# Patient Record
Sex: Male | Born: 1940 | Race: White | Hispanic: No | Marital: Married | Smoking: Never smoker
Health system: Southern US, Community
[De-identification: ages and names within clinical notes are randomized; demographics above are authoritative.]

## PROBLEM LIST (undated history)

## (undated) DIAGNOSIS — Z87442 Personal history of urinary calculi: Secondary | ICD-10-CM

## (undated) DIAGNOSIS — I10 Essential (primary) hypertension: Secondary | ICD-10-CM

## (undated) DIAGNOSIS — M1990 Unspecified osteoarthritis, unspecified site: Secondary | ICD-10-CM

## (undated) DIAGNOSIS — E119 Type 2 diabetes mellitus without complications: Secondary | ICD-10-CM

## (undated) DIAGNOSIS — I499 Cardiac arrhythmia, unspecified: Secondary | ICD-10-CM

## (undated) HISTORY — PX: TONSILLECTOMY: SUR1361

## (undated) HISTORY — PX: EYE SURGERY: SHX253

---

## 2016-03-25 DIAGNOSIS — E1165 Type 2 diabetes mellitus with hyperglycemia: Secondary | ICD-10-CM | POA: Diagnosis not present

## 2016-03-25 DIAGNOSIS — E785 Hyperlipidemia, unspecified: Secondary | ICD-10-CM | POA: Diagnosis not present

## 2016-03-25 DIAGNOSIS — I10 Essential (primary) hypertension: Secondary | ICD-10-CM | POA: Diagnosis not present

## 2016-04-01 DIAGNOSIS — E785 Hyperlipidemia, unspecified: Secondary | ICD-10-CM | POA: Diagnosis not present

## 2016-04-01 DIAGNOSIS — I10 Essential (primary) hypertension: Secondary | ICD-10-CM | POA: Diagnosis not present

## 2016-04-01 DIAGNOSIS — E1165 Type 2 diabetes mellitus with hyperglycemia: Secondary | ICD-10-CM | POA: Diagnosis not present

## 2016-07-15 DIAGNOSIS — M2242 Chondromalacia patellae, left knee: Secondary | ICD-10-CM | POA: Diagnosis not present

## 2016-07-15 DIAGNOSIS — M170 Bilateral primary osteoarthritis of knee: Secondary | ICD-10-CM | POA: Diagnosis not present

## 2016-07-15 DIAGNOSIS — M2241 Chondromalacia patellae, right knee: Secondary | ICD-10-CM | POA: Diagnosis not present

## 2016-07-17 DIAGNOSIS — E1165 Type 2 diabetes mellitus with hyperglycemia: Secondary | ICD-10-CM | POA: Diagnosis not present

## 2016-07-17 DIAGNOSIS — R42 Dizziness and giddiness: Secondary | ICD-10-CM | POA: Diagnosis not present

## 2016-07-17 DIAGNOSIS — I10 Essential (primary) hypertension: Secondary | ICD-10-CM | POA: Diagnosis not present

## 2016-07-31 DIAGNOSIS — E785 Hyperlipidemia, unspecified: Secondary | ICD-10-CM | POA: Diagnosis not present

## 2016-07-31 DIAGNOSIS — E1165 Type 2 diabetes mellitus with hyperglycemia: Secondary | ICD-10-CM | POA: Diagnosis not present

## 2016-07-31 DIAGNOSIS — Z23 Encounter for immunization: Secondary | ICD-10-CM | POA: Diagnosis not present

## 2016-07-31 DIAGNOSIS — R8299 Other abnormal findings in urine: Secondary | ICD-10-CM | POA: Diagnosis not present

## 2016-07-31 DIAGNOSIS — R319 Hematuria, unspecified: Secondary | ICD-10-CM | POA: Diagnosis not present

## 2016-07-31 DIAGNOSIS — Z7189 Other specified counseling: Secondary | ICD-10-CM | POA: Diagnosis not present

## 2016-07-31 DIAGNOSIS — Z0000 Encounter for general adult medical examination without abnormal findings: Secondary | ICD-10-CM | POA: Diagnosis not present

## 2016-07-31 DIAGNOSIS — Z125 Encounter for screening for malignant neoplasm of prostate: Secondary | ICD-10-CM | POA: Diagnosis not present

## 2016-07-31 DIAGNOSIS — I10 Essential (primary) hypertension: Secondary | ICD-10-CM | POA: Diagnosis not present

## 2016-08-07 DIAGNOSIS — D72829 Elevated white blood cell count, unspecified: Secondary | ICD-10-CM | POA: Diagnosis not present

## 2016-08-12 DIAGNOSIS — M2241 Chondromalacia patellae, right knee: Secondary | ICD-10-CM | POA: Diagnosis not present

## 2016-08-12 DIAGNOSIS — M170 Bilateral primary osteoarthritis of knee: Secondary | ICD-10-CM | POA: Diagnosis not present

## 2016-08-12 DIAGNOSIS — M2242 Chondromalacia patellae, left knee: Secondary | ICD-10-CM | POA: Diagnosis not present

## 2016-08-19 DIAGNOSIS — M170 Bilateral primary osteoarthritis of knee: Secondary | ICD-10-CM | POA: Diagnosis not present

## 2016-08-19 DIAGNOSIS — M2242 Chondromalacia patellae, left knee: Secondary | ICD-10-CM | POA: Diagnosis not present

## 2016-08-19 DIAGNOSIS — M2241 Chondromalacia patellae, right knee: Secondary | ICD-10-CM | POA: Diagnosis not present

## 2016-08-26 DIAGNOSIS — M2241 Chondromalacia patellae, right knee: Secondary | ICD-10-CM | POA: Diagnosis not present

## 2016-08-26 DIAGNOSIS — M2242 Chondromalacia patellae, left knee: Secondary | ICD-10-CM | POA: Diagnosis not present

## 2016-08-26 DIAGNOSIS — M170 Bilateral primary osteoarthritis of knee: Secondary | ICD-10-CM | POA: Diagnosis not present

## 2016-10-07 DIAGNOSIS — M2242 Chondromalacia patellae, left knee: Secondary | ICD-10-CM | POA: Diagnosis not present

## 2016-10-07 DIAGNOSIS — M2241 Chondromalacia patellae, right knee: Secondary | ICD-10-CM | POA: Diagnosis not present

## 2016-10-07 DIAGNOSIS — M170 Bilateral primary osteoarthritis of knee: Secondary | ICD-10-CM | POA: Diagnosis not present

## 2016-10-30 DIAGNOSIS — E1165 Type 2 diabetes mellitus with hyperglycemia: Secondary | ICD-10-CM | POA: Diagnosis not present

## 2016-10-30 DIAGNOSIS — I10 Essential (primary) hypertension: Secondary | ICD-10-CM | POA: Diagnosis not present

## 2016-10-30 DIAGNOSIS — E785 Hyperlipidemia, unspecified: Secondary | ICD-10-CM | POA: Diagnosis not present

## 2016-11-11 DIAGNOSIS — E1165 Type 2 diabetes mellitus with hyperglycemia: Secondary | ICD-10-CM | POA: Diagnosis not present

## 2016-11-11 DIAGNOSIS — I10 Essential (primary) hypertension: Secondary | ICD-10-CM | POA: Diagnosis not present

## 2016-11-11 DIAGNOSIS — E785 Hyperlipidemia, unspecified: Secondary | ICD-10-CM | POA: Diagnosis not present

## 2017-03-05 DIAGNOSIS — E1165 Type 2 diabetes mellitus with hyperglycemia: Secondary | ICD-10-CM | POA: Diagnosis not present

## 2017-03-05 DIAGNOSIS — E785 Hyperlipidemia, unspecified: Secondary | ICD-10-CM | POA: Diagnosis not present

## 2017-03-05 DIAGNOSIS — I10 Essential (primary) hypertension: Secondary | ICD-10-CM | POA: Diagnosis not present

## 2017-04-30 DIAGNOSIS — G8929 Other chronic pain: Secondary | ICD-10-CM | POA: Diagnosis not present

## 2017-04-30 DIAGNOSIS — M2341 Loose body in knee, right knee: Secondary | ICD-10-CM | POA: Diagnosis not present

## 2017-04-30 DIAGNOSIS — M170 Bilateral primary osteoarthritis of knee: Secondary | ICD-10-CM | POA: Diagnosis not present

## 2017-04-30 DIAGNOSIS — M2342 Loose body in knee, left knee: Secondary | ICD-10-CM | POA: Diagnosis not present

## 2017-05-20 DIAGNOSIS — I10 Essential (primary) hypertension: Secondary | ICD-10-CM | POA: Diagnosis not present

## 2017-05-20 DIAGNOSIS — Z9181 History of falling: Secondary | ICD-10-CM | POA: Diagnosis not present

## 2017-05-20 DIAGNOSIS — M170 Bilateral primary osteoarthritis of knee: Secondary | ICD-10-CM | POA: Diagnosis not present

## 2017-05-20 DIAGNOSIS — Z1389 Encounter for screening for other disorder: Secondary | ICD-10-CM | POA: Diagnosis not present

## 2017-05-20 DIAGNOSIS — E119 Type 2 diabetes mellitus without complications: Secondary | ICD-10-CM | POA: Diagnosis not present

## 2017-05-20 DIAGNOSIS — Z800 Family history of malignant neoplasm of digestive organs: Secondary | ICD-10-CM | POA: Diagnosis not present

## 2017-05-20 DIAGNOSIS — E785 Hyperlipidemia, unspecified: Secondary | ICD-10-CM | POA: Diagnosis not present

## 2017-05-20 DIAGNOSIS — Z6831 Body mass index (BMI) 31.0-31.9, adult: Secondary | ICD-10-CM | POA: Diagnosis not present

## 2017-05-22 DIAGNOSIS — E119 Type 2 diabetes mellitus without complications: Secondary | ICD-10-CM | POA: Diagnosis not present

## 2017-05-22 DIAGNOSIS — Z125 Encounter for screening for malignant neoplasm of prostate: Secondary | ICD-10-CM | POA: Diagnosis not present

## 2017-05-22 DIAGNOSIS — E785 Hyperlipidemia, unspecified: Secondary | ICD-10-CM | POA: Diagnosis not present

## 2017-06-10 DIAGNOSIS — H18893 Other specified disorders of cornea, bilateral: Secondary | ICD-10-CM | POA: Diagnosis not present

## 2017-06-10 DIAGNOSIS — Z961 Presence of intraocular lens: Secondary | ICD-10-CM | POA: Diagnosis not present

## 2017-06-10 DIAGNOSIS — E113293 Type 2 diabetes mellitus with mild nonproliferative diabetic retinopathy without macular edema, bilateral: Secondary | ICD-10-CM | POA: Diagnosis not present

## 2017-06-23 DIAGNOSIS — E119 Type 2 diabetes mellitus without complications: Secondary | ICD-10-CM | POA: Diagnosis not present

## 2017-06-23 DIAGNOSIS — Z1211 Encounter for screening for malignant neoplasm of colon: Secondary | ICD-10-CM | POA: Diagnosis not present

## 2017-06-23 DIAGNOSIS — K529 Noninfective gastroenteritis and colitis, unspecified: Secondary | ICD-10-CM | POA: Diagnosis not present

## 2017-06-23 DIAGNOSIS — Z79899 Other long term (current) drug therapy: Secondary | ICD-10-CM | POA: Diagnosis not present

## 2017-06-23 DIAGNOSIS — D122 Benign neoplasm of ascending colon: Secondary | ICD-10-CM | POA: Diagnosis not present

## 2017-06-23 DIAGNOSIS — I10 Essential (primary) hypertension: Secondary | ICD-10-CM | POA: Diagnosis not present

## 2017-06-23 DIAGNOSIS — Z800 Family history of malignant neoplasm of digestive organs: Secondary | ICD-10-CM | POA: Diagnosis not present

## 2017-08-24 DIAGNOSIS — E1165 Type 2 diabetes mellitus with hyperglycemia: Secondary | ICD-10-CM | POA: Diagnosis not present

## 2017-08-24 DIAGNOSIS — I10 Essential (primary) hypertension: Secondary | ICD-10-CM | POA: Diagnosis not present

## 2017-08-24 DIAGNOSIS — Z6831 Body mass index (BMI) 31.0-31.9, adult: Secondary | ICD-10-CM | POA: Diagnosis not present

## 2017-08-24 DIAGNOSIS — M170 Bilateral primary osteoarthritis of knee: Secondary | ICD-10-CM | POA: Diagnosis not present

## 2017-08-24 DIAGNOSIS — E785 Hyperlipidemia, unspecified: Secondary | ICD-10-CM | POA: Diagnosis not present

## 2017-08-24 DIAGNOSIS — Z9181 History of falling: Secondary | ICD-10-CM | POA: Diagnosis not present

## 2017-09-04 DIAGNOSIS — H26493 Other secondary cataract, bilateral: Secondary | ICD-10-CM | POA: Diagnosis not present

## 2017-09-07 DIAGNOSIS — I10 Essential (primary) hypertension: Secondary | ICD-10-CM | POA: Diagnosis not present

## 2017-09-07 DIAGNOSIS — Z6831 Body mass index (BMI) 31.0-31.9, adult: Secondary | ICD-10-CM | POA: Diagnosis not present

## 2017-09-07 DIAGNOSIS — Z23 Encounter for immunization: Secondary | ICD-10-CM | POA: Diagnosis not present

## 2017-09-15 ENCOUNTER — Other Ambulatory Visit: Payer: Self-pay | Admitting: Orthopedic Surgery

## 2017-10-05 DIAGNOSIS — I10 Essential (primary) hypertension: Secondary | ICD-10-CM | POA: Diagnosis not present

## 2017-10-05 DIAGNOSIS — Z6831 Body mass index (BMI) 31.0-31.9, adult: Secondary | ICD-10-CM | POA: Diagnosis not present

## 2017-10-26 DIAGNOSIS — I10 Essential (primary) hypertension: Secondary | ICD-10-CM | POA: Diagnosis not present

## 2017-11-03 DIAGNOSIS — Z125 Encounter for screening for malignant neoplasm of prostate: Secondary | ICD-10-CM | POA: Diagnosis not present

## 2017-11-03 DIAGNOSIS — Z0000 Encounter for general adult medical examination without abnormal findings: Secondary | ICD-10-CM | POA: Diagnosis not present

## 2017-11-03 DIAGNOSIS — E785 Hyperlipidemia, unspecified: Secondary | ICD-10-CM | POA: Diagnosis not present

## 2017-11-03 DIAGNOSIS — Z6830 Body mass index (BMI) 30.0-30.9, adult: Secondary | ICD-10-CM | POA: Diagnosis not present

## 2017-11-03 DIAGNOSIS — Z136 Encounter for screening for cardiovascular disorders: Secondary | ICD-10-CM | POA: Diagnosis not present

## 2017-11-03 DIAGNOSIS — Z9181 History of falling: Secondary | ICD-10-CM | POA: Diagnosis not present

## 2017-11-03 DIAGNOSIS — Z23 Encounter for immunization: Secondary | ICD-10-CM | POA: Diagnosis not present

## 2017-11-03 DIAGNOSIS — Z1331 Encounter for screening for depression: Secondary | ICD-10-CM | POA: Diagnosis not present

## 2017-11-16 ENCOUNTER — Other Ambulatory Visit (HOSPITAL_COMMUNITY): Payer: Self-pay | Admitting: *Deleted

## 2017-11-16 NOTE — Pre-Procedure Instructions (Signed)
Johnathan Barker  11/16/2017    Your procedure is scheduled on Monday, November 30, 2017 at 8:15 AM.   Report to [facility] Entrance "A" Admitting Office at 6:15 AM.   Call this number if you have problems the morning of surgery: [phone]   Questions prior to day of surgery, please call [phone] between 8 & 4 PM.   Remember:  Do not eat food or drink liquids after midnight Sunday, 11/29/17.  Take these medicines the morning of surgery with A SIP OF WATER: Amlodipine (Norvasc), Metoprolol (Toprol XL)  Stop NSAIDS (Ibuprofen, Aleve, etc), Fish Oil and Multivitamins 7 days prior to surgery. Do not use Aspirin products 7 days prior to surgery.  Do not take your Metformin and Glimepiride the morning of surgery.   How to Manage Your Diabetes Before Surgery   Why is it important to control my blood sugar before and after surgery?   Improving blood sugar levels before and after surgery helps healing and can limit problems.  A way of improving blood sugar control is eating a healthy diet by:  - Eating less sugar and carbohydrates  - Increasing activity/exercise  - Talk with your doctor about reaching your blood sugar goals  High blood sugars (greater than 180 mg/dL) can raise your risk of infections and slow down your recovery so you will need to focus on controlling your diabetes during the weeks before surgery.  Make sure that the doctor who takes care of your diabetes knows about your planned surgery including the date and location.  How do I manage my blood sugars before surgery?   Check your blood sugar at least 4 times a day, 2 days before surgery to make sure that they are not too high or low.  Check your blood sugar the morning of your surgery when you wake up and every 2 hours until you get to the Short-Stay unit.  Treat a low blood sugar (less than 70 mg/dL) with 1/2 cup of clear juice (cranberry or apple), 4 glucose tablets, OR glucose gel.  Recheck blood  sugar in 15 minutes after treatment (to make sure it is greater than 70 mg/dL).  If blood sugar is not greater than 70 mg/dL on re-check, call [phone] for further instructions.   Report your blood sugar to the Short-Stay nurse when you get to Short-Stay.  References:  [facility], 2007 "How to Manage your Diabetes Before and After Surgery".   Do not wear jewelry.  Do not wear lotions, powders, cologne or deodorant.  Men may shave face and neck.  Do not bring valuables to the hospital.  [facility] is not responsible for any belongings or valuables.  Contacts, dentures or bridgework may not be worn into surgery.  Leave your suitcase in the car.  After surgery it may be brought to your room.  For patients admitted to the hospital, discharge time will be determined by your treatment team.  [facility] - Preparing for Surgery  Before surgery, you can play an important role.  Because skin is not sterile, your skin needs to be as free of germs as possible.  You can reduce the number of germs on you skin by washing with CHG (chlorahexidine gluconate) soap before surgery.  CHG is an antiseptic cleaner which kills germs and bonds with the skin to continue killing germs even after washing.  Please DO NOT use if you have an allergy to CHG or antibacterial soaps.  If your  skin becomes reddened/irritated stop using the CHG and inform your nurse when you arrive at Short Stay.  Do not shave (including legs and underarms) for at least 48 hours prior to the first CHG shower.  You may shave your face.  Please follow these instructions carefully:   1.  Shower with CHG Soap the night before surgery and the                    morning of Surgery.  2.  If you choose to wash your hair, wash your hair first as usual with your       normal shampoo.  3.  After you shampoo, rinse your hair and body thoroughly to remove the shampoo.  4.  Use CHG as you would any other liquid soap.   You can apply chg directly       to the skin and wash gently with scrungie or a clean washcloth.  5.  Apply the CHG Soap to your body ONLY FROM THE NECK DOWN.        Do not use on open wounds or open sores.  Avoid contact with your eyes, ears, mouth and genitals (private parts).  Wash genitals (private parts) with your normal soap.  6.  Wash thoroughly, paying special attention to the area where your surgery        will be performed.  7.  Thoroughly rinse your body with warm water from the neck down.  8.  DO NOT shower/wash with your normal soap after using and rinsing off       the CHG Soap.  9.  Pat yourself dry with a clean towel.            10.  Wear clean pajamas.            11.  Place clean sheets on your bed the night of your first shower and do not        sleep with pets.  Day of Surgery  Do not apply any lotions/deodorants the morning of surgery.  Please wear clean clothes to the hospital.   Please read over the fact sheets that you were given.

## 2017-11-18 ENCOUNTER — Encounter (HOSPITAL_COMMUNITY): Payer: Self-pay

## 2017-11-18 ENCOUNTER — Encounter (HOSPITAL_COMMUNITY)
Admission: RE | Admit: 2017-11-18 | Discharge: 2017-11-18 | Disposition: A | Discharge status: Home / Self Care | Payer: Medicare Other | Source: Ambulatory Visit | Attending: Orthopedic Surgery | Admitting: Orthopedic Surgery

## 2017-11-18 ENCOUNTER — Other Ambulatory Visit: Payer: Self-pay

## 2017-11-18 DIAGNOSIS — M1711 Unilateral primary osteoarthritis, right knee: Secondary | ICD-10-CM | POA: Diagnosis not present

## 2017-11-18 DIAGNOSIS — Z01812 Encounter for preprocedural laboratory examination: Secondary | ICD-10-CM | POA: Diagnosis not present

## 2017-11-18 HISTORY — DX: Unspecified osteoarthritis, unspecified site: M19.90

## 2017-11-18 HISTORY — DX: Personal history of urinary calculi: Z87.442

## 2017-11-18 HISTORY — DX: Cardiac arrhythmia, unspecified: I49.9

## 2017-11-18 HISTORY — DX: Type 2 diabetes mellitus without complications: E11.9

## 2017-11-18 HISTORY — DX: Essential (primary) hypertension: I10

## 2017-11-18 LAB — CBC WITH DIFFERENTIAL/PLATELET
Basophils Absolute: 0 10*3/uL (ref 0.0–0.1)
Basophils Relative: 0 %
EOS PCT: 6 %
Eosinophils Absolute: 0.6 10*3/uL (ref 0.0–0.7)
HCT: 38.7 % — ABNORMAL LOW (ref 39.0–52.0)
HEMOGLOBIN: 13.4 g/dL (ref 13.0–17.0)
LYMPHS ABS: 2.4 10*3/uL (ref 0.7–4.0)
LYMPHS PCT: 25 %
MCH: 30.7 pg (ref 26.0–34.0)
MCHC: 34.6 g/dL (ref 30.0–36.0)
MCV: 88.8 fL (ref 78.0–100.0)
Monocytes Absolute: 0.9 10*3/uL (ref 0.1–1.0)
Monocytes Relative: 9 %
Neutro Abs: 6 10*3/uL (ref 1.7–7.7)
Neutrophils Relative %: 60 %
PLATELETS: 334 10*3/uL (ref 150–400)
RBC: 4.36 MIL/uL (ref 4.22–5.81)
RDW: 12.9 % (ref 11.5–15.5)
WBC: 9.9 10*3/uL (ref 4.0–10.5)

## 2017-11-18 LAB — COMPREHENSIVE METABOLIC PANEL
ALK PHOS: 75 U/L (ref 38–126)
ALT: 24 U/L (ref 17–63)
AST: 25 U/L (ref 15–41)
Albumin: 3.7 g/dL (ref 3.5–5.0)
Anion gap: 10 (ref 5–15)
BUN: 17 mg/dL (ref 6–20)
CALCIUM: 9.1 mg/dL (ref 8.9–10.3)
CO2: 26 mmol/L (ref 22–32)
CREATININE: 0.96 mg/dL (ref 0.61–1.24)
Chloride: 102 mmol/L (ref 101–111)
Glucose, Bld: 245 mg/dL — ABNORMAL HIGH (ref 65–99)
Potassium: 4 mmol/L (ref 3.5–5.1)
Sodium: 138 mmol/L (ref 135–145)
TOTAL PROTEIN: 7.4 g/dL (ref 6.5–8.1)
Total Bilirubin: 1.4 mg/dL — ABNORMAL HIGH (ref 0.3–1.2)

## 2017-11-18 LAB — HEMOGLOBIN A1C
HEMOGLOBIN A1C: 7.6 % — AB (ref 4.8–5.6)
MEAN PLASMA GLUCOSE: 171.42 mg/dL

## 2017-11-18 LAB — SURGICAL PCR SCREEN
MRSA, PCR: NEGATIVE
Staphylococcus aureus: NEGATIVE

## 2017-11-18 LAB — GLUCOSE, CAPILLARY: Glucose-Capillary: 256 mg/dL — ABNORMAL HIGH (ref 65–99)

## 2017-11-18 NOTE — Progress Notes (Signed)
Requesting EKG pt. reports that he had in PCP office- in the past 4-6 months. Pt. Reports that he has not ever had any cardiac concerns, denies stress test etc. In the past. Pt.followed by Dr. Carolanne Grumbling in [facility] group.Chart will be referred to anesth. For his labs values to be reviewed.  Pt. Moved here from [place] in the past year.

## 2017-11-19 NOTE — Progress Notes (Signed)
Anesthesia Chart Review:  Pt is a 76 year old male scheduled for R total knee arthroplasty on 11/30/2016 with Vickey Huger, M.D.  - PCP is Nelda Bucks, MD. Last office visit 10/26/17  PMH includes: HTN, dysrhythmia (unspecified), DM. Never smoker. BMI 30.  Medications include: Amlodipine, Lipitor, glimepiride, losartan-HCTZ, metformin, metoprolol  BP (!) 161/71   Pulse 66   Temp 36.5 C (Oral)   Resp 18   Ht 6' (1.829 m)   Wt 221 lb 9 oz (100.5 kg)   SpO2 99%   BMI 30.05 kg/m   Preoperative labs reviewed.   - HbA1c 7.6, glucose 245  EKG 05/20/17 (Dr. Denton Lank office): Sinus rhythm. Intra-atrial conduction delay  If no changes, I anticipate pt can proceed with surgery as scheduled.    Willeen Cass, FNP-BC [facility]/Anesthesiology Phone: [phone] 11/19/2017 9:51 AM

## 2017-11-27 DIAGNOSIS — Z6830 Body mass index (BMI) 30.0-30.9, adult: Secondary | ICD-10-CM | POA: Diagnosis not present

## 2017-11-27 DIAGNOSIS — E1165 Type 2 diabetes mellitus with hyperglycemia: Secondary | ICD-10-CM | POA: Diagnosis not present

## 2017-11-27 DIAGNOSIS — E785 Hyperlipidemia, unspecified: Secondary | ICD-10-CM | POA: Diagnosis not present

## 2017-11-27 DIAGNOSIS — M170 Bilateral primary osteoarthritis of knee: Secondary | ICD-10-CM | POA: Diagnosis not present

## 2017-11-27 DIAGNOSIS — I10 Essential (primary) hypertension: Secondary | ICD-10-CM | POA: Diagnosis not present

## 2017-11-27 MED ORDER — TRANEXAMIC ACID 1000 MG/10ML IV SOLN
1000.0000 mg | INTRAVENOUS | Status: AC
  Administered 2017-11-30: 1000 mg via INTRAVENOUS
  Filled 2017-11-27: qty 10

## 2017-11-27 MED ORDER — ACETAMINOPHEN 500 MG PO TABS
1000.0000 mg | ORAL_TABLET | Freq: Once | ORAL | Status: AC
  Administered 2017-11-30: 1000 mg via ORAL
  Filled 2017-11-27: qty 2

## 2017-11-27 MED ORDER — DEXAMETHASONE SODIUM PHOSPHATE 10 MG/ML IJ SOLN
8.0000 mg | Freq: Once | INTRAMUSCULAR | Status: DC
  Filled 2017-11-27: qty 1

## 2017-11-27 MED ORDER — GABAPENTIN 300 MG PO CAPS
300.0000 mg | ORAL_CAPSULE | Freq: Once | ORAL | Status: AC
  Administered 2017-11-30: 300 mg via ORAL
  Filled 2017-11-27: qty 1

## 2017-11-29 ENCOUNTER — Encounter (HOSPITAL_COMMUNITY): Payer: Self-pay | Admitting: Anesthesiology

## 2017-11-29 MED ORDER — BUPIVACAINE LIPOSOME 1.3 % IJ SUSP
20.0000 mL | INTRAMUSCULAR | Status: AC
  Administered 2017-11-30: 20 mL
  Filled 2017-11-29 (×2): qty 20

## 2017-11-29 NOTE — Anesthesia Preprocedure Evaluation (Addendum)
Anesthesia Evaluation  Patient identified by MRN, date of birth, ID band Patient awake    Reviewed: Allergy & Precautions, NPO status , Patient's Chart, lab work & pertinent test results  Airway Mallampati: I  TM Distance: >3 FB Neck ROM: Full    Dental  (+) Chipped, Poor Dentition, Dental Advisory Given,    Pulmonary neg pulmonary ROS,    breath sounds clear to auscultation       Cardiovascular hypertension, Pt. on medications and Pt. on home beta blockers + dysrhythmias  Rhythm:Regular Rate:Normal     Neuro/Psych negative neurological ROS  negative psych ROS   GI/Hepatic negative GI ROS, Neg liver ROS,   Endo/Other  diabetes, Type 2, Oral Hypoglycemic Agents  Renal/GU negative Renal ROS  negative genitourinary   Musculoskeletal  (+) Arthritis , Osteoarthritis,    Abdominal Normal abdominal exam  (+)   Peds  Hematology   Anesthesia Other Findings   Reproductive/Obstetrics                          Anesthesia Physical Anesthesia Plan  ASA: II  Anesthesia Plan: Spinal   Post-op Pain Management:  Regional for Post-op pain   Induction: Intravenous  PONV Risk Score and Plan: Ondansetron, Dexamethasone and Propofol infusion  Airway Management Planned: Natural Airway and Nasal Cannula  Additional Equipment: None  Intra-op Plan:   Post-operative Plan:   Informed Consent: I have reviewed the patients History and Physical, chart, labs and discussed the procedure including the risks, benefits and alternatives for the proposed anesthesia with the patient or authorized representative who has indicated his/her understanding and acceptance.     Plan Discussed with: CRNA  Anesthesia Plan Comments:         Anesthesia Quick Evaluation

## 2017-11-30 ENCOUNTER — Observation Stay (HOSPITAL_COMMUNITY)
Admission: RE | Admit: 2017-11-30 | Discharge: 2017-12-01 | Disposition: A | Discharge status: Home / Self Care | Payer: Medicare Other | Source: Ambulatory Visit | Attending: Orthopedic Surgery | Admitting: Orthopedic Surgery

## 2017-11-30 ENCOUNTER — Encounter (HOSPITAL_COMMUNITY)
Admission: RE | Disposition: A | Discharge status: Home / Self Care | Payer: Self-pay | Source: Ambulatory Visit | Attending: Orthopedic Surgery

## 2017-11-30 ENCOUNTER — Encounter (HOSPITAL_COMMUNITY): Payer: Self-pay

## 2017-11-30 ENCOUNTER — Other Ambulatory Visit: Payer: Self-pay

## 2017-11-30 ENCOUNTER — Ambulatory Visit (HOSPITAL_COMMUNITY): Payer: Medicare Other | Admitting: Emergency Medicine

## 2017-11-30 ENCOUNTER — Ambulatory Visit (HOSPITAL_COMMUNITY): Payer: Medicare Other | Admitting: Anesthesiology

## 2017-11-30 DIAGNOSIS — Z87442 Personal history of urinary calculi: Secondary | ICD-10-CM | POA: Diagnosis not present

## 2017-11-30 DIAGNOSIS — Z7982 Long term (current) use of aspirin: Secondary | ICD-10-CM | POA: Diagnosis not present

## 2017-11-30 DIAGNOSIS — Z7984 Long term (current) use of oral hypoglycemic drugs: Secondary | ICD-10-CM | POA: Diagnosis not present

## 2017-11-30 DIAGNOSIS — E119 Type 2 diabetes mellitus without complications: Secondary | ICD-10-CM | POA: Diagnosis not present

## 2017-11-30 DIAGNOSIS — G8918 Other acute postprocedural pain: Secondary | ICD-10-CM | POA: Diagnosis not present

## 2017-11-30 DIAGNOSIS — M1711 Unilateral primary osteoarthritis, right knee: Principal | ICD-10-CM | POA: Insufficient documentation

## 2017-11-30 DIAGNOSIS — Z880 Allergy status to penicillin: Secondary | ICD-10-CM | POA: Diagnosis not present

## 2017-11-30 DIAGNOSIS — Z96659 Presence of unspecified artificial knee joint: Secondary | ICD-10-CM

## 2017-11-30 DIAGNOSIS — Z79899 Other long term (current) drug therapy: Secondary | ICD-10-CM | POA: Insufficient documentation

## 2017-11-30 DIAGNOSIS — I10 Essential (primary) hypertension: Secondary | ICD-10-CM | POA: Insufficient documentation

## 2017-11-30 DIAGNOSIS — I499 Cardiac arrhythmia, unspecified: Secondary | ICD-10-CM | POA: Diagnosis not present

## 2017-11-30 HISTORY — PX: TOTAL KNEE ARTHROPLASTY: SHX125

## 2017-11-30 LAB — GLUCOSE, CAPILLARY
Glucose-Capillary: 91 mg/dL (ref 65–99)
Glucose-Capillary: 100 mg/dL — ABNORMAL HIGH (ref 65–99)
Glucose-Capillary: 296 mg/dL — ABNORMAL HIGH (ref 65–99)

## 2017-11-30 SURGERY — ARTHROPLASTY, KNEE, TOTAL
Anesthesia: Spinal | Site: Knee | Laterality: Right

## 2017-11-30 MED ORDER — ACETAMINOPHEN 325 MG PO TABS: 650.0000 mg | ORAL_TABLET | ORAL | Status: DC | PRN

## 2017-11-30 MED ORDER — MEPERIDINE HCL 25 MG/ML IJ SOLN: 6.2500 mg | INTRAMUSCULAR | Status: DC | PRN

## 2017-11-30 MED ORDER — BUPIVACAINE-EPINEPHRINE (PF) 0.25% -1:200000 IJ SOLN
INTRAMUSCULAR | Status: DC | PRN
  Administered 2017-11-30: 30 mL

## 2017-11-30 MED ORDER — METOPROLOL SUCCINATE ER 50 MG PO TB24
50.0000 mg | ORAL_TABLET | Freq: Every day | ORAL | Status: DC
  Administered 2017-12-01: 50 mg via ORAL
  Filled 2017-11-30: qty 1

## 2017-11-30 MED ORDER — FLEET ENEMA 7-19 GM/118ML RE ENEM: 1.0000 | ENEMA | Freq: Once | RECTAL | Status: DC | PRN

## 2017-11-30 MED ORDER — HYDROCODONE-ACETAMINOPHEN 7.5-325 MG PO TABS
ORAL_TABLET | ORAL | Status: AC
  Filled 2017-11-30: qty 1

## 2017-11-30 MED ORDER — FENTANYL CITRATE (PF) 250 MCG/5ML IJ SOLN
INTRAMUSCULAR | Status: AC
  Filled 2017-11-30: qty 5

## 2017-11-30 MED ORDER — LACTATED RINGERS IV SOLN: INTRAVENOUS | Status: DC

## 2017-11-30 MED ORDER — PROMETHAZINE HCL 25 MG/ML IJ SOLN: 6.2500 mg | INTRAMUSCULAR | Status: DC | PRN

## 2017-11-30 MED ORDER — ATORVASTATIN CALCIUM 10 MG PO TABS
10.0000 mg | ORAL_TABLET | Freq: Every day | ORAL | Status: DC
  Administered 2017-11-30 – 2017-12-01 (×2): 10 mg via ORAL
  Filled 2017-11-30 (×2): qty 1

## 2017-11-30 MED ORDER — DEXAMETHASONE SODIUM PHOSPHATE 10 MG/ML IJ SOLN
10.0000 mg | Freq: Once | INTRAMUSCULAR | Status: AC
  Administered 2017-12-01: 10 mg via INTRAVENOUS
  Filled 2017-11-30: qty 1

## 2017-11-30 MED ORDER — DEXAMETHASONE SODIUM PHOSPHATE 10 MG/ML IJ SOLN
INTRAMUSCULAR | Status: DC | PRN
  Administered 2017-11-30: 10 mg via INTRAVENOUS

## 2017-11-30 MED ORDER — LOSARTAN POTASSIUM-HCTZ 100-25 MG PO TABS: 1.0000 | ORAL_TABLET | Freq: Every day | ORAL | Status: DC

## 2017-11-30 MED ORDER — METHOCARBAMOL 500 MG PO TABS
500.0000 mg | ORAL_TABLET | Freq: Four times a day (QID) | ORAL | Status: DC | PRN
Start: 2017-11-30 — End: 2017-12-01
  Administered 2017-11-30: 500 mg via ORAL

## 2017-11-30 MED ORDER — GABAPENTIN 300 MG PO CAPS
300.0000 mg | ORAL_CAPSULE | Freq: Three times a day (TID) | ORAL | Status: DC
  Administered 2017-11-30 – 2017-12-01 (×2): 300 mg via ORAL
  Filled 2017-11-30 (×2): qty 1

## 2017-11-30 MED ORDER — METOCLOPRAMIDE HCL 5 MG PO TABS: 5.0000 mg | ORAL_TABLET | Freq: Three times a day (TID) | ORAL | Status: DC | PRN

## 2017-11-30 MED ORDER — LOSARTAN POTASSIUM 50 MG PO TABS
100.0000 mg | ORAL_TABLET | Freq: Every day | ORAL | Status: DC
  Administered 2017-11-30 – 2017-12-01 (×2): 100 mg via ORAL
  Filled 2017-11-30 (×2): qty 2

## 2017-11-30 MED ORDER — PHENOL 1.4 % MT LIQD: 1.0000 | OROMUCOSAL | Status: DC | PRN

## 2017-11-30 MED ORDER — ONDANSETRON HCL 4 MG PO TABS
4.0000 mg | ORAL_TABLET | Freq: Four times a day (QID) | ORAL | Status: DC | PRN
Start: 2017-11-30 — End: 2017-12-01

## 2017-11-30 MED ORDER — ONDANSETRON HCL 4 MG/2ML IJ SOLN
INTRAMUSCULAR | Status: AC
  Filled 2017-11-30: qty 2

## 2017-11-30 MED ORDER — ASPIRIN EC 325 MG PO TBEC
325.0000 mg | DELAYED_RELEASE_TABLET | Freq: Two times a day (BID) | ORAL | Status: DC
  Administered 2017-11-30 – 2017-12-01 (×2): 325 mg via ORAL
  Filled 2017-11-30 (×2): qty 1

## 2017-11-30 MED ORDER — PROPOFOL 10 MG/ML IV BOLUS
INTRAVENOUS | Status: AC
Start: 2017-11-30 — End: ?
  Filled 2017-11-30: qty 20

## 2017-11-30 MED ORDER — LIDOCAINE 2% (20 MG/ML) 5 ML SYRINGE
INTRAMUSCULAR | Status: AC
  Filled 2017-11-30: qty 5

## 2017-11-30 MED ORDER — AMLODIPINE BESYLATE 5 MG PO TABS
5.0000 mg | ORAL_TABLET | Freq: Every day | ORAL | Status: DC
  Administered 2017-12-01: 5 mg via ORAL
  Filled 2017-11-30 (×2): qty 1

## 2017-11-30 MED ORDER — SODIUM CHLORIDE 0.9 % IR SOLN
Status: DC | PRN
  Administered 2017-11-30: 3000 mL

## 2017-11-30 MED ORDER — BISACODYL 5 MG PO TBEC: 5.0000 mg | DELAYED_RELEASE_TABLET | Freq: Every day | ORAL | Status: DC | PRN

## 2017-11-30 MED ORDER — ONDANSETRON HCL 4 MG/2ML IJ SOLN: 4.0000 mg | Freq: Four times a day (QID) | INTRAMUSCULAR | Status: DC | PRN

## 2017-11-30 MED ORDER — HYDROMORPHONE HCL 1 MG/ML IJ SOLN: 0.2500 mg | INTRAMUSCULAR | Status: DC | PRN

## 2017-11-30 MED ORDER — PROPOFOL 500 MG/50ML IV EMUL
INTRAVENOUS | Status: DC | PRN
  Administered 2017-11-30: 75 ug/kg/min via INTRAVENOUS

## 2017-11-30 MED ORDER — DOCUSATE SODIUM 100 MG PO CAPS
100.0000 mg | ORAL_CAPSULE | Freq: Two times a day (BID) | ORAL | Status: DC
  Administered 2017-11-30 – 2017-12-01 (×2): 100 mg via ORAL
  Filled 2017-11-30 (×2): qty 1

## 2017-11-30 MED ORDER — HYDROCODONE-ACETAMINOPHEN 7.5-325 MG PO TABS
1.0000 | ORAL_TABLET | Freq: Four times a day (QID) | ORAL | Status: DC
  Administered 2017-11-30 – 2017-12-01 (×5): 1 via ORAL
  Filled 2017-11-30 (×4): qty 1

## 2017-11-30 MED ORDER — CHLORHEXIDINE GLUCONATE 4 % EX LIQD: 60.0000 mL | Freq: Once | CUTANEOUS | Status: DC

## 2017-11-30 MED ORDER — METFORMIN HCL 500 MG PO TABS
1000.0000 mg | ORAL_TABLET | Freq: Two times a day (BID) | ORAL | Status: DC
  Administered 2017-11-30 – 2017-12-01 (×2): 1000 mg via ORAL
  Filled 2017-11-30 (×2): qty 2

## 2017-11-30 MED ORDER — CLINDAMYCIN PHOSPHATE 600 MG/50ML IV SOLN
600.0000 mg | Freq: Four times a day (QID) | INTRAVENOUS | Status: AC
  Administered 2017-11-30 – 2017-12-01 (×2): 600 mg via INTRAVENOUS
  Filled 2017-11-30 (×2): qty 50

## 2017-11-30 MED ORDER — SODIUM CHLORIDE 0.9 % IJ SOLN
INTRAMUSCULAR | Status: DC | PRN
  Administered 2017-11-30: 20 mL

## 2017-11-30 MED ORDER — HYDROCHLOROTHIAZIDE 25 MG PO TABS
25.0000 mg | ORAL_TABLET | Freq: Every day | ORAL | Status: DC
  Administered 2017-11-30 – 2017-12-01 (×2): 25 mg via ORAL
  Filled 2017-11-30 (×2): qty 1

## 2017-11-30 MED ORDER — OXYCODONE HCL 5 MG PO TABS
5.0000 mg | ORAL_TABLET | ORAL | Status: DC | PRN
  Administered 2017-11-30: 5 mg via ORAL
  Filled 2017-11-30: qty 1

## 2017-11-30 MED ORDER — FENTANYL CITRATE (PF) 100 MCG/2ML IJ SOLN
INTRAMUSCULAR | Status: DC | PRN
  Administered 2017-11-30: 50 ug via INTRAVENOUS

## 2017-11-30 MED ORDER — ZOLPIDEM TARTRATE 5 MG PO TABS: 5.0000 mg | ORAL_TABLET | Freq: Every evening | ORAL | Status: DC | PRN

## 2017-11-30 MED ORDER — CELECOXIB 200 MG PO CAPS
200.0000 mg | ORAL_CAPSULE | Freq: Two times a day (BID) | ORAL | Status: DC
  Administered 2017-11-30 – 2017-12-01 (×2): 200 mg via ORAL
  Filled 2017-11-30 (×2): qty 1

## 2017-11-30 MED ORDER — BUPIVACAINE IN DEXTROSE 0.75-8.25 % IT SOLN
INTRATHECAL | Status: DC | PRN
  Administered 2017-11-30: 2 mL via INTRATHECAL

## 2017-11-30 MED ORDER — FENTANYL CITRATE (PF) 100 MCG/2ML IJ SOLN
INTRAMUSCULAR | Status: AC
  Administered 2017-11-30: 50 ug via INTRAVENOUS
  Filled 2017-11-30: qty 2

## 2017-11-30 MED ORDER — ROPIVACAINE HCL 5 MG/ML IJ SOLN
INTRAMUSCULAR | Status: DC | PRN
  Administered 2017-11-30: 30 mL via PERINEURAL

## 2017-11-30 MED ORDER — METHOCARBAMOL 1000 MG/10ML IJ SOLN
500.0000 mg | Freq: Four times a day (QID) | INTRAVENOUS | Status: DC | PRN
  Filled 2017-11-30: qty 5

## 2017-11-30 MED ORDER — BUPIVACAINE-EPINEPHRINE (PF) 0.25% -1:200000 IJ SOLN
INTRAMUSCULAR | Status: AC
  Filled 2017-11-30: qty 30

## 2017-11-30 MED ORDER — METHOCARBAMOL 500 MG PO TABS
ORAL_TABLET | ORAL | Status: AC
  Filled 2017-11-30: qty 1

## 2017-11-30 MED ORDER — SENNOSIDES-DOCUSATE SODIUM 8.6-50 MG PO TABS: 1.0000 | ORAL_TABLET | Freq: Every evening | ORAL | Status: DC | PRN

## 2017-11-30 MED ORDER — GLIMEPIRIDE 4 MG PO TABS
4.0000 mg | ORAL_TABLET | Freq: Two times a day (BID) | ORAL | Status: DC
  Administered 2017-11-30 – 2017-12-01 (×2): 4 mg via ORAL
  Filled 2017-11-30 (×2): qty 1

## 2017-11-30 MED ORDER — MENTHOL 3 MG MT LOZG: 1.0000 | LOZENGE | OROMUCOSAL | Status: DC | PRN

## 2017-11-30 MED ORDER — MIDAZOLAM HCL 2 MG/2ML IJ SOLN
INTRAMUSCULAR | Status: AC
  Administered 2017-11-30: 2 mg via INTRAVENOUS
  Filled 2017-11-30: qty 2

## 2017-11-30 MED ORDER — DIPHENHYDRAMINE HCL 12.5 MG/5ML PO ELIX: 12.5000 mg | ORAL_SOLUTION | ORAL | Status: DC | PRN

## 2017-11-30 MED ORDER — FENTANYL CITRATE (PF) 100 MCG/2ML IJ SOLN
50.0000 ug | Freq: Once | INTRAMUSCULAR | Status: AC
  Administered 2017-11-30: 50 ug via INTRAVENOUS

## 2017-11-30 MED ORDER — ALUM & MAG HYDROXIDE-SIMETH 200-200-20 MG/5ML PO SUSP
30.0000 mL | ORAL | Status: DC | PRN
Start: 2017-11-30 — End: 2017-12-01

## 2017-11-30 MED ORDER — PROPOFOL 10 MG/ML IV BOLUS
INTRAVENOUS | Status: DC | PRN
  Administered 2017-11-30: 30 mg via INTRAVENOUS

## 2017-11-30 MED ORDER — MIDAZOLAM HCL 2 MG/2ML IJ SOLN
2.0000 mg | Freq: Once | INTRAMUSCULAR | Status: AC
  Administered 2017-11-30: 2 mg via INTRAVENOUS

## 2017-11-30 MED ORDER — CLINDAMYCIN PHOSPHATE 900 MG/50ML IV SOLN
INTRAVENOUS | Status: AC
  Filled 2017-11-30: qty 50

## 2017-11-30 MED ORDER — METOCLOPRAMIDE HCL 5 MG/ML IJ SOLN: 5.0000 mg | Freq: Three times a day (TID) | INTRAMUSCULAR | Status: DC | PRN

## 2017-11-30 MED ORDER — ONDANSETRON HCL 4 MG/2ML IJ SOLN
INTRAMUSCULAR | Status: DC | PRN
  Administered 2017-11-30: 4 mg via INTRAVENOUS

## 2017-11-30 MED ORDER — ACETAMINOPHEN 650 MG RE SUPP: 650.0000 mg | RECTAL | Status: DC | PRN

## 2017-11-30 MED ORDER — HYDROMORPHONE HCL 1 MG/ML IJ SOLN: 1.0000 mg | INTRAMUSCULAR | Status: DC | PRN

## 2017-11-30 MED ORDER — CLINDAMYCIN PHOSPHATE 900 MG/50ML IV SOLN
900.0000 mg | Freq: Once | INTRAVENOUS | Status: AC
  Administered 2017-11-30: 900 mg via INTRAVENOUS

## 2017-11-30 MED ORDER — TRANEXAMIC ACID 1000 MG/10ML IV SOLN
1000.0000 mg | Freq: Once | INTRAVENOUS | Status: AC
  Administered 2017-11-30: 1000 mg via INTRAVENOUS
  Filled 2017-11-30: qty 10

## 2017-11-30 MED ORDER — DEXAMETHASONE SODIUM PHOSPHATE 10 MG/ML IJ SOLN
INTRAMUSCULAR | Status: AC
  Filled 2017-11-30: qty 1

## 2017-11-30 MED ORDER — EPHEDRINE 5 MG/ML INJ
INTRAVENOUS | Status: AC
  Filled 2017-11-30: qty 10

## 2017-11-30 MED ORDER — MIDAZOLAM HCL 2 MG/2ML IJ SOLN
INTRAMUSCULAR | Status: AC
  Filled 2017-11-30: qty 2

## 2017-11-30 MED ORDER — LACTATED RINGERS IV SOLN
INTRAVENOUS | Status: DC
  Administered 2017-11-30 (×2): via INTRAVENOUS

## 2017-11-30 SURGICAL SUPPLY — 63 items
BANDAGE ACE 6X5 VEL STRL LF (GAUZE/BANDAGES/DRESSINGS) IMPLANT
BANDAGE ELASTIC 6 VELCRO ST LF (GAUZE/BANDAGES/DRESSINGS) ×3 IMPLANT
BANDAGE ESMARK 6X9 LF (GAUZE/BANDAGES/DRESSINGS) ×1 IMPLANT
BLADE SAGITTAL 13X1.27X60 (BLADE) ×2 IMPLANT
BLADE SAGITTAL 13X1.27X60MM (BLADE) ×1
BLADE SAW SGTL 83.5X18.5 (BLADE) ×3 IMPLANT
BLADE SURG 10 STRL SS (BLADE) ×3 IMPLANT
BNDG ESMARK 6X9 LF (GAUZE/BANDAGES/DRESSINGS) ×3
BOWL SMART MIX CTS (DISPOSABLE) ×3 IMPLANT
CAPT KNEE TOTAL 3 ×3 IMPLANT
CEMENT BONE SIMPLEX SPEEDSET (Cement) ×6 IMPLANT
CLOSURE WOUND 1/2 X4 (GAUZE/BANDAGES/DRESSINGS) ×1
COVER SURGICAL LIGHT HANDLE (MISCELLANEOUS) ×3 IMPLANT
CUFF TOURNIQUET SINGLE 34IN LL (TOURNIQUET CUFF) ×3 IMPLANT
DRAPE EXTREMITY T 121X128X90 (DRAPE) ×3 IMPLANT
DRAPE HALF SHEET 40X57 (DRAPES) ×3 IMPLANT
DRAPE INCISE IOBAN 66X45 STRL (DRAPES) ×6 IMPLANT
DRAPE U-SHAPE 47X51 STRL (DRAPES) ×3 IMPLANT
DRSG AQUACEL AG ADV 3.5X10 (GAUZE/BANDAGES/DRESSINGS) ×3 IMPLANT
DURAPREP 26ML APPLICATOR (WOUND CARE) ×6 IMPLANT
ELECT REM PT RETURN 9FT ADLT (ELECTROSURGICAL) ×3
ELECTRODE REM PT RTRN 9FT ADLT (ELECTROSURGICAL) ×1 IMPLANT
FILTER STRAW FLUID ASPIR (MISCELLANEOUS) IMPLANT
GLOVE BIOGEL M 7.0 STRL (GLOVE) IMPLANT
GLOVE BIOGEL PI IND STRL 7.5 (GLOVE) IMPLANT
GLOVE BIOGEL PI IND STRL 8.5 (GLOVE) ×1 IMPLANT
GLOVE BIOGEL PI INDICATOR 7.5 (GLOVE)
GLOVE BIOGEL PI INDICATOR 8.5 (GLOVE) ×2
GLOVE SURG ORTHO 8.0 STRL STRW (GLOVE) ×6 IMPLANT
GOWN STRL REUS W/ TWL LRG LVL3 (GOWN DISPOSABLE) ×1 IMPLANT
GOWN STRL REUS W/ TWL XL LVL3 (GOWN DISPOSABLE) ×2 IMPLANT
GOWN STRL REUS W/TWL 2XL LVL3 (GOWN DISPOSABLE) ×3 IMPLANT
GOWN STRL REUS W/TWL LRG LVL3 (GOWN DISPOSABLE) ×2
GOWN STRL REUS W/TWL XL LVL3 (GOWN DISPOSABLE) ×4
HANDPIECE INTERPULSE COAX TIP (DISPOSABLE) ×2
HOOD PEEL AWAY FACE SHEILD DIS (HOOD) ×9 IMPLANT
KIT BASIN OR (CUSTOM PROCEDURE TRAY) ×3 IMPLANT
KIT ROOM TURNOVER OR (KITS) ×3 IMPLANT
KNEE CAPITATED TOTAL 3 ×1 IMPLANT
MANIFOLD NEPTUNE II (INSTRUMENTS) ×3 IMPLANT
NEEDLE 18GX1X1/2 (RX/OR ONLY) (NEEDLE) IMPLANT
NEEDLE 22X1 1/2 (OR ONLY) (NEEDLE) ×6 IMPLANT
NS IRRIG 1000ML POUR BTL (IV SOLUTION) ×3 IMPLANT
PACK TOTAL JOINT (CUSTOM PROCEDURE TRAY) ×3 IMPLANT
PAD ARMBOARD 7.5X6 YLW CONV (MISCELLANEOUS) ×6 IMPLANT
SET HNDPC FAN SPRY TIP SCT (DISPOSABLE) ×1 IMPLANT
STRIP CLOSURE SKIN 1/2X4 (GAUZE/BANDAGES/DRESSINGS) ×2 IMPLANT
SUCTION FRAZIER HANDLE 10FR (MISCELLANEOUS)
SUCTION TUBE FRAZIER 10FR DISP (MISCELLANEOUS) IMPLANT
SUT BONE WAX W31G (SUTURE) ×3 IMPLANT
SUT MNCRL AB 3-0 PS2 18 (SUTURE) ×3 IMPLANT
SUT VIC AB 0 CTB1 27 (SUTURE) ×6 IMPLANT
SUT VIC AB 1 CT1 27 (SUTURE) ×4
SUT VIC AB 1 CT1 27XBRD ANBCTR (SUTURE) ×2 IMPLANT
SUT VIC AB 2-0 CT1 27 (SUTURE) ×4
SUT VIC AB 2-0 CT1 TAPERPNT 27 (SUTURE) ×2 IMPLANT
SYR 20CC LL (SYRINGE) ×6 IMPLANT
SYR TB 1ML LUER SLIP (SYRINGE) IMPLANT
TOWEL OR 17X24 6PK STRL BLUE (TOWEL DISPOSABLE) ×3 IMPLANT
TOWEL OR 17X26 10 PK STRL BLUE (TOWEL DISPOSABLE) ×3 IMPLANT
TRAY CATH 16FR W/PLASTIC CATH (SET/KITS/TRAYS/PACK) IMPLANT
TRAY FOLEY BAG SILVER LF 14FR (CATHETERS) ×3 IMPLANT
WRAP KNEE MAXI GEL POST OP (GAUZE/BANDAGES/DRESSINGS) ×3 IMPLANT

## 2017-11-30 NOTE — Anesthesia Procedure Notes (Signed)
Spinal  Patient location during procedure: OR Start time: 11/30/2017 8:32 AM End time: 11/30/2017 8:36 AM Staffing Anesthesiologist: Effie Berkshire, MD Performed: anesthesiologist  Preanesthetic Checklist Completed: patient identified, site marked, surgical consent, pre-op evaluation, timeout performed, IV checked, risks and benefits discussed and monitors and equipment checked Spinal Block Patient position: sitting Prep: Betadine Patient monitoring: heart rate, continuous pulse ox, blood pressure and cardiac monitor Approach: midline Location: L4-5 Injection technique: single-shot Needle Needle type: Introducer and Pencan  Needle gauge: 24 G Needle length: 9 cm Additional Notes Negative paresthesia. Negative blood return. Positive free-flowing CSF. Expiration date of kit checked and confirmed. Patient tolerated procedure well, without complications.

## 2017-11-30 NOTE — Progress Notes (Signed)
Orthopedic Tech Progress Note Patient Details:  Johnathan Barker [birth year] [id number]  CPM Right Knee CPM Right Knee: On Right Knee Flexion (Degrees): 90 Right Knee Extension (Degrees): 0 Additional Comments: Foot roll  Post Interventions Patient Tolerated: Well Instructions Provided: Care of device, Adjustment of device  [name] 11/30/2017, 11:20 AM

## 2017-11-30 NOTE — Transfer of Care (Signed)
Immediate Anesthesia Transfer of Care Note  Patient: Johnathan Barker  Procedure(s) Performed: RIGHT TOTAL KNEE ARTHROPLASTY (Right Knee)  Patient Location: PACU  Anesthesia Type:MAC and Spinal  Level of Consciousness: awake, oriented and patient cooperative  Airway & Oxygen Therapy: Patient Spontanous Breathing and Patient connected to face mask oxygen  Post-op Assessment: Report given to RN and Post -op Vital signs reviewed and stable  Post vital signs: Reviewed  Last Vitals:  Vitals:   11/30/17 0805 11/30/17 0810  BP: 136/62 (!) 141/69  Pulse: (!) 59 (!) 54  Resp: 17 12  Temp:    SpO2: 97% 99%    Last Pain:  Vitals:   11/30/17 0652  TempSrc:   PainSc: 3          Complications: No apparent anesthesia complications

## 2017-11-30 NOTE — Progress Notes (Signed)
Physical Therapy Treatment Patient Details Name: Johnathan Barker MRN: [id number] DOB: June 03, 1941 Today's Date: 11/30/2017    History of Present Illness pt is a 77 y/o male with pmh significant for knee arthritis , DM2, HTN, admitted with history of right knee pain, s/p R TKA.    PT Comments    Pt admitted with/for R TKA.  Pt is presently at min guard level.  Home exercise program started.  Pt currently limited functionally due to the problems listed below.  (see problems list.)  Pt will benefit from PT to maximize function and safety to be able to get home safely with available assist.    Follow Up Recommendations  DC plan and follow up therapy as arranged by surgeon     Equipment Recommendations  None recommended by PT    Recommendations for Other Services       Precautions / Restrictions Precautions Precautions: Knee Precaution Booklet Issued: Yes (comment) Restrictions Weight Bearing Restrictions: Yes RLE Weight Bearing: Weight bearing as tolerated    Mobility  Bed Mobility Overal bed mobility: Needs Assistance Bed Mobility: Supine to Sit     Supine to sit: Min guard     General bed mobility comments: pt able to bridge to edge and come up with little effort.  Transfers Overall transfer level: Needs assistance Equipment used: Rolling walker (2 wheeled) Transfers: Sit to/from Stand Sit to Stand: Min guard         General transfer comment: cues for hand placement  Ambulation/Gait Ambulation/Gait assistance: Min guard Ambulation Distance (Feet): 100 Feet Assistive device: Rolling walker (2 wheeled) Gait Pattern/deviations: Step-to pattern;Decreased stance time - right Gait velocity: slower Gait velocity interpretation: Below normal speed for age/gender General Gait Details: mildly antalgic in nature with R knee flexed minimally in stance.   Stairs            Wheelchair Mobility    Modified Rankin (Stroke Patients Only)       Balance Overall  balance assessment: No apparent balance deficits (not formally assessed)                                          Cognition Arousal/Alertness: Awake/alert Behavior During Therapy: WFL for tasks assessed/performed Overall Cognitive Status: Within Functional Limits for tasks assessed                                        Exercises Total Joint Exercises Ankle Circles/Pumps: AROM;20 reps;Supine Quad Sets: AROM;Strengthening;Both;10 reps;Supine Gluteal Sets: AROM;Strengthening;Both;10 reps;Supine Short Arc Quad: AROM;Right;5 reps;Supine Heel Slides: AROM;Strengthening;Right;10 reps;Supine Hip ABduction/ADduction: AROM;Strengthening;Right;5 reps;Supine Straight Leg Raises: AROM;Right;10 reps;Supine    General Comments        Pertinent Vitals/Pain Pain Assessment: Faces Faces Pain Scale: Hurts little more Pain Location: R knee Pain Descriptors / Indicators: Aching;Sore Pain Intervention(s): Monitored during session    Home Living Family/patient expects to be discharged to:: Private residence Living Arrangements: Spouse/significant other Available Help at Discharge: Family;Available 24 hours/day Type of Home: House Home Access: Stairs to enter Entrance Stairs-Rails: Left;Right Home Layout: One level Home Equipment: Environmental consultant - 2 wheels;Bedside commode;Shower seat      Prior Function Level of Independence: Independent          PT Goals (current goals can now be found in the care plan section) Acute  Rehab PT Goals Patient Stated Goal: home and back independent PT Goal Formulation: With patient Time For Goal Achievement: 12/07/17 Potential to Achieve Goals: Good    Frequency    7X/week      PT Plan      Co-evaluation              AM-PAC PT "6 Clicks" Daily Activity  Outcome Measure  Difficulty turning over in bed (including adjusting bedclothes, sheets and blankets)?: None Difficulty moving from lying on back to sitting  on the side of the bed? : None Difficulty sitting down on and standing up from a chair with arms (e.g., wheelchair, bedside commode, etc,.)?: A Little Help needed moving to and from a bed to chair (including a wheelchair)?: A Little Help needed walking in hospital room?: A Little Help needed climbing 3-5 steps with a railing? : A Little 6 Click Score: 20    End of Session   Activity Tolerance: Patient tolerated treatment well Patient left: in chair;with call bell/phone within reach;with family/visitor present Nurse Communication: Mobility status PT Visit Diagnosis: Difficulty in walking, not elsewhere classified (R26.2);Pain Pain - Right/Left: Right Pain - part of body: Knee     Time: 1701-1740 PT Time Calculation (min) (ACUTE ONLY): 39 min  Charges:  $Gait Training: 8-22 mins $Therapeutic Exercise: 8-22 mins                    G Codes:  Functional Assessment Tool Used: AM-PAC 6 Clicks Basic Mobility;Clinical judgement Functional Limitation: Mobility: Walking and moving around Mobility: Walking and Moving Around Current Status (Q2229): 0 percent impaired, limited or restricted Mobility: Walking and Moving Around Goal Status (N9892): 0 percent impaired, limited or restricted    11/30/2017  Donnella Sham, PT [phone]  (pager)   Tessie Fass Marquel Spoto 11/30/2017, 5:53 PM

## 2017-11-30 NOTE — H&P (Signed)
Johnathan Barker MRN:  [id number] DOB/SEX:  May 07, 1941/male  CHIEF COMPLAINT:  Painful right Knee  HISTORY: Patient is a 77 y.o. male presented with a history of pain in the right knee. Onset of symptoms was gradual starting a few years ago with gradually worsening course since that time. Patient has been treated conservatively with over-the-counter NSAIDs and activity modification. Patient currently rates pain in the knee at 10 out of 10 with activity. There is pain at night.  PAST MEDICAL HISTORY: There are no active problems to display for this patient.  Past Medical History:  Diagnosis Date  . Arthritis    knees  . Diabetes mellitus without complication ([place])    type II  . Dysrhythmia   . History of kidney stones    lithotripsy  . Hypertension    Past Surgical History:  Procedure Laterality Date  . EYE SURGERY Bilateral    cataracts removed= IOL  . TONSILLECTOMY       MEDICATIONS:   Medications Prior to Admission  Medication Sig Dispense Refill Last Dose  . amLODipine (NORVASC) 5 MG tablet Take 5 mg by mouth daily.     Marland Kitchen atorvastatin (LIPITOR) 10 MG tablet Take 10 mg by mouth daily.     Marland Kitchen glimepiride (AMARYL) 4 MG tablet Take 4 mg by mouth 2 (two) times daily.     . Ibuprofen-Diphenhydramine Cit (ADVIL PM PO) Take 1 tablet by mouth at bedtime.     Marland Kitchen losartan-hydrochlorothiazide (HYZAAR) 100-25 MG tablet Take 1 tablet by mouth daily.     . metFORMIN (GLUCOPHAGE) 1000 MG tablet Take 1,000 mg by mouth 2 (two) times daily with a meal.     . metoprolol succinate (TOPROL-XL) 50 MG 24 hr tablet Take 50 mg by mouth daily before breakfast. Take with or immediately following a meal.      . Multiple Vitamins-Minerals (ALIVE MENS ENERGY PO) Take 1 tablet by mouth daily.     . Omega-3 Fatty Acids (FISH OIL) 1000 MG CAPS Take 2,000 mg by mouth daily.     Marland Kitchen ibuprofen (ADVIL,MOTRIN) 800 MG tablet Take 800 mg by mouth every 8 (eight) hours as needed.       ALLERGIES:   Allergies   Allergen Reactions  . Penicillins Hives, Rash and Other (See Comments)    FEVER  PATIENT HAS HAD A PCN REACTION WITH IMMEDIATE RASH, FACIAL/TONGUE/THROAT SWELLING, SOB, OR LIGHTHEADEDNESS WITH HYPOTENSION:  #  #  #  YES  #  #  #   Has patient had a PCN reaction causing severe rash involving mucus membranes or skin necrosis: No Has patient had a PCN reaction that required hospitalization: No Has patient had a PCN reaction occurring within the last 10 years: No If all of the above answers are "NO", then may proceed with Cephalosporin use.     REVIEW OF SYSTEMS:  A comprehensive review of systems was negative except for: Musculoskeletal: positive for arthralgias and bone pain   FAMILY HISTORY:  History reviewed. No pertinent family history.  SOCIAL HISTORY:   Social History   Tobacco Use  . Smoking status: Never Smoker  . Smokeless tobacco: Never Used  Substance Use Topics  . Alcohol use: No    Frequency: Never     EXAMINATION:  Vital signs in last 24 hours:    There were no vitals taken for this visit.  General Appearance:    Alert, cooperative, no distress, appears stated age  Head:    Normocephalic, without obvious abnormality,  atraumatic  Eyes:    PERRL, conjunctiva/corneas clear, EOM's intact, fundi    benign, both eyes       Ears:    Normal TM's and external ear canals, both ears  Nose:   Nares normal, septum midline, mucosa normal, no drainage    or sinus tenderness  Throat:   Lips, mucosa, and tongue normal; teeth and gums normal  Neck:   Supple, symmetrical, trachea midline, no adenopathy;       thyroid:  No enlargement/tenderness/nodules; no carotid   bruit or JVD  Back:     Symmetric, no curvature, ROM normal, no CVA tenderness  Lungs:     Clear to auscultation bilaterally, respirations unlabored  Chest wall:    No tenderness or deformity  Heart:    Regular rate and rhythm, S1 and S2 normal, no murmur, rub   or gallop  Abdomen:     Soft, non-tender, bowel  sounds active all four quadrants,    no masses, no organomegaly  Genitalia:    Normal male without lesion, discharge or tenderness  Rectal:    Normal tone, normal prostate, no masses or tenderness;   guaiac negative stool  Extremities:   Extremities normal, atraumatic, no cyanosis or edema  Pulses:   2+ and symmetric all extremities  Skin:   Skin color, texture, turgor normal, no rashes or lesions  Lymph nodes:   Cervical, supraclavicular, and axillary nodes normal  Neurologic:   CNII-XII intact. Normal strength, sensation and reflexes      throughout    Musculoskeletal:  ROM 0-120, Ligaments intact,  Imaging Review Plain radiographs demonstrate severe degenerative joint disease of the right knee. The overall alignment is neutral. The bone quality appears to be good for age and reported activity level.  Assessment/Plan: Primary osteoarthritis, right knee   The patient history, physical examination and imaging studies are consistent with advanced degenerative joint disease of the right knee. The patient has failed conservative treatment.  The clearance notes were reviewed.  After discussion with the patient it was felt that Total Knee Replacement was indicated. The procedure,  risks, and benefits of total knee arthroplasty were presented and reviewed. The risks including but not limited to aseptic loosening, infection, blood clots, vascular injury, stiffness, patella tracking problems complications among others were discussed. The patient acknowledged the explanation, agreed to proceed with the plan.  Donia Ast 11/30/2017, 6:27 AM

## 2017-11-30 NOTE — Anesthesia Procedure Notes (Addendum)
Anesthesia Regional Block: Adductor canal block   Pre-Anesthetic Checklist: ,, timeout performed, Correct Patient, Correct Site, Correct Laterality, Correct Procedure, Correct Position, site marked, Risks and benefits discussed,  Surgical consent,  Pre-op evaluation,  At surgeon's request and post-op pain management  Laterality: Right  Prep: chloraprep       Needles:  Injection technique: Single-shot  Needle Type: Echogenic Needle     Needle Length: 9cm  Needle Gauge: 21     Additional Needles:   Procedures:,,,, ultrasound used (permanent image in chart),,,,  Narrative:  Start time: 11/30/2017 7:55 AM End time: 11/30/2017 8:00 AM Injection made incrementally with aspirations every 5 mL.  Performed by: Personally  Anesthesiologist: Effie Berkshire, MD  Additional Notes: Patient tolerated the procedure well. Local anesthetic introduced in an incremental fashion under minimal resistance after negative aspirations. No paresthesias were elicited. After completion of the procedure, no acute issues were identified and patient continued to be monitored by RN.

## 2017-11-30 NOTE — Anesthesia Procedure Notes (Signed)
Procedure Name: MAC Date/Time: 11/30/2017 8:40 AM Performed by: Jenne Campus, CRNA Pre-anesthesia Checklist: Patient identified, Emergency Drugs available, Suction available, Patient being monitored and Timeout performed Oxygen Delivery Method: Simple face mask

## 2017-11-30 NOTE — Anesthesia Postprocedure Evaluation (Signed)
Anesthesia Post Note  Patient: Building surveyor  Procedure(s) Performed: RIGHT TOTAL KNEE ARTHROPLASTY (Right Knee)     Patient location during evaluation: PACU Anesthesia Type: Spinal Level of consciousness: oriented and awake and alert Pain management: pain level controlled Vital Signs Assessment: post-procedure vital signs reviewed and stable Respiratory status: spontaneous breathing, respiratory function stable and patient connected to nasal cannula oxygen Cardiovascular status: blood pressure returned to baseline and stable Postop Assessment: no headache, no backache, no apparent nausea or vomiting, spinal receding and patient able to bend at knees Anesthetic complications: no    Last Vitals:  Vitals:   11/30/17 1412 11/30/17 1427  BP: (!) 165/73 (!) 161/68  Pulse: 69 70  Resp: 18 16  Temp:    SpO2: 99% 98%    Last Pain:  Vitals:   11/30/17 1350  TempSrc:   PainSc: [place]

## 2017-12-01 ENCOUNTER — Encounter (HOSPITAL_COMMUNITY): Payer: Self-pay | Admitting: Orthopedic Surgery

## 2017-12-01 DIAGNOSIS — Z87442 Personal history of urinary calculi: Secondary | ICD-10-CM | POA: Diagnosis not present

## 2017-12-01 DIAGNOSIS — E119 Type 2 diabetes mellitus without complications: Secondary | ICD-10-CM | POA: Diagnosis not present

## 2017-12-01 DIAGNOSIS — M1711 Unilateral primary osteoarthritis, right knee: Secondary | ICD-10-CM | POA: Diagnosis not present

## 2017-12-01 DIAGNOSIS — Z7982 Long term (current) use of aspirin: Secondary | ICD-10-CM | POA: Diagnosis not present

## 2017-12-01 DIAGNOSIS — I10 Essential (primary) hypertension: Secondary | ICD-10-CM | POA: Diagnosis not present

## 2017-12-01 DIAGNOSIS — Z79899 Other long term (current) drug therapy: Secondary | ICD-10-CM | POA: Diagnosis not present

## 2017-12-01 LAB — CBC
HCT: 35.1 % — ABNORMAL LOW (ref 39.0–52.0)
Hemoglobin: 11.6 g/dL — ABNORMAL LOW (ref 13.0–17.0)
MCH: 29.4 pg (ref 26.0–34.0)
MCHC: 33 g/dL (ref 30.0–36.0)
MCV: 89.1 fL (ref 78.0–100.0)
Platelets: 340 10*3/uL (ref 150–400)
RBC: 3.94 MIL/uL — AB (ref 4.22–5.81)
RDW: 13 % (ref 11.5–15.5)
WBC: 16.2 10*3/uL — ABNORMAL HIGH (ref 4.0–10.5)

## 2017-12-01 LAB — GLUCOSE, CAPILLARY
GLUCOSE-CAPILLARY: 173 mg/dL — AB (ref 65–99)
Glucose-Capillary: 187 mg/dL — ABNORMAL HIGH (ref 65–99)

## 2017-12-01 LAB — BASIC METABOLIC PANEL
Anion gap: 9 (ref 5–15)
BUN: 23 mg/dL — AB (ref 6–20)
CALCIUM: 8.8 mg/dL — AB (ref 8.9–10.3)
CO2: 26 mmol/L (ref 22–32)
Chloride: 103 mmol/L (ref 101–111)
Creatinine, Ser: 1.03 mg/dL (ref 0.61–1.24)
GFR calc Af Amer: >60 mL/min (ref >60)
GFR calc non Af Amer: >60 mL/min (ref >60)
Glucose, Bld: 201 mg/dL — ABNORMAL HIGH (ref 65–99)
Potassium: 3.8 mmol/L (ref 3.5–5.1)
SODIUM: 138 mmol/L (ref 135–145)

## 2017-12-01 MED ORDER — METHOCARBAMOL 500 MG PO TABS: 500.0000 mg | ORAL_TABLET | Freq: Four times a day (QID) | ORAL | 0 refills | Status: DC | PRN

## 2017-12-01 MED ORDER — OXYCODONE HCL 10 MG PO TABS: 10.0000 mg | ORAL_TABLET | ORAL | 0 refills | Status: DC | PRN

## 2017-12-01 MED ORDER — ASPIRIN 325 MG PO TBEC: 325.0000 mg | DELAYED_RELEASE_TABLET | Freq: Two times a day (BID) | ORAL | 0 refills | Status: DC

## 2017-12-01 NOTE — Care Management Obs Status (Signed)
[place] NOTIFICATION   Patient Details  Name: Johnathan Barker MRN: [id number] Date of Birth: 1941/01/01   Medicare Observation Status Notification Given:  Yes    Ninfa Meeker, RN 12/01/2017, 10:17 AM

## 2017-12-01 NOTE — Discharge Summary (Signed)
SPORTS MEDICINE & JOINT REPLACEMENT   Lara Mulch, MD   Carlyon Shadow, PA-C [address], [place], [place]  [address]                             [phone]  PATIENT ID: Johnathan Barker        MRN:  [id number]          DOB/AGE: 02/08/1941 / 77 y.o.    DISCHARGE SUMMARY  ADMISSION DATE:    11/30/2017 DISCHARGE DATE:   12/01/2017   ADMISSION DIAGNOSIS: primary osteoarthritis right knee    DISCHARGE DIAGNOSIS:  primary osteoarthritis right knee    ADDITIONAL DIAGNOSIS: Active Problems:   S/P total knee replacement  Past Medical History:  Diagnosis Date  . Arthritis    knees  . Diabetes mellitus without complication ([place])    type II  . Dysrhythmia   . History of kidney stones    lithotripsy  . Hypertension     PROCEDURE: Procedure(s): RIGHT TOTAL KNEE ARTHROPLASTY on 11/30/2017  CONSULTS:    HISTORY:  See H&P in chart  HOSPITAL COURSE:  Johnathan Barker is a 77 y.o. admitted on 11/30/2017 and found to have a diagnosis of primary osteoarthritis right knee.  After appropriate laboratory studies were obtained  they were taken to the operating room on 11/30/2017 and underwent Procedure(s): RIGHT TOTAL KNEE ARTHROPLASTY.   They were given perioperative antibiotics:  Anti-infectives (From admission, onward)   Start     Dose/Rate Route Frequency Ordered Stop   11/30/17 1730  clindamycin (CLEOCIN) IVPB 600 mg     600 mg 100 mL/hr over 30 Minutes Intravenous Every 6 hours 11/30/17 1056 12/01/17 0103   11/30/17 0715  clindamycin (CLEOCIN) IVPB 900 mg     900 mg 100 mL/hr over 30 Minutes Intravenous  Once 11/30/17 0712 11/30/17 0827   11/30/17 0712  clindamycin (CLEOCIN) 900 MG/50ML IVPB    Comments:  Tamsen Snider   : cabinet override      11/30/17 9563 11/30/17 0827    .  Patient given tranexamic acid IV or topical and exparel intra-operatively.  Tolerated the procedure well.    POD# 1: Vital signs were stable.  Patient denied Chest pain, shortness of breath, or  calf pain.  Patient was started on Lovenox 30 mg subcutaneously twice daily at 8am.  Consults to PT, OT, and care management were made.  The patient was weight bearing as tolerated.  CPM was placed on the operative leg 0-90 degrees for 6-8 hours a day. When out of the CPM, patient was placed in the foam block to achieve full extension. Incentive spirometry was taught.  Dressing was changed.       POD #2, Continued  PT for ambulation and exercise program.  IV saline locked.  O2 discontinued.    The remainder of the hospital course was dedicated to ambulation and strengthening.   The patient was discharged on 1 Day Post-Op in  Good condition.  Blood products given:none  DIAGNOSTIC STUDIES: Recent vital signs:  Patient Vitals for the past 24 hrs:  BP Temp Temp src Pulse Resp SpO2  12/01/17 1047 (!) 149/75 - - - - -  12/01/17 0631 139/65 98 F (36.7 C) Oral 65 18 99 %  12/01/17 0139 132/69 97.6 F (36.4 C) Oral 71 18 95 %  11/30/17 2039 (!) 181/86 98.2 F (36.8 C) Oral 83 20 99 %  11/30/17 1658 (!) 154/72  97.9 F (36.6 C) Oral 84 - 97 %  11/30/17 1541 (!) 154/74 - - 79 (!) 22 98 %  11/30/17 1500 - - - 74 (!) 21 98 %  11/30/17 1442 (!) 153/78 - - 68 11 98 %  11/30/17 1427 (!) 161/68 - - 70 16 98 %  11/30/17 1412 (!) 165/73 - - 69 18 99 %  11/30/17 1357 (!) 162/78 - - 70 16 98 %  11/30/17 1350 - - - 68 12 98 %  11/30/17 1342 (!) 165/79 - - 69 13 98 %  11/30/17 1340 - (!) 97 F (36.1 C) - - - -  11/30/17 1327 (!) 154/71 - - 67 12 98 %  11/30/17 1311 (!) 146/78 - - 65 16 99 %  11/30/17 1258 (!) 148/76 - - 65 19 99 %  11/30/17 1241 (!) 150/71 - - 64 16 98 %  11/30/17 1226 (!) 148/72 - - 65 13 97 %       Recent laboratory studies: Recent Labs    12/01/17 0610  WBC 16.2*  HGB 11.6*  HCT 35.1*  PLT 340   Recent Labs    12/01/17 0610  NA 138  K 3.8  CL 103  CO2 26  BUN 23*  CREATININE 1.03  GLUCOSE 201*  CALCIUM 8.8*   No results found for: INR, PROTIME   Recent  Radiographic Studies :  No results found.  DISCHARGE INSTRUCTIONS: Discharge Instructions    CPM   Complete by:  As directed    Continuous passive motion machine (CPM):      Use the CPM from 0 to 90 for 4-6 hours per day.      You may increase by 2 per day.  You may break it up into 2 or 3 sessions per day.      Use CPM for 10 weeks or until you are told to stop.   Call MD / Call 911   Complete by:  As directed    If you experience chest pain or shortness of breath, CALL 911 and be transported to the hospital emergency room.  If you develope a fever above 101 F, pus (white drainage) or increased drainage or redness at the wound, or calf pain, call your surgeon's office.   Constipation Prevention   Complete by:  As directed    Drink plenty of fluids.  Prune juice may be helpful.  You may use a stool softener, such as Colace (over the counter) 100 mg twice a day.  Use MiraLax (over the counter) for constipation as needed.   Diet - low sodium heart healthy   Complete by:  As directed    Discharge instructions   Complete by:  As directed    INSTRUCTIONS AFTER JOINT REPLACEMENT   Remove items at home which could result in a fall. This includes throw rugs or furniture in walking pathways ICE to the affected joint every three hours while awake for 30 minutes at a time, for at least the first 3-5 days, and then as needed for pain and swelling.  Continue to use ice for pain and swelling. You may notice swelling that will progress down to the foot and ankle.  This is normal after surgery.  Elevate your leg when you are not up walking on it.   Continue to use the breathing machine you got in the hospital (incentive spirometer) which will help keep your temperature down.  It is common for your temperature to cycle  up and down following surgery, especially at night when you are not up moving around and exerting yourself.  The breathing machine keeps your lungs expanded and your temperature  down.   DIET:  As you were doing prior to hospitalization, we recommend a well-balanced diet.  DRESSING / WOUND CARE / SHOWERING  Keep the surgical dressing until follow up.  The dressing is water proof, so you can shower without any extra covering.  IF THE DRESSING FALLS OFF or the wound gets wet inside, change the dressing with sterile gauze.  Please use good hand washing techniques before changing the dressing.  Do not use any lotions or creams on the incision until instructed by your surgeon.    ACTIVITY  Increase activity slowly as tolerated, but follow the weight bearing instructions below.   No driving for 6 weeks or until further direction given by your physician.  You cannot drive while taking narcotics.  No lifting or carrying greater than 10 lbs. until further directed by your surgeon. Avoid periods of inactivity such as sitting longer than an hour when not asleep. This helps prevent blood clots.  You may return to work once you are authorized by your doctor.     WEIGHT BEARING   Weight bearing as tolerated with assist device (walker, cane, etc) as directed, use it as long as suggested by your surgeon or therapist, typically at least 4-6 weeks.   EXERCISES  Results after joint replacement surgery are often greatly improved when you follow the exercise, range of motion and muscle strengthening exercises prescribed by your doctor. Safety measures are also important to protect the joint from further injury. Any time any of these exercises cause you to have increased pain or swelling, decrease what you are doing until you are comfortable again and then slowly increase them. If you have problems or questions, call your caregiver or physical therapist for advice.   Rehabilitation is important following a joint replacement. After just a few days of immobilization, the muscles of the leg can become weakened and shrink (atrophy).  These exercises are designed to build up the tone and  strength of the thigh and leg muscles and to improve motion. Often times heat used for twenty to thirty minutes before working out will loosen up your tissues and help with improving the range of motion but do not use heat for the first two weeks following surgery (sometimes heat can increase post-operative swelling).   These exercises can be done on a training (exercise) mat, on the floor, on a table or on a bed. Use whatever works the best and is most comfortable for you.    Use music or television while you are exercising so that the exercises are a pleasant break in your day. This will make your life better with the exercises acting as a break in your routine that you can look forward to.   Perform all exercises about fifteen times, three times per day or as directed.  You should exercise both the operative leg and the other leg as well.   Exercises include:   Quad Sets - Tighten up the muscle on the front of the thigh (Quad) and hold for 5-10 seconds.   Straight Leg Raises - With your knee straight (if you were given a brace, keep it on), lift the leg to 60 degrees, hold for 3 seconds, and slowly lower the leg.  Perform this exercise against resistance later as your leg gets stronger.  Leg Slides:  Lying on your back, slowly slide your foot toward your buttocks, bending your knee up off the floor (only go as far as is comfortable). Then slowly slide your foot back down until your leg is flat on the floor again.  Angel Wings: Lying on your back spread your legs to the side as far apart as you can without causing discomfort.  Hamstring Strength:  Lying on your back, push your heel against the floor with your leg straight by tightening up the muscles of your buttocks.  Repeat, but this time bend your knee to a comfortable angle, and push your heel against the floor.  You may put a pillow under the heel to make it more comfortable if necessary.   A rehabilitation program following joint replacement  surgery can speed recovery and prevent re-injury in the future due to weakened muscles. Contact your doctor or a physical therapist for more information on knee rehabilitation.    CONSTIPATION  Constipation is defined medically as fewer than three stools per week and severe constipation as less than one stool per week.  Even if you have a regular bowel pattern at home, your normal regimen is likely to be disrupted due to multiple reasons following surgery.  Combination of anesthesia, postoperative narcotics, change in appetite and fluid intake all can affect your bowels.   YOU MUST use at least one of the following options; they are listed in order of increasing strength to get the job done.  They are all available over the counter, and you may need to use some, POSSIBLY even all of these options:    Drink plenty of fluids (prune juice may be helpful) and high fiber foods Colace 100 mg by mouth twice a day  Senokot for constipation as directed and as needed Dulcolax (bisacodyl), take with full glass of water  Miralax (polyethylene glycol) once or twice a day as needed.  If you have tried all these things and are unable to have a bowel movement in the first 3-4 days after surgery call either your surgeon or your primary doctor.    If you experience loose stools or diarrhea, hold the medications until you stool forms back up.  If your symptoms do not get better within 1 week or if they get worse, check with your doctor.  If you experience "the worst abdominal pain ever" or develop nausea or vomiting, please contact the office immediately for further recommendations for treatment.   ITCHING:  If you experience itching with your medications, try taking only a single pain pill, or even half a pain pill at a time.  You can also use Benadryl over the counter for itching or also to help with sleep.   TED HOSE STOCKINGS:  Use stockings on both legs until for at least 2 weeks or as directed by physician  office. They may be removed at night for sleeping.  MEDICATIONS:  See your medication summary on the "After Visit Summary" that nursing will review with you.  You may have some home medications which will be placed on hold until you complete the course of blood thinner medication.  It is important for you to complete the blood thinner medication as prescribed.  PRECAUTIONS:  If you experience chest pain or shortness of breath - call 911 immediately for transfer to the hospital emergency department.   If you develop a fever greater that 101 F, purulent drainage from wound, increased redness or drainage from wound, foul odor from the wound/dressing, or  calf pain - CONTACT YOUR SURGEON.                                                   FOLLOW-UP APPOINTMENTS:  If you do not already have a post-op appointment, please call the office for an appointment to be seen by your surgeon.  Guidelines for how soon to be seen are listed in your "After Visit Summary", but are typically between 1-4 weeks after surgery.  OTHER INSTRUCTIONS:   Knee Replacement:  Do not place pillow under knee, focus on keeping the knee straight while resting. CPM instructions: 0-90 degrees, 2 hours in the morning, 2 hours in the afternoon, and 2 hours in the evening. Place foam block, curve side up under heel at all times except when in CPM or when walking.  DO NOT modify, tear, cut, or change the foam block in any way.  MAKE SURE YOU:  Understand these instructions.  Get help right away if you are not doing well or get worse.    Thank you for letting us be a part of your medical care team.  It is a privilege we respect greatly.  We hope these instructions will help you stay on track for a fast and full recovery!   Increase activity slowly as tolerated   Complete by:  As directed       DISCHARGE MEDICATIONS:   Allergies as of 12/01/2017      Reactions   Penicillins Hives, Rash, Other (See Comments)   FEVER PATIENT HAS HAD A  PCN REACTION WITH IMMEDIATE RASH, FACIAL/TONGUE/THROAT SWELLING, SOB, OR LIGHTHEADEDNESS WITH HYPOTENSION:  #  #  #  YES  #  #  #   Has patient had a PCN reaction causing severe rash involving mucus membranes or skin necrosis: No Has patient had a PCN reaction that required hospitalization: No Has patient had a PCN reaction occurring within the last 10 years: No If all of the above answers are "NO", then may proceed with Cephalosporin use.      Medication List    STOP taking these medications   ADVIL PM PO   ibuprofen 800 MG tablet Commonly known as:  ADVIL,MOTRIN     TAKE these medications   ALIVE MENS ENERGY PO Take 1 tablet by mouth daily.   amLODipine 5 MG tablet Commonly known as:  NORVASC Take 5 mg by mouth daily.   aspirin 325 MG EC tablet Take 1 tablet (325 mg total) by mouth 2 (two) times daily.   atorvastatin 10 MG tablet Commonly known as:  LIPITOR Take 10 mg by mouth daily.   Fish Oil 1000 MG Caps Take 2,000 mg by mouth daily.   glimepiride 4 MG tablet Commonly known as:  AMARYL Take 4 mg by mouth 2 (two) times daily.   losartan-hydrochlorothiazide 100-25 MG tablet Commonly known as:  HYZAAR Take 1 tablet by mouth daily.   metFORMIN 1000 MG tablet Commonly known as:  GLUCOPHAGE Take 1,000 mg by mouth 2 (two) times daily with a meal.   methocarbamol 500 MG tablet Commonly known as:  ROBAXIN Take 1-2 tablets (500-1,000 mg total) by mouth every 6 (six) hours as needed for muscle spasms.   metoprolol succinate 50 MG 24 hr tablet Commonly known as:  TOPROL-XL Take 50 mg by mouth daily before breakfast. Take with or  immediately following a meal.   Oxycodone HCl 10 MG Tabs Take 1 tablet (10 mg total) by mouth every 4 (four) hours as needed for severe pain.            Durable Medical Equipment  (From admission, onward)        Start     Ordered   11/30/17 1708  DME Walker rolling  Once    Question:  Patient needs a walker to treat with the  following condition  Answer:  S/P total knee replacement   11/30/17 1707   11/30/17 1708  DME 3 n 1  Once     11/30/17 1707   11/30/17 1708  DME Bedside commode  Once    Question:  Patient needs a bedside commode to treat with the following condition  Answer:  S/P total knee replacement   11/30/17 1707      FOLLOW UP VISIT:   Follow-up Information    [facility] Follow up.   Specialty:  [place] Why:  A representative from [facility] will contact you to arrange start date and time for your therapy. Contact information: [address] [place] [address] [phone]           DISPOSITION: HOME VS. SNF  CONDITION:  Good   Donia Ast 12/01/2017, 12:16 PM

## 2017-12-01 NOTE — Progress Notes (Signed)
Physical Therapy Treatment Patient Details Name: Johnathan Barker MRN: [id number] DOB: Mar 25, 1941 Today's Date: 12/01/2017    History of Present Illness pt is a 77 y/o male with pmh significant for knee arthritis , DM2, HTN, admitted with history of right knee pain, s/p R TKA.    PT Comments    Patient is progressing well toward mobility goals. Pt tolerated increased gait distance with supervision for safety and able to safely ascend/descend steps simulating home entrance with min guard. Review HEP next session.    Follow Up Recommendations  DC plan and follow up therapy as arranged by surgeon     Equipment Recommendations  None recommended by PT    Recommendations for Other Services       Precautions / Restrictions Precautions Precautions: Knee Precaution Booklet Issued: Yes (comment) Restrictions Weight Bearing Restrictions: Yes RLE Weight Bearing: Weight bearing as tolerated    Mobility  Bed Mobility Overal bed mobility: Modified Independent Bed Mobility: Supine to Sit           General bed mobility comments: increased time and effort  Transfers Overall transfer level: Needs assistance Equipment used: Rolling walker (2 wheeled) Transfers: Sit to/from Stand Sit to Stand: Min guard         General transfer comment: cues for safe hand placement  Ambulation/Gait Ambulation/Gait assistance: Supervision Ambulation Distance (Feet): 200 Feet Assistive device: Rolling walker (2 wheeled) Gait Pattern/deviations: Decreased stance time - right;Step-through pattern;Decreased step length - left;Decreased weight shift to right     General Gait Details: cues for posture, sequencing, and step length symmetry   Stairs Stairs: Yes   Stair Management: One rail Left;Step to pattern;Forwards Number of Stairs: 2 General stair comments: cues for sequencing and technique; min guard for safety; no physical assist needed  Wheelchair Mobility    Modified Rankin (Stroke  Patients Only)       Balance Overall balance assessment: No apparent balance deficits (not formally assessed)                                          Cognition Arousal/Alertness: Awake/alert Behavior During Therapy: WFL for tasks assessed/performed Overall Cognitive Status: Within Functional Limits for tasks assessed                                        Exercises      General Comments        Pertinent Vitals/Pain Pain Assessment: Faces Faces Pain Scale: Hurts a little bit Pain Location: R knee Pain Descriptors / Indicators: Sore Pain Intervention(s): Monitored during session;Repositioned    Home Living                      Prior Function            PT Goals (current goals can now be found in the care plan section) Acute Rehab PT Goals Patient Stated Goal: home and back independent PT Goal Formulation: With patient Time For Goal Achievement: 12/07/17 Potential to Achieve Goals: Good Progress towards PT goals: Progressing toward goals    Frequency    7X/week      PT Plan Current plan remains appropriate    Co-evaluation              AM-PAC PT "6 Clicks" Daily  Activity  Outcome Measure  Difficulty turning over in bed (including adjusting bedclothes, sheets and blankets)?: None Difficulty moving from lying on back to sitting on the side of the bed? : None Difficulty sitting down on and standing up from a chair with arms (e.g., wheelchair, bedside commode, etc,.)?: A Lot Help needed moving to and from a bed to chair (including a wheelchair)?: None Help needed walking in hospital room?: A Little Help needed climbing 3-5 steps with a railing? : A Little 6 Click Score: 20    End of Session Equipment Utilized During Treatment: Gait belt Activity Tolerance: Patient tolerated treatment well Patient left: in chair;with call bell/phone within reach Nurse Communication: Mobility status PT Visit Diagnosis:  Difficulty in walking, not elsewhere classified (R26.2);Pain Pain - Right/Left: Right Pain - part of body: Knee     Time: 4081-4481 PT Time Calculation (min) (ACUTE ONLY): 20 min  Charges:  $Gait Training: 8-22 mins                    G Codes:       Earney Navy, PTA Pager: [phone]     Darliss Cheney 12/01/2017, 8:54 AM

## 2017-12-01 NOTE — Progress Notes (Signed)
Physical Therapy Treatment Patient Details Name: Johnathan Barker MRN: [id number] DOB: 1941/09/17 Today's Date: 12/01/2017    History of Present Illness pt is a 77 y/o male with pmh significant for knee arthritis , DM2, HTN, admitted with history of right knee pain, s/p R TKA.    PT Comments    Patient is making good progress with PT.  From a mobility standpoint anticipate patient will be ready for DC home when medically ready.    Follow Up Recommendations  DC plan and follow up therapy as arranged by surgeon     Equipment Recommendations  None recommended by PT    Recommendations for Other Services       Precautions / Restrictions Precautions Precautions: Knee Precaution Booklet Issued: Yes (comment) Restrictions Weight Bearing Restrictions: Yes RLE Weight Bearing: Weight bearing as tolerated    Mobility  Bed Mobility Overal bed mobility: Modified Independent                Transfers Overall transfer level: Needs assistance Equipment used: Rolling walker (2 wheeled) Transfers: Sit to/from Stand Sit to Stand: Min guard         General transfer comment: min guard for safety  Ambulation/Gait Ambulation/Gait assistance: Supervision Ambulation Distance (Feet): 120 Feet Assistive device: Rolling walker (2 wheeled) Gait Pattern/deviations: Step-through pattern;Decreased stride length;Trunk flexed     General Gait Details: cues for posture    Stairs            Wheelchair Mobility    Modified Rankin (Stroke Patients Only)       Balance Overall balance assessment: No apparent balance deficits (not formally assessed)                                          Cognition Arousal/Alertness: Awake/alert Behavior During Therapy: WFL for tasks assessed/performed Overall Cognitive Status: Within Functional Limits for tasks assessed                                        Exercises Total Joint Exercises Quad Sets:  AROM;Strengthening;10 reps;Supine;Right Short Arc Quad: AROM;Supine;Right;10 reps Heel Slides: AROM;Right;10 reps;Supine Hip ABduction/ADduction: AROM;Right;Supine;10 reps Straight Leg Raises: AROM;Right;10 reps;Supine Long Arc Quad: AROM;Right;10 reps;Seated Knee Flexion: AROM;Right;10 reps;Seated Goniometric ROM: approx 10-90    General Comments General comments (skin integrity, edema, etc.): wife present during session      Pertinent Vitals/Pain Pain Assessment: Faces Faces Pain Scale: Hurts a little bit Pain Location: R knee Pain Descriptors / Indicators: Sore Pain Intervention(s): Monitored during session;Repositioned    Home Living                      Prior Function            PT Goals (current goals can now be found in the care plan section) Acute Rehab PT Goals Patient Stated Goal: home and back independent PT Goal Formulation: With patient Time For Goal Achievement: 12/07/17 Potential to Achieve Goals: Good Progress towards PT goals: Progressing toward goals    Frequency    7X/week      PT Plan Current plan remains appropriate    Co-evaluation              AM-PAC PT "6 Clicks" Daily Activity  Outcome Measure  Difficulty turning over in  bed (including adjusting bedclothes, sheets and blankets)?: None Difficulty moving from lying on back to sitting on the side of the bed? : None Difficulty sitting down on and standing up from a chair with arms (e.g., wheelchair, bedside commode, etc,.)?: A Lot Help needed moving to and from a bed to chair (including a wheelchair)?: None Help needed walking in hospital room?: A Little Help needed climbing 3-5 steps with a railing? : A Little 6 Click Score: 20    End of Session Equipment Utilized During Treatment: Gait belt Activity Tolerance: Patient tolerated treatment well Patient left: with call bell/phone within reach;in bed;with family/visitor present Nurse Communication: Mobility status PT  Visit Diagnosis: Difficulty in walking, not elsewhere classified (R26.2);Pain Pain - Right/Left: Right Pain - part of body: Knee     Time: 5300-5110 PT Time Calculation (min) (ACUTE ONLY): 17 min  Charges:  $Therapeutic Exercise: 8-22 mins                    G Codes:       Earney Navy, PTA Pager: [phone]     Darliss Cheney 12/01/2017, 2:10 PM

## 2017-12-01 NOTE — Care Management Note (Signed)
Case Management Note  Patient Details  Name: Johnathan Barker MRN: [id number] Date of Birth: February 26, 1941  Subjective/Objective:  77 yr old gentleman s/p right total knee arthroplasty.                  Action/Plan: Case manager discussed discharge plan and DME needs with patient and his family. Patient was preoperatively setup with [facility], no changes. He has RW, 3in1 and CPM have been delivered to patient's home. He has good family support at discharge.   Expected Discharge Date:   12/01/17               Expected Discharge Plan:  [place]  In-House Referral:  NA  Discharge planning Services  CM Consult  Post Acute Care Choice:  Durable Medical Equipment, Home Health Choice offered to:  Patient, Spouse  DME Arranged:  3-N-1, CPM(has Rolling walker) DME Agency:  [employer]  HH Arranged:  PT HH Agency:  [facility] (formerly [employer])  Status of Service:  Completed, signed off  If discussed at [employer], dates discussed:    Additional Comments:  Ninfa Meeker, RN 12/01/2017, 10:36 AM

## 2017-12-01 NOTE — Op Note (Signed)
TOTAL KNEE REPLACEMENT OPERATIVE NOTE:  11/30/2017  3:22 PM  PATIENT:  Johnathan Barker  77 y.o. male  PRE-OPERATIVE DIAGNOSIS:  primary osteoarthritis right knee  POST-OPERATIVE DIAGNOSIS:  primary osteoarthritis right knee  PROCEDURE:  Procedure(s): RIGHT TOTAL KNEE ARTHROPLASTY  SURGEON:  Surgeon(s): Vickey Huger, MD  PHYSICIAN ASSISTANT:  Nehemiah Massed, [facility]  ANESTHESIA:   spinal  DRAINS: Hemovac  SPECIMEN: None  COUNTS:  Correct  TOURNIQUET:   Total Tourniquet Time Documented: Thigh (Right) - 50 minutes Total: Thigh (Right) - 50 minutes   DICTATION:  Indication for procedure:    The patient is a 77 y.o. male who has failed conservative treatment for primary osteoarthritis right knee.  Informed consent was obtained prior to anesthesia. The risks versus benefits of the operation were explain and in a way the patient can, and did, understand.   On the implant demand matching protocol, this patient scored 9.  Therefore, this patient was not receive a polyethylene insert with vitamin E which is a high demand implant.  Description of procedure:     The patient was taken to the operating room and placed under anesthesia.  The patient was positioned in the usual fashion taking care that all body parts were adequately padded and/or protected.  I foley catheter was not placed.  A tourniquet was applied and the leg prepped and draped in the usual sterile fashion.  The extremity was exsanguinated with the esmarch and tourniquet inflated to 350 mmHg.  Pre-operative range of motion was normal.  The knee was in 5 degree of mild varus.  A midline incision approximately 6-7 inches long was made with a #10 blade.  A new blade was used to make a parapatellar arthrotomy going 2-3 cm into the quadriceps tendon, over the patella, and alongside the medial aspect of the patellar tendon.  A synovectomy was then performed with the #10 blade and forceps. I then elevated the deep MCL off the medial  tibial metaphysis subperiosteally around to the semimembranosus attachment.    I everted the patella and used calipers to measure patellar thickness.  I used the reamer to ream down to appropriate thickness to recreate the native thickness.  I then removed excess bone with the rongeur and sagittal saw.  I used the appropriately sized template and drilled the three lug holes.  I then put the trial in place and measured the thickness with the calipers to ensure recreation of the native thickness.  The trial was then removed and the patella subluxed and the knee brought into flexion.  A homan retractor was place to retract and protect the patella and lateral structures.  A Z-retractor was place medially to protect the medial structures.  The extra-medullary alignment system was used to make cut the tibial articular surface perpendicular to the anamotic axis of the tibia and in 3 degrees of posterior slope.  The cut surface and alignment jig was removed.  I then used the intramedullary alignment guide to make a 6 valgus cut on the distal femur.  I then marked out the epicondylar axis on the distal femur.  The posterior condylar axis measured 3 degrees.  I then used the anterior referencing sizer and measured the femur to be a size 11.  The 4-In-1 cutting block was screwed into place in external rotation matching the posterior condylar angle, making our cuts perpendicular to the epicondylar axis.  Anterior, posterior and chamfer cuts were made with the sagittal saw.  The cutting block and cut pieces  were removed.  A lamina spreader was placed in 90 degrees of flexion.  The ACL, PCL, menisci, and posterior condylar osteophytes were removed.  A 11 mm spacer blocked was found to offer good flexion and extension gap balance after mild in degree releasing.   The scoop retractor was then placed and the femoral finishing block was pinned in place.  The small sagittal saw was used as well as the lug drill to finish the  femur.  The block and cut surfaces were removed and the medullary canal hole filled with autograft bone from the cut pieces.  The tibia was delivered forward in deep flexion and external rotation.  A size G tray was selected and pinned into place centered on the medial 1/3 of the tibial tubercle.  The reamer and keel was used to prepare the tibia through the tray.    I then trialed with the size 11 femur, size G tibia, a 11 mm insert and the 38 patella.  I had excellent flexion/extension gap balance, excellent patella tracking.  Flexion was full and beyond 120 degrees; extension was zero.  These components were chosen and the staff opened them to me on the back table while the knee was lavaged copiously and the cement mixed.  The soft tissue was infiltrated with 60cc of exparel 1.3% through a 21 gauge needle.  I cemented in the components and removed all excess cement.  The polyethylene tibial component was snapped into place and the knee placed in extension while cement was hardening.  The capsule was infilltrated with 30cc of .25% Marcaine with epinephrine.  A hemovac was place in the joint exiting superolaterally.  A pain pump was place superomedially superficial to the arthrotomy.  Once the cement was hard, the tourniquet was let down.  Hemostasis was obtained.  The arthrotomy was closed with figure-8 #1 vicryl sutures.  The deep soft tissues were closed with #0 vicryls and the subcuticular layer closed with a running #2-0 vicryl.  The skin was reapproximated and closed with skin staples.  The wound was dressed with xeroform, 4 x4's, 2 ABD sponges, a single layer of webril and a TED stocking.   The patient was then awakened, extubated, and taken to the recovery room in stable condition.  BLOOD LOSS:  300cc DRAINS: 1 hemovac, 1 pain catheter COMPLICATIONS:  None.  PLAN OF CARE: Admit to inpatient   PATIENT DISPOSITION:  PACU - hemodynamically stable.   Delay start of Pharmacological VTE agent  (>24hrs) due to surgical blood loss or risk of bleeding:  not applicable  Please fax a copy of this op note to my office at [phone] (please only include page 1 and 2 of the Case Information op note)

## 2017-12-01 NOTE — Progress Notes (Signed)
SPORTS MEDICINE AND JOINT REPLACEMENT  Lara Mulch, MD    Carlyon Shadow, PA-C [address], [place], [place]  [address]                             [phone]   PROGRESS NOTE  Subjective:  negative for Chest Pain  negative for Shortness of Breath  negative for Nausea/Vomiting   negative for Calf Pain  negative for Bowel Movement   Tolerating Diet: yes         Patient reports pain as 3 on 0-10 scale.    Objective: Vital signs in last 24 hours:    Patient Vitals for the past 24 hrs:  BP Temp Temp src Pulse Resp SpO2  12/01/17 0631 139/65 98 F (36.7 C) Oral 65 18 99 %  12/01/17 0139 132/69 97.6 F (36.4 C) Oral 71 18 95 %  11/30/17 2039 (!) 181/86 98.2 F (36.8 C) Oral 83 20 99 %  11/30/17 1658 (!) 154/72 97.9 F (36.6 C) Oral 84 - 97 %  11/30/17 1541 (!) 154/74 - - 79 (!) 22 98 %  11/30/17 1500 - - - 74 (!) 21 98 %  11/30/17 1442 (!) 153/78 - - 68 11 98 %  11/30/17 1427 (!) 161/68 - - 70 16 98 %  11/30/17 1412 (!) 165/73 - - 69 18 99 %  11/30/17 1357 (!) 162/78 - - 70 16 98 %  11/30/17 1350 - - - 68 12 98 %  11/30/17 1342 (!) 165/79 - - 69 13 98 %  11/30/17 1340 - (!) 97 F (36.1 C) - - - -  11/30/17 1327 (!) 154/71 - - 67 12 98 %  11/30/17 1311 (!) 146/78 - - 65 16 99 %  11/30/17 1258 (!) 148/76 - - 65 19 99 %  11/30/17 1241 (!) 150/71 - - 64 16 98 %  11/30/17 1226 (!) 148/72 - - 65 13 97 %  11/30/17 1211 137/69 - - 60 16 96 %  11/30/17 1200 137/69 - - 61 16 97 %  11/30/17 1156 (!) 143/70 - - 62 17 97 %  11/30/17 1145 138/70 - - 61 14 99 %  11/30/17 1141 138/70 - - (!) 59 20 99 %  11/30/17 1126 139/68 - - (!) 58 14 98 %  11/30/17 1111 135/72 - - (!) 58 10 100 %  11/30/17 1056 127/65 - - (!) 58 15 98 %  11/30/17 1042 120/68 (!) 97 F (36.1 C) - 61 (!) 22 100 %  11/30/17 0810 (!) 141/69 - - (!) 54 12 99 %  11/30/17 0805 136/62 - - (!) 59 17 97 %  11/30/17 0800 132/64 - - (!) 57 14 99 %  11/30/17 0755 (!) 168/92 - - 65 11 99 %     @flow {1959:LAST@   Intake/Output from previous day:   01/07 0701 - 01/08 0700 In: 1290 [P.O.:240; I.V.:1000] Out: 5325 [Urine:5275]   Intake/Output this shift:   No intake/output data recorded.   Intake/Output      01/07 0701 - 01/08 0700 01/08 0701 - 01/09 0700   P.O. 240    I.V. (mL/kg) 1000 (10)    IV Piggyback 50    Total Intake(mL/kg) 1290 (12.9)    Urine (mL/kg/hr) 5275 (2.2)    Blood 50    Total Output 5325    Net -9381  LABORATORY DATA: No results for input(s): WBC, HGB, HCT, PLT in the last 168 hours. No results for input(s): NA, K, CL, CO2, BUN, CREATININE, GLUCOSE, CALCIUM in the last 168 hours. No results found for: INR, PROTIME  Examination:  General appearance: alert, cooperative and no distress Extremities: extremities normal, atraumatic, no cyanosis or edema  Wound Exam: clean, dry, intact   Drainage:  None: wound tissue dry  Motor Exam: Quadriceps and Hamstrings Intact  Sensory Exam: Superficial Peroneal, Deep Peroneal and Tibial normal   Assessment:    1 Day Post-Op  Procedure(s) (LRB): RIGHT TOTAL KNEE ARTHROPLASTY (Right)  ADDITIONAL DIAGNOSIS:  Active Problems:   S/P total knee replacement    Plan: Physical Therapy as ordered Weight Bearing as Tolerated (WBAT)  DVT Prophylaxis:  Aspirin  DISCHARGE PLAN: Home  DISCHARGE NEEDS: HHPT   Patient doing well, expected D/C home today         Donia Ast 12/01/2017, 7:01 AM

## 2017-12-01 NOTE — Evaluation (Signed)
Occupational Therapy Evaluation Patient Details Name: Johnathan Barker MRN: [id number] DOB: [birth year] Today's Date: 12/01/2017    History of Present Illness pt is a 77 y/o male with pmh significant for knee arthritis , DM2, HTN, admitted with history of right knee pain, s/p R TKA.   Clinical Impression   Patient evaluated by Occupational Therapy with no further acute OT needs identified. All education has been completed and the patient has no further questions. See below for any follow-up Occupational Therapy or equipment needs. OT to sign off. Thank you for referral.      Follow Up Recommendations  No OT follow up    Equipment Recommendations  None recommended by OT    Recommendations for Other Services       Precautions / Restrictions Precautions Precautions: Knee Precaution Booklet Issued: Yes (comment) Precaution Comments: reviewed knee extension at all times/ use of blue block Restrictions Weight Bearing Restrictions: Yes RLE Weight Bearing: Weight bearing as tolerated      Mobility Bed Mobility Overal bed mobility: Modified Independent             General bed mobility comments: in chair on arrival  Transfers Overall transfer level: Needs assistance Equipment used: Rolling walker (2 wheeled) Transfers: Sit to/from Stand Sit to Stand: Min guard         General transfer comment: min guard for safety    Balance Overall balance assessment: No apparent balance deficits (not formally assessed)                                         ADL either performed or assessed with clinical judgement   ADL Overall ADL's : Modified independent                                       General ADL Comments: educated with demo of shower transfer, son/ wife present for all education. educated on showering and wrapping dressing to prevent water from wetting bandage. pt dressed on arrival and correct reports dressing R LE first. Son  correcting patient several times during sessionl. wife with recent knee partial replacement. pt educated on car transfer  Educated patient on knee full extension with return demonstration, educated tub/shower transfer,never to wash directly on incision site, avoid water under bandage and benefits of wrapping dressing, sleeping positioning, avoid putting pillow under knee and  Car transfer.     Vision Patient Visual Report: No change from baseline       Perception     Praxis      Pertinent Vitals/Pain Pain Assessment: Faces Faces Pain Scale: Hurts a little bit Pain Location: R knee Pain Descriptors / Indicators: Sore Pain Intervention(s): Monitored during session;Premedicated before session;Repositioned     Hand Dominance Right   Extremity/Trunk Assessment Upper Extremity Assessment Upper Extremity Assessment: Overall WFL for tasks assessed   Lower Extremity Assessment Lower Extremity Assessment: Defer to PT evaluation   Cervical / Trunk Assessment Cervical / Trunk Assessment: Normal   Communication Communication Communication: No difficulties   Cognition Arousal/Alertness: Awake/alert Behavior During Therapy: WFL for tasks assessed/performed Overall Cognitive Status: Within Functional Limits for tasks assessed  General Comments  wife present during session    Exercises Exercises: Total Joint Total Joint Exercises Ankle Circles/Pumps: AROM;Right;10 reps;Seated Quad Sets: AROM;Strengthening;10 reps;Supine;Right Short Arc Quad: AROM;Supine;Right;10 reps Heel Slides: AROM;Right;10 reps;Supine Hip ABduction/ADduction: AROM;Right;Supine;10 reps Straight Leg Raises: AROM;Right;10 reps;Supine Long Arc Quad: AROM;Right;10 reps;Seated Knee Flexion: AROM;Right;10 reps;Seated Goniometric ROM: approx 10-90   Shoulder Instructions      Home Living Family/patient expects to be discharged to:: Private residence Living  Arrangements: Spouse/significant other Available Help at Discharge: Family;Available 24 hours/day Type of Home: House Home Access: Stairs to enter [employer] of Steps: 2 Entrance Stairs-Rails: Left;Right Home Layout: One level     Bathroom Shower/Tub: Tub/shower unit;Walk-in shower(uses walk in)   Bathroom Toilet: Handicapped height Bathroom Accessibility: Yes   Home Equipment: Environmental consultant - 2 wheels;Bedside commode;Shower seat - built in          Prior Functioning/Environment Level of Independence: Independent                 OT Problem List:        OT Treatment/Interventions:      OT Goals(Current goals can be found in the care plan section) Acute Rehab OT Goals Patient Stated Goal: home and back independent  OT Frequency:     Barriers to D/C:            Co-evaluation              AM-PAC PT "6 Clicks" Daily Activity     Outcome Measure Help from another person eating meals?: None Help from another person taking care of personal grooming?: A Little Help from another person toileting, which includes using toliet, bedpan, or urinal?: A Little Help from another person bathing (including washing, rinsing, drying)?: A Little Help from another person to put on and taking off regular upper body clothing?: None Help from another person to put on and taking off regular lower body clothing?: A Little 6 Click Score: 20   End of Session CPM Right Knee CPM Right Knee: Off Nurse Communication: Mobility status;Precautions  Activity Tolerance: Patient tolerated treatment well Patient left: in chair;with call bell/phone within reach                   Time: 1040-1105 OT Time Calculation (min): 25 min Charges:  OT General Charges $OT Visit: 1 Visit OT Evaluation $OT Eval Moderate Complexity: 1 Mod G-Codes:      Jeri Modena   OTR/L Pager: [phone] Office: [phone] .   Parke Poisson B 12/01/2017, 3:09 PM

## 2017-12-02 DIAGNOSIS — E119 Type 2 diabetes mellitus without complications: Secondary | ICD-10-CM | POA: Diagnosis not present

## 2017-12-02 DIAGNOSIS — Z471 Aftercare following joint replacement surgery: Secondary | ICD-10-CM | POA: Diagnosis not present

## 2017-12-02 DIAGNOSIS — I10 Essential (primary) hypertension: Secondary | ICD-10-CM | POA: Diagnosis not present

## 2017-12-02 DIAGNOSIS — Z96651 Presence of right artificial knee joint: Secondary | ICD-10-CM | POA: Diagnosis not present

## 2017-12-02 DIAGNOSIS — Z7984 Long term (current) use of oral hypoglycemic drugs: Secondary | ICD-10-CM | POA: Diagnosis not present

## 2017-12-02 DIAGNOSIS — M1712 Unilateral primary osteoarthritis, left knee: Secondary | ICD-10-CM | POA: Diagnosis not present

## 2017-12-03 DIAGNOSIS — I10 Essential (primary) hypertension: Secondary | ICD-10-CM | POA: Diagnosis not present

## 2017-12-03 DIAGNOSIS — E119 Type 2 diabetes mellitus without complications: Secondary | ICD-10-CM | POA: Diagnosis not present

## 2017-12-03 DIAGNOSIS — Z96651 Presence of right artificial knee joint: Secondary | ICD-10-CM | POA: Diagnosis not present

## 2017-12-03 DIAGNOSIS — Z7984 Long term (current) use of oral hypoglycemic drugs: Secondary | ICD-10-CM | POA: Diagnosis not present

## 2017-12-03 DIAGNOSIS — M1712 Unilateral primary osteoarthritis, left knee: Secondary | ICD-10-CM | POA: Diagnosis not present

## 2017-12-03 DIAGNOSIS — Z471 Aftercare following joint replacement surgery: Secondary | ICD-10-CM | POA: Diagnosis not present

## 2017-12-04 DIAGNOSIS — Z471 Aftercare following joint replacement surgery: Secondary | ICD-10-CM | POA: Diagnosis not present

## 2017-12-04 DIAGNOSIS — I10 Essential (primary) hypertension: Secondary | ICD-10-CM | POA: Diagnosis not present

## 2017-12-04 DIAGNOSIS — M1712 Unilateral primary osteoarthritis, left knee: Secondary | ICD-10-CM | POA: Diagnosis not present

## 2017-12-04 DIAGNOSIS — E119 Type 2 diabetes mellitus without complications: Secondary | ICD-10-CM | POA: Diagnosis not present

## 2017-12-04 DIAGNOSIS — Z7984 Long term (current) use of oral hypoglycemic drugs: Secondary | ICD-10-CM | POA: Diagnosis not present

## 2017-12-04 DIAGNOSIS — Z96651 Presence of right artificial knee joint: Secondary | ICD-10-CM | POA: Diagnosis not present

## 2017-12-07 DIAGNOSIS — Z471 Aftercare following joint replacement surgery: Secondary | ICD-10-CM | POA: Diagnosis not present

## 2017-12-07 DIAGNOSIS — M1712 Unilateral primary osteoarthritis, left knee: Secondary | ICD-10-CM | POA: Diagnosis not present

## 2017-12-07 DIAGNOSIS — E119 Type 2 diabetes mellitus without complications: Secondary | ICD-10-CM | POA: Diagnosis not present

## 2017-12-07 DIAGNOSIS — Z7984 Long term (current) use of oral hypoglycemic drugs: Secondary | ICD-10-CM | POA: Diagnosis not present

## 2017-12-07 DIAGNOSIS — I10 Essential (primary) hypertension: Secondary | ICD-10-CM | POA: Diagnosis not present

## 2017-12-07 DIAGNOSIS — Z96651 Presence of right artificial knee joint: Secondary | ICD-10-CM | POA: Diagnosis not present

## 2017-12-08 DIAGNOSIS — Z96651 Presence of right artificial knee joint: Secondary | ICD-10-CM | POA: Diagnosis not present

## 2017-12-09 DIAGNOSIS — E119 Type 2 diabetes mellitus without complications: Secondary | ICD-10-CM | POA: Diagnosis not present

## 2017-12-09 DIAGNOSIS — Z471 Aftercare following joint replacement surgery: Secondary | ICD-10-CM | POA: Diagnosis not present

## 2017-12-09 DIAGNOSIS — Z7984 Long term (current) use of oral hypoglycemic drugs: Secondary | ICD-10-CM | POA: Diagnosis not present

## 2017-12-09 DIAGNOSIS — M1712 Unilateral primary osteoarthritis, left knee: Secondary | ICD-10-CM | POA: Diagnosis not present

## 2017-12-09 DIAGNOSIS — Z96651 Presence of right artificial knee joint: Secondary | ICD-10-CM | POA: Diagnosis not present

## 2017-12-09 DIAGNOSIS — I10 Essential (primary) hypertension: Secondary | ICD-10-CM | POA: Diagnosis not present

## 2017-12-11 DIAGNOSIS — M25561 Pain in right knee: Secondary | ICD-10-CM | POA: Diagnosis not present

## 2017-12-14 DIAGNOSIS — M25561 Pain in right knee: Secondary | ICD-10-CM | POA: Diagnosis not present

## 2017-12-16 DIAGNOSIS — M25561 Pain in right knee: Secondary | ICD-10-CM | POA: Diagnosis not present

## 2017-12-18 DIAGNOSIS — M25561 Pain in right knee: Secondary | ICD-10-CM | POA: Diagnosis not present

## 2017-12-21 DIAGNOSIS — M25561 Pain in right knee: Secondary | ICD-10-CM | POA: Diagnosis not present

## 2017-12-23 DIAGNOSIS — M25561 Pain in right knee: Secondary | ICD-10-CM | POA: Diagnosis not present

## 2017-12-25 DIAGNOSIS — M25561 Pain in right knee: Secondary | ICD-10-CM | POA: Diagnosis not present

## 2017-12-28 DIAGNOSIS — M25561 Pain in right knee: Secondary | ICD-10-CM | POA: Diagnosis not present

## 2017-12-30 DIAGNOSIS — M25561 Pain in right knee: Secondary | ICD-10-CM | POA: Diagnosis not present

## 2018-01-01 DIAGNOSIS — M25561 Pain in right knee: Secondary | ICD-10-CM | POA: Diagnosis not present

## 2018-01-06 DIAGNOSIS — M25561 Pain in right knee: Secondary | ICD-10-CM | POA: Diagnosis not present

## 2018-01-08 DIAGNOSIS — M25561 Pain in right knee: Secondary | ICD-10-CM | POA: Diagnosis not present

## 2018-01-11 DIAGNOSIS — M25561 Pain in right knee: Secondary | ICD-10-CM | POA: Diagnosis not present

## 2018-01-13 DIAGNOSIS — M25561 Pain in right knee: Secondary | ICD-10-CM | POA: Diagnosis not present

## 2018-01-15 DIAGNOSIS — M25561 Pain in right knee: Secondary | ICD-10-CM | POA: Diagnosis not present

## 2018-01-18 DIAGNOSIS — M25561 Pain in right knee: Secondary | ICD-10-CM | POA: Diagnosis not present

## 2018-01-20 DIAGNOSIS — M25561 Pain in right knee: Secondary | ICD-10-CM | POA: Diagnosis not present

## 2018-01-22 DIAGNOSIS — M25561 Pain in right knee: Secondary | ICD-10-CM | POA: Diagnosis not present

## 2018-01-25 DIAGNOSIS — M25561 Pain in right knee: Secondary | ICD-10-CM | POA: Diagnosis not present

## 2018-01-27 DIAGNOSIS — M25561 Pain in right knee: Secondary | ICD-10-CM | POA: Diagnosis not present

## 2018-01-29 DIAGNOSIS — M25561 Pain in right knee: Secondary | ICD-10-CM | POA: Diagnosis not present

## 2018-02-03 DIAGNOSIS — M25561 Pain in right knee: Secondary | ICD-10-CM | POA: Diagnosis not present

## 2018-02-05 DIAGNOSIS — M25561 Pain in right knee: Secondary | ICD-10-CM | POA: Diagnosis not present

## 2018-02-08 DIAGNOSIS — M25561 Pain in right knee: Secondary | ICD-10-CM | POA: Diagnosis not present

## 2018-02-10 DIAGNOSIS — M25561 Pain in right knee: Secondary | ICD-10-CM | POA: Diagnosis not present

## 2018-02-12 DIAGNOSIS — M25561 Pain in right knee: Secondary | ICD-10-CM | POA: Diagnosis not present

## 2018-02-15 DIAGNOSIS — M25561 Pain in right knee: Secondary | ICD-10-CM | POA: Diagnosis not present

## 2018-02-17 DIAGNOSIS — M25561 Pain in right knee: Secondary | ICD-10-CM | POA: Diagnosis not present

## 2018-02-19 DIAGNOSIS — M25561 Pain in right knee: Secondary | ICD-10-CM | POA: Diagnosis not present

## 2018-02-22 DIAGNOSIS — M25561 Pain in right knee: Secondary | ICD-10-CM | POA: Diagnosis not present

## 2018-02-25 DIAGNOSIS — I10 Essential (primary) hypertension: Secondary | ICD-10-CM | POA: Diagnosis not present

## 2018-02-25 DIAGNOSIS — Z6829 Body mass index (BMI) 29.0-29.9, adult: Secondary | ICD-10-CM | POA: Diagnosis not present

## 2018-02-25 DIAGNOSIS — E785 Hyperlipidemia, unspecified: Secondary | ICD-10-CM | POA: Diagnosis not present

## 2018-02-25 DIAGNOSIS — E1165 Type 2 diabetes mellitus with hyperglycemia: Secondary | ICD-10-CM | POA: Diagnosis not present

## 2018-06-01 DIAGNOSIS — Z96651 Presence of right artificial knee joint: Secondary | ICD-10-CM | POA: Diagnosis not present

## 2018-06-01 DIAGNOSIS — Z471 Aftercare following joint replacement surgery: Secondary | ICD-10-CM | POA: Diagnosis not present

## 2018-06-04 DIAGNOSIS — E785 Hyperlipidemia, unspecified: Secondary | ICD-10-CM | POA: Diagnosis not present

## 2018-06-04 DIAGNOSIS — I10 Essential (primary) hypertension: Secondary | ICD-10-CM | POA: Diagnosis not present

## 2018-06-04 DIAGNOSIS — E1165 Type 2 diabetes mellitus with hyperglycemia: Secondary | ICD-10-CM | POA: Diagnosis not present

## 2018-06-04 DIAGNOSIS — Z1331 Encounter for screening for depression: Secondary | ICD-10-CM | POA: Diagnosis not present

## 2018-06-04 DIAGNOSIS — Z125 Encounter for screening for malignant neoplasm of prostate: Secondary | ICD-10-CM | POA: Diagnosis not present

## 2018-06-21 ENCOUNTER — Other Ambulatory Visit: Payer: Self-pay | Admitting: Orthopedic Surgery

## 2018-08-20 NOTE — Patient Instructions (Addendum)
Johnathan Barker  08/20/2018   Your procedure is scheduled on: 08-30-18     Report to [facility] Main  Entrance    Report to Admitting at 5:30 AM    Call this number if you have problems the morning of surgery [phone]     Remember: Do not eat food or drink liquids :After Midnight.    BRUSH YOUR TEETH MORNING OF SURGERY AND RINSE YOUR MOUTH OUT, NO CHEWING GUM CANDY OR MINTS.     Take these medicines the morning of surgery with A SIP OF WATER: Metoprolol Succinate (Toprol-XL), Atorvastatin (Lipitor)   DO NOT TAKE ANY DIABETIC MEDICATIONS DAY OF YOUR SURGERY                               You may not have any metal on your body including hair pins and              piercings  Do not wear jewelry, make-up, lotions, powders or perfumes, deodorant             Men may shave face and neck.   Do not bring valuables to the hospital. [place].  Contacts, dentures or bridgework may not be worn into surgery.  Leave suitcase in the car. After surgery it may be brought to your room.      Special Instructions: N/A              Please read over the following fact sheets you were given: _____________________________________________________________________             How to Manage Your Diabetes Before and After Surgery  Why is it important to control my blood sugar before and after surgery? . Improving blood sugar levels before and after surgery helps healing and can limit problems. . A way of improving blood sugar control is eating a healthy diet by: o  Eating less sugar and carbohydrates o  Increasing activity/exercise o  Talking with your doctor about reaching your blood sugar goals . High blood sugars (greater than 180 mg/dL) can raise your risk of infections and slow your recovery, so you will need to focus on controlling your diabetes during the weeks before surgery. . Make sure that the doctor who takes  care of your diabetes knows about your planned surgery including the date and location.  How do I manage my blood sugar before surgery? . Check your blood sugar at least 4 times a day, starting 2 days before surgery, to make sure that the level is not too high or low. o Check your blood sugar the morning of your surgery when you wake up and every 2 hours until you get to the Short Stay unit. . If your blood sugar is less than 70 mg/dL, you will need to treat for low blood sugar: o Do not take insulin. o Treat a low blood sugar (less than 70 mg/dL) with  cup of clear juice (cranberry or apple), 4 glucose tablets, OR glucose gel. o Recheck blood sugar in 15 minutes after treatment (to make sure it is greater than 70 mg/dL). If your blood sugar is not greater than 70 mg/dL on recheck, call [phone] for further instructions. . Report your blood sugar to  the short stay nurse when you get to Short Stay.  . If you are admitted to the hospital after surgery: o Your blood sugar will be checked by the staff and you will probably be given insulin after surgery (instead of oral diabetes medicines) to make sure you have good blood sugar levels. o The goal for blood sugar control after surgery is 80-180 mg/dL.   WHAT DO I DO ABOUT MY DIABETES MEDICATION?  Marland Kitchen Do not take oral diabetes medicines (pills) the morning of surgery.  . THE DAY BEFORE SURGERY, take only your morning dose of Glimepiride (Amaryl), and [place]. You may take your usual dose of Metformin              Reviewed and Endorsed by [facility] Patient Education Committee, August 2015    [facility] - Preparing for Surgery Before surgery, you can play an important role.  Because skin is not sterile, your skin needs to be as free of germs as possible.  You can reduce the number of germs on your skin by washing with CHG (chlorahexidine gluconate) soap before surgery.  CHG is an antiseptic cleaner which kills germs and bonds with the  skin to continue killing germs even after washing. Please DO NOT use if you have an allergy to CHG or antibacterial soaps.  If your skin becomes reddened/irritated stop using the CHG and inform your nurse when you arrive at Short Stay. Do not shave (including legs and underarms) for at least 48 hours prior to the first CHG shower.  You may shave your face/neck. Please follow these instructions carefully:  1.  Shower with CHG Soap the night before surgery and the  morning of Surgery.  2.  If you choose to wash your hair, wash your hair first as usual with your  normal  shampoo.  3.  After you shampoo, rinse your hair and body thoroughly to remove the  shampoo.                           4.  Use CHG as you would any other liquid soap.  You can apply chg directly  to the skin and wash                       Gently with a scrungie or clean washcloth.  5.  Apply the CHG Soap to your body ONLY FROM THE NECK DOWN.   Do not use on face/ open                           Wound or open sores. Avoid contact with eyes, ears mouth and genitals (private parts).                       Wash face,  Genitals (private parts) with your normal soap.             6.  Wash thoroughly, paying special attention to the area where your surgery  will be performed.  7.  Thoroughly rinse your body with warm water from the neck down.  8.  DO NOT shower/wash with your normal soap after using and rinsing off  the CHG Soap.                9.  Pat yourself dry with a clean towel.  10.  Wear clean pajamas.            11.  Place clean sheets on your bed the night of your first shower and do not  sleep with pets. Day of Surgery : Do not apply any lotions/deodorants the morning of surgery.  Please wear clean clothes to the hospital/surgery center.  FAILURE TO FOLLOW THESE INSTRUCTIONS MAY RESULT IN THE CANCELLATION OF YOUR SURGERY PATIENT SIGNATURE_________________________________  NURSE  SIGNATURE__________________________________  ________________________________________________________________________

## 2018-08-23 ENCOUNTER — Other Ambulatory Visit: Payer: Self-pay

## 2018-08-23 ENCOUNTER — Encounter (HOSPITAL_COMMUNITY): Payer: Self-pay

## 2018-08-23 ENCOUNTER — Encounter (HOSPITAL_COMMUNITY)
Admission: RE | Admit: 2018-08-23 | Discharge: 2018-08-23 | Disposition: A | Discharge status: Home / Self Care | Payer: Medicare Other | Source: Ambulatory Visit | Attending: Orthopedic Surgery | Admitting: Orthopedic Surgery

## 2018-08-23 DIAGNOSIS — R001 Bradycardia, unspecified: Secondary | ICD-10-CM | POA: Insufficient documentation

## 2018-08-23 DIAGNOSIS — Z01818 Encounter for other preprocedural examination: Secondary | ICD-10-CM | POA: Diagnosis not present

## 2018-08-23 DIAGNOSIS — E119 Type 2 diabetes mellitus without complications: Secondary | ICD-10-CM | POA: Diagnosis not present

## 2018-08-23 DIAGNOSIS — I10 Essential (primary) hypertension: Secondary | ICD-10-CM | POA: Insufficient documentation

## 2018-08-23 LAB — COMPREHENSIVE METABOLIC PANEL
ALT: 20 U/L (ref 0–44)
ANION GAP: 10 (ref 5–15)
AST: 23 U/L (ref 15–41)
Albumin: 4 g/dL (ref 3.5–5.0)
Alkaline Phosphatase: 53 U/L (ref 38–126)
BILIRUBIN TOTAL: 1.9 mg/dL — AB (ref 0.3–1.2)
BUN: 20 mg/dL (ref 8–23)
CALCIUM: 9.7 mg/dL (ref 8.9–10.3)
CO2: 29 mmol/L (ref 22–32)
Chloride: 105 mmol/L (ref 98–111)
Creatinine, Ser: 0.93 mg/dL (ref 0.61–1.24)
Glucose, Bld: 76 mg/dL (ref 70–99)
Potassium: 4.2 mmol/L (ref 3.5–5.1)
SODIUM: 144 mmol/L (ref 135–145)
TOTAL PROTEIN: 7.5 g/dL (ref 6.5–8.1)

## 2018-08-23 LAB — CBC WITH DIFFERENTIAL/PLATELET
BASOS PCT: 0 %
Basophils Absolute: 0 10*3/uL (ref 0.0–0.1)
EOS ABS: 0.4 10*3/uL (ref 0.0–0.7)
EOS PCT: 4 %
HCT: 41.4 % (ref 39.0–52.0)
Hemoglobin: 13.9 g/dL (ref 13.0–17.0)
LYMPHS ABS: 2.3 10*3/uL (ref 0.7–4.0)
Lymphocytes Relative: 23 %
MCH: 30.5 pg (ref 26.0–34.0)
MCHC: 33.6 g/dL (ref 30.0–36.0)
MCV: 90.8 fL (ref 78.0–100.0)
Monocytes Absolute: 0.9 10*3/uL (ref 0.1–1.0)
Monocytes Relative: 9 %
Neutro Abs: 6.4 10*3/uL (ref 1.7–7.7)
Neutrophils Relative %: 64 %
Platelets: 345 10*3/uL (ref 150–400)
RBC: 4.56 MIL/uL (ref 4.22–5.81)
RDW: 13.2 % (ref 11.5–15.5)
WBC: 10.1 10*3/uL (ref 4.0–10.5)

## 2018-08-23 LAB — GLUCOSE, CAPILLARY: GLUCOSE-CAPILLARY: 58 mg/dL — AB (ref 70–99)

## 2018-08-23 LAB — SURGICAL PCR SCREEN
MRSA, PCR: NEGATIVE
STAPHYLOCOCCUS AUREUS: NEGATIVE

## 2018-08-24 LAB — HEMOGLOBIN A1C
Hgb A1c MFr Bld: 6.6 % — ABNORMAL HIGH (ref 4.8–5.6)
Mean Plasma Glucose: 143 mg/dL

## 2018-08-29 MED ORDER — BUPIVACAINE LIPOSOME 1.3 % IJ SUSP
20.0000 mL | Freq: Once | INTRAMUSCULAR | Status: DC
  Filled 2018-08-29: qty 20

## 2018-08-30 ENCOUNTER — Ambulatory Visit (HOSPITAL_COMMUNITY)
Admission: RE | Admit: 2018-08-30 | Discharge: 2018-08-30 | Disposition: A | Discharge status: Home / Self Care | Payer: Medicare Other | Source: Other Acute Inpatient Hospital | Attending: Orthopedic Surgery | Admitting: Orthopedic Surgery

## 2018-08-30 ENCOUNTER — Encounter (HOSPITAL_COMMUNITY): Payer: Self-pay

## 2018-08-30 ENCOUNTER — Encounter (HOSPITAL_COMMUNITY)
Admission: RE | Disposition: A | Discharge status: Home / Self Care | Payer: Self-pay | Source: Other Acute Inpatient Hospital | Attending: Orthopedic Surgery

## 2018-08-30 ENCOUNTER — Ambulatory Visit (HOSPITAL_COMMUNITY): Payer: Medicare Other | Admitting: Anesthesiology

## 2018-08-30 ENCOUNTER — Other Ambulatory Visit: Payer: Self-pay

## 2018-08-30 ENCOUNTER — Ambulatory Visit (HOSPITAL_COMMUNITY): Payer: Medicare Other

## 2018-08-30 DIAGNOSIS — I10 Essential (primary) hypertension: Secondary | ICD-10-CM | POA: Diagnosis not present

## 2018-08-30 DIAGNOSIS — I499 Cardiac arrhythmia, unspecified: Secondary | ICD-10-CM | POA: Insufficient documentation

## 2018-08-30 DIAGNOSIS — Z880 Allergy status to penicillin: Secondary | ICD-10-CM | POA: Diagnosis not present

## 2018-08-30 DIAGNOSIS — Z79899 Other long term (current) drug therapy: Secondary | ICD-10-CM | POA: Insufficient documentation

## 2018-08-30 DIAGNOSIS — M1712 Unilateral primary osteoarthritis, left knee: Secondary | ICD-10-CM | POA: Insufficient documentation

## 2018-08-30 DIAGNOSIS — Z7982 Long term (current) use of aspirin: Secondary | ICD-10-CM | POA: Insufficient documentation

## 2018-08-30 DIAGNOSIS — Z96651 Presence of right artificial knee joint: Secondary | ICD-10-CM | POA: Insufficient documentation

## 2018-08-30 DIAGNOSIS — E119 Type 2 diabetes mellitus without complications: Secondary | ICD-10-CM | POA: Insufficient documentation

## 2018-08-30 DIAGNOSIS — Z87442 Personal history of urinary calculi: Secondary | ICD-10-CM | POA: Diagnosis not present

## 2018-08-30 DIAGNOSIS — Z539 Procedure and treatment not carried out, unspecified reason: Secondary | ICD-10-CM | POA: Insufficient documentation

## 2018-08-30 DIAGNOSIS — R569 Unspecified convulsions: Secondary | ICD-10-CM | POA: Insufficient documentation

## 2018-08-30 DIAGNOSIS — Z7984 Long term (current) use of oral hypoglycemic drugs: Secondary | ICD-10-CM | POA: Insufficient documentation

## 2018-08-30 LAB — GLUCOSE, CAPILLARY
Glucose-Capillary: 71 mg/dL (ref 70–99)
Glucose-Capillary: 78 mg/dL (ref 70–99)

## 2018-08-30 LAB — COMPREHENSIVE METABOLIC PANEL
ALBUMIN: 3.7 g/dL (ref 3.5–5.0)
ALK PHOS: 51 U/L (ref 38–126)
ALT: 16 U/L (ref 0–44)
ANION GAP: 10 (ref 5–15)
AST: 21 U/L (ref 15–41)
BILIRUBIN TOTAL: 2 mg/dL — AB (ref 0.3–1.2)
BUN: 20 mg/dL (ref 8–23)
CO2: 24 mmol/L (ref 22–32)
Calcium: 9.3 mg/dL (ref 8.9–10.3)
Chloride: 106 mmol/L (ref 98–111)
Creatinine, Ser: 1.05 mg/dL (ref 0.61–1.24)
GFR calc Af Amer: >60 mL/min (ref >60)
GFR calc non Af Amer: >60 mL/min (ref >60)
GLUCOSE: 171 mg/dL — AB (ref 70–99)
POTASSIUM: 3.9 mmol/L (ref 3.5–5.1)
SODIUM: 140 mmol/L (ref 135–145)
TOTAL PROTEIN: 6.7 g/dL (ref 6.5–8.1)

## 2018-08-30 LAB — CBC
HEMATOCRIT: 36.9 % — AB (ref 39.0–52.0)
HEMOGLOBIN: 12.6 g/dL — AB (ref 13.0–17.0)
MCH: 30.7 pg (ref 26.0–34.0)
MCHC: 34.1 g/dL (ref 30.0–36.0)
MCV: 90 fL (ref 78.0–100.0)
Platelets: 303 10*3/uL (ref 150–400)
RBC: 4.1 MIL/uL — ABNORMAL LOW (ref 4.22–5.81)
RDW: 13.1 % (ref 11.5–15.5)
WBC: 10.1 10*3/uL (ref 4.0–10.5)

## 2018-08-30 IMAGING — CT CT HEAD W/O CM
3 series · 16 of 47 positions shown, 19 images · non-contrast
Comparison: None.

CLINICAL DATA: New onset seizure.

EXAM:
CT HEAD WITHOUT CONTRAST
TECHNIQUE: Contiguous axial images were obtained from the base of the skull
through the vertex without intravenous contrast.

[Series 2: head wo · axial · 0.47mm/px · z∈[+1523,+1648]mm · 10 of 31 slices shown, 13 images]
[im 3/31  brain]
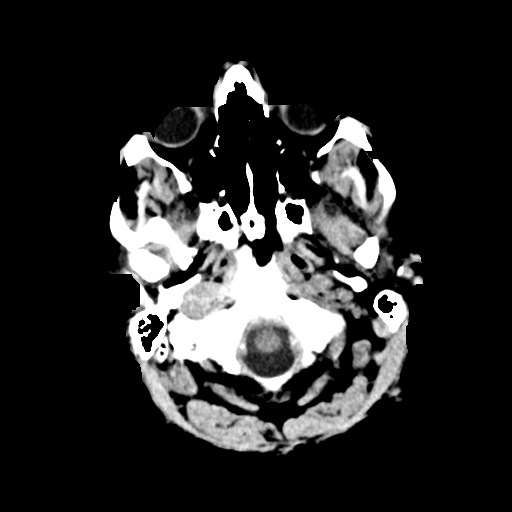
[im 3/31  bone]
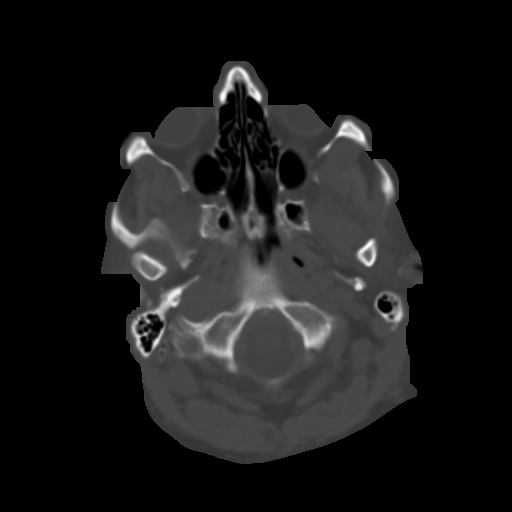
[im 6/31  brain]
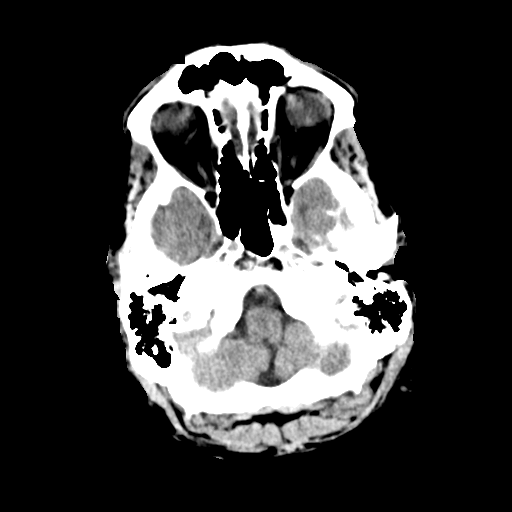
[im 9/31  brain]
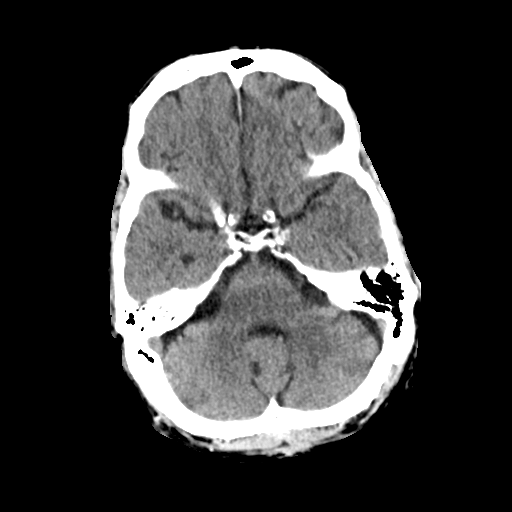
[im 11/31  brain]
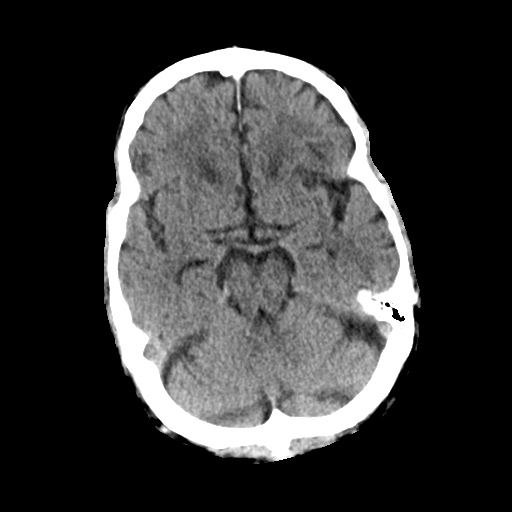
[im 14/31  brain]
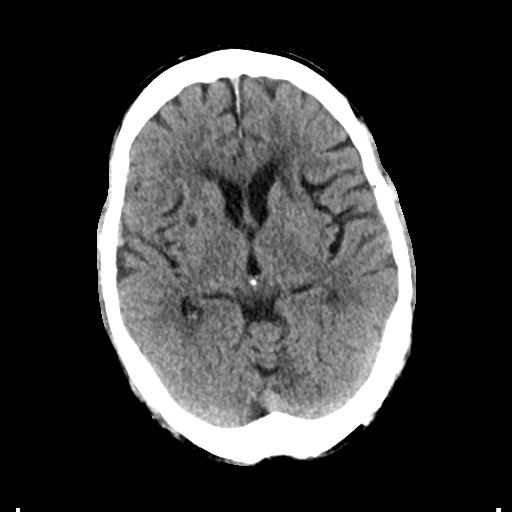
[im 14/31  bone]
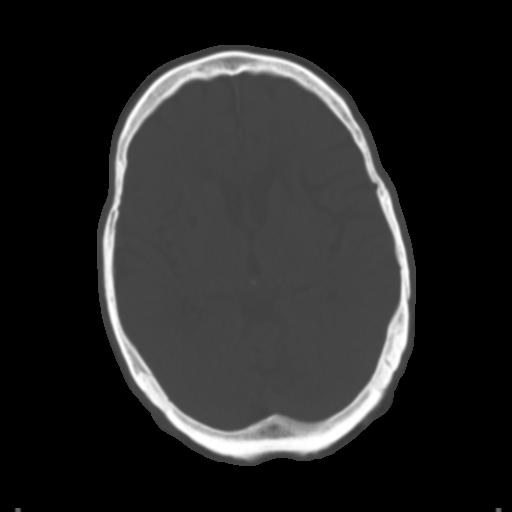
[im 17/31  brain]
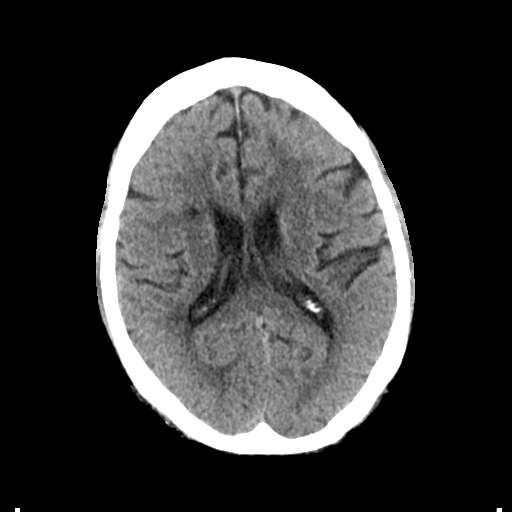
[im 20/31  brain]
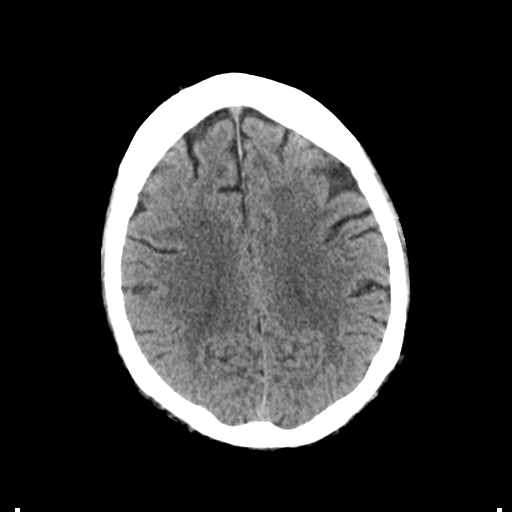
[im 23/31  brain]
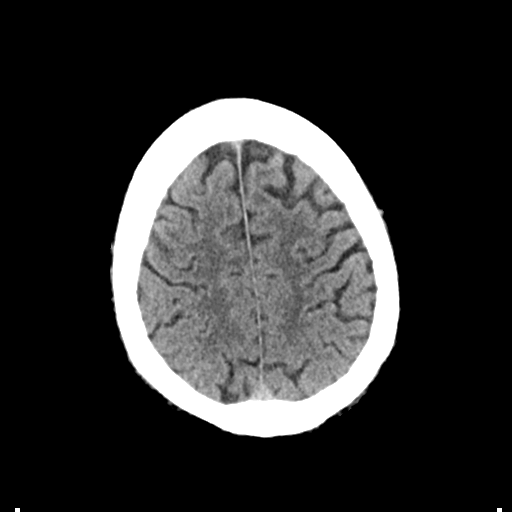
[im 25/31  brain]
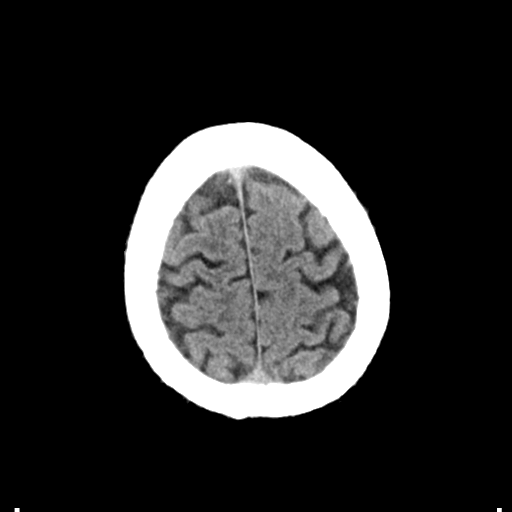
[im 25/31  bone]
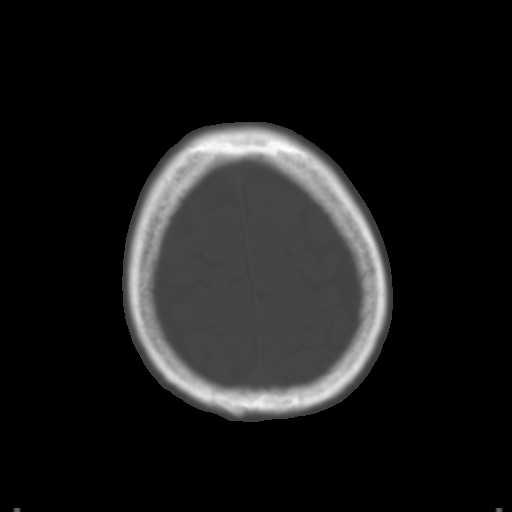
[im 28/31  brain]
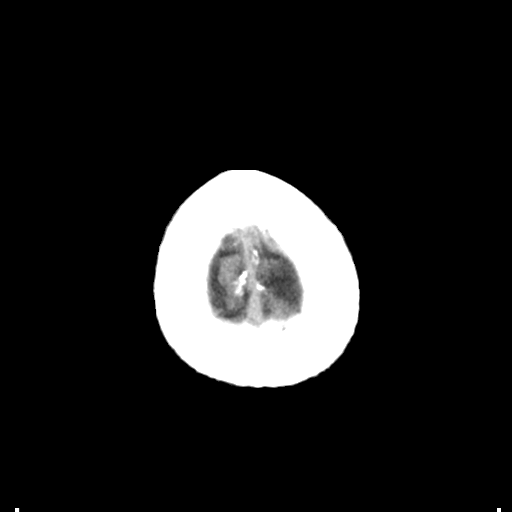

[Series 4: coronal soft tissue · coronal · 0.37mm/px · 3 of 70 slices shown]
[im 24/70  brain]
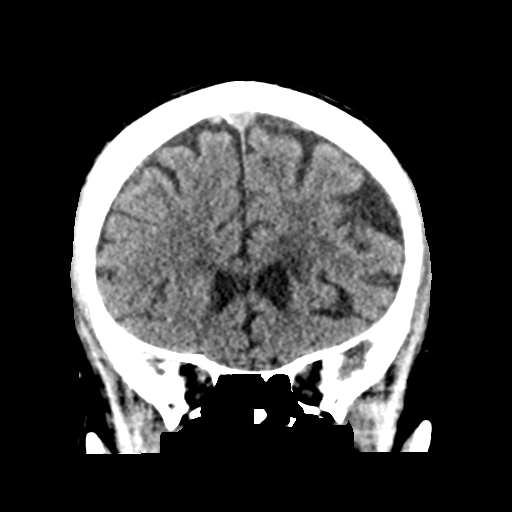
[im 31/70  brain]
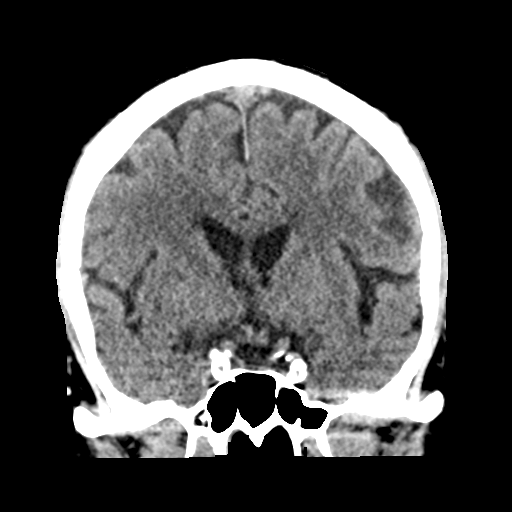
[im 39/70  brain]
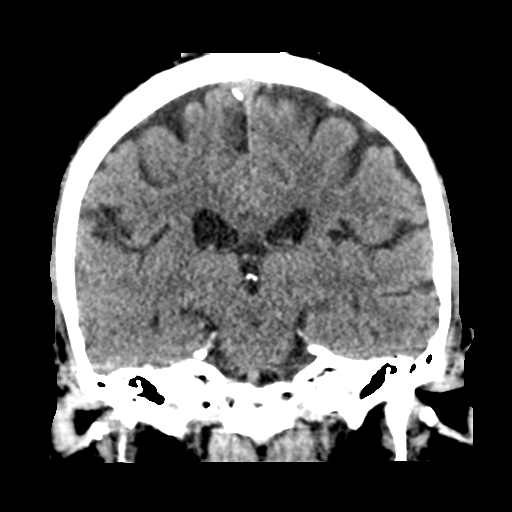

[Series 5: sagittal soft tissue · sagittal · 0.34mm/px · 3 of 57 slices shown]
[im 19/57  brain]
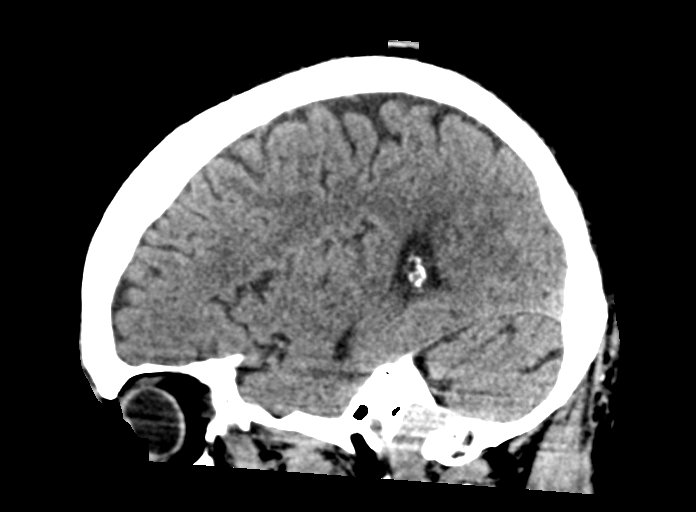
[im 29/57  brain]
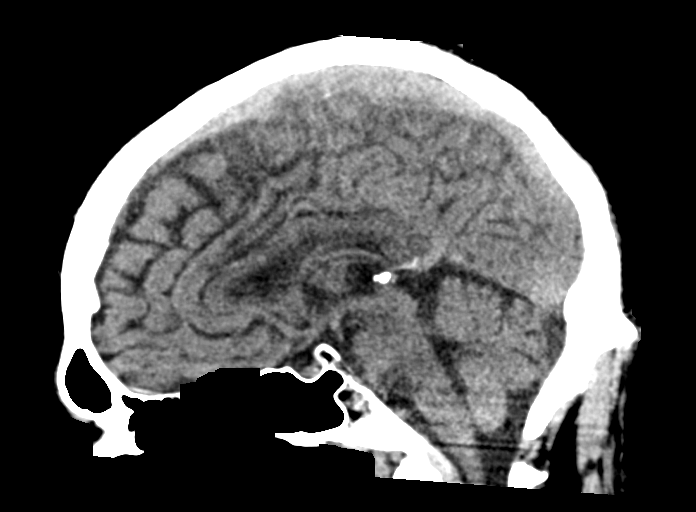
[im 38/57  brain]
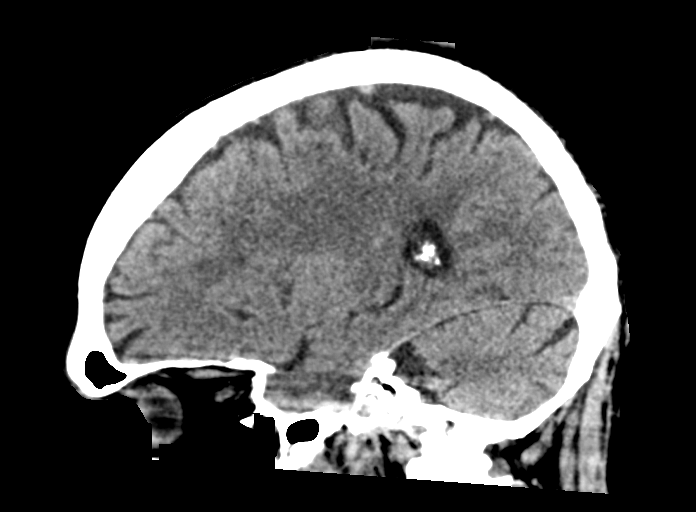

[16 of 47 positions shown; findings below may reference images not displayed]

FINDINGS: Brain: No evidence of acute infarction, hemorrhage, hydrocephalus,
extra-axial collection or mass lesion/mass effect. Small old lacunar
infarcts in the bilateral basal ganglia. Left greater than right
frontal horn periventricular white matter hypodensities are
nonspecific, but favored to reflect chronic microvascular ischemic
changes.

Vascular: Atherosclerotic vascular calcification of the carotid
siphons. No hyperdense vessel.

Skull: Normal. Negative for fracture or focal lesion.

Sinuses/Orbits: No acute finding.

Other: None.
IMPRESSION: 1.  No acute intracranial abnormality.
2. Old basal ganglia lacunar infarcts and mild chronic microvascular
ischemic changes.

## 2018-08-30 SURGERY — ARTHROPLASTY, KNEE, TOTAL
Anesthesia: Spinal | Site: Knee | Laterality: Left

## 2018-08-30 MED ORDER — CHLORHEXIDINE GLUCONATE 4 % EX LIQD: 60.0000 mL | Freq: Once | CUTANEOUS | Status: DC

## 2018-08-30 MED ORDER — LACTATED RINGERS IV SOLN
INTRAVENOUS | Status: DC
  Administered 2018-08-30: 06:00:00 via INTRAVENOUS

## 2018-08-30 MED ORDER — ACETAMINOPHEN 500 MG PO TABS
1000.0000 mg | ORAL_TABLET | Freq: Once | ORAL | Status: AC
  Administered 2018-08-30: 1000 mg via ORAL
  Filled 2018-08-30: qty 2

## 2018-08-30 MED ORDER — GABAPENTIN 300 MG PO CAPS
300.0000 mg | ORAL_CAPSULE | Freq: Once | ORAL | Status: AC
  Administered 2018-08-30: 300 mg via ORAL
  Filled 2018-08-30: qty 1

## 2018-08-30 MED ORDER — DEXAMETHASONE SODIUM PHOSPHATE 10 MG/ML IJ SOLN: 8.0000 mg | Freq: Once | INTRAMUSCULAR | Status: DC

## 2018-08-30 MED ORDER — CEFAZOLIN SODIUM-DEXTROSE 2-4 GM/100ML-% IV SOLN: 2.0000 g | INTRAVENOUS | Status: DC

## 2018-08-30 MED ORDER — BUPIVACAINE HCL (PF) 0.25 % IJ SOLN
INTRAMUSCULAR | Status: AC
  Filled 2018-08-30: qty 30

## 2018-08-30 MED ORDER — SODIUM CHLORIDE 0.9 % IJ SOLN
INTRAMUSCULAR | Status: AC
  Filled 2018-08-30: qty 20

## 2018-08-30 MED ORDER — PROPOFOL 10 MG/ML IV BOLUS
INTRAVENOUS | Status: AC
  Filled 2018-08-30: qty 60

## 2018-08-30 MED ORDER — BUPIVACAINE-EPINEPHRINE (PF) 0.5% -1:200000 IJ SOLN
INTRAMUSCULAR | Status: AC
  Filled 2018-08-30: qty 30

## 2018-08-30 MED ORDER — TRANEXAMIC ACID 1000 MG/10ML IV SOLN
1000.0000 mg | INTRAVENOUS | Status: DC
  Filled 2018-08-30: qty 10

## 2018-08-30 MED ORDER — CLINDAMYCIN PHOSPHATE 900 MG/50ML IV SOLN
INTRAVENOUS | Status: AC
  Filled 2018-08-30: qty 50

## 2018-08-30 MED ORDER — FENTANYL CITRATE (PF) 100 MCG/2ML IJ SOLN
INTRAMUSCULAR | Status: AC | PRN
  Administered 2018-08-30: 50 ug via INTRAVENOUS

## 2018-08-30 MED ORDER — FENTANYL CITRATE (PF) 100 MCG/2ML IJ SOLN
INTRAMUSCULAR | Status: AC
  Filled 2018-08-30: qty 2

## 2018-08-30 NOTE — H&P (Signed)
Johnathan Barker MRN:  [id number] DOB/SEX:  July 12, 1941/male  CHIEF COMPLAINT:  Painful left Knee  HISTORY: Patient is a 77 y.o. male presented with a history of pain in the left knee. Onset of symptoms was gradual starting a few years ago with gradually worsening course since that time. Patient has been treated conservatively with over-the-counter NSAIDs and activity modification. Patient currently rates pain in the knee at 10 out of 10 with activity. There is pain at night.  PAST MEDICAL HISTORY: Patient Active Problem List   Diagnosis Date Noted  . 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 Arthritis    knees  . Diabetes mellitus without complication ([place])    type II  . Dysrhythmia   . History of kidney stones    lithotripsy  . Hypertension    Past Surgical History:  Procedure Laterality Date  . EYE SURGERY Bilateral    cataracts removed= IOL  . TONSILLECTOMY    . TOTAL KNEE ARTHROPLASTY Right 11/30/2017   Procedure: RIGHT TOTAL KNEE ARTHROPLASTY;  Surgeon: Vickey Huger, MD;  Location: [place];  Service: Orthopedics;  Laterality: Right;     MEDICATIONS:   Medications Prior to Admission  Medication Sig Dispense Refill Last Dose  . acetaminophen (TYLENOL) 500 MG tablet Take 500 mg by mouth as needed.   08/29/2018 at Unknown time  . amLODipine (NORVASC) 5 MG tablet Take 5 mg by mouth at bedtime.    08/29/2018 at Unknown time  . aspirin EC 325 MG tablet Take 325-650 mg by mouth every 8 (eight) hours as needed (for pain.).   08/25/2018  . atorvastatin (LIPITOR) 10 MG tablet Take 10 mg by mouth daily.   08/30/2018 at 0430  . dapagliflozin propanediol (FARXIGA) 10 MG TABS tablet Take 10 mg by mouth daily.   08/29/2018 at Unknown time  . glimepiride (AMARYL) 4 MG tablet Take 4 mg by mouth 2 (two) times daily. Morning & evening   08/29/2018 at Unknown time  . hydrochlorothiazide (HYDRODIURIL) 25 MG tablet Take 25 mg by mouth daily.  2 08/29/2018 at Unknown  time  . ibuprofen (ADVIL,MOTRIN) 800 MG tablet Take 800 mg by mouth 3 (three) times daily as needed for pain.  2 Past Month at Unknown time  . metFORMIN (GLUCOPHAGE) 1000 MG tablet Take 1,000 mg by mouth 2 (two) times daily with a meal. Morning & afternoon   08/29/2018  . metoprolol succinate (TOPROL-XL) 50 MG 24 hr tablet Take 50 mg by mouth daily before breakfast. Take with or immediately following a meal.    08/30/2018 at 0430  . Omega-3 Fatty Acids (FISH OIL PO) Take by mouth.   08/29/2018 at Unknown time  . valsartan (DIOVAN) 160 MG tablet Take 320 mg by mouth daily.   99 08/29/2018 at Unknown time    ALLERGIES:   Allergies  Allergen Reactions  . Penicillins Hives, Rash and Other (See Comments)    FEVER  PATIENT HAS HAD A PCN REACTION WITH IMMEDIATE RASH, FACIAL/TONGUE/THROAT SWELLING, SOB, OR LIGHTHEADEDNESS WITH HYPOTENSION:  #  #  #  YES  #  #  #   Has patient had a PCN reaction causing severe rash involving mucus membranes or skin necrosis: No Has patient had a PCN reaction that required hospitalization: No Has patient had a PCN reaction occurring within the last 10 years: No If all of the above answers are "NO", then may proceed with Cephalosporin use.     REVIEW OF  SYSTEMS:  A comprehensive review of systems was negative except for: Musculoskeletal: positive for arthralgias and bone pain   FAMILY HISTORY:  History reviewed. No pertinent family history.  SOCIAL HISTORY:   Social History   Tobacco Use  . Smoking status: Never Smoker  . Smokeless tobacco: Never Used  Substance Use Topics  . Alcohol use: No    Frequency: Never     EXAMINATION:  Vital signs in last 24 hours: Temp:  [98.4 F (36.9 C)] 98.4 F (36.9 C) (10/07 0543) Pulse Rate:  [59] 59 (10/07 0543) Resp:  [18] 18 (10/07 0543) BP: (153)/(73) 153/73 (10/07 0543) SpO2:  [100 %] 100 % (10/07 0543) Weight:  [93.4 kg] 93.4 kg (10/07 0601)  BP (!) 153/73   Pulse (!) 59   Temp 98.4 F (36.9 C) (Oral)    Resp 18   Ht 6' (1.829 m)   Wt 93.4 kg   SpO2 100%   BMI 27.94 kg/m   General Appearance:    Alert, cooperative, no distress, appears stated age  Head:    Normocephalic, without obvious abnormality, atraumatic  Eyes:    PERRL, conjunctiva/corneas clear, EOM's intact, fundi    benign, both eyes       Ears:    Normal TM's and external ear canals, both ears  Nose:   Nares normal, septum midline, mucosa normal, no drainage    or sinus tenderness  Throat:   Lips, mucosa, and tongue normal; teeth and gums normal  Neck:   Supple, symmetrical, trachea midline, no adenopathy;       thyroid:  No enlargement/tenderness/nodules; no carotid   bruit or JVD  Back:     Symmetric, no curvature, ROM normal, no CVA tenderness  Lungs:     Clear to auscultation bilaterally, respirations unlabored  Chest wall:    No tenderness or deformity  Heart:    Regular rate and rhythm, S1 and S2 normal, no murmur, rub   or gallop  Abdomen:     Soft, non-tender, bowel sounds active all four quadrants,    no masses, no organomegaly  Genitalia:    Normal male without lesion, discharge or tenderness  Rectal:    Normal tone, normal prostate, no masses or tenderness;   guaiac negative stool  Extremities:   Extremities normal, atraumatic, no cyanosis or edema  Pulses:   2+ and symmetric all extremities  Skin:   Skin color, texture, turgor normal, no rashes or lesions  Lymph nodes:   Cervical, supraclavicular, and axillary nodes normal  Neurologic:   CNII-XII intact. Normal strength, sensation and reflexes      throughout    Musculoskeletal:  ROM 0-120, Ligaments intact,  Imaging Review Plain radiographs demonstrate severe degenerative joint disease of the left knee. The overall alignment is neutral. The bone quality appears to be good for age and reported activity level.  Assessment/Plan: Primary osteoarthritis, left knee   The patient history, physical examination and imaging studies are consistent with advanced  degenerative joint disease of the left knee. The patient has failed conservative treatment.  The clearance notes were reviewed.  After discussion with the patient it was felt that Total Knee Replacement was indicated. The procedure,  risks, and benefits of total knee arthroplasty were presented and reviewed. The risks including but not limited to aseptic loosening, infection, blood clots, vascular injury, stiffness, patella tracking problems complications among others were discussed. The patient acknowledged the explanation, agreed to proceed with the plan.  Preoperative templating of the  joint replacement has been completed, documented, and submitted to the Operating Room personnel in order to optimize intra-operative equipment management.    Patient's anticipated LOS is less than 2 midnights, meeting these requirements: - Lives within 1 hour of care - Has a competent adult at home to recover with post-op recover - NO history of  - Chronic pain requiring opiods  - Diabetes  - Coronary Artery Disease  - Heart failure  - Heart attack  - Stroke  - DVT/VTE  - Cardiac arrhythmia  - Respiratory Failure/COPD  - Renal failure  - Anemia  - Advanced Liver disease       Donia Ast 08/30/2018, 6:02 AM

## 2018-08-30 NOTE — Progress Notes (Signed)
Procedure cancelled today. Pt discharged in stable condition accompanied by family.  Surgery to be rescheduled by Dr. Ruel Favors office.  Coolidge Breeze, RN 08/30/2018

## 2018-08-30 NOTE — Progress Notes (Signed)
Anesthesiology Note:  Johnathan Barker is a 77 year old male scheduled to undergo left total knee replacement by Dr. Lorre Nick.  He has a history of type 2 diabetes and hypertension.  The anesthetic plan was for spinal anesthesia with preoperative adductor canal block.  The patient underwent an adductor canal block in the pre-operative holding area and received 50 mcg of fentanyl for sedation.  The block was then performed under ultrasound guidance and 20  cc of 0.75% ropivacaine with 0.2 mg of clonidine was injected into the region of the adductor canal.  The femoral artery and needle were well-visualized and anesthetic spread was observed.  Each injection was done in 3 to 5 cc increments using aspiration prior to injection.  All aspirations were negative for blood return.  Approximately 3 minutes after the block was performed the patient developed a grand mal seizure which lasted approximately 30 seconds.  He subsequently was unarousable for approximately 5-10 minutes and then regained consciousness.  After 15 minutes he was fully neurologically intact and alert and oriented.  The seizure was felt to most likely represent intravascular injection of local anesthetic.  However the decision was made to do a noncontrast head CT.  This showed mild chronic vascular microvascular ischemic changes and possible old basilar lacunar infarcts but no acute abnormalities.  No evidence of mass or hemorrhage.was seen. Laboratory studies showed a glucose of 0 78 mg/dL immediately following the seizure.  A c-Met showed a sodium of 140 glucose 143 BUN 20 creatinine 1.05 normal liver function test. The CBC was unremarkable.  The decision was made to postpone the surgery and have him rescheduled in 2 weeks.  I explained what happened to the patient, his wife, and son and told them the seizure was most likely due to the local anesthetic intravascular injection.  I also informed them of the results of the CT scan and laboratory  studies.  They understood and all questions were answered.  [name], MD

## 2018-09-08 ENCOUNTER — Other Ambulatory Visit: Payer: Self-pay | Admitting: Orthopedic Surgery

## 2018-09-09 ENCOUNTER — Encounter (HOSPITAL_COMMUNITY): Payer: Self-pay | Admitting: *Deleted

## 2018-09-10 DIAGNOSIS — I10 Essential (primary) hypertension: Secondary | ICD-10-CM | POA: Diagnosis not present

## 2018-09-10 DIAGNOSIS — Z1339 Encounter for screening examination for other mental health and behavioral disorders: Secondary | ICD-10-CM | POA: Diagnosis not present

## 2018-09-10 DIAGNOSIS — E785 Hyperlipidemia, unspecified: Secondary | ICD-10-CM | POA: Diagnosis not present

## 2018-09-10 DIAGNOSIS — E1165 Type 2 diabetes mellitus with hyperglycemia: Secondary | ICD-10-CM | POA: Diagnosis not present

## 2018-09-12 MED ORDER — BUPIVACAINE LIPOSOME 1.3 % IJ SUSP
20.0000 mL | INTRAMUSCULAR | Status: DC
  Filled 2018-09-12: qty 20

## 2018-09-13 ENCOUNTER — Encounter (HOSPITAL_COMMUNITY): Payer: Self-pay | Admitting: *Deleted

## 2018-09-13 ENCOUNTER — Observation Stay (HOSPITAL_COMMUNITY)
Admission: RE | Admit: 2018-09-13 | Discharge: 2018-09-14 | Disposition: A | Discharge status: Home / Self Care | Payer: Medicare Other | Source: Ambulatory Visit | Attending: Orthopedic Surgery | Admitting: Orthopedic Surgery

## 2018-09-13 ENCOUNTER — Ambulatory Visit (HOSPITAL_COMMUNITY): Payer: Medicare Other | Admitting: Anesthesiology

## 2018-09-13 ENCOUNTER — Other Ambulatory Visit: Payer: Self-pay

## 2018-09-13 ENCOUNTER — Encounter (HOSPITAL_COMMUNITY)
Admission: RE | Disposition: A | Discharge status: Home / Self Care | Payer: Self-pay | Source: Ambulatory Visit | Attending: Orthopedic Surgery

## 2018-09-13 DIAGNOSIS — Z79899 Other long term (current) drug therapy: Secondary | ICD-10-CM | POA: Insufficient documentation

## 2018-09-13 DIAGNOSIS — Z96651 Presence of right artificial knee joint: Secondary | ICD-10-CM | POA: Insufficient documentation

## 2018-09-13 DIAGNOSIS — M1712 Unilateral primary osteoarthritis, left knee: Secondary | ICD-10-CM | POA: Diagnosis not present

## 2018-09-13 DIAGNOSIS — E119 Type 2 diabetes mellitus without complications: Secondary | ICD-10-CM | POA: Insufficient documentation

## 2018-09-13 DIAGNOSIS — I499 Cardiac arrhythmia, unspecified: Secondary | ICD-10-CM | POA: Insufficient documentation

## 2018-09-13 DIAGNOSIS — Z96659 Presence of unspecified artificial knee joint: Secondary | ICD-10-CM

## 2018-09-13 DIAGNOSIS — Z87442 Personal history of urinary calculi: Secondary | ICD-10-CM | POA: Diagnosis not present

## 2018-09-13 DIAGNOSIS — Z7984 Long term (current) use of oral hypoglycemic drugs: Secondary | ICD-10-CM | POA: Insufficient documentation

## 2018-09-13 DIAGNOSIS — I10 Essential (primary) hypertension: Secondary | ICD-10-CM | POA: Diagnosis not present

## 2018-09-13 DIAGNOSIS — Z7982 Long term (current) use of aspirin: Secondary | ICD-10-CM | POA: Diagnosis not present

## 2018-09-13 DIAGNOSIS — G8918 Other acute postprocedural pain: Secondary | ICD-10-CM | POA: Diagnosis not present

## 2018-09-13 DIAGNOSIS — Z880 Allergy status to penicillin: Secondary | ICD-10-CM | POA: Insufficient documentation

## 2018-09-13 HISTORY — PX: TOTAL KNEE ARTHROPLASTY: SHX125

## 2018-09-13 LAB — GLUCOSE, CAPILLARY
GLUCOSE-CAPILLARY: 80 mg/dL (ref 70–99)
GLUCOSE-CAPILLARY: 102 mg/dL — AB (ref 70–99)

## 2018-09-13 SURGERY — ARTHROPLASTY, KNEE, TOTAL
Anesthesia: Spinal | Site: Knee | Laterality: Left

## 2018-09-13 MED ORDER — LACTATED RINGERS IV SOLN
INTRAVENOUS | Status: DC
  Administered 2018-09-13 (×3): via INTRAVENOUS

## 2018-09-13 MED ORDER — PHENYLEPHRINE HCL-NACL 10-0.9 MG/250ML-% IV SOLN
INTRAVENOUS | Status: AC
  Filled 2018-09-13: qty 250

## 2018-09-13 MED ORDER — TRANEXAMIC ACID-NACL 1000-0.7 MG/100ML-% IV SOLN
1000.0000 mg | INTRAVENOUS | Status: DC
  Filled 2018-09-13: qty 100

## 2018-09-13 MED ORDER — DEXAMETHASONE SODIUM PHOSPHATE 10 MG/ML IJ SOLN
10.0000 mg | Freq: Once | INTRAMUSCULAR | Status: AC
  Administered 2018-09-14: 10 mg via INTRAVENOUS

## 2018-09-13 MED ORDER — HYDROMORPHONE HCL 1 MG/ML IJ SOLN: 0.5000 mg | INTRAMUSCULAR | Status: DC | PRN

## 2018-09-13 MED ORDER — BISACODYL 5 MG PO TBEC: 5.0000 mg | DELAYED_RELEASE_TABLET | Freq: Every day | ORAL | Status: DC | PRN

## 2018-09-13 MED ORDER — EPHEDRINE 5 MG/ML INJ
INTRAVENOUS | Status: AC
  Filled 2018-09-13: qty 10

## 2018-09-13 MED ORDER — BUPIVACAINE-EPINEPHRINE 0.5% -1:200000 IJ SOLN
INTRAMUSCULAR | Status: AC
  Filled 2018-09-13: qty 1

## 2018-09-13 MED ORDER — PROPOFOL 10 MG/ML IV BOLUS
INTRAVENOUS | Status: DC | PRN
  Administered 2018-09-13: 20 mg via INTRAVENOUS

## 2018-09-13 MED ORDER — ACETAMINOPHEN 500 MG PO TABS
1000.0000 mg | ORAL_TABLET | Freq: Once | ORAL | Status: AC
  Administered 2018-09-13: 1000 mg via ORAL
  Filled 2018-09-13: qty 2

## 2018-09-13 MED ORDER — STERILE WATER FOR IRRIGATION IR SOLN
Status: DC | PRN
  Administered 2018-09-13: 2000 mL

## 2018-09-13 MED ORDER — PROPOFOL 500 MG/50ML IV EMUL
INTRAVENOUS | Status: DC | PRN
  Administered 2018-09-13: 75 ug/kg/min via INTRAVENOUS

## 2018-09-13 MED ORDER — DEXAMETHASONE SODIUM PHOSPHATE 10 MG/ML IJ SOLN
INTRAMUSCULAR | Status: AC
  Filled 2018-09-13: qty 3

## 2018-09-13 MED ORDER — TRAMADOL HCL 50 MG PO TABS
50.0000 mg | ORAL_TABLET | Freq: Four times a day (QID) | ORAL | Status: DC
  Administered 2018-09-13 – 2018-09-14 (×3): 50 mg via ORAL
  Filled 2018-09-13 (×4): qty 1

## 2018-09-13 MED ORDER — METFORMIN HCL 500 MG PO TABS
1000.0000 mg | ORAL_TABLET | Freq: Two times a day (BID) | ORAL | Status: DC
  Administered 2018-09-13 – 2018-09-14 (×2): 1000 mg via ORAL
  Filled 2018-09-13 (×2): qty 2

## 2018-09-13 MED ORDER — MIDAZOLAM HCL 2 MG/2ML IJ SOLN
INTRAMUSCULAR | Status: AC
  Administered 2018-09-13: 2 mg
  Filled 2018-09-13: qty 2

## 2018-09-13 MED ORDER — METOPROLOL SUCCINATE ER 50 MG PO TB24
50.0000 mg | ORAL_TABLET | Freq: Every day | ORAL | Status: DC
  Administered 2018-09-14: 50 mg via ORAL
  Filled 2018-09-13: qty 1

## 2018-09-13 MED ORDER — IRBESARTAN 150 MG PO TABS
150.0000 mg | ORAL_TABLET | Freq: Every day | ORAL | Status: DC
  Administered 2018-09-13 – 2018-09-14 (×2): 150 mg via ORAL
  Filled 2018-09-13 (×2): qty 1

## 2018-09-13 MED ORDER — SENNOSIDES-DOCUSATE SODIUM 8.6-50 MG PO TABS: 1.0000 | ORAL_TABLET | Freq: Every evening | ORAL | Status: DC | PRN

## 2018-09-13 MED ORDER — ONDANSETRON HCL 4 MG/2ML IJ SOLN
INTRAMUSCULAR | Status: AC
  Filled 2018-09-13: qty 6

## 2018-09-13 MED ORDER — CHLORHEXIDINE GLUCONATE 4 % EX LIQD: 60.0000 mL | Freq: Once | CUTANEOUS | Status: DC

## 2018-09-13 MED ORDER — ROCURONIUM BROMIDE 10 MG/ML (PF) SYRINGE
PREFILLED_SYRINGE | INTRAVENOUS | Status: AC
  Filled 2018-09-13: qty 10

## 2018-09-13 MED ORDER — DOCUSATE SODIUM 100 MG PO CAPS
100.0000 mg | ORAL_CAPSULE | Freq: Two times a day (BID) | ORAL | Status: DC
  Administered 2018-09-13 – 2018-09-14 (×2): 100 mg via ORAL
  Filled 2018-09-13: qty 1

## 2018-09-13 MED ORDER — CLINDAMYCIN PHOSPHATE 600 MG/50ML IV SOLN
600.0000 mg | Freq: Four times a day (QID) | INTRAVENOUS | Status: AC
  Administered 2018-09-13 (×2): 600 mg via INTRAVENOUS
  Filled 2018-09-13 (×2): qty 50

## 2018-09-13 MED ORDER — ONDANSETRON HCL 4 MG/2ML IJ SOLN
INTRAMUSCULAR | Status: DC | PRN
  Administered 2018-09-13: 4 mg via INTRAVENOUS

## 2018-09-13 MED ORDER — AMLODIPINE BESYLATE 5 MG PO TABS
5.0000 mg | ORAL_TABLET | Freq: Every day | ORAL | Status: DC
  Administered 2018-09-13: 5 mg via ORAL
  Filled 2018-09-13: qty 1

## 2018-09-13 MED ORDER — ONDANSETRON HCL 4 MG PO TABS: 4.0000 mg | ORAL_TABLET | Freq: Four times a day (QID) | ORAL | Status: DC | PRN

## 2018-09-13 MED ORDER — ATORVASTATIN CALCIUM 10 MG PO TABS
10.0000 mg | ORAL_TABLET | Freq: Every day | ORAL | Status: DC
  Administered 2018-09-14: 10 mg via ORAL
  Filled 2018-09-13: qty 1

## 2018-09-13 MED ORDER — BUPIVACAINE IN DEXTROSE 0.75-8.25 % IT SOLN
INTRATHECAL | Status: DC | PRN
  Administered 2018-09-13: 1.6 mL via INTRATHECAL

## 2018-09-13 MED ORDER — FENTANYL CITRATE (PF) 100 MCG/2ML IJ SOLN
INTRAMUSCULAR | Status: AC
  Administered 2018-09-13: 100 ug
  Filled 2018-09-13: qty 2

## 2018-09-13 MED ORDER — GABAPENTIN 300 MG PO CAPS
300.0000 mg | ORAL_CAPSULE | Freq: Once | ORAL | Status: AC
  Administered 2018-09-13: 300 mg via ORAL
  Filled 2018-09-13: qty 1

## 2018-09-13 MED ORDER — FLEET ENEMA 7-19 GM/118ML RE ENEM: 1.0000 | ENEMA | Freq: Once | RECTAL | Status: DC | PRN

## 2018-09-13 MED ORDER — PHENOL 1.4 % MT LIQD: 1.0000 | OROMUCOSAL | Status: DC | PRN

## 2018-09-13 MED ORDER — BUPIVACAINE LIPOSOME 1.3 % IJ SUSP
INTRAMUSCULAR | Status: DC | PRN
  Administered 2018-09-13: 20 mL

## 2018-09-13 MED ORDER — DIPHENHYDRAMINE HCL 12.5 MG/5ML PO ELIX: 12.5000 mg | ORAL_SOLUTION | ORAL | Status: DC | PRN

## 2018-09-13 MED ORDER — ACETAMINOPHEN 500 MG PO TABS
1000.0000 mg | ORAL_TABLET | Freq: Four times a day (QID) | ORAL | Status: AC
  Administered 2018-09-13 – 2018-09-14 (×4): 1000 mg via ORAL
  Filled 2018-09-13 (×3): qty 2

## 2018-09-13 MED ORDER — FERROUS SULFATE 325 (65 FE) MG PO TABS
325.0000 mg | ORAL_TABLET | Freq: Three times a day (TID) | ORAL | Status: DC
  Administered 2018-09-14 (×2): 325 mg via ORAL
  Filled 2018-09-13: qty 1

## 2018-09-13 MED ORDER — GABAPENTIN 300 MG PO CAPS
300.0000 mg | ORAL_CAPSULE | Freq: Three times a day (TID) | ORAL | Status: DC
  Administered 2018-09-13 – 2018-09-14 (×3): 300 mg via ORAL
  Filled 2018-09-13 (×2): qty 1

## 2018-09-13 MED ORDER — METHOCARBAMOL 500 MG PO TABS: 500.0000 mg | ORAL_TABLET | Freq: Four times a day (QID) | ORAL | Status: DC | PRN

## 2018-09-13 MED ORDER — 0.9 % SODIUM CHLORIDE (POUR BTL) OPTIME
TOPICAL | Status: DC | PRN
  Administered 2018-09-13: 1000 mL

## 2018-09-13 MED ORDER — GLIMEPIRIDE 4 MG PO TABS
4.0000 mg | ORAL_TABLET | Freq: Two times a day (BID) | ORAL | Status: DC
  Administered 2018-09-13 – 2018-09-14 (×2): 4 mg via ORAL
  Filled 2018-09-13 (×2): qty 1

## 2018-09-13 MED ORDER — DEXAMETHASONE SODIUM PHOSPHATE 10 MG/ML IJ SOLN
8.0000 mg | Freq: Once | INTRAMUSCULAR | Status: AC
  Administered 2018-09-13: 10 mg via INTRAVENOUS

## 2018-09-13 MED ORDER — ONDANSETRON HCL 4 MG/2ML IJ SOLN: 4.0000 mg | Freq: Four times a day (QID) | INTRAMUSCULAR | Status: DC | PRN

## 2018-09-13 MED ORDER — MENTHOL 3 MG MT LOZG: 1.0000 | LOZENGE | OROMUCOSAL | Status: DC | PRN

## 2018-09-13 MED ORDER — FENTANYL CITRATE (PF) 100 MCG/2ML IJ SOLN: 25.0000 ug | INTRAMUSCULAR | Status: DC | PRN

## 2018-09-13 MED ORDER — ONDANSETRON HCL 4 MG/2ML IJ SOLN: 4.0000 mg | Freq: Once | INTRAMUSCULAR | Status: DC | PRN

## 2018-09-13 MED ORDER — TRANEXAMIC ACID-NACL 1000-0.7 MG/100ML-% IV SOLN
1000.0000 mg | Freq: Once | INTRAVENOUS | Status: AC
  Administered 2018-09-13: 1000 mg via INTRAVENOUS
  Filled 2018-09-13: qty 100

## 2018-09-13 MED ORDER — METOCLOPRAMIDE HCL 5 MG PO TABS: 5.0000 mg | ORAL_TABLET | Freq: Three times a day (TID) | ORAL | Status: DC | PRN

## 2018-09-13 MED ORDER — HYDROCHLOROTHIAZIDE 25 MG PO TABS
25.0000 mg | ORAL_TABLET | Freq: Every day | ORAL | Status: DC
  Administered 2018-09-13 – 2018-09-14 (×2): 25 mg via ORAL
  Filled 2018-09-13 (×3): qty 1

## 2018-09-13 MED ORDER — CANAGLIFLOZIN 100 MG PO TABS
100.0000 mg | ORAL_TABLET | Freq: Every day | ORAL | Status: DC
  Administered 2018-09-14: 100 mg via ORAL
  Filled 2018-09-13: qty 1

## 2018-09-13 MED ORDER — METOCLOPRAMIDE HCL 5 MG/ML IJ SOLN: 5.0000 mg | Freq: Three times a day (TID) | INTRAMUSCULAR | Status: DC | PRN

## 2018-09-13 MED ORDER — SODIUM CHLORIDE 0.9% FLUSH
INTRAVENOUS | Status: DC | PRN
  Administered 2018-09-13: 30 mL

## 2018-09-13 MED ORDER — CLINDAMYCIN PHOSPHATE 900 MG/50ML IV SOLN
900.0000 mg | INTRAVENOUS | Status: AC
  Administered 2018-09-13: 900 mg via INTRAVENOUS
  Filled 2018-09-13: qty 50

## 2018-09-13 MED ORDER — LIDOCAINE 2% (20 MG/ML) 5 ML SYRINGE
INTRAMUSCULAR | Status: AC
  Filled 2018-09-13: qty 5

## 2018-09-13 MED ORDER — PROPOFOL 10 MG/ML IV BOLUS
INTRAVENOUS | Status: AC
  Filled 2018-09-13: qty 20

## 2018-09-13 MED ORDER — PANTOPRAZOLE SODIUM 40 MG PO TBEC
40.0000 mg | DELAYED_RELEASE_TABLET | Freq: Every day | ORAL | Status: DC
  Administered 2018-09-14: 40 mg via ORAL

## 2018-09-13 MED ORDER — ROPIVACAINE HCL 7.5 MG/ML IJ SOLN
INTRAMUSCULAR | Status: DC | PRN
  Administered 2018-09-13: 20 mL via PERINEURAL

## 2018-09-13 MED ORDER — PROPOFOL 10 MG/ML IV BOLUS
INTRAVENOUS | Status: AC
  Filled 2018-09-13: qty 80

## 2018-09-13 MED ORDER — ALUM & MAG HYDROXIDE-SIMETH 200-200-20 MG/5ML PO SUSP: 30.0000 mL | ORAL | Status: DC | PRN

## 2018-09-13 MED ORDER — ASPIRIN EC 325 MG PO TBEC
325.0000 mg | DELAYED_RELEASE_TABLET | Freq: Two times a day (BID) | ORAL | Status: DC
  Administered 2018-09-14: 325 mg via ORAL

## 2018-09-13 MED ORDER — LIP MEDEX EX OINT
TOPICAL_OINTMENT | CUTANEOUS | Status: AC
  Filled 2018-09-13: qty 7

## 2018-09-13 MED ORDER — SODIUM CHLORIDE 0.9 % IV SOLN
INTRAVENOUS | Status: DC
  Administered 2018-09-13 – 2018-09-14 (×2): via INTRAVENOUS

## 2018-09-13 MED ORDER — SODIUM CHLORIDE 0.9 % IJ SOLN
INTRAMUSCULAR | Status: AC
  Filled 2018-09-13: qty 50

## 2018-09-13 MED ORDER — ZOLPIDEM TARTRATE 5 MG PO TABS
5.0000 mg | ORAL_TABLET | Freq: Every evening | ORAL | Status: DC | PRN
  Administered 2018-09-13: 5 mg via ORAL
  Filled 2018-09-13: qty 1

## 2018-09-13 MED ORDER — BUPIVACAINE-EPINEPHRINE 0.5% -1:200000 IJ SOLN
INTRAMUSCULAR | Status: DC | PRN
  Administered 2018-09-13: 20 mL

## 2018-09-13 MED ORDER — EPHEDRINE SULFATE-NACL 50-0.9 MG/10ML-% IV SOSY
PREFILLED_SYRINGE | INTRAVENOUS | Status: DC | PRN
  Administered 2018-09-13 (×4): 5 mg via INTRAVENOUS

## 2018-09-13 MED ORDER — METHOCARBAMOL 500 MG IVPB - SIMPLE MED
500.0000 mg | Freq: Four times a day (QID) | INTRAVENOUS | Status: DC | PRN
  Filled 2018-09-13: qty 50

## 2018-09-13 MED ORDER — SODIUM CHLORIDE 0.9 % IR SOLN
Status: DC | PRN
  Administered 2018-09-13: 2000 mL

## 2018-09-13 MED ORDER — OXYCODONE HCL 5 MG PO TABS: 5.0000 mg | ORAL_TABLET | ORAL | Status: DC | PRN

## 2018-09-13 SURGICAL SUPPLY — 58 items
ARTISURF 10M PLYL 10-12GH KNEE (Knees) ×3 IMPLANT
BAG ZIPLOCK 12X15 (MISCELLANEOUS) ×3 IMPLANT
BANDAGE ACE 6X5 VEL STRL LF (GAUZE/BANDAGES/DRESSINGS) ×3 IMPLANT
BANDAGE ELASTIC 4 VELCRO ST LF (GAUZE/BANDAGES/DRESSINGS) ×3 IMPLANT
BANDAGE ELASTIC 6 VELCRO ST LF (GAUZE/BANDAGES/DRESSINGS) ×3 IMPLANT
BLADE SAGITTAL 13X1.27X60 (BLADE) ×2 IMPLANT
BLADE SAGITTAL 13X1.27X60MM (BLADE) ×1
BLADE SAW SGTL 83.5X18.5 (BLADE) ×3 IMPLANT
BLADE SURG 15 STRL LF DISP TIS (BLADE) ×1 IMPLANT
BLADE SURG 15 STRL SS (BLADE) ×2
BOWL SMART MIX CTS (DISPOSABLE) ×3 IMPLANT
CEMENT BONE SIMPLEX SPEEDSET (Cement) ×3 IMPLANT
CLOSURE WOUND 1/2 X4 (GAUZE/BANDAGES/DRESSINGS) ×2
COVER SURGICAL LIGHT HANDLE (MISCELLANEOUS) ×3 IMPLANT
COVER WAND RF STERILE (DRAPES) IMPLANT
CUFF TOURN SGL QUICK 34 (TOURNIQUET CUFF) ×2
CUFF TRNQT CYL 34X4X40X1 (TOURNIQUET CUFF) ×1 IMPLANT
DECANTER SPIKE VIAL GLASS SM (MISCELLANEOUS) ×6 IMPLANT
DRAPE INCISE IOBAN 66X45 STRL (DRAPES) ×6 IMPLANT
DRAPE U-SHAPE 47X51 STRL (DRAPES) ×3 IMPLANT
DRSG AQUACEL AG ADV 3.5X10 (GAUZE/BANDAGES/DRESSINGS) ×3 IMPLANT
DURAPREP 26ML APPLICATOR (WOUND CARE) ×6 IMPLANT
ELECT REM PT RETURN 15FT ADLT (MISCELLANEOUS) ×3 IMPLANT
FEMUR  CMT CCR STD SZ11 L KNEE (Knees) ×2 IMPLANT
FEMUR CMT CCR STD SZ11 L KNEE (Knees) ×1 IMPLANT
FEMUR CMTD CCR STD SZ11 L KNEE (Knees) ×1 IMPLANT
GLOVE BIOGEL M 7.0 STRL (GLOVE) ×3 IMPLANT
GLOVE BIOGEL M STRL SZ7.5 (GLOVE) ×3 IMPLANT
GLOVE BIOGEL PI IND STRL 7.5 (GLOVE) ×1 IMPLANT
GLOVE BIOGEL PI IND STRL 8.5 (GLOVE) ×1 IMPLANT
GLOVE BIOGEL PI INDICATOR 7.5 (GLOVE) ×2
GLOVE BIOGEL PI INDICATOR 8.5 (GLOVE) ×2
GLOVE SURG ORTHO 8.0 STRL STRW (GLOVE) ×6 IMPLANT
GOWN STRL REUS W/ TWL XL LVL3 (GOWN DISPOSABLE) ×2 IMPLANT
GOWN STRL REUS W/TWL 2XL LVL3 (GOWN DISPOSABLE) IMPLANT
GOWN STRL REUS W/TWL XL LVL3 (GOWN DISPOSABLE) ×4
HANDPIECE INTERPULSE COAX TIP (DISPOSABLE) ×2
HOOD PEEL AWAY FLYTE STAYCOOL (MISCELLANEOUS) ×9 IMPLANT
MANIFOLD NEPTUNE II (INSTRUMENTS) ×3 IMPLANT
NS IRRIG 1000ML POUR BTL (IV SOLUTION) ×3 IMPLANT
PACK TOTAL KNEE CUSTOM (KITS) ×3 IMPLANT
POSITIONER SURGICAL ARM (MISCELLANEOUS) ×3 IMPLANT
SET HNDPC FAN SPRY TIP SCT (DISPOSABLE) ×1 IMPLANT
STEM POLY PAT PLY 38M KNEE (Knees) ×3 IMPLANT
STEM TIBIA 5 DEG SZ G L KNEE (Knees) ×1 IMPLANT
STRIP CLOSURE SKIN 1/2X4 (GAUZE/BANDAGES/DRESSINGS) ×4 IMPLANT
SUT BONE WAX W31G (SUTURE) ×3 IMPLANT
SUT MNCRL AB 3-0 PS2 18 (SUTURE) ×3 IMPLANT
SUT STRATAFIX 0 PDS 27 VIOLET (SUTURE) ×3
SUT STRATAFIX PDS+ 0 24IN (SUTURE) ×3 IMPLANT
SUT VIC AB 1 CT1 36 (SUTURE) ×3 IMPLANT
SUTURE STRATFX 0 PDS 27 VIOLET (SUTURE) ×1 IMPLANT
SYR CONTROL 10ML LL (SYRINGE) ×6 IMPLANT
TIBIA STEM 5 DEG SZ G L KNEE (Knees) ×3 IMPLANT
TRAY FOLEY MTR SLVR 16FR STAT (SET/KITS/TRAYS/PACK) ×3 IMPLANT
WATER STERILE IRR 1000ML POUR (IV SOLUTION) ×6 IMPLANT
WRAP KNEE MAXI GEL POST OP (GAUZE/BANDAGES/DRESSINGS) ×3 IMPLANT
YANKAUER SUCT BULB TIP 10FT TU (MISCELLANEOUS) ×3 IMPLANT

## 2018-09-13 NOTE — Transfer of Care (Signed)
Immediate Anesthesia Transfer of Care Note  Patient: Johnathan Barker  Procedure(s) Performed: Procedure(s) with comments: LEFT TOTAL KNEE ARTHROPLASTY (Left) - with block  Patient Location: PACU  Anesthesia Type:Spinal  Level of Consciousness:  sedated, patient cooperative and responds to stimulation  Airway & Oxygen Therapy:Patient Spontanous Breathing and Patient connected to face mask oxgen  Post-op Assessment:  Report given to PACU RN and Post -op Vital signs reviewed and stable  Post vital signs:  Reviewed and stable  Last Vitals:  Vitals:   09/13/18 0742 09/13/18 0744  BP:    Pulse: 60 (!) 59  Resp: (!) 8 (!) 9  Temp:    SpO2: 606% 301%    Complications: No apparent anesthesia complications

## 2018-09-13 NOTE — Progress Notes (Signed)
AssistedDr. Turk with left, ultrasound guided, adductor canal block. Side rails up, monitors on throughout procedure. See vital signs in flow sheet. Tolerated Procedure well.  

## 2018-09-13 NOTE — H&P (Signed)
Johnathan Barker MRN:  [id number] DOB/SEX:  September 08, 1941/male  CHIEF COMPLAINT:  Painful left Knee  HISTORY: Patient is a 77 y.o. male presented with a history of pain in the left knee. Onset of symptoms was gradual starting a few years ago with gradually worsening course since that time. Patient has been treated conservatively with over-the-counter NSAIDs and activity modification. Patient currently rates pain in the knee at 10 out of 10 with activity. There is pain at night.  PAST MEDICAL HISTORY: Patient Active Problem List   Diagnosis Date Noted  . 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 Arthritis    knees  . Diabetes mellitus without complication ([place])    type II  . Dysrhythmia   . History of kidney stones    lithotripsy  . Hypertension    Past Surgical History:  Procedure Laterality Date  . EYE SURGERY Bilateral    cataracts removed= IOL  . TONSILLECTOMY    . TOTAL KNEE ARTHROPLASTY Right 11/30/2017   Procedure: RIGHT TOTAL KNEE ARTHROPLASTY;  Surgeon: Vickey Huger, MD;  Location: [place];  Service: Orthopedics;  Laterality: Right;     MEDICATIONS:   Medications Prior to Admission  Medication Sig Dispense Refill Last Dose  . acetaminophen (TYLENOL) 500 MG tablet Take 500 mg by mouth as needed.   09/12/2018 at 2200  . amLODipine (NORVASC) 5 MG tablet Take 5 mg by mouth at bedtime.    09/12/2018 at Unknown time  . atorvastatin (LIPITOR) 10 MG tablet Take 10 mg by mouth daily.   09/13/2018 at Unknown time  . glimepiride (AMARYL) 4 MG tablet Take 4 mg by mouth 2 (two) times daily. Morning & evening   09/12/2018 at Unknown time  . hydrochlorothiazide (HYDRODIURIL) 25 MG tablet Take 25 mg by mouth daily.  2 09/12/2018 at Unknown time  . ibuprofen (ADVIL,MOTRIN) 800 MG tablet Take 800 mg by mouth 3 (three) times daily as needed for pain.  2 Past Week at Unknown time  . metFORMIN (GLUCOPHAGE) 1000 MG tablet Take 1,000 mg by mouth 2 (two)  times daily with a meal. Morning & afternoon   09/12/2018 at Unknown time  . metoprolol succinate (TOPROL-XL) 50 MG 24 hr tablet Take 50 mg by mouth daily before breakfast. Take with or immediately following a meal.    09/13/2018 at Unknown time  . valsartan (DIOVAN) 160 MG tablet Take 320 mg by mouth daily.   99 09/12/2018 at Unknown time  . aspirin EC 325 MG tablet Take 325-650 mg by mouth every 8 (eight) hours as needed (for pain.).   08/25/2018  . dapagliflozin propanediol (FARXIGA) 10 MG TABS tablet Take 10 mg by mouth daily.   08/29/2018 at Unknown time  . Omega-3 Fatty Acids (FISH OIL PO) Take by mouth.   08/29/2018 at Unknown time    ALLERGIES:   Allergies  Allergen Reactions  . Penicillins Hives, Rash and Other (See Comments)    FEVER  PATIENT HAS HAD A PCN REACTION WITH IMMEDIATE RASH, FACIAL/TONGUE/THROAT SWELLING, SOB, OR LIGHTHEADEDNESS WITH HYPOTENSION:  #  #  #  YES  #  #  #   Has patient had a PCN reaction causing severe rash involving mucus membranes or skin necrosis: No Has patient had a PCN reaction that required hospitalization: No Has patient had a PCN reaction occurring within the last 10 years: No If all of the above answers are "NO", then may proceed with Cephalosporin use.  REVIEW OF SYSTEMS:  A comprehensive review of systems was negative except for: Musculoskeletal: positive for arthralgias and bone pain   FAMILY HISTORY:  History reviewed. No pertinent family history.  SOCIAL HISTORY:   Social History   Tobacco Use  . Smoking status: Never Smoker  . Smokeless tobacco: Never Used  Substance Use Topics  . Alcohol use: No    Frequency: Never     EXAMINATION:  Vital signs in last 24 hours: Temp:  [98.3 F (36.8 C)] 98.3 F (36.8 C) (10/21 0620) Pulse Rate:  [63] 63 (10/21 0620) Resp:  [18] 18 (10/21 0620) BP: (135)/(72) 135/72 (10/21 0620) SpO2:  [100 %] 100 % (10/21 0620) Weight:  [93.4 kg] 93.4 kg (10/21 0620)  BP 135/72   Pulse 63   Temp  98.3 F (36.8 C) (Oral)   Resp 18   Ht 6' (1.829 m)   Wt 93.4 kg   SpO2 100%   BMI 27.94 kg/m   General Appearance:    Alert, cooperative, no distress, appears stated age  Head:    Normocephalic, without obvious abnormality, atraumatic  Eyes:    PERRL, conjunctiva/corneas clear, EOM's intact, fundi    benign, both eyes       Ears:    Normal TM's and external ear canals, both ears  Nose:   Nares normal, septum midline, mucosa normal, no drainage    or sinus tenderness  Throat:   Lips, mucosa, and tongue normal; teeth and gums normal  Neck:   Supple, symmetrical, trachea midline, no adenopathy;       thyroid:  No enlargement/tenderness/nodules; no carotid   bruit or JVD  Back:     Symmetric, no curvature, ROM normal, no CVA tenderness  Lungs:     Clear to auscultation bilaterally, respirations unlabored  Chest wall:    No tenderness or deformity  Heart:    Regular rate and rhythm, S1 and S2 normal, no murmur, rub   or gallop  Abdomen:     Soft, non-tender, bowel sounds active all four quadrants,    no masses, no organomegaly  Genitalia:    Normal male without lesion, discharge or tenderness  Rectal:    Normal tone, normal prostate, no masses or tenderness;   guaiac negative stool  Extremities:   Extremities normal, atraumatic, no cyanosis or edema  Pulses:   2+ and symmetric all extremities  Skin:   Skin color, texture, turgor normal, no rashes or lesions  Lymph nodes:   Cervical, supraclavicular, and axillary nodes normal  Neurologic:   CNII-XII intact. Normal strength, sensation and reflexes      throughout    Musculoskeletal:  ROM 0-120, Ligaments intact,  Imaging Review Plain radiographs demonstrate severe degenerative joint disease of the left knee. The overall alignment is neutral. The bone quality appears to be good for age and reported activity level.  Assessment/Plan: Primary osteoarthritis, left knee   The patient history, physical examination and imaging studies  are consistent with advanced degenerative joint disease of the left knee. The patient has failed conservative treatment.  The clearance notes were reviewed.  After discussion with the patient it was felt that Total Knee Replacement was indicated. The procedure,  risks, and benefits of total knee arthroplasty were presented and reviewed. The risks including but not limited to aseptic loosening, infection, blood clots, vascular injury, stiffness, patella tracking problems complications among others were discussed. The patient acknowledged the explanation, agreed to proceed with the plan.  Preoperative templating of the  joint replacement has been completed, documented, and submitted to the Operating Room personnel in order to optimize intra-operative equipment management.    Patient's anticipated LOS is less than 2 midnights, meeting these requirements: - Lives within 1 hour of care - Has a competent adult at home to recover with post-op recover - NO history of  - Chronic pain requiring opiods  - Diabetes  - Coronary Artery Disease  - Heart failure  - Heart attack  - Stroke  - DVT/VTE  - Cardiac arrhythmia  - Respiratory Failure/COPD  - Renal failure  - Anemia  - Advanced Liver disease       Donia Ast 09/13/2018, 6:23 AM

## 2018-09-13 NOTE — Evaluation (Signed)
Physical Therapy Evaluation Patient Details Name: Johnathan Barker MRN: [id number] DOB: [birth year] Today's Date: 09/13/2018   History of Present Illness  77 YO male s/p L TKR on 09/13/18. PMH includes arthritis, DMII, dysrhythmia, HTN, cataracts, R TKR 11/2017.   Clinical Impression  Pt presents with L knee pain, increased time and effort to perform bed mobility and gait, difficulty transferring from sit to stand, and decreased tolerance for ambulation. Pt to benefit from acute PT to address deficits. Pt ambulated 60 ft with RW with min guard assist. Pt able to demonstrate and agreed to perform the following: ankle pumps (20x/hour), heel slides (10x today), and quad sets (10x today).  PT to progress mobility as tolerated, and will continue to follow acutely.      Follow Up Recommendations Follow surgeon's recommendation for DC plan and follow-up therapies(HHPT )    Equipment Recommendations  None recommended by PT    Recommendations for Other Services       Precautions / Restrictions Precautions Precautions: Fall Restrictions Weight Bearing Restrictions: No Other Position/Activity Restrictions: WBAT       Mobility  Bed Mobility Overal bed mobility: Needs Assistance Bed Mobility: Supine to Sit     Supine to sit: Min guard;HOB elevated     General bed mobility comments: min guard for safety. Increased time/effort   Transfers Overall transfer level: Needs assistance Equipment used: Rolling walker (2 wheeled) Transfers: Sit to/from Stand Sit to Stand: Min assist;From elevated surface         General transfer comment: Assist to rise and steady, pt encouraged to slow down because pt trying to take steps immediately after standing. Verbal cuing for hand placement.   Ambulation/Gait Ambulation/Gait assistance: Min guard;+2 safety/equipment(chair follow ) Gait Distance (Feet): 60 Feet Assistive device: Rolling walker (2 wheeled) Gait Pattern/deviations: Step-to  pattern;Step-through pattern;Decreased stride length;Trunk flexed;Antalgic;Decreased weight shift to left;Decreased stance time - left Gait velocity: slightly decr    General Gait Details: Min guard for safety. Verbal cuing on sequencing, placement in RW, turning.   Stairs            Wheelchair Mobility    Modified Rankin (Stroke Patients Only)       Balance Overall balance assessment: Mild deficits observed, not formally tested                                           Pertinent Vitals/Pain Pain Assessment: 0-10 Pain Score: 5  Pain Location: L knee  Pain Descriptors / Indicators: Sore Pain Intervention(s): Limited activity within patient's tolerance;Monitored during session;Ice applied    Home Living Family/patient expects to be discharged to:: Private residence Living Arrangements: Spouse/significant other Available Help at Discharge: Family;Available 24 hours/day Type of Home: House Home Access: Stairs to enter Entrance Stairs-Rails: Left Entrance Stairs-Number of Steps: 3  Home Layout: One level Home Equipment: Walker - 2 wheels;Bedside commode;Shower seat - built in;Cane - single point      Prior Function Level of Independence: Independent               Hand Dominance   Dominant Hand: Right    Extremity/Trunk Assessment   Upper Extremity Assessment Upper Extremity Assessment: Overall WFL for tasks assessed    Lower Extremity Assessment Lower Extremity Assessment: Overall WFL for tasks assessed;LLE deficits/detail LLE Deficits / Details: suspected post-surgical weakness; able to perform SLR with assist and quad lag, quad  set, ankle pumps  LLE Sensation: WNL    Cervical / Trunk Assessment Cervical / Trunk Assessment: Normal  Communication   Communication: No difficulties  Cognition Arousal/Alertness: Awake/alert Behavior During Therapy: WFL for tasks assessed/performed Overall Cognitive Status: Within Functional Limits for  tasks assessed                                        General Comments      Exercises Total Joint Exercises Ankle Circles/Pumps: AROM;Both;5 reps;Supine Goniometric ROM: knee AAROM approximately 10-60*, limited by stiffness/pain    Assessment/Plan    PT Assessment Patient needs continued PT services  PT Problem List Decreased strength;Pain;Decreased range of motion;Decreased activity tolerance;Decreased knowledge of use of DME;Decreased balance;Decreased mobility;Decreased safety awareness       PT Treatment Interventions DME instruction;Therapeutic activities;Gait training;Therapeutic exercise;Patient/family education;Stair training;Balance training;Functional mobility training    PT Goals (Current goals can be found in the Care Plan section)  Acute Rehab PT Goals Patient Stated Goal: none stated  PT Goal Formulation: With patient Time For Goal Achievement: 09/27/18 Potential to Achieve Goals: Good    Frequency 7X/week   Barriers to discharge        Co-evaluation               AM-PAC PT "6 Clicks" Daily Activity  Outcome Measure Difficulty turning over in bed (including adjusting bedclothes, sheets and blankets)?: Unable Difficulty moving from lying on back to sitting on the side of the bed? : Unable Difficulty sitting down on and standing up from a chair with arms (e.g., wheelchair, bedside commode, etc,.)?: Unable Help needed moving to and from a bed to chair (including a wheelchair)?: A Little Help needed walking in hospital room?: A Little Help needed climbing 3-5 steps with a railing? : A Little 6 Click Score: 12    End of Session Equipment Utilized During Treatment: Gait belt Activity Tolerance: Patient tolerated treatment well Patient left: in chair;with chair alarm set;with call bell/phone within reach;with SCD's reapplied Nurse Communication: Mobility status PT Visit Diagnosis: Other abnormalities of gait and mobility  (R26.89);Difficulty in walking, not elsewhere classified (R26.2)    Time: 3532-9924 PT Time Calculation (min) (ACUTE ONLY): 24 min   Charges:   PT Evaluation $PT Eval Low Complexity: 1 Low PT Treatments $Gait Training: 8-22 mins       Julien Girt, PT Acute Rehabilitation Services Pager [phone]  Office [phone]   [place]arl[place] Maya D Elonda Husky 09/13/2018, 4:15 PM

## 2018-09-13 NOTE — Anesthesia Procedure Notes (Signed)
Anesthesia Regional Block: Adductor canal block   Pre-Anesthetic Checklist: ,, timeout performed, Correct Patient, Correct Site, Correct Laterality, Correct Procedure, Correct Position, site marked, Risks and benefits discussed,  Surgical consent,  Pre-op evaluation,  At surgeon's request and post-op pain management  Laterality: Left  Prep: chloraprep       Needles:  Injection technique: Single-shot  Needle Type: Echogenic Needle     Needle Length: 9cm  Needle Gauge: 21     Additional Needles:   Procedures:,,,, ultrasound used (permanent image in chart),,,,  Narrative:  Start time: 09/13/2018 7:22 AM End time: 09/13/2018 7:27 AM Injection made incrementally with aspirations every 5 mL.  Performed by: Personally  Anesthesiologist: Catalina Gravel, MD  Additional Notes: No pain on injection. No increased resistance to injection. Injection made in 5cc increments.  Good needle visualization.  Patient tolerated procedure well.

## 2018-09-13 NOTE — Anesthesia Procedure Notes (Signed)
Spinal  Patient location during procedure: OR Start time: 09/13/2018 8:36 AM End time: 09/13/2018 8:39 AM Staffing Anesthesiologist: Catalina Gravel, MD Performed: anesthesiologist  Preanesthetic Checklist Completed: patient identified, surgical consent, pre-op evaluation, timeout performed, IV checked, risks and benefits discussed and monitors and equipment checked Spinal Block Patient position: sitting Prep: site prepped and draped and DuraPrep Patient monitoring: continuous pulse ox and blood pressure Approach: midline Location: L3-4 Injection technique: single-shot Needle Needle type: Pencan  Needle gauge: 24 G Additional Notes Functioning IV was confirmed and monitors were applied. Sterile prep and drape, including hand hygiene, mask and sterile gloves were used. The patient was positioned and the spine was prepped. The skin was anesthetized with lidocaine.  Free flow of clear CSF was obtained prior to injecting local anesthetic into the CSF.  The spinal needle aspirated freely following injection.  The needle was carefully withdrawn.  The patient tolerated the procedure well. Consent was obtained prior to procedure with all questions answered and concerns addressed. Risks including but not limited to bleeding, infection, nerve damage, paralysis, failed block, inadequate analgesia, allergic reaction, high spinal, itching and headache were discussed and the patient wished to proceed.   Attempt x1 by CRNA, attempt x1 by MDA.  Hoy Morn, MD

## 2018-09-13 NOTE — Anesthesia Preprocedure Evaluation (Addendum)
Anesthesia Evaluation  Patient identified by MRN, date of birth, ID band Patient awake    Reviewed: Allergy & Precautions, NPO status , Patient's Chart, lab work & pertinent test results, reviewed documented beta blocker date and time   Airway Mallampati: II  TM Distance: >3 FB Neck ROM: Full    Dental  (+) Teeth Intact, Dental Advisory Given, Chipped,    Pulmonary neg pulmonary ROS,    Pulmonary exam normal breath sounds clear to auscultation       Cardiovascular hypertension, Pt. on medications and Pt. on home beta blockers (-) angina(-) Past MI Normal cardiovascular exam Rhythm:Regular Rate:Normal     Neuro/Psych Seizures -,     GI/Hepatic negative GI ROS, Neg liver ROS,   Endo/Other  diabetes, Type 2, Oral Hypoglycemic Agents  Renal/GU negative Renal ROS     Musculoskeletal  (+) Arthritis , Osteoarthritis,    Abdominal   Peds  Hematology  (+) Blood dyscrasia, anemia , Plt 303k   Anesthesia Other Findings Day of surgery medications reviewed with the patient.  Reproductive/Obstetrics                           Anesthesia Physical Anesthesia Plan  ASA: II  Anesthesia Plan: Spinal   Post-op Pain Management:  Regional for Post-op pain   Induction:   PONV Risk Score and Plan: 1 and Propofol infusion and Treatment may vary due to age or medical condition  Airway Management Planned: Natural Airway and Simple Face Mask  Additional Equipment:   Intra-op Plan:   Post-operative Plan:   Informed Consent: I have reviewed the patients History and Physical, chart, labs and discussed the procedure including the risks, benefits and alternatives for the proposed anesthesia with the patient or authorized representative who has indicated his/her understanding and acceptance.   Dental advisory given  Plan Discussed with: CRNA, Anesthesiologist and Surgeon  Anesthesia Plan Comments:          Anesthesia Quick Evaluation

## 2018-09-14 ENCOUNTER — Encounter (HOSPITAL_COMMUNITY): Payer: Self-pay | Admitting: Orthopedic Surgery

## 2018-09-14 DIAGNOSIS — E119 Type 2 diabetes mellitus without complications: Secondary | ICD-10-CM | POA: Diagnosis not present

## 2018-09-14 DIAGNOSIS — M1712 Unilateral primary osteoarthritis, left knee: Secondary | ICD-10-CM | POA: Diagnosis not present

## 2018-09-14 DIAGNOSIS — Z87442 Personal history of urinary calculi: Secondary | ICD-10-CM | POA: Diagnosis not present

## 2018-09-14 DIAGNOSIS — Z7984 Long term (current) use of oral hypoglycemic drugs: Secondary | ICD-10-CM | POA: Diagnosis not present

## 2018-09-14 DIAGNOSIS — Z96651 Presence of right artificial knee joint: Secondary | ICD-10-CM | POA: Diagnosis not present

## 2018-09-14 DIAGNOSIS — I10 Essential (primary) hypertension: Secondary | ICD-10-CM | POA: Diagnosis not present

## 2018-09-14 LAB — CBC
HEMATOCRIT: 36 % — AB (ref 39.0–52.0)
HEMOGLOBIN: 12 g/dL — AB (ref 13.0–17.0)
MCH: 30.2 pg (ref 26.0–34.0)
MCHC: 33.3 g/dL (ref 30.0–36.0)
MCV: 90.7 fL (ref 80.0–100.0)
NRBC: 0 % (ref 0.0–0.2)
Platelets: 356 10*3/uL (ref 150–400)
RBC: 3.97 MIL/uL — ABNORMAL LOW (ref 4.22–5.81)
RDW: 13.1 % (ref 11.5–15.5)
WBC: 16.9 10*3/uL — AB (ref 4.0–10.5)

## 2018-09-14 LAB — BASIC METABOLIC PANEL
Anion gap: 11 (ref 5–15)
BUN: 32 mg/dL — ABNORMAL HIGH (ref 8–23)
CALCIUM: 8.7 mg/dL — AB (ref 8.9–10.3)
CO2: 24 mmol/L (ref 22–32)
CREATININE: 1.09 mg/dL (ref 0.61–1.24)
Chloride: 104 mmol/L (ref 98–111)
GFR calc non Af Amer: >60 mL/min (ref >60)
Glucose, Bld: 154 mg/dL — ABNORMAL HIGH (ref 70–99)
Potassium: 4.1 mmol/L (ref 3.5–5.1)
SODIUM: 139 mmol/L (ref 135–145)

## 2018-09-14 MED ORDER — METHOCARBAMOL 500 MG PO TABS: 500.0000 mg | ORAL_TABLET | Freq: Four times a day (QID) | ORAL | 0 refills | Status: DC | PRN

## 2018-09-14 MED ORDER — OXYCODONE HCL 5 MG PO TABS: 5.0000 mg | ORAL_TABLET | Freq: Four times a day (QID) | ORAL | 0 refills | Status: DC | PRN

## 2018-09-14 MED ORDER — ASPIRIN 325 MG PO TBEC: 325.0000 mg | DELAYED_RELEASE_TABLET | Freq: Two times a day (BID) | ORAL | 0 refills | Status: DC

## 2018-09-14 NOTE — Progress Notes (Signed)
Physical Therapy Treatment Patient Details Name: Johnathan Barker MRN: [id number] DOB: 1941-07-29 Today's Date: 09/14/2018    History of Present Illness 77 YO male s/p L TKR on 09/13/18. PMH includes arthritis, DMII, dysrhythmia, HTN, cataracts, R TKR 11/2017.     PT Comments    Pt is progressing quite well with mobility and is ready to DC home from PT standpoint. He ambulated 200' with RW, no loss of balance, stair training completed, reviewed HEP.   Follow Up Recommendations  Follow surgeon's recommendation for DC plan and follow-up therapies(HHPT )     Equipment Recommendations  None recommended by PT    Recommendations for Other Services       Precautions / Restrictions Precautions Precautions: Fall Restrictions Weight Bearing Restrictions: No Other Position/Activity Restrictions: WBAT     Mobility  Bed Mobility               General bed mobility comments: up in recliner  Transfers Overall transfer level: Needs assistance Equipment used: Rolling walker (2 wheeled) Transfers: Sit to/from Stand Sit to Stand: Supervision         General transfer comment: VCs for hand placement  Ambulation/Gait Ambulation/Gait assistance: Supervision(chair follow ) Gait Distance (Feet): 200 Feet Assistive device: Rolling walker (2 wheeled) Gait Pattern/deviations: Step-through pattern;Decreased stride length;Decreased weight shift to left Gait velocity: slightly decr    General Gait Details: good sequencing, no loss of balance   Stairs Stairs: Yes Stairs assistance: Min guard Stair Management: One rail Left;Step to pattern;Forwards;With cane Number of Stairs: 5 General stair comments: VCs for sequencing with cane, pt's wife and 2 sons present   Wheelchair Mobility    Modified Rankin (Stroke Patients Only)       Balance Overall balance assessment: Modified Independent                                          Cognition  Arousal/Alertness: Awake/alert Behavior During Therapy: WFL for tasks assessed/performed Overall Cognitive Status: Within Functional Limits for tasks assessed                                        Exercises Total Joint Exercises Ankle Circles/Pumps: AROM;Both;Supine;10 reps Quad Sets: AROM;Both;10 reps;Supine Short Arc Quad: AROM;Left;15 reps;Supine Heel Slides: AAROM;Left;10 reps;Supine Straight Leg Raises: AROM;Left;10 reps;Supine Long Arc Quad: AROM;Left;10 reps;Seated Knee Flexion: AAROM;AROM;10 reps;Left;Seated Goniometric ROM: 10-65* AAROM L knee    General Comments        Pertinent Vitals/Pain Pain Score: 3  Pain Location: L knee  Pain Descriptors / Indicators: Sore Pain Intervention(s): Limited activity within patient's tolerance;Monitored during session;Premedicated before session;Ice applied    Home Living                      Prior Function            PT Goals (current goals can now be found in the care plan section) Acute Rehab PT Goals Patient Stated Goal: go to grandson's baseball games PT Goal Formulation: With patient Time For Goal Achievement: 09/27/18 Potential to Achieve Goals: Good Progress towards PT goals: Progressing toward goals    Frequency    7X/week      PT Plan Current plan remains appropriate    Co-evaluation  AM-PAC PT "6 Clicks" Daily Activity  Outcome Measure  Difficulty turning over in bed (including adjusting bedclothes, sheets and blankets)?: A Little Difficulty moving from lying on back to sitting on the side of the bed? : A Little Difficulty sitting down on and standing up from a chair with arms (e.g., wheelchair, bedside commode, etc,.)?: A Little Help needed moving to and from a bed to chair (including a wheelchair)?: A Little Help needed walking in hospital room?: A Little Help needed climbing 3-5 steps with a railing? : A Little 6 Click Score: 18    End of Session  Equipment Utilized During Treatment: Gait belt Activity Tolerance: Patient tolerated treatment well Patient left: in chair;with call bell/phone within reach;with family/visitor present Nurse Communication: Mobility status PT Visit Diagnosis: Other abnormalities of gait and mobility (R26.89);Difficulty in walking, not elsewhere classified (R26.2)     Time: 0349-1791 PT Time Calculation (min) (ACUTE ONLY): 31 min  Charges:  $Gait Training: 8-22 mins $Therapeutic Exercise: 8-22 mins                     Blondell Reveal Kistler PT 09/14/2018  Acute Rehabilitation Services Pager [phone] Office [phone]

## 2018-09-14 NOTE — Op Note (Signed)
TOTAL KNEE REPLACEMENT OPERATIVE NOTE:  09/13/2018  2:46 PM  PATIENT:  Johnathan Barker  77 y.o. male  PRE-OPERATIVE DIAGNOSIS:  LT. Knee Osteoarthritis  POST-OPERATIVE DIAGNOSIS:  LT. Knee Osteoarthritis  PROCEDURE:  Procedure(s): LEFT TOTAL KNEE ARTHROPLASTY  SURGEON:  Surgeon(s): Vickey Huger, MD  PHYSICIAN ASSISTANT:Blair Mancel Bale, PA-C  ANESTHESIA:   spinal  SPECIMEN: None  COUNTS:  Correct  TOURNIQUET:   Total Tourniquet Time Documented: Thigh (Left) - 47 minutes Total: Thigh (Left) - 47 minutes   DICTATION:  Indication for procedure:    The patient is a 77 y.o. male who has failed conservative treatment for LT. Knee Osteoarthritis.  Informed consent was obtained prior to anesthesia. The risks versus benefits of the operation were explain and in a way the patient can, and did, understand.   On the implant demand matching protocol, this patient scored 10.  Therefore, this patient was not receive a polyethylene insert with vitamin E which is a high demand implant.  Description of procedure:     The patient was taken to the operating room and placed under anesthesia.  The patient was positioned in the usual fashion taking care that all body parts were adequately padded and/or protected.  A tourniquet was applied and the leg prepped and draped in the usual sterile fashion.  The extremity was exsanguinated with the esmarch and tourniquet inflated to 350 mmHg.  Pre-operative range of motion was normal.  The knee was in 5 degree of mild varus.  A midline incision approximately 6-7 inches long was made with a #10 blade.  A new blade was used to make a parapatellar arthrotomy going 2-3 cm into the quadriceps tendon, over the patella, and alongside the medial aspect of the patellar tendon.  A synovectomy was then performed with the #10 blade and forceps. I then elevated the deep MCL off the medial tibial metaphysis subperiosteally around to the semimembranosus attachment.     I everted the patella and used calipers to measure patellar thickness.  I used the reamer to ream down to appropriate thickness to recreate the native thickness.  I then removed excess bone with the rongeur and sagittal saw.  I used the appropriately sized template and drilled the three lug holes.  I then put the trial in place and measured the thickness with the calipers to ensure recreation of the native thickness.  The trial was then removed and the patella subluxed and the knee brought into flexion.  A homan retractor was place to retract and protect the patella and lateral structures.  A Z-retractor was place medially to protect the medial structures.  The extra-medullary alignment system was used to make cut the tibial articular surface perpendicular to the anamotic axis of the tibia and in 3 degrees of posterior slope.  The cut surface and alignment jig was removed.  I then used the intramedullary alignment guide to make a 6 valgus cut on the distal femur.  I then marked out the epicondylar axis on the distal femur.  The posterior condylar axis measured 3 degrees.  I then used the anterior referencing sizer and measured the femur to be a size 11.  The 4-In-1 cutting block was screwed into place in external rotation matching the posterior condylar angle, making our cuts perpendicular to the epicondylar axis.  Anterior, posterior and chamfer cuts were made with the sagittal saw.  The cutting block and cut pieces were removed.  A lamina spreader was placed in 90 degrees of flexion.  The ACL, PCL, menisci, and posterior condylar osteophytes were removed.  A 11 mm spacer blocked was found to offer good flexion and extension gap balance after mild in degree releasing.   The scoop retractor was then placed and the femoral finishing block was pinned in place.  The small sagittal saw was used as well as the lug drill to finish the femur.  The block and cut surfaces were removed and the medullary canal hole  filled with autograft bone from the cut pieces.  The tibia was delivered forward in deep flexion and external rotation.  A size G tray was selected and pinned into place centered on the medial 1/3 of the tibial tubercle.  The reamer and keel was used to prepare the tibia through the tray.    I then trialed with the size 11 femur, size G tibia, a 10 mm insert and the 38 patella.  I had excellent flexion/extension gap balance, excellent patella tracking.  Flexion was full and beyond 120 degrees; extension was zero.  These components were chosen and the staff opened them to me on the back table while the knee was lavaged copiously and the cement mixed.  The soft tissue was infiltrated with 60cc of exparel 1.3% through a 21 gauge needle.  I cemented in the components and removed all excess cement.  The polyethylene tibial component was snapped into place and the knee placed in extension while cement was hardening.  The capsule was infilltrated with a 60cc exparel/marcaine/saline mixture.   Once the cement was hard, the tourniquet was let down.  Hemostasis was obtained.  The arthrotomy was closed using a #1 stratofix running suture.  The deep soft tissues were closed with #0 vicryls and the subcuticular layer closed with #2-0 vicryl.  The skin was reapproximated and closed with 3.0 Monocryl.  The wound was covered with steristrips, aquacel dressing, and a TED stocking.   The patient was then awakened, extubated, and taken to the recovery room in stable condition.  BLOOD LOSS:  810FB COMPLICATIONS:  None.  PLAN OF CARE: Admit for overnight observation  PATIENT DISPOSITION:  PACU - hemodynamically stable.   Delay start of Pharmacological VTE agent (>24hrs) due to surgical blood loss or risk of bleeding:  not applicable  Please fax a copy of this op note to my office at [phone] (please only include page 1 and 2 of the Case Information op note)

## 2018-09-14 NOTE — Progress Notes (Signed)
SPORTS MEDICINE AND JOINT REPLACEMENT  Lara Mulch, MD    Carlyon Shadow, PA-C [address], [place], [place]  [address]                             [phone]   PROGRESS NOTE  Subjective:  negative for Chest Pain  negative for Shortness of Breath  negative for Nausea/Vomiting   negative for Calf Pain  negative for Bowel Movement   Tolerating Diet: yes         Patient reports pain as 3 on 0-10 scale.    Objective: Vital signs in last 24 hours:    Patient Vitals for the past 24 hrs:  BP Temp Temp src Pulse Resp SpO2  09/14/18 0541 (!) 114/56 98 F (36.7 C) Oral 65 - 96 %  09/14/18 0057 114/64 (!) 97.4 F (36.3 C) Oral 76 - 97 %  09/13/18 2227 121/64 98.3 F (36.8 C) Oral 71 - 97 %  09/13/18 1754 (!) 147/52 (!) 97.5 F (36.4 C) Oral 78 14 100 %  09/13/18 1519 140/71 98 F (36.7 C) - 82 14 100 %  09/13/18 1420 138/69 98 F (36.7 C) Oral 75 14 100 %  09/13/18 1304 136/71 97.6 F (36.4 C) Oral 69 14 100 %  09/13/18 1208 135/66 97.7 F (36.5 C) Oral 66 14 100 %  09/13/18 1145 - 98.2 F (36.8 C) - 63 18 100 %  09/13/18 1130 (!) 119/57 - - 64 17 100 %  09/13/18 1115 (!) 118/54 98.1 F (36.7 C) - (!) 59 19 98 %  09/13/18 1100 (!) 115/57 - - 64 (!) 23 100 %  09/13/18 1040 111/60 98.3 F (36.8 C) - 62 15 100 %  09/13/18 0744 - - - (!) 59 (!) 9 100 %  09/13/18 0742 - - - 60 (!) 8 100 %  09/13/18 0740 - - - (!) 59 13 100 %  09/13/18 0738 - - - (!) 59 17 100 %  09/13/18 0736 - - - (!) 58 14 100 %  09/13/18 0734 129/72 - - (!) 58 11 100 %  09/13/18 0733 - - - 60 (!) 9 100 %  09/13/18 0732 - - - 60 15 100 %  09/13/18 0731 - - - (!) 59 (!) 9 100 %  09/13/18 0730 - - - (!) 59 16 100 %  09/13/18 0729 137/67 - - 61 11 100 %  09/13/18 0728 - - - (!) 55 19 100 %  09/13/18 0727 - - - 62 11 100 %  09/13/18 0726 - - - 63 14 100 %  09/13/18 0725 - - - 64 15 100 %  09/13/18 0724 - - - 62 17 100 %    @flow {1959:LAST@   Intake/Output from previous day:   10/21 0701 -  10/22 0700 In: 3668.2 [P.O.:660; I.V.:2849.7] Out: 3700 [Urine:3650]   Intake/Output this shift:   No intake/output data recorded.   Intake/Output      10/21 0701 - 10/22 0700 10/22 0701 - 10/23 0700   P.O. 660    I.V. (mL/kg) 2849.7 (30.5)    IV Piggyback 158.5    Total Intake(mL/kg) 3668.2 (39.3)    Urine (mL/kg/hr) 3650 (1.6)    Stool 0    Blood 50    Total Output 3700    Net -31.8            LABORATORY DATA: Recent Labs  09/14/18 0434  WBC 16.9*  HGB 12.0*  HCT 36.0*  PLT 356   Recent Labs    09/14/18 0434  NA 139  K 4.1  CL 104  CO2 24  BUN 32*  CREATININE 1.09  GLUCOSE 154*  CALCIUM 8.7*   No results found for: INR, PROTIME  Examination:  General appearance: alert, cooperative and no distress Extremities: extremities normal, atraumatic, no cyanosis or edema  Wound Exam: clean, dry, intact   Drainage:  None: wound tissue dry  Motor Exam: Quadriceps and Hamstrings Intact  Sensory Exam: Superficial Peroneal, Deep Peroneal and Tibial normal   Assessment:    1 Day Post-Op  Procedure(s) (LRB): LEFT TOTAL KNEE ARTHROPLASTY (Left)  ADDITIONAL DIAGNOSIS:  Active Problems:   S/P total knee replacement     Plan: Physical Therapy as ordered Weight Bearing as Tolerated (WBAT)  DVT Prophylaxis:  Aspirin  DISCHARGE PLAN: Home  DISCHARGE NEEDS: HHPT       Patient's anticipated LOS is less than 2 midnights, meeting these requirements: - Lives within 1 hour of care - Has a competent adult at home to recover with post-op recover - NO history of  - Chronic pain requiring opiods  - Diabetes  - Coronary Artery Disease  - Heart failure  - Heart attack  - Stroke  - DVT/VTE  - Cardiac arrhythmia  - Respiratory Failure/COPD  - Renal failure  - Anemia  - Advanced Liver disease  Patient doing well, expected D/C home today      Donia Ast 09/14/2018, 7:07 AM

## 2018-09-14 NOTE — Anesthesia Postprocedure Evaluation (Signed)
Anesthesia Post Note  Patient: Johnathan Barker  Procedure(s) Performed: LEFT TOTAL KNEE ARTHROPLASTY (Left Knee)     Patient location during evaluation: PACU Anesthesia Type: Spinal Level of consciousness: oriented and awake and alert Pain management: pain level controlled Vital Signs Assessment: post-procedure vital signs reviewed and stable Respiratory status: spontaneous breathing, respiratory function stable and nonlabored ventilation Cardiovascular status: blood pressure returned to baseline and stable Postop Assessment: no headache, no backache, no apparent nausea or vomiting, patient able to bend at knees and spinal receding Anesthetic complications: no    Last Vitals:  Vitals:   09/14/18 0830 09/14/18 1000  BP: 130/67 133/72  Pulse: 62 67  Resp:  16  Temp:  (!) 36.4 C  SpO2: 100% 99%    Last Pain:  Vitals:   09/14/18 1000  TempSrc: Oral  PainSc:                  Catalina Gravel

## 2018-09-15 DIAGNOSIS — E119 Type 2 diabetes mellitus without complications: Secondary | ICD-10-CM | POA: Diagnosis not present

## 2018-09-15 DIAGNOSIS — Z7984 Long term (current) use of oral hypoglycemic drugs: Secondary | ICD-10-CM | POA: Diagnosis not present

## 2018-09-15 DIAGNOSIS — Z9181 History of falling: Secondary | ICD-10-CM | POA: Diagnosis not present

## 2018-09-15 DIAGNOSIS — Z7982 Long term (current) use of aspirin: Secondary | ICD-10-CM | POA: Diagnosis not present

## 2018-09-15 DIAGNOSIS — I10 Essential (primary) hypertension: Secondary | ICD-10-CM | POA: Diagnosis not present

## 2018-09-15 DIAGNOSIS — Z96653 Presence of artificial knee joint, bilateral: Secondary | ICD-10-CM | POA: Diagnosis not present

## 2018-09-15 DIAGNOSIS — Z471 Aftercare following joint replacement surgery: Secondary | ICD-10-CM | POA: Diagnosis not present

## 2018-09-15 NOTE — Discharge Summary (Signed)
SPORTS MEDICINE & JOINT REPLACEMENT   Lara Mulch, MD   Carlyon Shadow, PA-C [place], [place], [place]  [address]                             [phone]  PATIENT ID: Johnathan Barker        MRN:  [id number]          DOB/AGE: 76-Jun-1942 / 76 y.o.    DISCHARGE SUMMARY  ADMISSION DATE:    09/13/2018 DISCHARGE DATE:   09/15/2018   ADMISSION DIAGNOSIS: S/P total knee replacement [Z96.659]    DISCHARGE DIAGNOSIS:  LT. Knee Osteoarthritis    ADDITIONAL DIAGNOSIS: Active Problems:   S/P total knee replacement  Past Medical History:  Diagnosis Date  . Arthritis    knees  . Diabetes mellitus without complication ([place])    type II  . Dysrhythmia   . History of kidney stones    lithotripsy  . Hypertension     PROCEDURE: Procedure(s): LEFT TOTAL KNEE ARTHROPLASTY on 09/13/2018  CONSULTS:    HISTORY:  See H&P in chart  HOSPITAL COURSE:  Johnathan Barker is a 77 y.o. admitted on 09/13/2018 and found to have a diagnosis of LT. Knee Osteoarthritis.  After appropriate laboratory studies were obtained  they were taken to the operating room on 09/13/2018 and underwent Procedure(s): LEFT TOTAL KNEE ARTHROPLASTY.   They were given perioperative antibiotics:  Anti-infectives (From admission, onward)   Start     Dose/Rate Route Frequency Ordered Stop   09/13/18 1500  clindamycin (CLEOCIN) IVPB 600 mg     600 mg 100 mL/hr over 30 Minutes Intravenous Every 6 hours 09/13/18 1204 09/13/18 2224   09/13/18 0600  clindamycin (CLEOCIN) IVPB 900 mg     900 mg 100 mL/hr over 30 Minutes Intravenous On call to O.R. 09/13/18 0543 09/13/18 0840    .  Patient given tranexamic acid IV or topical and exparel intra-operatively.  Tolerated the procedure well.    POD# 1: Vital signs were stable.  Patient denied Chest pain, shortness of breath, or calf pain.  Patient was started on Aspirin twice daily at 8am.  Consults to PT, OT, and care management were made.  The patient was weight  bearing as tolerated.  CPM was placed on the operative leg 0-90 degrees for 6-8 hours a day. When out of the CPM, patient was placed in the foam block to achieve full extension. Incentive spirometry was taught.  Dressing was changed.       POD #2, Continued  PT for ambulation and exercise program.  IV saline locked.  O2 discontinued.    The remainder of the hospital course was dedicated to ambulation and strengthening.   The patient was discharged on 2 Days Post-Op in  Good condition.  Blood products given:none  DIAGNOSTIC STUDIES: Recent vital signs: No data found.     Recent laboratory studies: Recent Labs    09/14/18 0434  WBC 16.9*  HGB 12.0*  HCT 36.0*  PLT 356   Recent Labs    09/14/18 0434  NA 139  K 4.1  CL 104  CO2 24  BUN 32*  CREATININE 1.09  GLUCOSE 154*  CALCIUM 8.7*   No results found for: INR, PROTIME   Recent Radiographic Studies :  Ct Head Wo Contrast  Result Date: 08/30/2018 CLINICAL DATA:  New onset seizure. EXAM: CT HEAD WITHOUT CONTRAST TECHNIQUE: Contiguous axial images were obtained  from the base of the skull through the vertex without intravenous contrast. COMPARISON:  None. FINDINGS: Brain: No evidence of acute infarction, hemorrhage, hydrocephalus, extra-axial collection or mass lesion/mass effect. Small old lacunar infarcts in the bilateral basal ganglia. Left greater than right frontal horn periventricular white matter hypodensities are nonspecific, but favored to reflect chronic microvascular ischemic changes. Vascular: Atherosclerotic vascular calcification of the carotid siphons. No hyperdense vessel. Skull: Normal. Negative for fracture or focal lesion. Sinuses/Orbits: No acute finding. Other: None. IMPRESSION: 1.  No acute intracranial abnormality. 2. 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 Electronically Signed   By: Titus Dubin M.D.   On: 08/30/2018 09:11    DISCHARGE INSTRUCTIONS: Discharge  Instructions    CPM   Complete by:  As directed    Continuous passive motion machine (CPM):      Use the CPM from 0 to 90 for 4-6 hours per day.      You may increase by 10 per day.  You may break it up into 2 or 3 sessions per day.      Use CPM for 2 weeks or until you are told to stop.   Call MD / Call 911   Complete by:  As directed    If you experience chest pain or shortness of breath, CALL 911 and be transported to the hospital emergency room.  If you develope a fever above 101 F, pus (white drainage) or increased drainage or redness at the wound, or calf pain, call your surgeon's office.   Constipation Prevention   Complete by:  As directed    Drink plenty of fluids.  Prune juice may be helpful.  You may use a stool softener, such as Colace (over the counter) 100 mg twice a day.  Use MiraLax (over the counter) for constipation as needed.   Diet - low sodium heart healthy   Complete by:  As directed    Discharge instructions   Complete by:  As directed    INSTRUCTIONS AFTER JOINT REPLACEMENT   Remove items at home which could result in a fall. This includes throw rugs or furniture in walking pathways ICE to the affected joint every three hours while awake for 30 minutes at a time, for at least the first 3-5 days, and then as needed for pain and swelling.  Continue to use ice for pain and swelling. You may notice swelling that will progress down to the foot and ankle.  This is normal after surgery.  Elevate your leg when you are not up walking on it.   Continue to use the breathing machine you got in the hospital (incentive spirometer) which will help keep your temperature down.  It is common for your temperature to cycle up and down following surgery, especially at night when you are not up moving around and exerting yourself.  The breathing machine keeps your lungs expanded and your temperature down.   DIET:  As you were doing prior to hospitalization, we recommend a well-balanced  diet.  DRESSING / WOUND CARE / SHOWERING  Keep the surgical dressing until follow up.  The dressing is water proof, so you can shower without any extra covering.  IF THE DRESSING FALLS OFF or the wound gets wet inside, change the dressing with sterile gauze.  Please use good hand washing techniques before changing the dressing.  Do not use any lotions or creams on the incision until instructed by your surgeon.    ACTIVITY  Increase activity slowly as tolerated, but follow the weight bearing instructions below.   No driving for 6 weeks or until further direction given by your physician.  You cannot drive while taking narcotics.  No lifting or carrying greater than 10 lbs. until further directed by your surgeon. Avoid periods of inactivity such as sitting longer than an hour when not asleep. This helps prevent blood clots.  You may return to work once you are authorized by your doctor.     WEIGHT BEARING   Weight bearing as tolerated with assist device (walker, cane, etc) as directed, use it as long as suggested by your surgeon or therapist, typically at least 4-6 weeks.   EXERCISES  Results after joint replacement surgery are often greatly improved when you follow the exercise, range of motion and muscle strengthening exercises prescribed by your doctor. Safety measures are also important to protect the joint from further injury. Any time any of these exercises cause you to have increased pain or swelling, decrease what you are doing until you are comfortable again and then slowly increase them. If you have problems or questions, call your caregiver or physical therapist for advice.   Rehabilitation is important following a joint replacement. After just a few days of immobilization, the muscles of the leg can become weakened and shrink (atrophy).  These exercises are designed to build up the tone and strength of the thigh and leg muscles and to improve motion. Often times heat used for twenty  to thirty minutes before working out will loosen up your tissues and help with improving the range of motion but do not use heat for the first two weeks following surgery (sometimes heat can increase post-operative swelling).   These exercises can be done on a training (exercise) mat, on the floor, on a table or on a bed. Use whatever works the best and is most comfortable for you.    Use music or television while you are exercising so that the exercises are a pleasant break in your day. This will make your life better with the exercises acting as a break in your routine that you can look forward to.   Perform all exercises about fifteen times, three times per day or as directed.  You should exercise both the operative leg and the other leg as well.   Exercises include:   Quad Sets - Tighten up the muscle on the front of the thigh (Quad) and hold for 5-10 seconds.   Straight Leg Raises - With your knee straight (if you were given a brace, keep it on), lift the leg to 60 degrees, hold for 3 seconds, and slowly lower the leg.  Perform this exercise against resistance later as your leg gets stronger.  Leg Slides: Lying on your back, slowly slide your foot toward your buttocks, bending your knee up off the floor (only go as far as is comfortable). Then slowly slide your foot back down until your leg is flat on the floor again.  Angel Wings: Lying on your back spread your legs to the side as far apart as you can without causing discomfort.  Hamstring Strength:  Lying on your back, push your heel against the floor with your leg straight by tightening up the muscles of your buttocks.  Repeat, but this time bend your knee to a comfortable angle, and push your heel against the floor.  You may put a pillow under the heel to make it more comfortable if necessary.  A rehabilitation program following joint replacement surgery can speed recovery and prevent re-injury in the future due to weakened muscles. Contact  your doctor or a physical therapist for more information on knee rehabilitation.    CONSTIPATION  Constipation is defined medically as fewer than three stools per week and severe constipation as less than one stool per week.  Even if you have a regular bowel pattern at home, your normal regimen is likely to be disrupted due to multiple reasons following surgery.  Combination of anesthesia, postoperative narcotics, change in appetite and fluid intake all can affect your bowels.   YOU MUST use at least one of the following options; they are listed in order of increasing strength to get the job done.  They are all available over the counter, and you may need to use some, POSSIBLY even all of these options:    Drink plenty of fluids (prune juice may be helpful) and high fiber foods Colace 100 mg by mouth twice a day  Senokot for constipation as directed and as needed Dulcolax (bisacodyl), take with full glass of water  Miralax (polyethylene glycol) once or twice a day as needed.  If you have tried all these things and are unable to have a bowel movement in the first 3-4 days after surgery call either your surgeon or your primary doctor.    If you experience loose stools or diarrhea, hold the medications until you stool forms back up.  If your symptoms do not get better within 1 week or if they get worse, check with your doctor.  If you experience "the worst abdominal pain ever" or develop nausea or vomiting, please contact the office immediately for further recommendations for treatment.   ITCHING:  If you experience itching with your medications, try taking only a single pain pill, or even half a pain pill at a time.  You can also use Benadryl over the counter for itching or also to help with sleep.   TED HOSE STOCKINGS:  Use stockings on both legs until for at least 2 weeks or as directed by physician office. They may be removed at night for sleeping.  MEDICATIONS:  See your medication summary  on the "After Visit Summary" that nursing will review with you.  You may have some home medications which will be placed on hold until you complete the course of blood thinner medication.  It is important for you to complete the blood thinner medication as prescribed.  PRECAUTIONS:  If you experience chest pain or shortness of breath - call 911 immediately for transfer to the hospital emergency department.   If you develop a fever greater that 101 F, purulent drainage from wound, increased redness or drainage from wound, foul odor from the wound/dressing, or calf pain - CONTACT YOUR SURGEON.                                                   FOLLOW-UP APPOINTMENTS:  If you do not already have a post-op appointment, please call the office for an appointment to be seen by your surgeon.  Guidelines for how soon to be seen are listed in your "After Visit Summary", but are typically between 1-4 weeks after surgery.  OTHER INSTRUCTIONS:   Knee Replacement:  Do not place pillow under knee, focus on keeping the knee straight while resting.  CPM instructions: 0-90 degrees, 2 hours in the morning, 2 hours in the afternoon, and 2 hours in the evening. Place foam block, curve side up under heel at all times except when in CPM or when walking.  DO NOT modify, tear, cut, or change the foam block in any way.  MAKE SURE YOU:  Understand these instructions.  Get help right away if you are not doing well or get worse.    Thank you for letting us be a part of your medical care team.  It is a privilege we respect greatly.  We hope these instructions will help you stay on track for a fast and full recovery!   Increase activity slowly as tolerated   Complete by:  As directed       DISCHARGE MEDICATIONS:   Allergies as of 09/14/2018      Reactions   Penicillins Hives, Rash, Other (See Comments)   FEVER PATIENT HAS HAD A PCN REACTION WITH IMMEDIATE RASH, FACIAL/TONGUE/THROAT SWELLING, SOB, OR LIGHTHEADEDNESS WITH  HYPOTENSION:  #  #  #  YES  #  #  #   Has patient had a PCN reaction causing severe rash involving mucus membranes or skin necrosis: No Has patient had a PCN reaction that required hospitalization: No Has patient had a PCN reaction occurring within the last 10 years: No If all of the above answers are "NO", then may proceed with Cephalosporin use.      Medication List    STOP taking these medications   acetaminophen 500 MG tablet Commonly known as:  TYLENOL   ibuprofen 800 MG tablet Commonly known as:  ADVIL,MOTRIN     TAKE these medications   amLODipine 5 MG tablet Commonly known as:  NORVASC Take 5 mg by mouth at bedtime.   aspirin 325 MG EC tablet Take 1 tablet (325 mg total) by mouth 2 (two) times daily. What changed:    how much to take  when to take this  reasons to take this   atorvastatin 10 MG tablet Commonly known as:  LIPITOR Take 10 mg by mouth daily.   FARXIGA 10 MG Tabs tablet Generic drug:  dapagliflozin propanediol Take 10 mg by mouth daily.   FISH OIL PO Take by mouth.   glimepiride 4 MG tablet Commonly known as:  AMARYL Take 4 mg by mouth 2 (two) times daily. Morning & evening   hydrochlorothiazide 25 MG tablet Commonly known as:  HYDRODIURIL Take 25 mg by mouth daily.   metFORMIN 1000 MG tablet Commonly known as:  GLUCOPHAGE Take 1,000 mg by mouth 2 (two) times daily with a meal. Morning & afternoon   methocarbamol 500 MG tablet Commonly known as:  ROBAXIN Take 1-2 tablets (500-1,000 mg total) by mouth every 6 (six) hours as needed for muscle spasms.   metoprolol succinate 50 MG 24 hr tablet Commonly known as:  TOPROL-XL Take 50 mg by mouth daily before breakfast. Take with or immediately following a meal.   oxyCODONE 5 MG immediate release tablet Commonly known as:  Oxy IR/ROXICODONE Take 1-2 tablets (5-10 mg total) by mouth every 6 (six) hours as needed for moderate pain (pain score 4-6).   valsartan 160 MG tablet Commonly known  as:  DIOVAN Take 320 mg by mouth daily.       FOLLOW UP VISIT:    DISPOSITION: HOME VS. SNF  CONDITION:  Good   Donia Ast 09/15/2018, 2:26 PM

## 2018-09-16 DIAGNOSIS — E119 Type 2 diabetes mellitus without complications: Secondary | ICD-10-CM | POA: Diagnosis not present

## 2018-09-16 DIAGNOSIS — Z471 Aftercare following joint replacement surgery: Secondary | ICD-10-CM | POA: Diagnosis not present

## 2018-09-16 DIAGNOSIS — Z7984 Long term (current) use of oral hypoglycemic drugs: Secondary | ICD-10-CM | POA: Diagnosis not present

## 2018-09-16 DIAGNOSIS — I10 Essential (primary) hypertension: Secondary | ICD-10-CM | POA: Diagnosis not present

## 2018-09-16 DIAGNOSIS — Z7982 Long term (current) use of aspirin: Secondary | ICD-10-CM | POA: Diagnosis not present

## 2018-09-16 DIAGNOSIS — Z96653 Presence of artificial knee joint, bilateral: Secondary | ICD-10-CM | POA: Diagnosis not present

## 2018-09-17 DIAGNOSIS — Z471 Aftercare following joint replacement surgery: Secondary | ICD-10-CM | POA: Diagnosis not present

## 2018-09-17 DIAGNOSIS — Z7984 Long term (current) use of oral hypoglycemic drugs: Secondary | ICD-10-CM | POA: Diagnosis not present

## 2018-09-17 DIAGNOSIS — I10 Essential (primary) hypertension: Secondary | ICD-10-CM | POA: Diagnosis not present

## 2018-09-17 DIAGNOSIS — Z7982 Long term (current) use of aspirin: Secondary | ICD-10-CM | POA: Diagnosis not present

## 2018-09-17 DIAGNOSIS — Z96653 Presence of artificial knee joint, bilateral: Secondary | ICD-10-CM | POA: Diagnosis not present

## 2018-09-17 DIAGNOSIS — E119 Type 2 diabetes mellitus without complications: Secondary | ICD-10-CM | POA: Diagnosis not present

## 2018-09-20 DIAGNOSIS — Z96653 Presence of artificial knee joint, bilateral: Secondary | ICD-10-CM | POA: Diagnosis not present

## 2018-09-20 DIAGNOSIS — E119 Type 2 diabetes mellitus without complications: Secondary | ICD-10-CM | POA: Diagnosis not present

## 2018-09-20 DIAGNOSIS — I10 Essential (primary) hypertension: Secondary | ICD-10-CM | POA: Diagnosis not present

## 2018-09-20 DIAGNOSIS — Z7982 Long term (current) use of aspirin: Secondary | ICD-10-CM | POA: Diagnosis not present

## 2018-09-20 DIAGNOSIS — Z7984 Long term (current) use of oral hypoglycemic drugs: Secondary | ICD-10-CM | POA: Diagnosis not present

## 2018-09-20 DIAGNOSIS — Z471 Aftercare following joint replacement surgery: Secondary | ICD-10-CM | POA: Diagnosis not present

## 2018-09-22 DIAGNOSIS — E119 Type 2 diabetes mellitus without complications: Secondary | ICD-10-CM | POA: Diagnosis not present

## 2018-09-22 DIAGNOSIS — Z96653 Presence of artificial knee joint, bilateral: Secondary | ICD-10-CM | POA: Diagnosis not present

## 2018-09-22 DIAGNOSIS — I10 Essential (primary) hypertension: Secondary | ICD-10-CM | POA: Diagnosis not present

## 2018-09-22 DIAGNOSIS — Z7982 Long term (current) use of aspirin: Secondary | ICD-10-CM | POA: Diagnosis not present

## 2018-09-22 DIAGNOSIS — Z471 Aftercare following joint replacement surgery: Secondary | ICD-10-CM | POA: Diagnosis not present

## 2018-09-22 DIAGNOSIS — Z7984 Long term (current) use of oral hypoglycemic drugs: Secondary | ICD-10-CM | POA: Diagnosis not present

## 2018-09-23 DIAGNOSIS — Z96652 Presence of left artificial knee joint: Secondary | ICD-10-CM | POA: Diagnosis not present

## 2018-09-24 DIAGNOSIS — R2689 Other abnormalities of gait and mobility: Secondary | ICD-10-CM | POA: Diagnosis not present

## 2018-09-24 DIAGNOSIS — M25562 Pain in left knee: Secondary | ICD-10-CM | POA: Diagnosis not present

## 2018-09-24 DIAGNOSIS — Z96652 Presence of left artificial knee joint: Secondary | ICD-10-CM | POA: Diagnosis not present

## 2018-09-24 DIAGNOSIS — Z4889 Encounter for other specified surgical aftercare: Secondary | ICD-10-CM | POA: Diagnosis not present

## 2018-09-27 DIAGNOSIS — Z4889 Encounter for other specified surgical aftercare: Secondary | ICD-10-CM | POA: Diagnosis not present

## 2018-09-27 DIAGNOSIS — M25562 Pain in left knee: Secondary | ICD-10-CM | POA: Diagnosis not present

## 2018-09-27 DIAGNOSIS — Z96652 Presence of left artificial knee joint: Secondary | ICD-10-CM | POA: Diagnosis not present

## 2018-09-27 DIAGNOSIS — R2689 Other abnormalities of gait and mobility: Secondary | ICD-10-CM | POA: Diagnosis not present

## 2018-09-29 DIAGNOSIS — M25562 Pain in left knee: Secondary | ICD-10-CM | POA: Diagnosis not present

## 2018-09-29 DIAGNOSIS — Z4889 Encounter for other specified surgical aftercare: Secondary | ICD-10-CM | POA: Diagnosis not present

## 2018-09-29 DIAGNOSIS — Z96652 Presence of left artificial knee joint: Secondary | ICD-10-CM | POA: Diagnosis not present

## 2018-09-29 DIAGNOSIS — R2689 Other abnormalities of gait and mobility: Secondary | ICD-10-CM | POA: Diagnosis not present

## 2018-09-30 DIAGNOSIS — Z7984 Long term (current) use of oral hypoglycemic drugs: Secondary | ICD-10-CM | POA: Diagnosis not present

## 2018-09-30 DIAGNOSIS — E119 Type 2 diabetes mellitus without complications: Secondary | ICD-10-CM | POA: Diagnosis not present

## 2018-09-30 DIAGNOSIS — B351 Tinea unguium: Secondary | ICD-10-CM | POA: Diagnosis not present

## 2018-10-01 DIAGNOSIS — Z4889 Encounter for other specified surgical aftercare: Secondary | ICD-10-CM | POA: Diagnosis not present

## 2018-10-01 DIAGNOSIS — M25562 Pain in left knee: Secondary | ICD-10-CM | POA: Diagnosis not present

## 2018-10-01 DIAGNOSIS — Z96652 Presence of left artificial knee joint: Secondary | ICD-10-CM | POA: Diagnosis not present

## 2018-10-01 DIAGNOSIS — R2689 Other abnormalities of gait and mobility: Secondary | ICD-10-CM | POA: Diagnosis not present

## 2018-10-04 DIAGNOSIS — Z4889 Encounter for other specified surgical aftercare: Secondary | ICD-10-CM | POA: Diagnosis not present

## 2018-10-04 DIAGNOSIS — R2689 Other abnormalities of gait and mobility: Secondary | ICD-10-CM | POA: Diagnosis not present

## 2018-10-04 DIAGNOSIS — Z96652 Presence of left artificial knee joint: Secondary | ICD-10-CM | POA: Diagnosis not present

## 2018-10-04 DIAGNOSIS — M25562 Pain in left knee: Secondary | ICD-10-CM | POA: Diagnosis not present

## 2018-10-06 DIAGNOSIS — Z96652 Presence of left artificial knee joint: Secondary | ICD-10-CM | POA: Diagnosis not present

## 2018-10-06 DIAGNOSIS — R2689 Other abnormalities of gait and mobility: Secondary | ICD-10-CM | POA: Diagnosis not present

## 2018-10-06 DIAGNOSIS — Z4889 Encounter for other specified surgical aftercare: Secondary | ICD-10-CM | POA: Diagnosis not present

## 2018-10-06 DIAGNOSIS — M25562 Pain in left knee: Secondary | ICD-10-CM | POA: Diagnosis not present

## 2018-10-08 DIAGNOSIS — Z4889 Encounter for other specified surgical aftercare: Secondary | ICD-10-CM | POA: Diagnosis not present

## 2018-10-08 DIAGNOSIS — R2689 Other abnormalities of gait and mobility: Secondary | ICD-10-CM | POA: Diagnosis not present

## 2018-10-08 DIAGNOSIS — Z96652 Presence of left artificial knee joint: Secondary | ICD-10-CM | POA: Diagnosis not present

## 2018-10-08 DIAGNOSIS — M25562 Pain in left knee: Secondary | ICD-10-CM | POA: Diagnosis not present

## 2018-10-11 DIAGNOSIS — Z96652 Presence of left artificial knee joint: Secondary | ICD-10-CM | POA: Diagnosis not present

## 2018-10-11 DIAGNOSIS — Z4889 Encounter for other specified surgical aftercare: Secondary | ICD-10-CM | POA: Diagnosis not present

## 2018-10-11 DIAGNOSIS — M25562 Pain in left knee: Secondary | ICD-10-CM | POA: Diagnosis not present

## 2018-10-11 DIAGNOSIS — R2689 Other abnormalities of gait and mobility: Secondary | ICD-10-CM | POA: Diagnosis not present

## 2018-10-13 DIAGNOSIS — Z4889 Encounter for other specified surgical aftercare: Secondary | ICD-10-CM | POA: Diagnosis not present

## 2018-10-13 DIAGNOSIS — M25562 Pain in left knee: Secondary | ICD-10-CM | POA: Diagnosis not present

## 2018-10-13 DIAGNOSIS — Z96652 Presence of left artificial knee joint: Secondary | ICD-10-CM | POA: Diagnosis not present

## 2018-10-13 DIAGNOSIS — R2689 Other abnormalities of gait and mobility: Secondary | ICD-10-CM | POA: Diagnosis not present

## 2018-10-15 DIAGNOSIS — Z4889 Encounter for other specified surgical aftercare: Secondary | ICD-10-CM | POA: Diagnosis not present

## 2018-10-15 DIAGNOSIS — Z96652 Presence of left artificial knee joint: Secondary | ICD-10-CM | POA: Diagnosis not present

## 2018-10-15 DIAGNOSIS — R2689 Other abnormalities of gait and mobility: Secondary | ICD-10-CM | POA: Diagnosis not present

## 2018-10-15 DIAGNOSIS — M25562 Pain in left knee: Secondary | ICD-10-CM | POA: Diagnosis not present

## 2018-10-18 DIAGNOSIS — Z4889 Encounter for other specified surgical aftercare: Secondary | ICD-10-CM | POA: Diagnosis not present

## 2018-10-18 DIAGNOSIS — Z96652 Presence of left artificial knee joint: Secondary | ICD-10-CM | POA: Diagnosis not present

## 2018-10-18 DIAGNOSIS — R2689 Other abnormalities of gait and mobility: Secondary | ICD-10-CM | POA: Diagnosis not present

## 2018-10-18 DIAGNOSIS — M25562 Pain in left knee: Secondary | ICD-10-CM | POA: Diagnosis not present

## 2018-10-27 DIAGNOSIS — M25562 Pain in left knee: Secondary | ICD-10-CM | POA: Diagnosis not present

## 2018-10-27 DIAGNOSIS — R2689 Other abnormalities of gait and mobility: Secondary | ICD-10-CM | POA: Diagnosis not present

## 2018-10-27 DIAGNOSIS — Z4889 Encounter for other specified surgical aftercare: Secondary | ICD-10-CM | POA: Diagnosis not present

## 2018-10-27 DIAGNOSIS — Z96652 Presence of left artificial knee joint: Secondary | ICD-10-CM | POA: Diagnosis not present

## 2018-10-29 DIAGNOSIS — M25562 Pain in left knee: Secondary | ICD-10-CM | POA: Diagnosis not present

## 2018-10-29 DIAGNOSIS — R2689 Other abnormalities of gait and mobility: Secondary | ICD-10-CM | POA: Diagnosis not present

## 2018-10-29 DIAGNOSIS — Z4889 Encounter for other specified surgical aftercare: Secondary | ICD-10-CM | POA: Diagnosis not present

## 2018-10-29 DIAGNOSIS — Z96652 Presence of left artificial knee joint: Secondary | ICD-10-CM | POA: Diagnosis not present

## 2018-11-01 DIAGNOSIS — M25562 Pain in left knee: Secondary | ICD-10-CM | POA: Diagnosis not present

## 2018-11-01 DIAGNOSIS — R2689 Other abnormalities of gait and mobility: Secondary | ICD-10-CM | POA: Diagnosis not present

## 2018-11-01 DIAGNOSIS — Z4889 Encounter for other specified surgical aftercare: Secondary | ICD-10-CM | POA: Diagnosis not present

## 2018-11-01 DIAGNOSIS — Z96652 Presence of left artificial knee joint: Secondary | ICD-10-CM | POA: Diagnosis not present

## 2018-11-04 DIAGNOSIS — Z125 Encounter for screening for malignant neoplasm of prostate: Secondary | ICD-10-CM | POA: Diagnosis not present

## 2018-11-04 DIAGNOSIS — E785 Hyperlipidemia, unspecified: Secondary | ICD-10-CM | POA: Diagnosis not present

## 2018-11-04 DIAGNOSIS — Z23 Encounter for immunization: Secondary | ICD-10-CM | POA: Diagnosis not present

## 2018-11-04 DIAGNOSIS — Z0000 Encounter for general adult medical examination without abnormal findings: Secondary | ICD-10-CM | POA: Diagnosis not present

## 2018-11-04 DIAGNOSIS — Z136 Encounter for screening for cardiovascular disorders: Secondary | ICD-10-CM | POA: Diagnosis not present

## 2018-11-15 DIAGNOSIS — Z4889 Encounter for other specified surgical aftercare: Secondary | ICD-10-CM | POA: Diagnosis not present

## 2018-11-15 DIAGNOSIS — R2689 Other abnormalities of gait and mobility: Secondary | ICD-10-CM | POA: Diagnosis not present

## 2018-11-15 DIAGNOSIS — M25562 Pain in left knee: Secondary | ICD-10-CM | POA: Diagnosis not present

## 2018-11-15 DIAGNOSIS — Z96652 Presence of left artificial knee joint: Secondary | ICD-10-CM | POA: Diagnosis not present

## 2018-11-19 DIAGNOSIS — Z96652 Presence of left artificial knee joint: Secondary | ICD-10-CM | POA: Diagnosis not present

## 2018-11-19 DIAGNOSIS — M25562 Pain in left knee: Secondary | ICD-10-CM | POA: Diagnosis not present

## 2018-11-19 DIAGNOSIS — Z4889 Encounter for other specified surgical aftercare: Secondary | ICD-10-CM | POA: Diagnosis not present

## 2018-11-19 DIAGNOSIS — R2689 Other abnormalities of gait and mobility: Secondary | ICD-10-CM | POA: Diagnosis not present

## 2018-12-17 DIAGNOSIS — I10 Essential (primary) hypertension: Secondary | ICD-10-CM | POA: Diagnosis not present

## 2018-12-17 DIAGNOSIS — E785 Hyperlipidemia, unspecified: Secondary | ICD-10-CM | POA: Diagnosis not present

## 2018-12-17 DIAGNOSIS — E1165 Type 2 diabetes mellitus with hyperglycemia: Secondary | ICD-10-CM | POA: Diagnosis not present

## 2018-12-30 DIAGNOSIS — Z96652 Presence of left artificial knee joint: Secondary | ICD-10-CM | POA: Diagnosis not present

## 2019-02-01 DIAGNOSIS — E119 Type 2 diabetes mellitus without complications: Secondary | ICD-10-CM | POA: Diagnosis not present

## 2019-02-01 DIAGNOSIS — Z7984 Long term (current) use of oral hypoglycemic drugs: Secondary | ICD-10-CM | POA: Diagnosis not present

## 2019-02-01 DIAGNOSIS — B351 Tinea unguium: Secondary | ICD-10-CM | POA: Diagnosis not present

## 2019-05-05 DIAGNOSIS — E785 Hyperlipidemia, unspecified: Secondary | ICD-10-CM | POA: Diagnosis not present

## 2019-05-05 DIAGNOSIS — H6123 Impacted cerumen, bilateral: Secondary | ICD-10-CM | POA: Diagnosis not present

## 2019-05-05 DIAGNOSIS — I10 Essential (primary) hypertension: Secondary | ICD-10-CM | POA: Diagnosis not present

## 2019-05-05 DIAGNOSIS — E1169 Type 2 diabetes mellitus with other specified complication: Secondary | ICD-10-CM | POA: Diagnosis not present

## 2019-05-05 DIAGNOSIS — Z9181 History of falling: Secondary | ICD-10-CM | POA: Diagnosis not present

## 2019-08-04 DIAGNOSIS — B351 Tinea unguium: Secondary | ICD-10-CM | POA: Diagnosis not present

## 2019-08-04 DIAGNOSIS — Z7984 Long term (current) use of oral hypoglycemic drugs: Secondary | ICD-10-CM | POA: Diagnosis not present

## 2019-08-04 DIAGNOSIS — E119 Type 2 diabetes mellitus without complications: Secondary | ICD-10-CM | POA: Diagnosis not present

## 2019-08-08 DIAGNOSIS — I10 Essential (primary) hypertension: Secondary | ICD-10-CM | POA: Diagnosis not present

## 2019-08-08 DIAGNOSIS — Z125 Encounter for screening for malignant neoplasm of prostate: Secondary | ICD-10-CM | POA: Diagnosis not present

## 2019-08-08 DIAGNOSIS — E785 Hyperlipidemia, unspecified: Secondary | ICD-10-CM | POA: Diagnosis not present

## 2019-08-08 DIAGNOSIS — E1169 Type 2 diabetes mellitus with other specified complication: Secondary | ICD-10-CM | POA: Diagnosis not present

## 2019-08-08 DIAGNOSIS — Z1331 Encounter for screening for depression: Secondary | ICD-10-CM | POA: Diagnosis not present

## 2019-09-23 DIAGNOSIS — Z23 Encounter for immunization: Secondary | ICD-10-CM | POA: Diagnosis not present

## 2019-10-17 DIAGNOSIS — R05 Cough: Secondary | ICD-10-CM | POA: Diagnosis not present

## 2019-10-17 DIAGNOSIS — Z20828 Contact with and (suspected) exposure to other viral communicable diseases: Secondary | ICD-10-CM | POA: Diagnosis not present

## 2019-10-17 DIAGNOSIS — J029 Acute pharyngitis, unspecified: Secondary | ICD-10-CM | POA: Diagnosis not present

## 2020-01-02 DIAGNOSIS — Z23 Encounter for immunization: Secondary | ICD-10-CM | POA: Diagnosis not present

## 2020-01-18 DIAGNOSIS — H8113 Benign paroxysmal vertigo, bilateral: Secondary | ICD-10-CM | POA: Diagnosis not present

## 2020-01-30 DIAGNOSIS — Z23 Encounter for immunization: Secondary | ICD-10-CM | POA: Diagnosis not present

## 2020-02-02 DIAGNOSIS — B351 Tinea unguium: Secondary | ICD-10-CM | POA: Diagnosis not present

## 2020-02-02 DIAGNOSIS — M19072 Primary osteoarthritis, left ankle and foot: Secondary | ICD-10-CM | POA: Diagnosis not present

## 2020-02-02 DIAGNOSIS — E1165 Type 2 diabetes mellitus with hyperglycemia: Secondary | ICD-10-CM | POA: Diagnosis not present

## 2020-02-02 DIAGNOSIS — M19071 Primary osteoarthritis, right ankle and foot: Secondary | ICD-10-CM | POA: Diagnosis not present

## 2020-02-08 DIAGNOSIS — I10 Essential (primary) hypertension: Secondary | ICD-10-CM | POA: Diagnosis not present

## 2020-02-08 DIAGNOSIS — E1169 Type 2 diabetes mellitus with other specified complication: Secondary | ICD-10-CM | POA: Diagnosis not present

## 2020-02-08 DIAGNOSIS — H6123 Impacted cerumen, bilateral: Secondary | ICD-10-CM | POA: Diagnosis not present

## 2020-02-08 DIAGNOSIS — E785 Hyperlipidemia, unspecified: Secondary | ICD-10-CM | POA: Diagnosis not present

## 2020-02-13 DIAGNOSIS — E785 Hyperlipidemia, unspecified: Secondary | ICD-10-CM | POA: Diagnosis not present

## 2020-02-13 DIAGNOSIS — E1169 Type 2 diabetes mellitus with other specified complication: Secondary | ICD-10-CM | POA: Diagnosis not present

## 2020-03-19 DIAGNOSIS — I10 Essential (primary) hypertension: Secondary | ICD-10-CM | POA: Diagnosis not present

## 2020-03-19 DIAGNOSIS — E1169 Type 2 diabetes mellitus with other specified complication: Secondary | ICD-10-CM | POA: Diagnosis not present

## 2020-03-19 DIAGNOSIS — E785 Hyperlipidemia, unspecified: Secondary | ICD-10-CM | POA: Diagnosis not present

## 2020-04-23 ENCOUNTER — Encounter (HOSPITAL_COMMUNITY): Payer: Self-pay

## 2020-04-23 ENCOUNTER — Inpatient Hospital Stay (HOSPITAL_COMMUNITY)
Admission: EM | Admit: 2020-04-23 | Discharge: 2020-04-27 | DRG: 472 | Disposition: A | Discharge status: Rehabilitation Facility | Payer: Medicare Other | Attending: Neurological Surgery | Admitting: Neurological Surgery

## 2020-04-23 ENCOUNTER — Other Ambulatory Visit: Payer: Self-pay

## 2020-04-23 DIAGNOSIS — R270 Ataxia, unspecified: Secondary | ICD-10-CM

## 2020-04-23 DIAGNOSIS — I10 Essential (primary) hypertension: Secondary | ICD-10-CM | POA: Diagnosis present

## 2020-04-23 DIAGNOSIS — M48061 Spinal stenosis, lumbar region without neurogenic claudication: Secondary | ICD-10-CM | POA: Diagnosis not present

## 2020-04-23 DIAGNOSIS — R17 Unspecified jaundice: Secondary | ICD-10-CM | POA: Diagnosis not present

## 2020-04-23 DIAGNOSIS — G992 Myelopathy in diseases classified elsewhere: Secondary | ICD-10-CM | POA: Diagnosis present

## 2020-04-23 DIAGNOSIS — M4804 Spinal stenosis, thoracic region: Secondary | ICD-10-CM | POA: Diagnosis present

## 2020-04-23 DIAGNOSIS — M47812 Spondylosis without myelopathy or radiculopathy, cervical region: Secondary | ICD-10-CM | POA: Diagnosis present

## 2020-04-23 DIAGNOSIS — D72829 Elevated white blood cell count, unspecified: Secondary | ICD-10-CM

## 2020-04-23 DIAGNOSIS — R531 Weakness: Secondary | ICD-10-CM | POA: Diagnosis not present

## 2020-04-23 DIAGNOSIS — R296 Repeated falls: Secondary | ICD-10-CM | POA: Diagnosis present

## 2020-04-23 DIAGNOSIS — Z419 Encounter for procedure for purposes other than remedying health state, unspecified: Secondary | ICD-10-CM

## 2020-04-23 DIAGNOSIS — E1165 Type 2 diabetes mellitus with hyperglycemia: Secondary | ICD-10-CM

## 2020-04-23 DIAGNOSIS — Z20822 Contact with and (suspected) exposure to covid-19: Secondary | ICD-10-CM | POA: Diagnosis present

## 2020-04-23 DIAGNOSIS — E119 Type 2 diabetes mellitus without complications: Secondary | ICD-10-CM

## 2020-04-23 DIAGNOSIS — G959 Disease of spinal cord, unspecified: Secondary | ICD-10-CM | POA: Diagnosis present

## 2020-04-23 DIAGNOSIS — G9520 Unspecified cord compression: Secondary | ICD-10-CM | POA: Diagnosis present

## 2020-04-23 DIAGNOSIS — E785 Hyperlipidemia, unspecified: Secondary | ICD-10-CM | POA: Diagnosis present

## 2020-04-23 DIAGNOSIS — M4802 Spinal stenosis, cervical region: Secondary | ICD-10-CM | POA: Diagnosis not present

## 2020-04-23 DIAGNOSIS — R29898 Other symptoms and signs involving the musculoskeletal system: Secondary | ICD-10-CM | POA: Diagnosis present

## 2020-04-23 DIAGNOSIS — T380X5A Adverse effect of glucocorticoids and synthetic analogues, initial encounter: Secondary | ICD-10-CM | POA: Diagnosis present

## 2020-04-23 DIAGNOSIS — K5903 Drug induced constipation: Secondary | ICD-10-CM

## 2020-04-23 DIAGNOSIS — Z96653 Presence of artificial knee joint, bilateral: Secondary | ICD-10-CM | POA: Diagnosis present

## 2020-04-23 DIAGNOSIS — K5900 Constipation, unspecified: Secondary | ICD-10-CM | POA: Diagnosis present

## 2020-04-23 DIAGNOSIS — E041 Nontoxic single thyroid nodule: Secondary | ICD-10-CM | POA: Diagnosis present

## 2020-04-23 DIAGNOSIS — R262 Difficulty in walking, not elsewhere classified: Secondary | ICD-10-CM | POA: Diagnosis not present

## 2020-04-23 DIAGNOSIS — Z794 Long term (current) use of insulin: Secondary | ICD-10-CM

## 2020-04-23 LAB — COMPREHENSIVE METABOLIC PANEL
ALT: 19 U/L (ref 0–44)
AST: 20 U/L (ref 15–41)
Albumin: 3.6 g/dL (ref 3.5–5.0)
Alkaline Phosphatase: 91 U/L (ref 38–126)
Anion gap: 10 (ref 5–15)
BUN: 18 mg/dL (ref 8–23)
CO2: 25 mmol/L (ref 22–32)
Calcium: 8.5 mg/dL — ABNORMAL LOW (ref 8.9–10.3)
Chloride: 104 mmol/L (ref 98–111)
Creatinine, Ser: 0.85 mg/dL (ref 0.61–1.24)
GFR calc Af Amer: >60 mL/min (ref >60)
GFR calc non Af Amer: >60 mL/min (ref >60)
Glucose, Bld: 255 mg/dL — ABNORMAL HIGH (ref 70–99)
Potassium: 3.9 mmol/L (ref 3.5–5.1)
Sodium: 139 mmol/L (ref 135–145)
Total Bilirubin: 1.5 mg/dL — ABNORMAL HIGH (ref 0.3–1.2)
Total Protein: 6.6 g/dL (ref 6.5–8.1)

## 2020-04-23 LAB — URINALYSIS, ROUTINE W REFLEX MICROSCOPIC
Bacteria, UA: NONE SEEN
Bilirubin Urine: NEGATIVE
Glucose, UA: >=500 mg/dL — AB
Hgb urine dipstick: NEGATIVE
Ketones, ur: 20 mg/dL — AB
Leukocytes,Ua: NEGATIVE
Nitrite: NEGATIVE
Protein, ur: NEGATIVE mg/dL
Specific Gravity, Urine: 1.02 (ref 1.005–1.030)
pH: 6 (ref 5.0–8.0)

## 2020-04-23 LAB — CBC WITH DIFFERENTIAL/PLATELET
Abs Immature Granulocytes: 0.03 10*3/uL (ref 0.00–0.07)
Basophils Absolute: 0.1 10*3/uL (ref 0.0–0.1)
Basophils Relative: 1 %
Eosinophils Absolute: 0.3 10*3/uL (ref 0.0–0.5)
Eosinophils Relative: 2 %
HCT: 39.1 % (ref 39.0–52.0)
Hemoglobin: 13.2 g/dL (ref 13.0–17.0)
Immature Granulocytes: 0 %
Lymphocytes Relative: 18 %
Lymphs Abs: 2.1 10*3/uL (ref 0.7–4.0)
MCH: 30.3 pg (ref 26.0–34.0)
MCHC: 33.8 g/dL (ref 30.0–36.0)
MCV: 89.9 fL (ref 80.0–100.0)
Monocytes Absolute: 1 10*3/uL (ref 0.1–1.0)
Monocytes Relative: 8 %
Neutro Abs: 8.2 10*3/uL — ABNORMAL HIGH (ref 1.7–7.7)
Neutrophils Relative %: 71 %
Platelets: 330 10*3/uL (ref 150–400)
RBC: 4.35 MIL/uL (ref 4.22–5.81)
RDW: 12.8 % (ref 11.5–15.5)
WBC: 11.7 10*3/uL — ABNORMAL HIGH (ref 4.0–10.5)
nRBC: 0 % (ref 0.0–0.2)

## 2020-04-23 LAB — CBG MONITORING, ED: Glucose-Capillary: 228 mg/dL — ABNORMAL HIGH (ref 70–99)

## 2020-04-23 NOTE — ED Notes (Signed)
This Probation officer assisted patient with using the urinal. Pt was not steady on his feet. Pt held on to bed rails while this Probation officer and patients wife held onto his arms while this Probation officer assisted with urination.

## 2020-04-23 NOTE — ED Triage Notes (Addendum)
Pt sts he has been having difficulty ambulating for a few days. Has been using a walker for 2-3 months. Pt sts right foot is feeling colder and is giving out when he tries to walk. Thinks it is related to his diabetes. cbg 355 ems administered 600cc NS.

## 2020-04-24 ENCOUNTER — Inpatient Hospital Stay (HOSPITAL_COMMUNITY): Payer: Medicare Other

## 2020-04-24 ENCOUNTER — Encounter (HOSPITAL_COMMUNITY): Payer: Self-pay | Admitting: Internal Medicine

## 2020-04-24 ENCOUNTER — Emergency Department (HOSPITAL_COMMUNITY): Payer: Medicare Other

## 2020-04-24 ENCOUNTER — Other Ambulatory Visit: Payer: Self-pay | Admitting: Neurological Surgery

## 2020-04-24 DIAGNOSIS — M542 Cervicalgia: Secondary | ICD-10-CM | POA: Diagnosis not present

## 2020-04-24 DIAGNOSIS — E1165 Type 2 diabetes mellitus with hyperglycemia: Secondary | ICD-10-CM | POA: Diagnosis not present

## 2020-04-24 DIAGNOSIS — M4804 Spinal stenosis, thoracic region: Secondary | ICD-10-CM | POA: Diagnosis present

## 2020-04-24 DIAGNOSIS — E041 Nontoxic single thyroid nodule: Secondary | ICD-10-CM | POA: Diagnosis present

## 2020-04-24 DIAGNOSIS — R410 Disorientation, unspecified: Secondary | ICD-10-CM | POA: Diagnosis not present

## 2020-04-24 DIAGNOSIS — D72829 Elevated white blood cell count, unspecified: Secondary | ICD-10-CM | POA: Diagnosis present

## 2020-04-24 DIAGNOSIS — R739 Hyperglycemia, unspecified: Secondary | ICD-10-CM | POA: Diagnosis not present

## 2020-04-24 DIAGNOSIS — R7309 Other abnormal glucose: Secondary | ICD-10-CM | POA: Diagnosis not present

## 2020-04-24 DIAGNOSIS — M47812 Spondylosis without myelopathy or radiculopathy, cervical region: Secondary | ICD-10-CM | POA: Diagnosis present

## 2020-04-24 DIAGNOSIS — M545 Low back pain: Secondary | ICD-10-CM | POA: Diagnosis not present

## 2020-04-24 DIAGNOSIS — G959 Disease of spinal cord, unspecified: Secondary | ICD-10-CM | POA: Diagnosis not present

## 2020-04-24 DIAGNOSIS — R29818 Other symptoms and signs involving the nervous system: Secondary | ICD-10-CM | POA: Diagnosis not present

## 2020-04-24 DIAGNOSIS — R296 Repeated falls: Secondary | ICD-10-CM | POA: Diagnosis present

## 2020-04-24 DIAGNOSIS — Z794 Long term (current) use of insulin: Secondary | ICD-10-CM | POA: Diagnosis not present

## 2020-04-24 DIAGNOSIS — G9520 Unspecified cord compression: Secondary | ICD-10-CM | POA: Diagnosis not present

## 2020-04-24 DIAGNOSIS — Z4789 Encounter for other orthopedic aftercare: Secondary | ICD-10-CM | POA: Diagnosis not present

## 2020-04-24 DIAGNOSIS — R339 Retention of urine, unspecified: Secondary | ICD-10-CM | POA: Diagnosis not present

## 2020-04-24 DIAGNOSIS — A419 Sepsis, unspecified organism: Secondary | ICD-10-CM | POA: Diagnosis not present

## 2020-04-24 DIAGNOSIS — M546 Pain in thoracic spine: Secondary | ICD-10-CM | POA: Diagnosis not present

## 2020-04-24 DIAGNOSIS — R270 Ataxia, unspecified: Secondary | ICD-10-CM | POA: Diagnosis not present

## 2020-04-24 DIAGNOSIS — K5903 Drug induced constipation: Secondary | ICD-10-CM | POA: Diagnosis not present

## 2020-04-24 DIAGNOSIS — M4802 Spinal stenosis, cervical region: Secondary | ICD-10-CM | POA: Diagnosis present

## 2020-04-24 DIAGNOSIS — R4587 Impulsiveness: Secondary | ICD-10-CM | POA: Diagnosis not present

## 2020-04-24 DIAGNOSIS — S14109S Unspecified injury at unspecified level of cervical spinal cord, sequela: Secondary | ICD-10-CM | POA: Diagnosis not present

## 2020-04-24 DIAGNOSIS — R29898 Other symptoms and signs involving the musculoskeletal system: Secondary | ICD-10-CM

## 2020-04-24 DIAGNOSIS — R1310 Dysphagia, unspecified: Secondary | ICD-10-CM | POA: Diagnosis not present

## 2020-04-24 DIAGNOSIS — K5900 Constipation, unspecified: Secondary | ICD-10-CM | POA: Diagnosis present

## 2020-04-24 DIAGNOSIS — R4689 Other symptoms and signs involving appearance and behavior: Secondary | ICD-10-CM | POA: Diagnosis not present

## 2020-04-24 DIAGNOSIS — J849 Interstitial pulmonary disease, unspecified: Secondary | ICD-10-CM | POA: Diagnosis not present

## 2020-04-24 DIAGNOSIS — R41841 Cognitive communication deficit: Secondary | ICD-10-CM | POA: Diagnosis not present

## 2020-04-24 DIAGNOSIS — R17 Unspecified jaundice: Secondary | ICD-10-CM | POA: Diagnosis present

## 2020-04-24 DIAGNOSIS — E785 Hyperlipidemia, unspecified: Secondary | ICD-10-CM | POA: Diagnosis present

## 2020-04-24 DIAGNOSIS — R05 Cough: Secondary | ICD-10-CM | POA: Diagnosis not present

## 2020-04-24 DIAGNOSIS — J690 Pneumonitis due to inhalation of food and vomit: Secondary | ICD-10-CM | POA: Diagnosis not present

## 2020-04-24 DIAGNOSIS — R9389 Abnormal findings on diagnostic imaging of other specified body structures: Secondary | ICD-10-CM | POA: Diagnosis not present

## 2020-04-24 DIAGNOSIS — M47813 Spondylosis without myelopathy or radiculopathy, cervicothoracic region: Secondary | ICD-10-CM | POA: Diagnosis not present

## 2020-04-24 DIAGNOSIS — Z20822 Contact with and (suspected) exposure to covid-19: Secondary | ICD-10-CM | POA: Diagnosis present

## 2020-04-24 DIAGNOSIS — Z419 Encounter for procedure for purposes other than remedying health state, unspecified: Secondary | ICD-10-CM | POA: Diagnosis not present

## 2020-04-24 DIAGNOSIS — R1313 Dysphagia, pharyngeal phase: Secondary | ICD-10-CM | POA: Diagnosis not present

## 2020-04-24 DIAGNOSIS — R4189 Other symptoms and signs involving cognitive functions and awareness: Secondary | ICD-10-CM | POA: Diagnosis not present

## 2020-04-24 DIAGNOSIS — R531 Weakness: Secondary | ICD-10-CM | POA: Diagnosis not present

## 2020-04-24 DIAGNOSIS — R262 Difficulty in walking, not elsewhere classified: Secondary | ICD-10-CM | POA: Diagnosis not present

## 2020-04-24 DIAGNOSIS — G992 Myelopathy in diseases classified elsewhere: Secondary | ICD-10-CM | POA: Diagnosis present

## 2020-04-24 DIAGNOSIS — I10 Essential (primary) hypertension: Secondary | ICD-10-CM | POA: Diagnosis present

## 2020-04-24 DIAGNOSIS — R269 Unspecified abnormalities of gait and mobility: Secondary | ICD-10-CM | POA: Diagnosis not present

## 2020-04-24 DIAGNOSIS — G479 Sleep disorder, unspecified: Secondary | ICD-10-CM | POA: Diagnosis not present

## 2020-04-24 DIAGNOSIS — T380X5A Adverse effect of glucocorticoids and synthetic analogues, initial encounter: Secondary | ICD-10-CM | POA: Diagnosis not present

## 2020-04-24 DIAGNOSIS — M4803 Spinal stenosis, cervicothoracic region: Secondary | ICD-10-CM | POA: Diagnosis not present

## 2020-04-24 DIAGNOSIS — R7881 Bacteremia: Secondary | ICD-10-CM | POA: Diagnosis not present

## 2020-04-24 DIAGNOSIS — D72828 Other elevated white blood cell count: Secondary | ICD-10-CM | POA: Diagnosis not present

## 2020-04-24 DIAGNOSIS — R509 Fever, unspecified: Secondary | ICD-10-CM | POA: Diagnosis not present

## 2020-04-24 DIAGNOSIS — Z96653 Presence of artificial knee joint, bilateral: Secondary | ICD-10-CM | POA: Diagnosis present

## 2020-04-24 DIAGNOSIS — M48061 Spinal stenosis, lumbar region without neurogenic claudication: Secondary | ICD-10-CM | POA: Diagnosis present

## 2020-04-24 DIAGNOSIS — Z981 Arthrodesis status: Secondary | ICD-10-CM | POA: Diagnosis not present

## 2020-04-24 DIAGNOSIS — E119 Type 2 diabetes mellitus without complications: Secondary | ICD-10-CM

## 2020-04-24 DIAGNOSIS — G8918 Other acute postprocedural pain: Secondary | ICD-10-CM | POA: Diagnosis not present

## 2020-04-24 DIAGNOSIS — M9920 Subluxation stenosis of neural canal of head region: Secondary | ICD-10-CM | POA: Diagnosis not present

## 2020-04-24 HISTORY — DX: Other symptoms and signs involving the musculoskeletal system: R29.898

## 2020-04-24 LAB — SARS CORONAVIRUS 2 BY RT PCR (HOSPITAL ORDER, PERFORMED IN ~~LOC~~ HOSPITAL LAB): SARS Coronavirus 2: NEGATIVE

## 2020-04-24 LAB — MAGNESIUM: Magnesium: 1.8 mg/dL (ref 1.7–2.4)

## 2020-04-24 LAB — CBC
HCT: 40 % (ref 39.0–52.0)
Hemoglobin: 13.7 g/dL (ref 13.0–17.0)
MCH: 30.6 pg (ref 26.0–34.0)
MCHC: 34.3 g/dL (ref 30.0–36.0)
MCV: 89.5 fL (ref 80.0–100.0)
Platelets: 332 10*3/uL (ref 150–400)
RBC: 4.47 MIL/uL (ref 4.22–5.81)
RDW: 12.8 % (ref 11.5–15.5)
WBC: 13.4 10*3/uL — ABNORMAL HIGH (ref 4.0–10.5)
nRBC: 0 % (ref 0.0–0.2)

## 2020-04-24 LAB — BASIC METABOLIC PANEL
Anion gap: 8 (ref 5–15)
BUN: 14 mg/dL (ref 8–23)
CO2: 27 mmol/L (ref 22–32)
Calcium: 8.6 mg/dL — ABNORMAL LOW (ref 8.9–10.3)
Chloride: 105 mmol/L (ref 98–111)
Creatinine, Ser: 0.81 mg/dL (ref 0.61–1.24)
GFR calc Af Amer: >60 mL/min (ref >60)
GFR calc non Af Amer: >60 mL/min (ref >60)
Glucose, Bld: 216 mg/dL — ABNORMAL HIGH (ref 70–99)
Potassium: 3.8 mmol/L (ref 3.5–5.1)
Sodium: 140 mmol/L (ref 135–145)

## 2020-04-24 LAB — HEMOGLOBIN A1C
Hgb A1c MFr Bld: 7.5 % — ABNORMAL HIGH (ref 4.8–5.6)
Mean Plasma Glucose: 168.55 mg/dL

## 2020-04-24 LAB — VITAMIN B12: Vitamin B-12: 545 pg/mL (ref 180–914)

## 2020-04-24 LAB — CBG MONITORING, ED: Glucose-Capillary: 209 mg/dL — ABNORMAL HIGH (ref 70–99)

## 2020-04-24 IMAGING — MR MR HEAD W/O CM
10 series · 42 of 48 positions shown · non-contrast
Comparison: [DATE] CT head.

CLINICAL DATA: Focal neurological deficit, stroke suspected.

EXAM:
MRI HEAD WITHOUT CONTRAST
TECHNIQUE: Multiplanar, multiecho pulse sequences of the brain and surrounding
structures were obtained without intravenous contrast.

[Series 5: dwi_tracew · axial · 3.0mm · 1.08mm/px · z∈[-37,+118]mm · 8 of 110 slices shown]
[im 1/110]
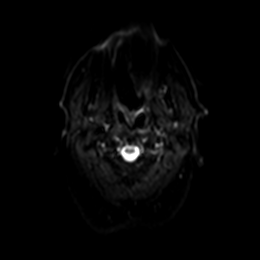
[im 22/110]
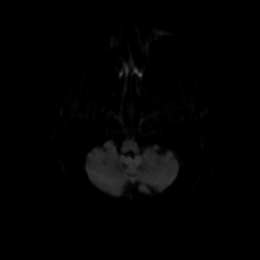
[im 33/110]
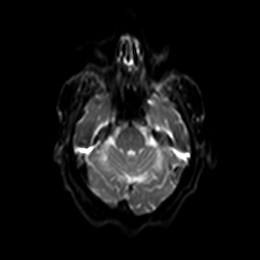
[im 44/110]
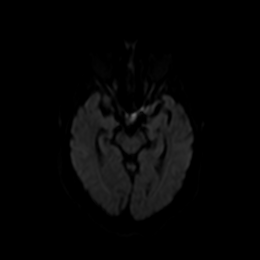
[im 66/110]
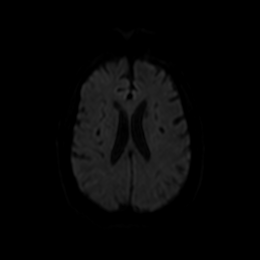
[im 77/110]
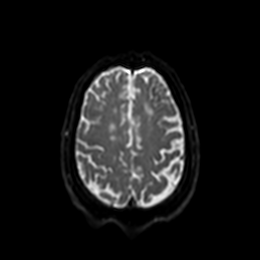
[im 88/110]
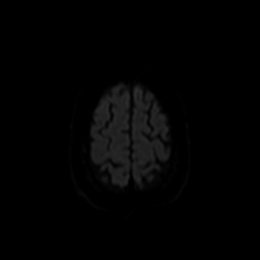
[im 110/110]
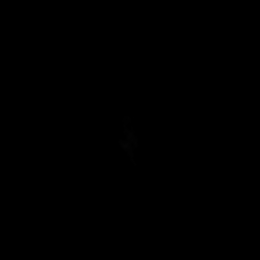

[Series 6: dwi_adc · axial · 3.0mm · 1.08mm/px · z∈[-37,+0]mm · 2 of 53 slices shown]
[im 1/53]
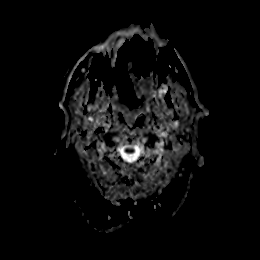
[im 14/53]
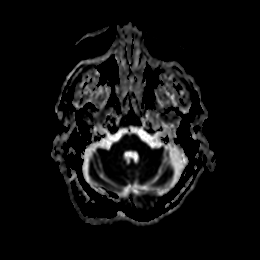

[Series 7: T2 · sagittal · 5.0mm · 0.47mm/px · 2 of 24 slices shown (1 of 3)]
[im 1/24]
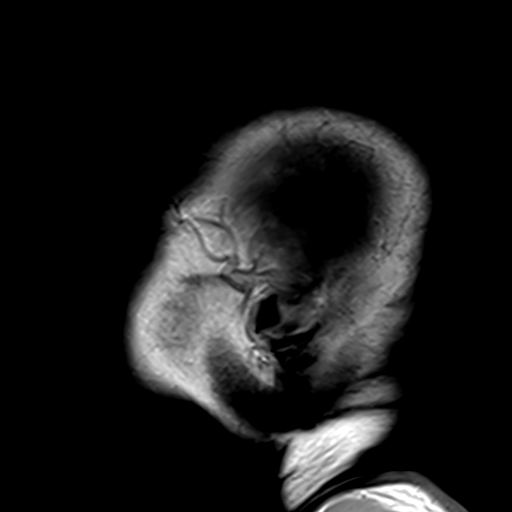
[im 24/24]
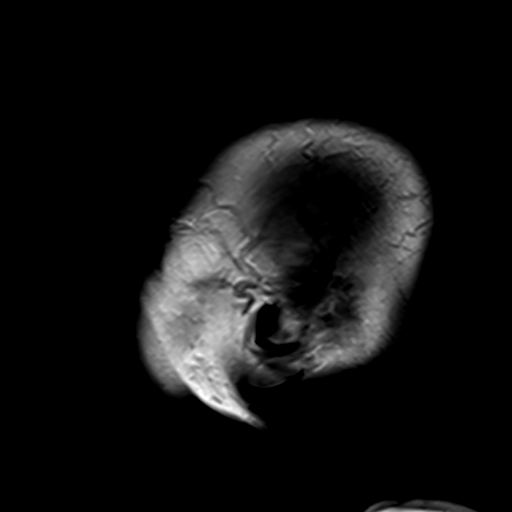

[Series 8: T2 · axial · 5.0mm · 0.45mm/px · z∈[-51,+110]mm · 3 of 27 slices shown (2 of 3)]
[im 1/27]
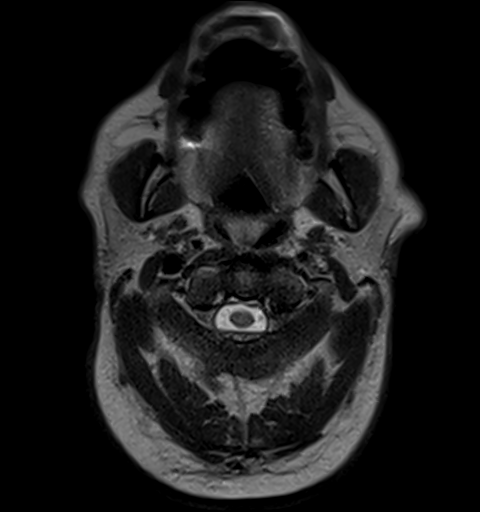
[im 14/27]
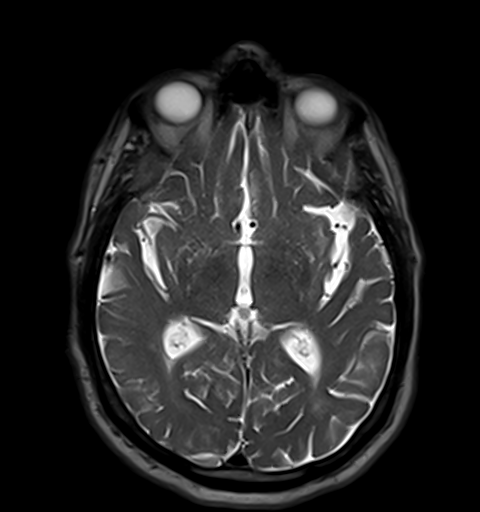
[im 27/27]
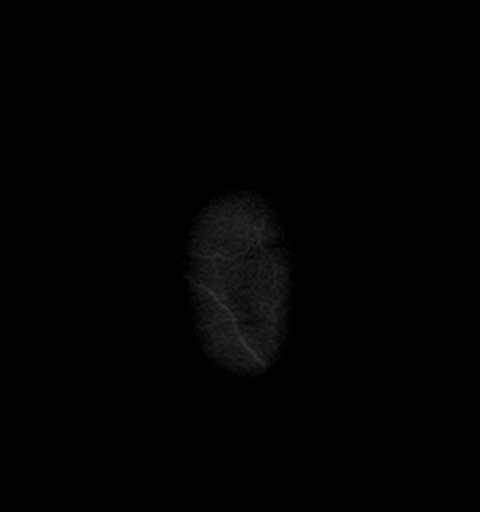

[Series 9: GRE · axial · 3.0mm · 0.45mm/px · z∈[-44,+116]mm · 5 of 57 slices shown]
[im 1/57]
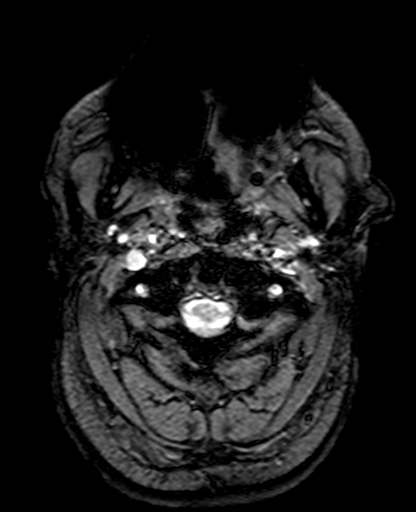
[im 15/57]
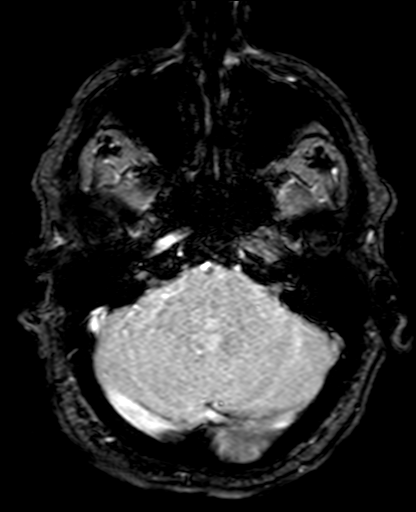
[im 29/57]
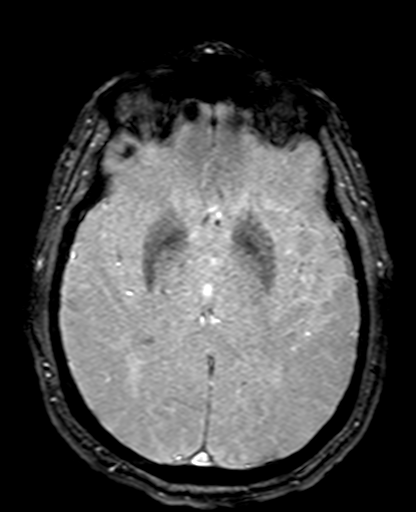
[im 43/57]
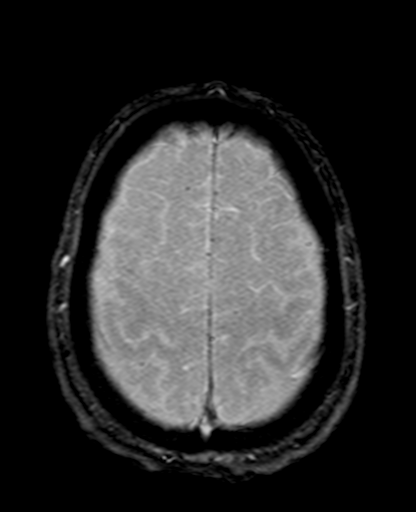
[im 57/57]
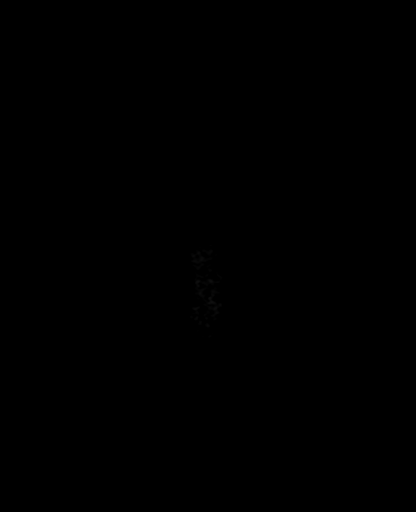

[Series 10: FLAIR · axial · 3.0mm · 0.86mm/px · z∈[-47,+107]mm · 5 of 55 slices shown]
[im 1/55]
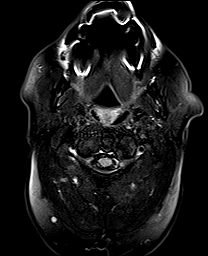
[im 14/55]
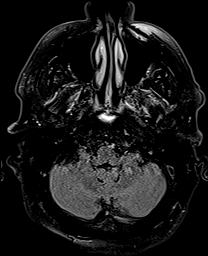
[im 28/55]
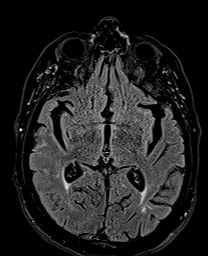
[im 41/55]
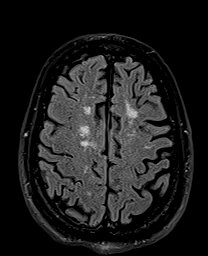
[im 55/55]
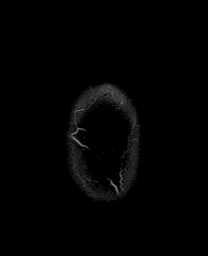

[Series 11: T1 · axial · 3.0mm · 0.45mm/px · z∈[-41,+113]mm · 5 of 55 slices shown]
[im 1/55]
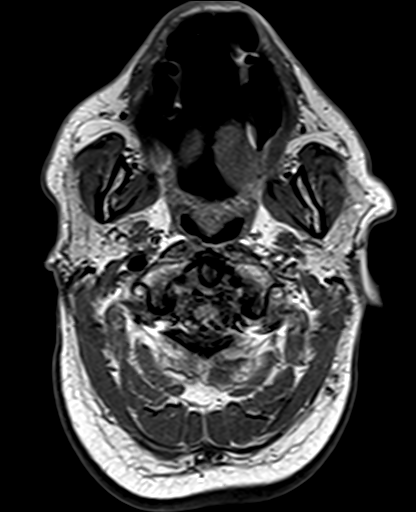
[im 14/55]
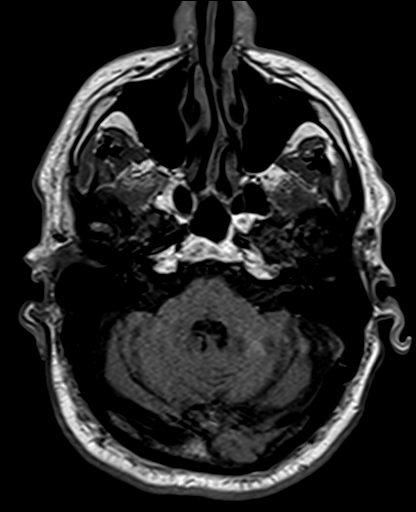
[im 28/55]
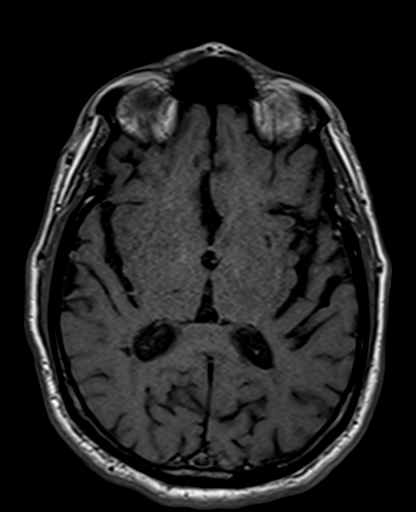
[im 41/55]
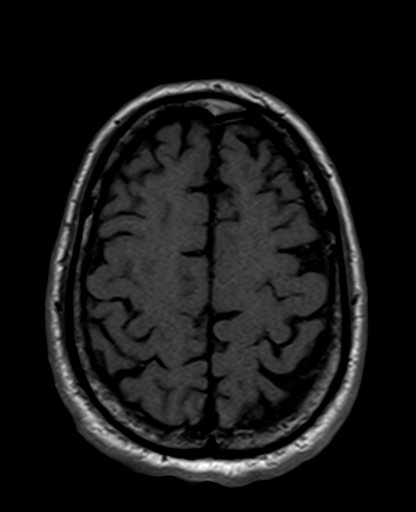
[im 55/55]
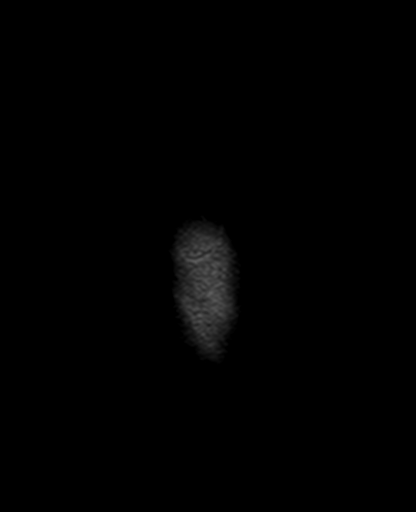

[Series 12: DWI · coronal · 5.0mm · 1.31mm/px · 6 of 64 slices shown (1 of 2)]
[im 1/64]
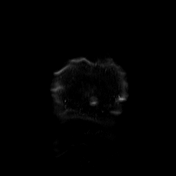
[im 13/64]
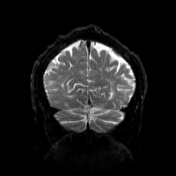
[im 26/64]
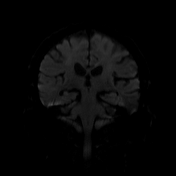
[im 38/64]
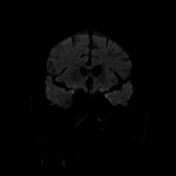
[im 51/64]
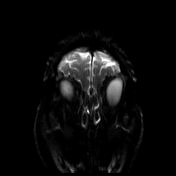
[im 64/64]
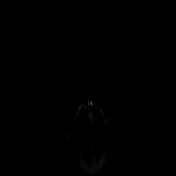

[Series 13: DWI · coronal · 5.0mm · 1.31mm/px · 3 of 32 slices shown (2 of 2)]
[im 1/32]
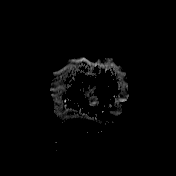
[im 16/32]
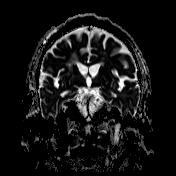
[im 32/32]
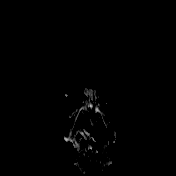

[Series 14: T2 · coronal · 5.0mm · 0.86mm/px · 3 of 30 slices shown (3 of 3)]
[im 1/30]
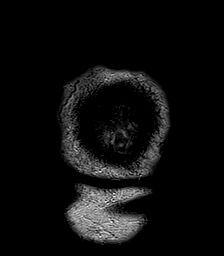
[im 15/30]
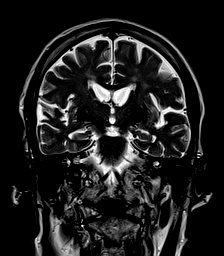
[im 30/30]
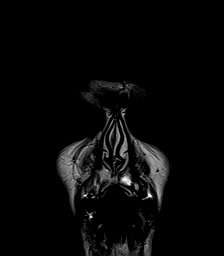

[42 of 48 positions shown; findings below may reference images not displayed]

FINDINGS: Brain: No diffusion-weighted signal abnormality to suggest acute
infarct. Mild diffuse parenchymal volume loss with ex vacuo
dilatation. Scattered periventricular and deep white matter T2/FLAIR
hyperintensities are nonspecific however commonly associated with
chronic microvascular ischemic changes. Sequela of remote right
corona radiata and left caudate body infarcts. Left frontal
superficial siderosis. No midline shift or extra-axial fluid
collection. No mass lesion.

Vascular: Normal flow voids.

Skull and upper cervical spine: Normal marrow signal.

Sinuses/Orbits: Sequela of bilateral lens replacement. Clear
paranasal sinuses. No mastoid effusion.

Other: None.
IMPRESSION: No acute intracranial process.  Left frontal superficial siderosis.

Remote right corona radiata and left caudate body infarcts.

Mild cerebral atrophy and chronic microvascular ischemic changes.

## 2020-04-24 IMAGING — MR MR CERVICAL SPINE W/O CM
5 series · 45 of 48 positions shown · non-contrast
Comparison: None.

CLINICAL DATA: Chronic neck pain, difficulty walking

EXAM:
MRI CERVICAL SPINE WITHOUT CONTRAST
TECHNIQUE: Multiplanar, multisequence MR imaging of the cervical spine was
performed. No intravenous contrast was administered.

[Series 9: T1 · sagittal · 3.0mm · 0.69mm/px · 5 of 13 slices shown]
[im 1/13]
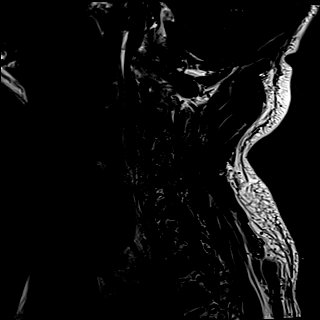
[im 4/13]
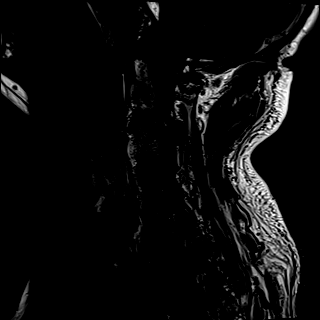
[im 7/13]
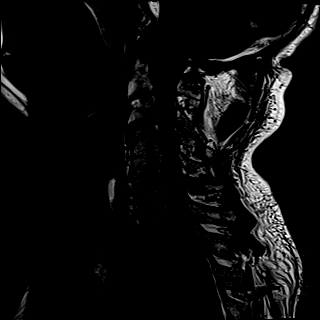
[im 10/13]
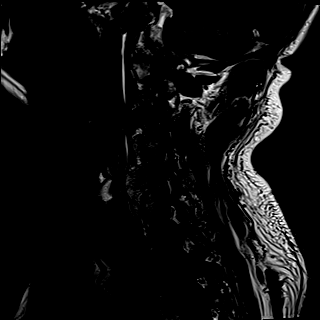
[im 13/13]
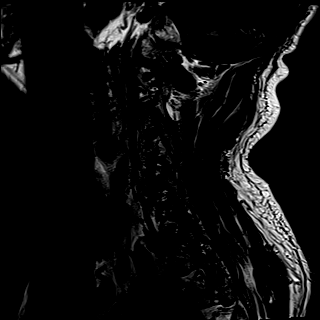

[Series 10: T2 · sagittal · 3.0mm · 0.69mm/px · 5 of 13 slices shown (1 of 2)]
[im 1/13]
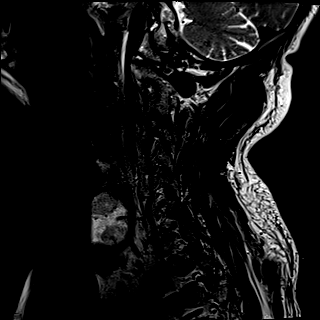
[im 4/13]
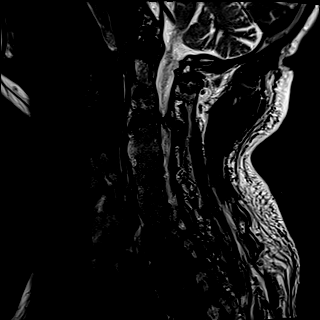
[im 7/13]
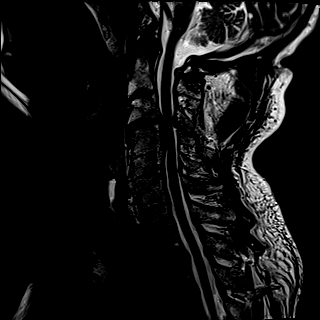
[im 10/13]
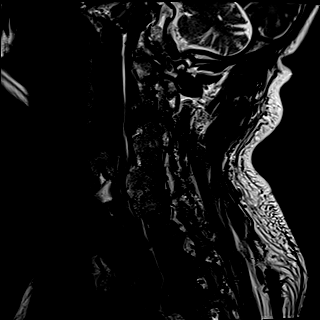
[im 13/13]
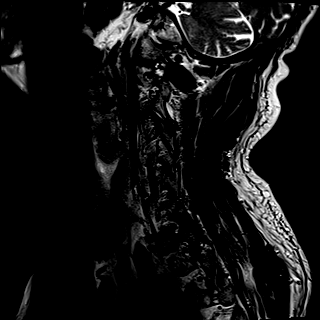

[Series 11: STIR · sagittal · 3.0mm · 0.86mm/px · 6 of 13 slices shown]
[im 1/13]
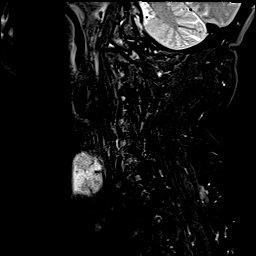
[im 3/13]
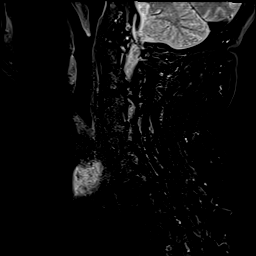
[im 5/13]
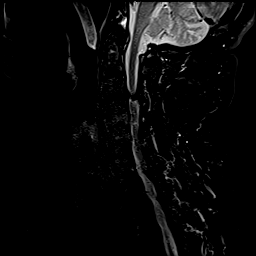
[im 8/13]
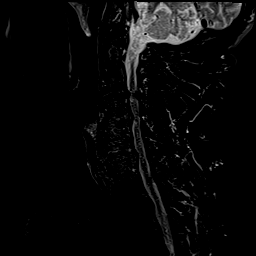
[im 10/13]
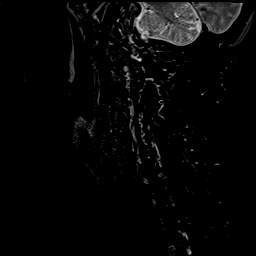
[im 13/13]
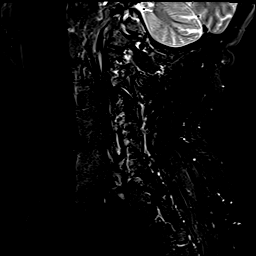

[Series 12: T2 · axial · 3.0mm · 0.70mm/px · z∈[-331,-212]mm · 16 of 36 slices shown (2 of 2)]
[im 1/36]
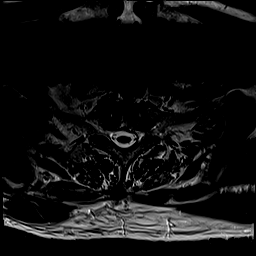
[im 3/36]
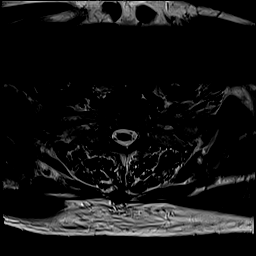
[im 5/36]
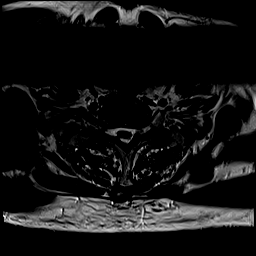
[im 8/36]
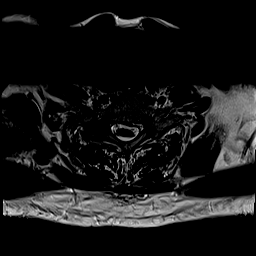
[im 10/36]
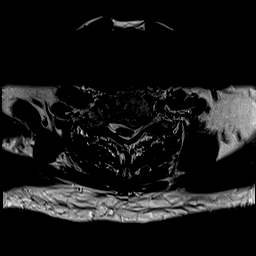
[im 12/36]
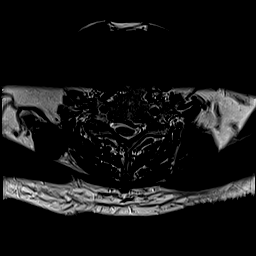
[im 15/36]
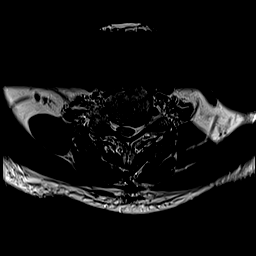
[im 17/36]
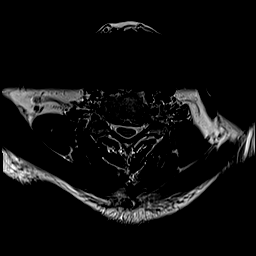
[im 19/36]
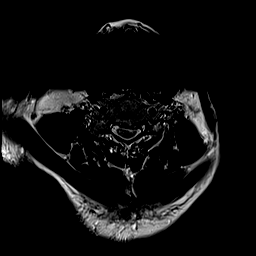
[im 22/36]
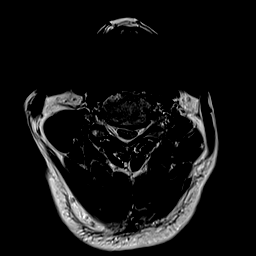
[im 24/36]
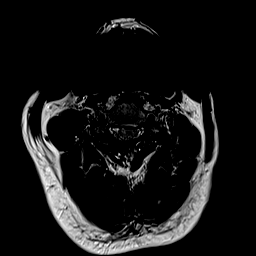
[im 26/36]
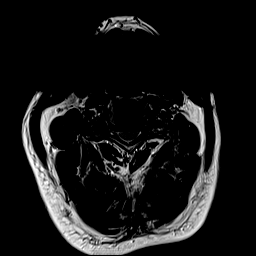
[im 29/36]
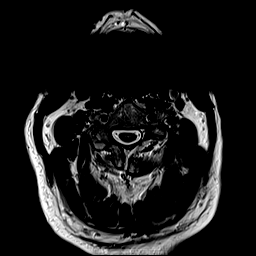
[im 31/36]
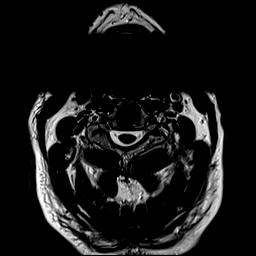
[im 33/36]
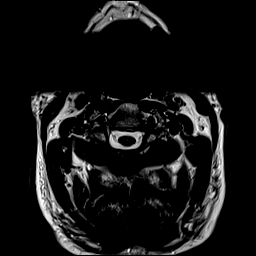
[im 36/36]
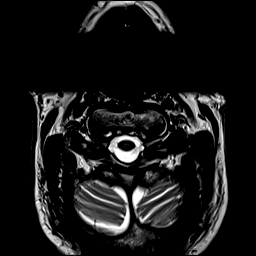

[Series 13: GRE · axial · 3.0mm · 0.47mm/px · z∈[-331,-212]mm · 13 of 36 slices shown]
[im 1/36]
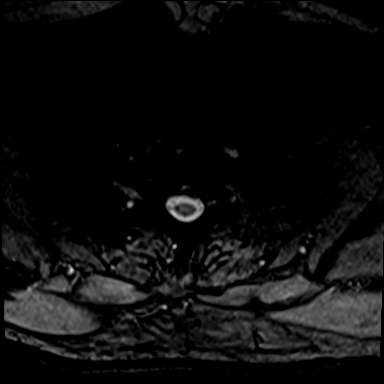
[im 3/36]
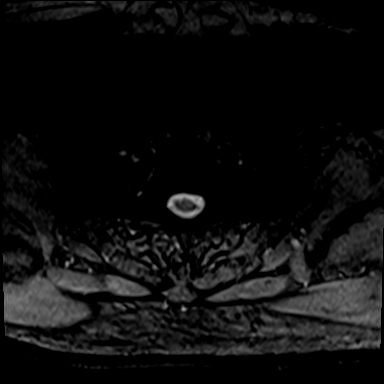
[im 5/36]
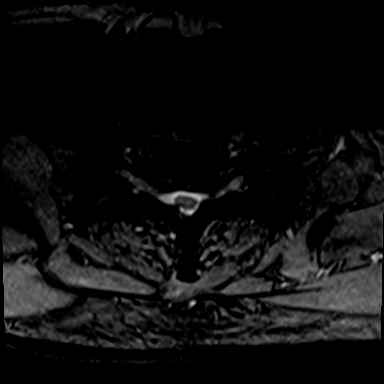
[im 8/36]
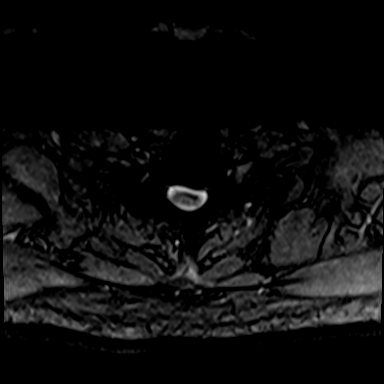
[im 10/36]
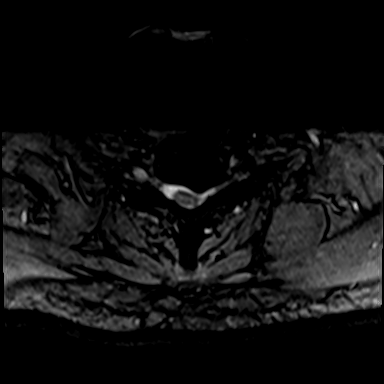
[im 12/36]
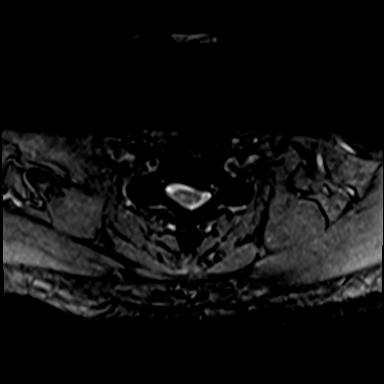
[im 15/36]
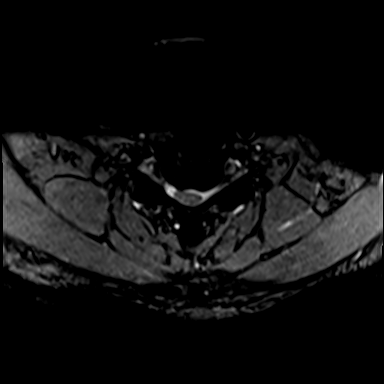
[im 17/36]
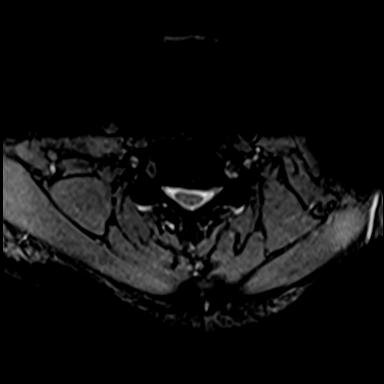
[im 19/36]
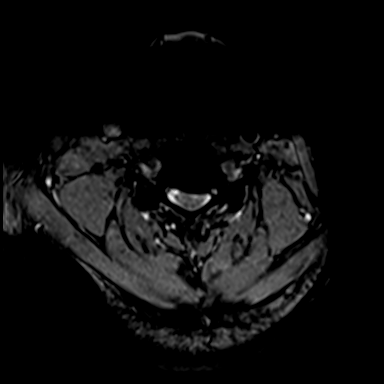
[im 22/36]
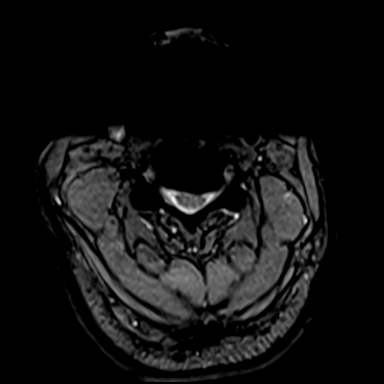
[im 26/36]
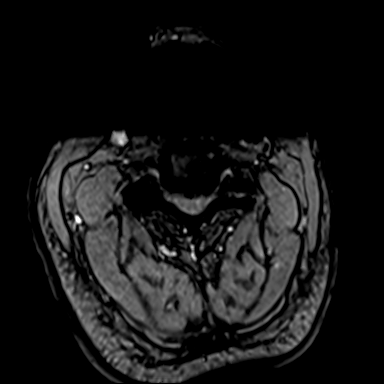
[im 31/36]
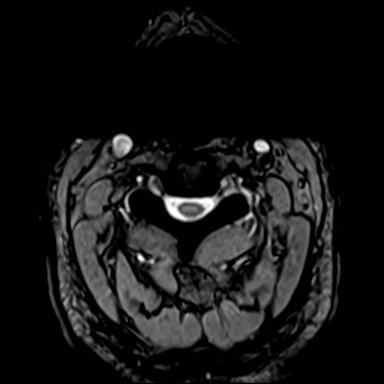
[im 36/36]
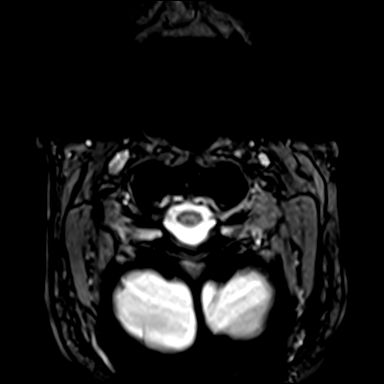

[45 of 48 positions shown; findings below may reference images not displayed]

FINDINGS: Alignment: No significant anteroposterior listhesis.

Vertebrae: No substantial marrow edema or suspicious osseous lesion.

Cord: No definite abnormal signal.

Posterior Fossa, vertebral arteries, paraspinal tissues: Partially
imaged right thyroid lobe nodule measuring 3.5 cm. Otherwise
unremarkable.

Disc levels:

C2-C3: Facet hypertrophy with fusion on the right. No significant
canal or foraminal stenosis.

C3-C4: Disc bulge with superimposed central disc protrusion,
endplate osteophytes, and facet and uncovertebral hypertrophy. There
is thickening and possible calcification along the ligamentum flavum
on the right. Marked canal stenosis. Moderate to marked foraminal
stenosis.

C4-C5: Partial fusion across the disc space with endplate
osteophytes and uncovertebral greater than facet hypertrophy. Mild
canal stenosis. Mild right and moderate left foraminal stenosis.

C5-C6: Partial fusion across the disc space with endplate
osteophytes and uncovertebral greater than facet hypertrophy. Mild
canal stenosis. No significant right foraminal stenosis. Minor left
foraminal stenosis.

C6-C7: Partial fusion across the disc space with endplate
osteophytes and uncovertebral hypertrophy eccentric to the left. No
significant canal stenosis. There is partial effacement the left
ventral subarachnoid space. No significant right foraminal stenosis.
Moderate left foraminal stenosis.

C7-T1: Disc bulge, endplate osteophytes, and facet hypertrophy. No
significant canal or foraminal stenosis.
IMPRESSION: Multilevel degenerative changes as detailed above. Most notably,
there is marked canal stenosis at C3-C4 without definite abnormal
cord signal.

3.5 cm right thyroid lobe nodule. Recommend thyroid ultrasound if
not previously performed. (Ref: [HOSPITAL]. [DATE]):

## 2020-04-24 IMAGING — CT CT HEAD W/O CM
3 series · 16 of 47 positions shown, 19 images · non-contrast
Comparison: [DATE]

CLINICAL DATA: Difficulty walking for several days

EXAM:
CT HEAD WITHOUT CONTRAST
TECHNIQUE: Contiguous axial images were obtained from the base of the skull
through the vertex without intravenous contrast.

[Series 2: head wo · axial · 0.47mm/px · z∈[-213,-88]mm · 10 of 31 slices shown, 13 images]
[im 3/31  brain]
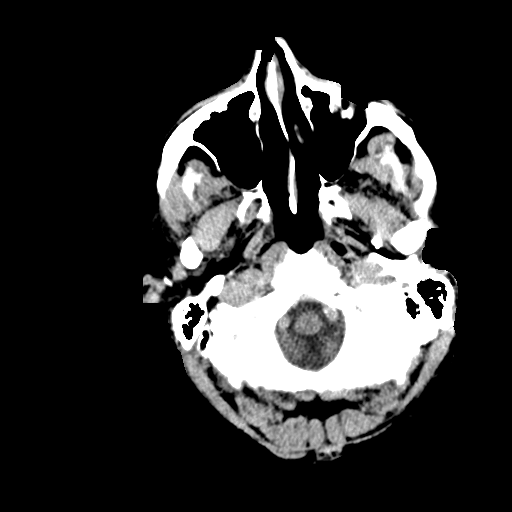
[im 3/31  bone]
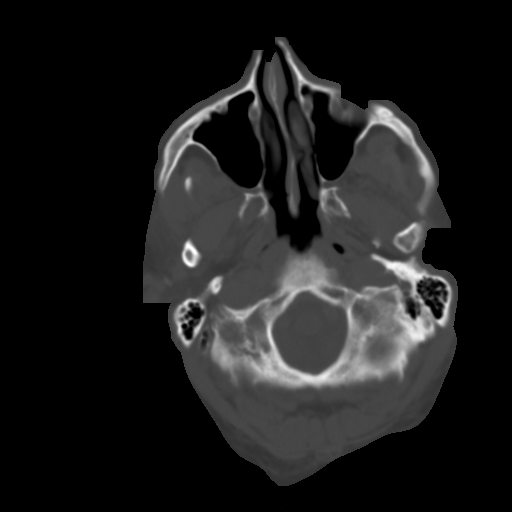
[im 6/31  brain]
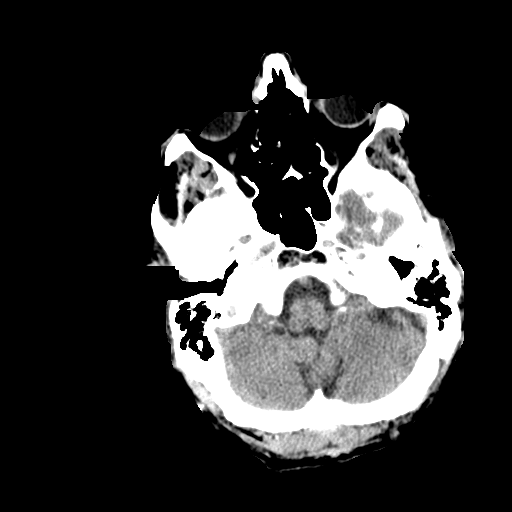
[im 9/31  brain]
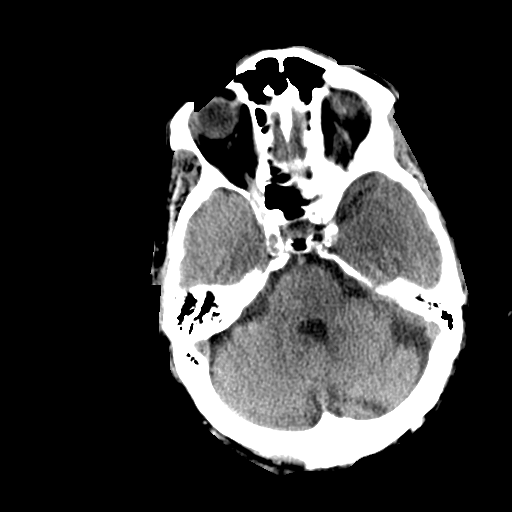
[im 11/31  brain]
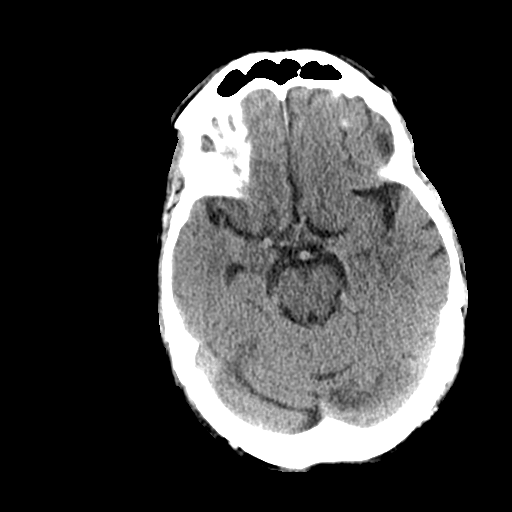
[im 14/31  brain]
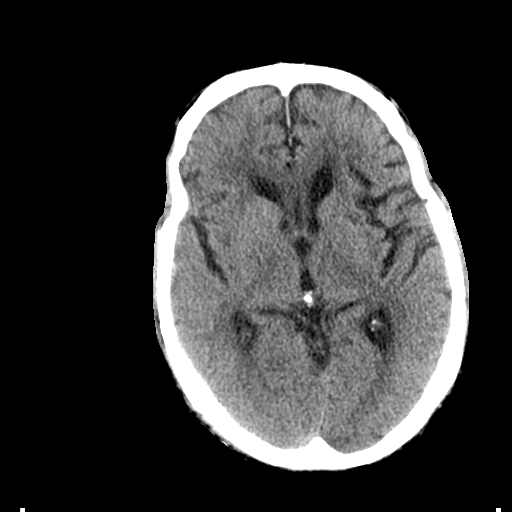
[im 14/31  bone]
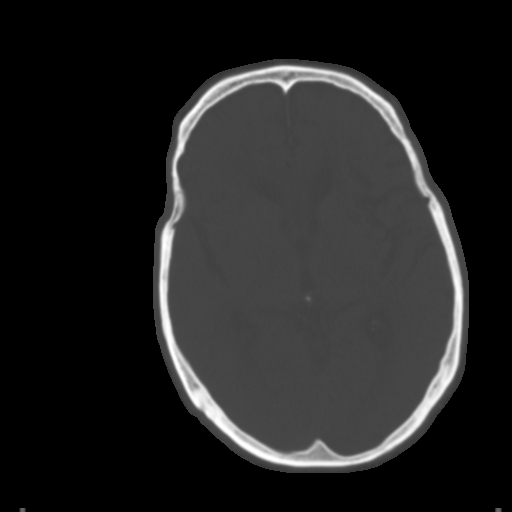
[im 17/31  brain]
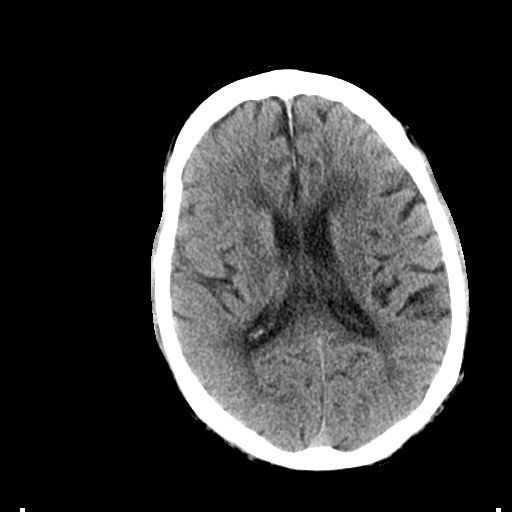
[im 20/31  brain]
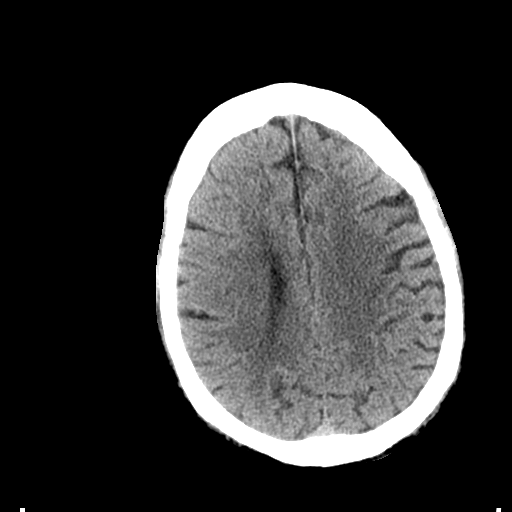
[im 23/31  brain]
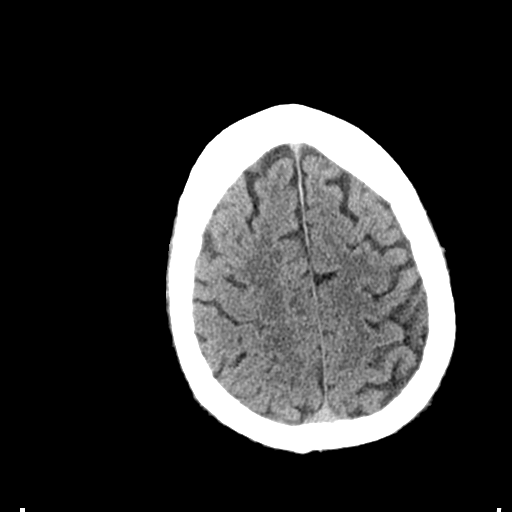
[im 25/31  brain]
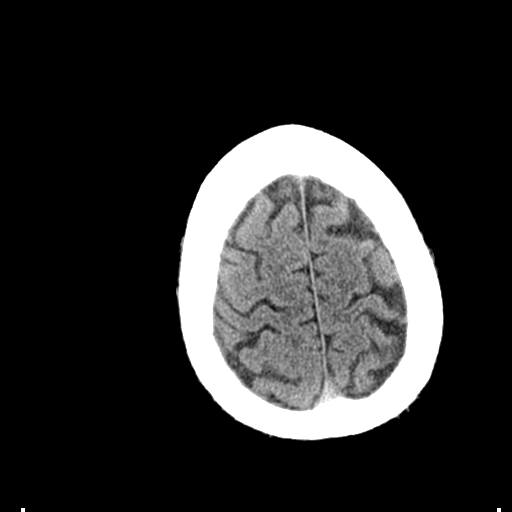
[im 25/31  bone]
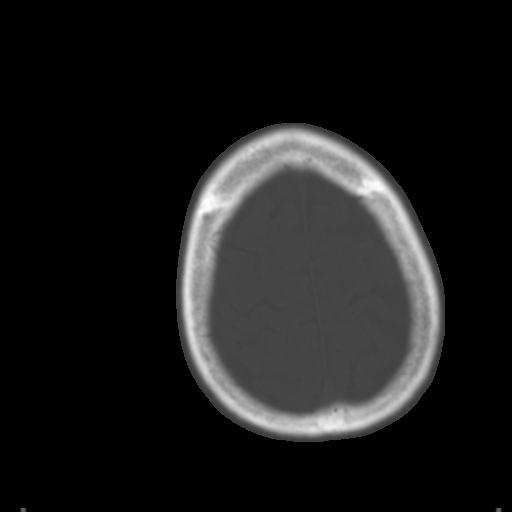
[im 28/31  brain]
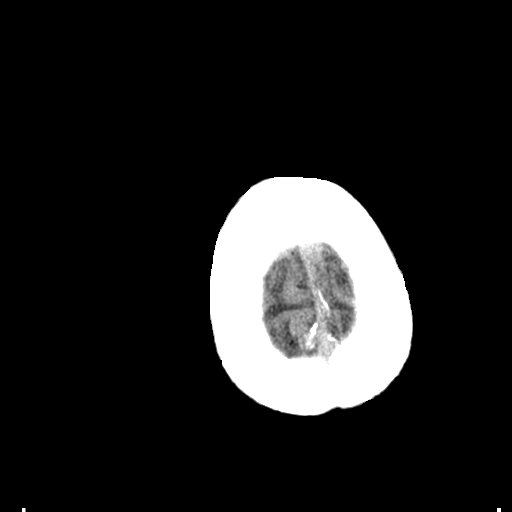

[Series 4: coronal soft tissue · coronal · 0.30mm/px · 3 of 71 slices shown]
[im 24/71  brain]
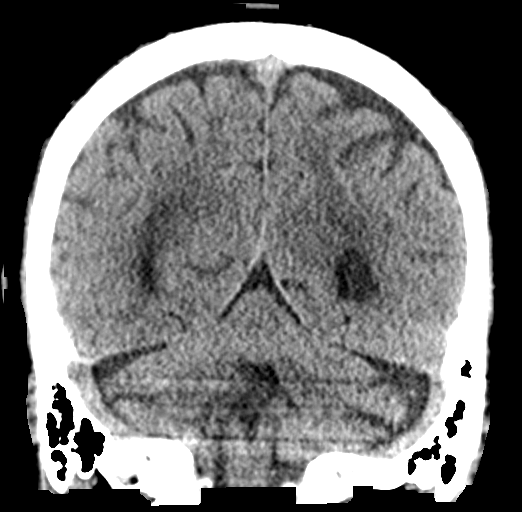
[im 32/71  brain]
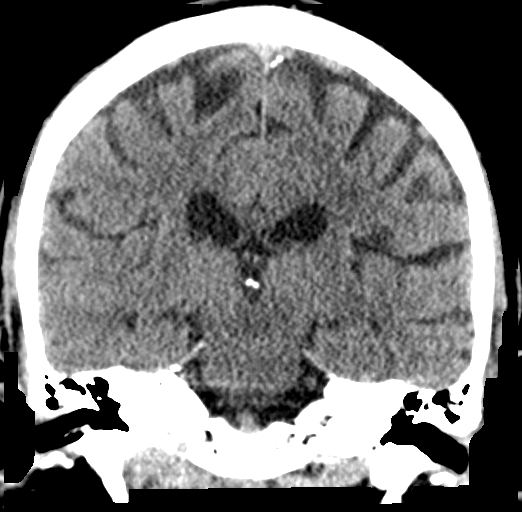
[im 39/71  brain]
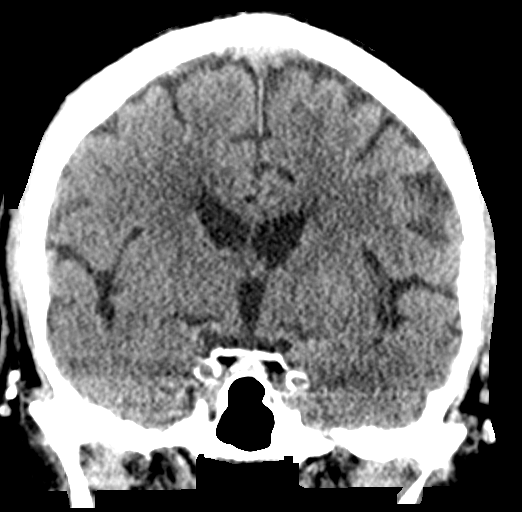

[Series 5: sagittal soft tissue · sagittal · 0.30mm/px · 3 of 50 slices shown]
[im 17/50  brain]
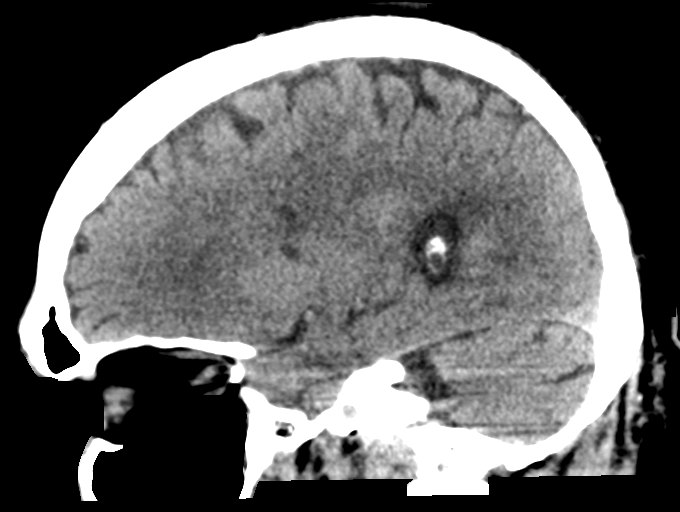
[im 25/50  brain]
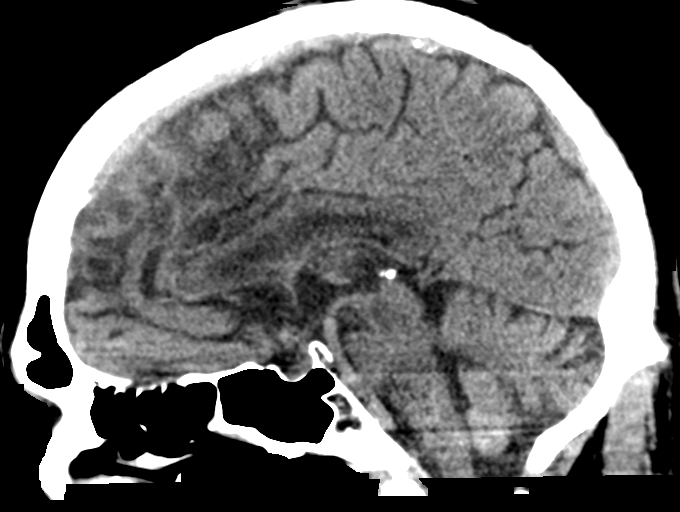
[im 33/50  brain]
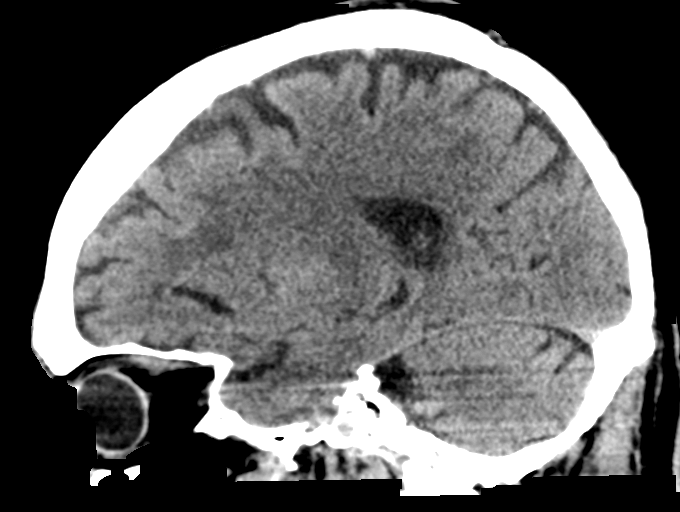

[16 of 47 positions shown; findings below may reference images not displayed]

FINDINGS: Brain: No evidence of acute infarction, hemorrhage, hydrocephalus,
extra-axial collection or mass lesion/mass effect. Mild atrophic and
ischemic changes are noted stable from prior exam.

Vascular: No hyperdense vessel or unexpected calcification.

Skull: Normal. Negative for fracture or focal lesion.

Sinuses/Orbits: No acute finding.

Other: None.
IMPRESSION: Chronic atrophic and ischemic changes without acute abnormality.

## 2020-04-24 IMAGING — MR MR THORACIC SPINE W/O CM
6 of 7 series · 43 of 48 positions shown · non-contrast
Comparison: None.

CLINICAL DATA: Back pain, difficulty walking

EXAM:
MRI THORACIC SPINE WITHOUT CONTRAST
TECHNIQUE: Multiplanar, multisequence MR imaging of the thoracic spine was
performed. No intravenous contrast was administered.

[Series 16: T1 · sagittal · 4.0mm · 1.72mm/px · 1 of 5 slices shown (1 of 2)]
[im 1/5]
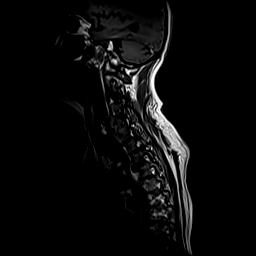

[Series 17: STIR · sagittal · 3.0mm · 1.06mm/px · 5 of 15 slices shown]
[im 1/15]
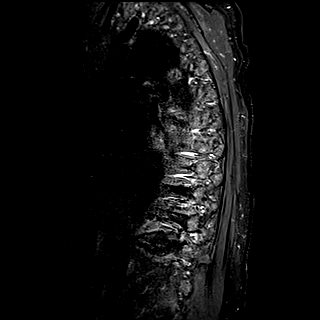
[im 4/15]
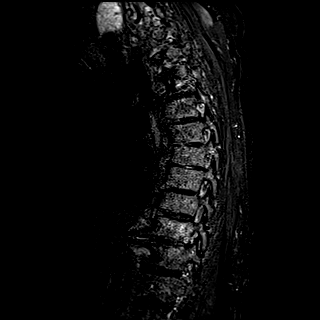
[im 8/15]
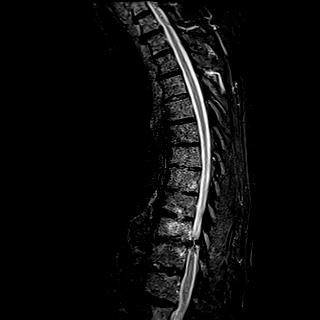
[im 11/15]
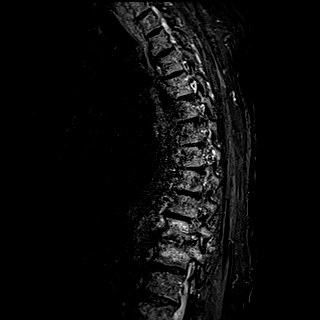
[im 15/15]
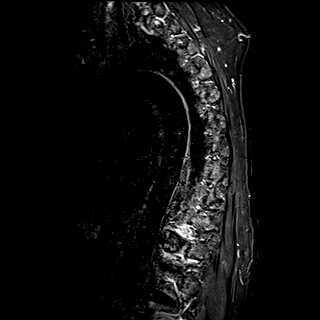

[Series 18: T1 · sagittal · 3.0mm · 1.06mm/px · 5 of 15 slices shown (2 of 2)]
[im 1/15]
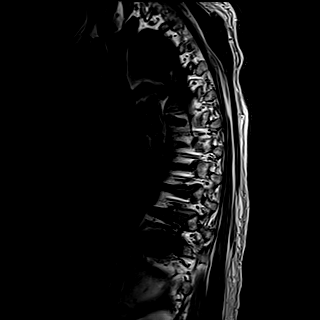
[im 4/15]
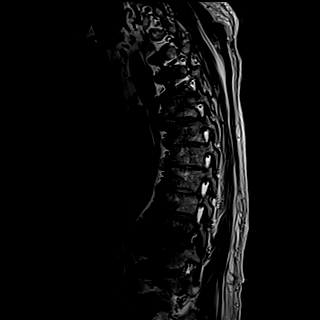
[im 8/15]
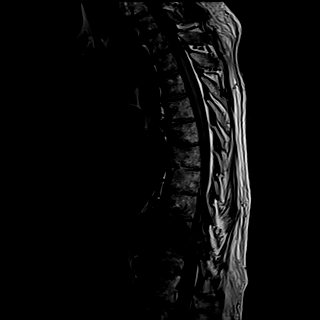
[im 11/15]
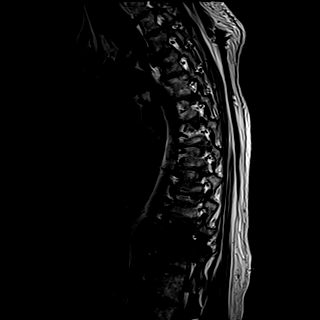
[im 15/15]
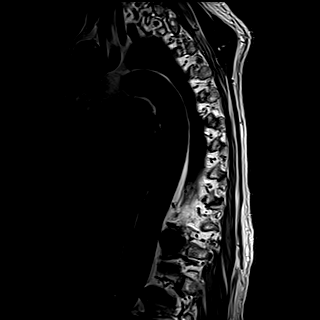

[Series 19: T2 · sagittal · 3.0mm · 0.89mm/px · 5 of 15 slices shown (1 of 2)]
[im 1/15]
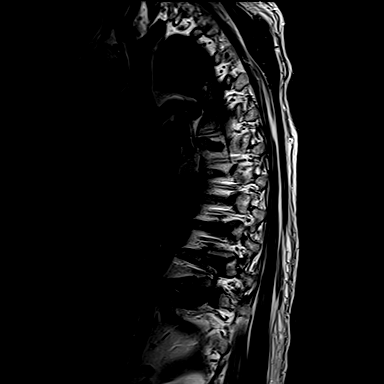
[im 4/15]
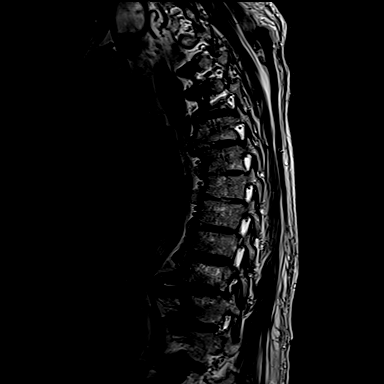
[im 8/15]
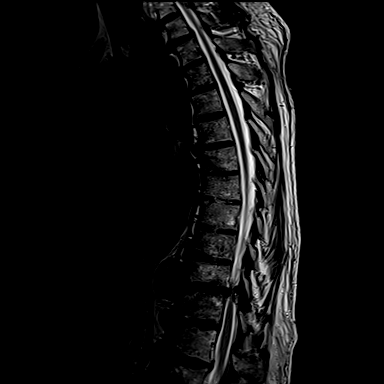
[im 11/15]
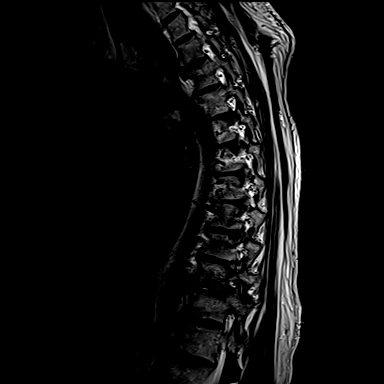
[im 15/15]
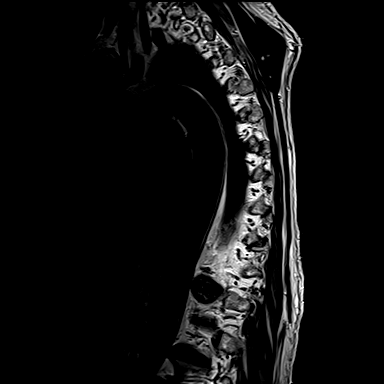

[Series 20: T2 · axial · 4.0mm · 0.78mm/px · z∈[-314,-55]mm · 14 of 44 slices shown (2 of 2)]
[im 1/44]
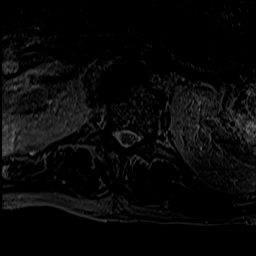
[im 4/44]
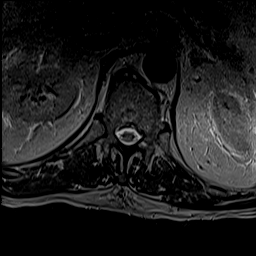
[im 7/44]
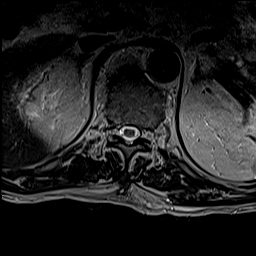
[im 10/44]
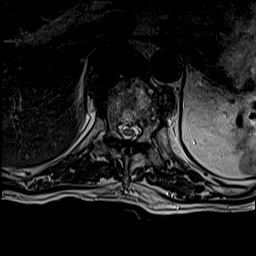
[im 14/44]
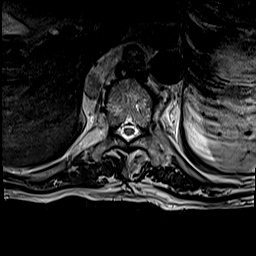
[im 17/44]
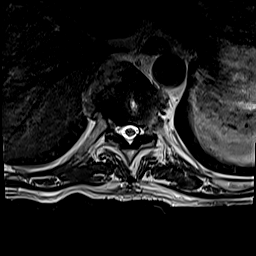
[im 20/44]
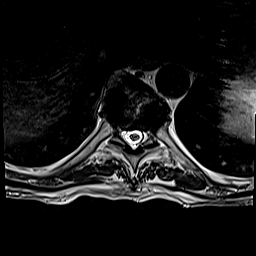
[im 24/44]
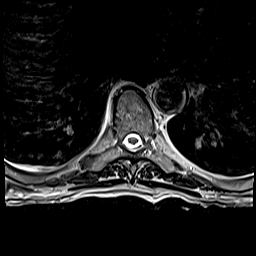
[im 27/44]
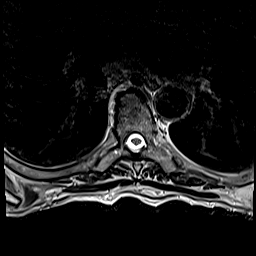
[im 30/44]
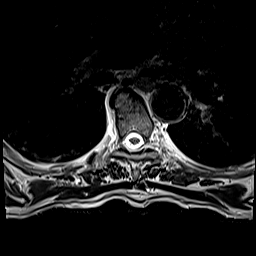
[im 34/44]
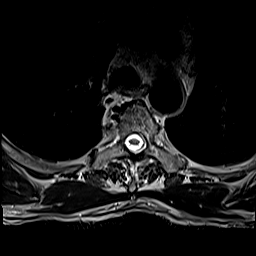
[im 37/44]
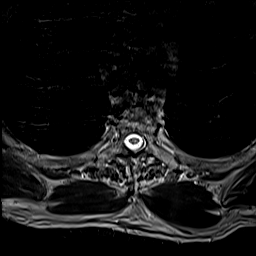
[im 40/44]
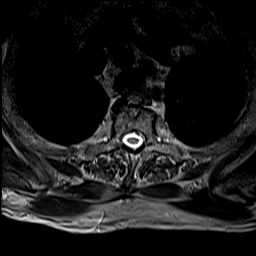
[im 44/44]
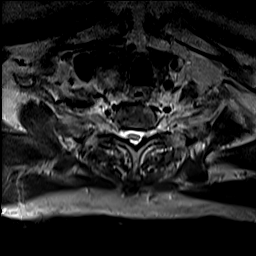

[Series 21: t2_me2d_tra · axial · 4.0mm · 0.52mm/px · z∈[-314,-55]mm · 13 of 44 slices shown]
[im 1/44]
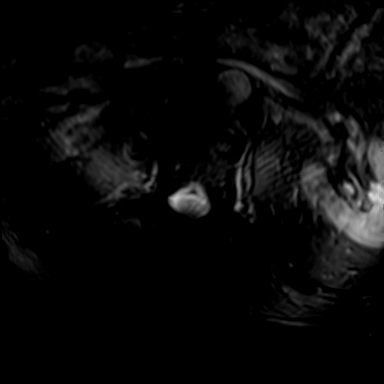
[im 4/44]
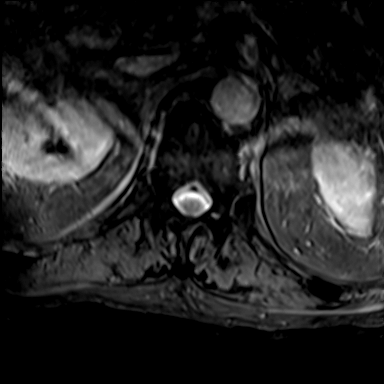
[im 7/44]
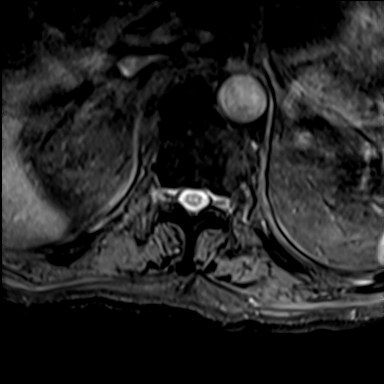
[im 10/44]
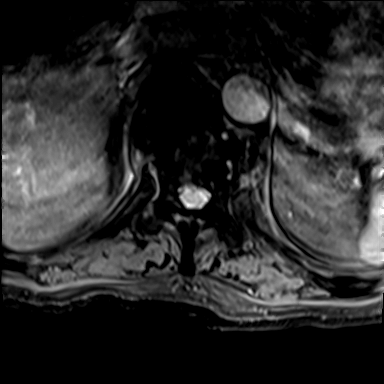
[im 14/44]
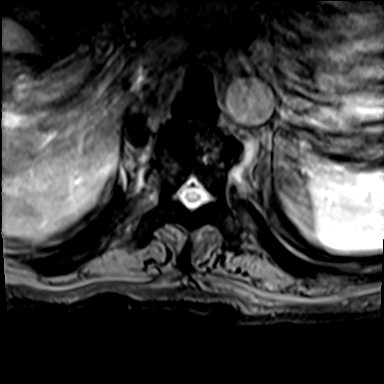
[im 17/44]
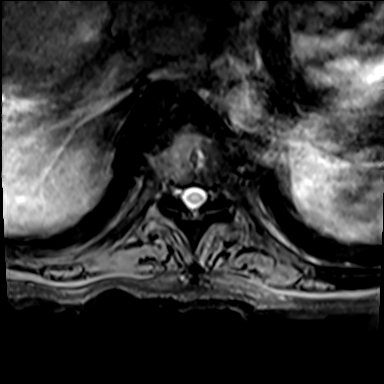
[im 20/44]
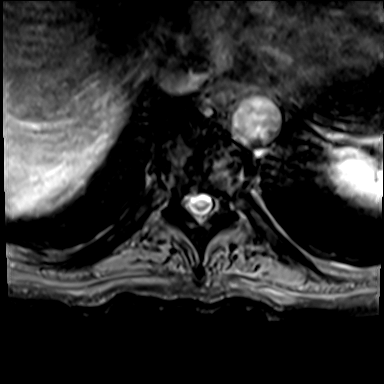
[im 24/44]
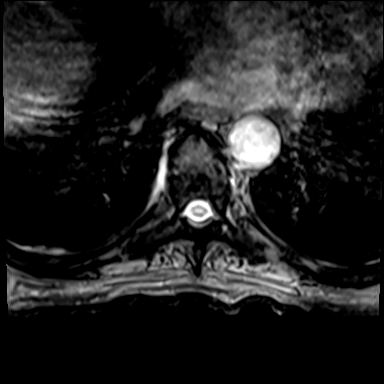
[im 27/44]
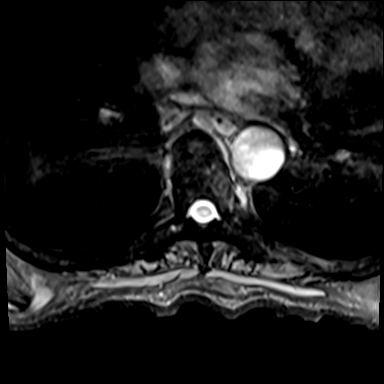
[im 30/44]
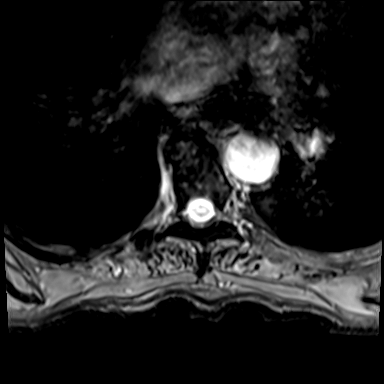
[im 34/44]
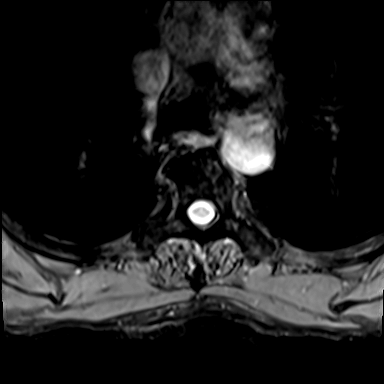
[im 37/44]
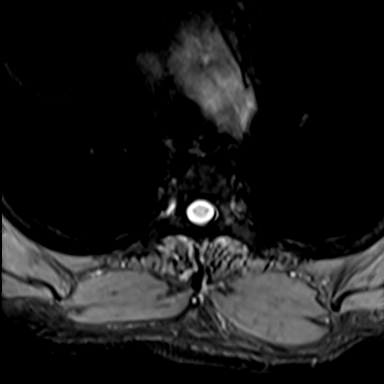
[im 44/44]
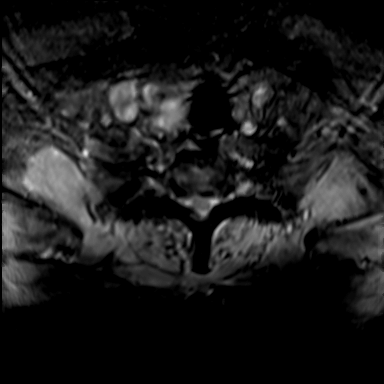

[43 of 48 positions shown; findings below may reference images not displayed]

FINDINGS: Motion artifact is present.

Alignment: Preserved thoracic kyphosis and anterior posterior
alignment.

Vertebrae: Vertebral body heights are preserved apart from
degenerative endplate irregularity, greatest at T10-T11. There large
bridging anterolateral endplate osteophytes, particularly at
T10-T11. Degenerative endplate marrow edema is present at T10-T11.
There is also degenerative edema associated with the left facets at
this level.

Cord:  There is cord T2 hyperintensity at T10-T11.

Paraspinal and other soft tissues: Unremarkable.

Disc levels: There is mild multilevel degenerative disc disease and
facet arthropathy. For example, small left paracentral disc
protrusion at T1-T2 and small disc bulge and right paracentral disc
protrusion at T8-T9. More significantly, at T10-T11, there is a disc
bulge eccentric to the left, endplate osteophytes, and facet
arthropathy with hypertrophic changes and ligamentum flavum
thickening. This results in severe canal stenosis with cord
compression. Mild to moderate foraminal stenosis.
IMPRESSION: Degenerative disc disease and facet degeneration at T10-T11
resulting in severe canal stenosis and cord compression with
abnormal cord signal reflecting edema or myelomalacia.

These results were called by telephone at the time of interpretation
on [DATE] at [DATE] to provider Dr. END, who verbally
acknowledged these results.

## 2020-04-24 IMAGING — MR MR LUMBAR SPINE W/O CM
4 of 5 series · 30 of 48 positions shown · non-contrast
Comparison: None.

CLINICAL DATA: Low back pain.  Difficulty walking

EXAM:
MRI LUMBAR SPINE WITHOUT CONTRAST
TECHNIQUE: Multiplanar, multisequence MR imaging of the lumbar spine was
performed. No intravenous contrast was administered.

[Series 9: T1 · sagittal · 4.0mm · 0.81mm/px · 6 of 15 slices shown (1 of 2)]
[im 1/15]
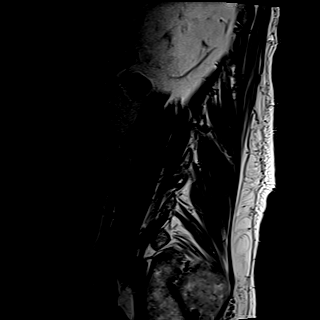
[im 3/15]
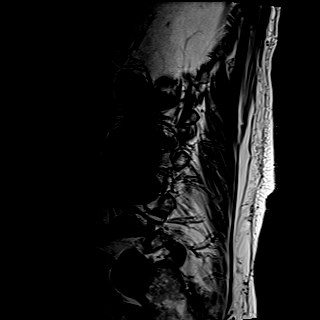
[im 6/15]
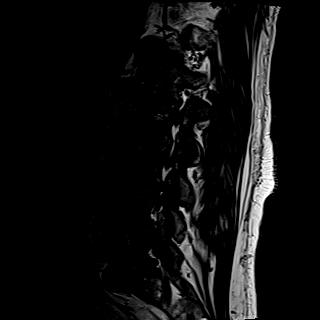
[im 9/15]
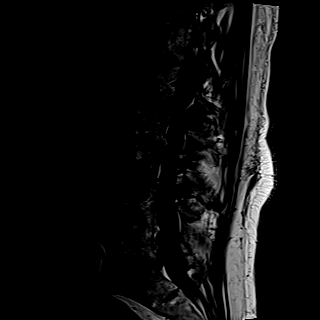
[im 12/15]
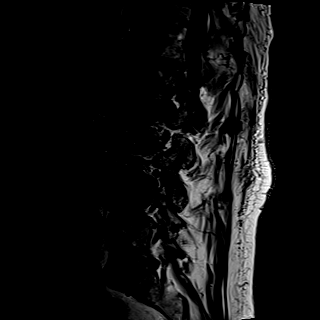
[im 15/15]
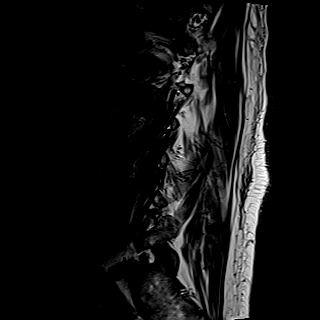

[Series 10: T2 · sagittal · 4.0mm · 0.81mm/px · 6 of 15 slices shown (1 of 2)]
[im 1/15]
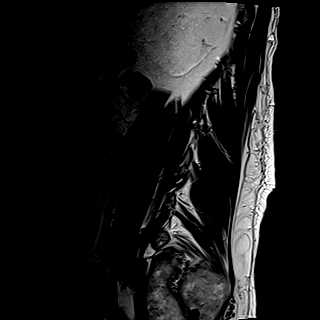
[im 3/15]
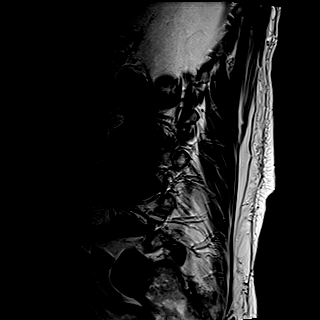
[im 6/15]
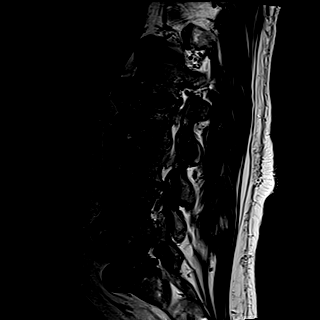
[im 9/15]
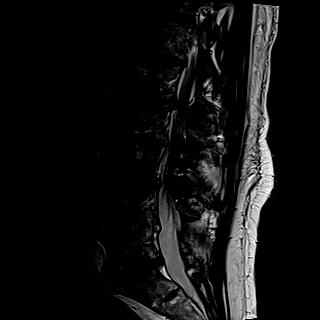
[im 12/15]
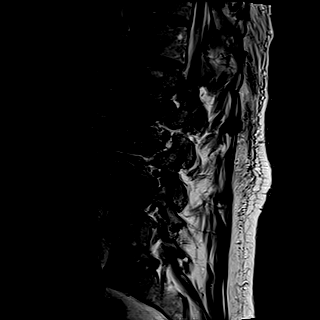
[im 15/15]
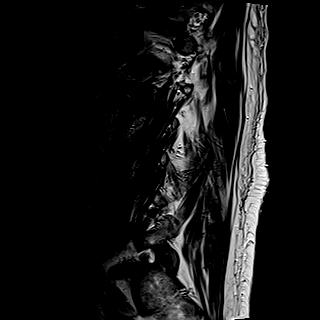

[Series 12: T2 · axial · 4.0mm · 0.62mm/px · z∈[-342,-129]mm · 9 of 41 slices shown (2 of 2)]
[im 1/41]
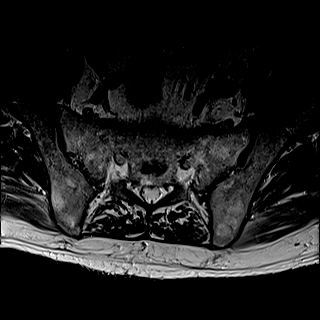
[im 6/41]
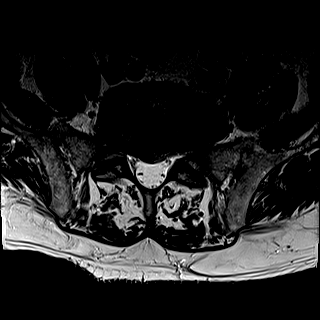
[im 12/41]
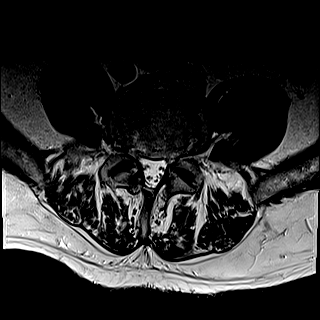
[im 18/41]
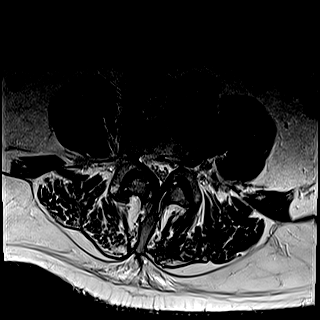
[im 21/41]
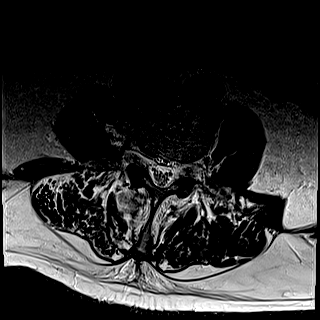
[im 23/41]
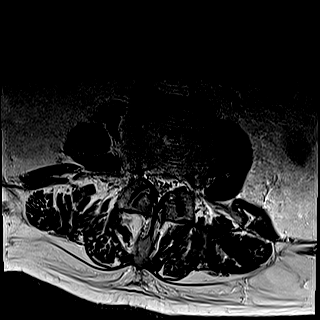
[im 29/41]
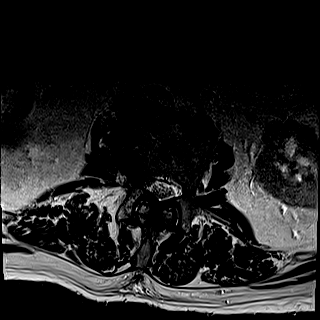
[im 35/41]
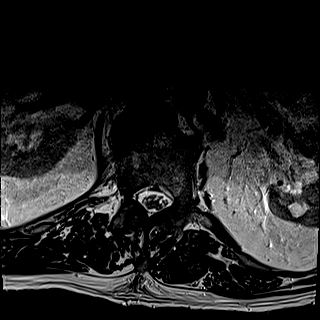
[im 41/41]
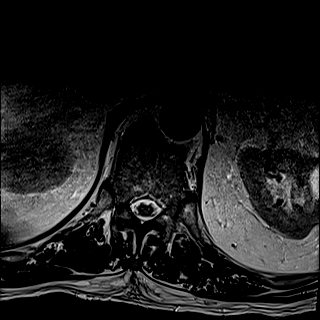

[Series 13: T1 · axial · 4.0mm · 0.43mm/px · z∈[-341,-128]mm · 9 of 41 slices shown (2 of 2)]
[im 1/41]
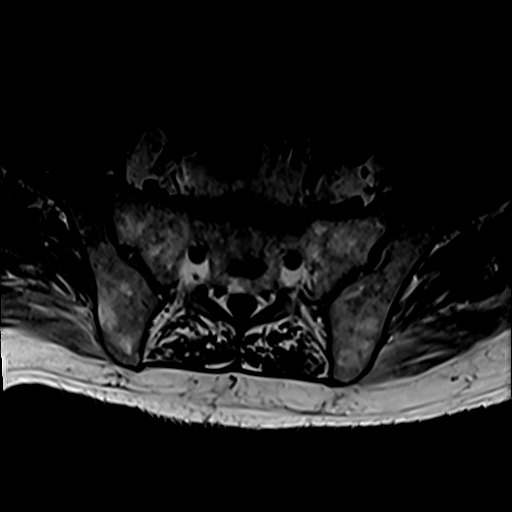
[im 6/41]
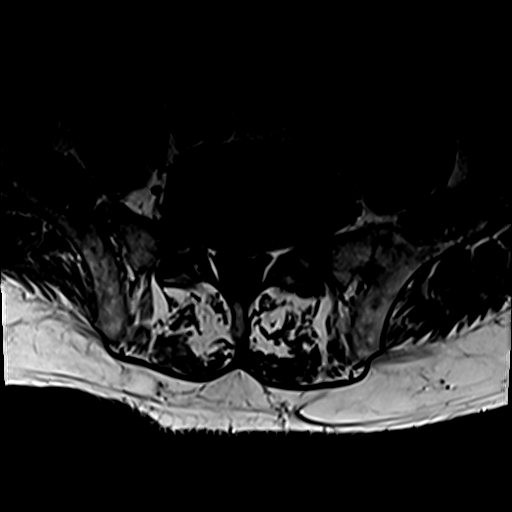
[im 12/41]
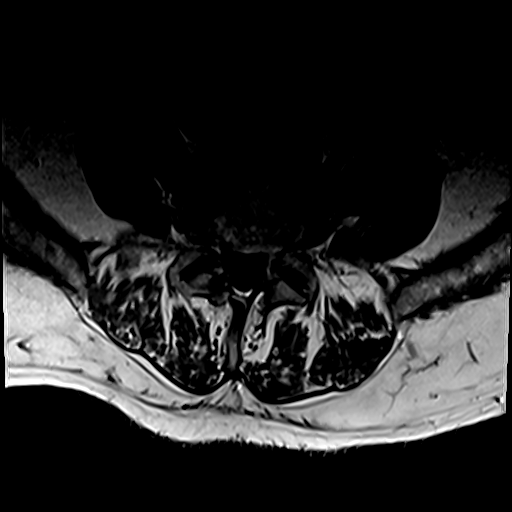
[im 18/41]
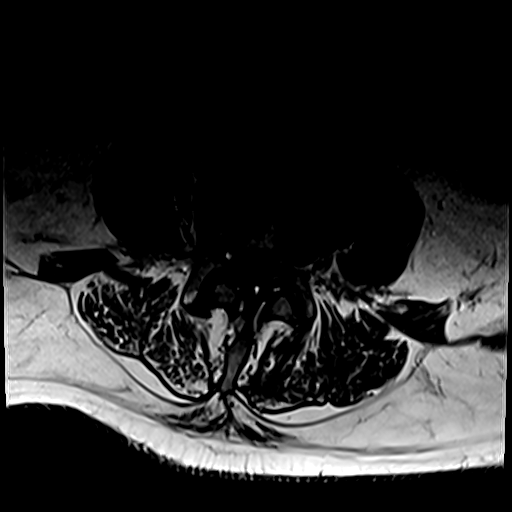
[im 21/41]
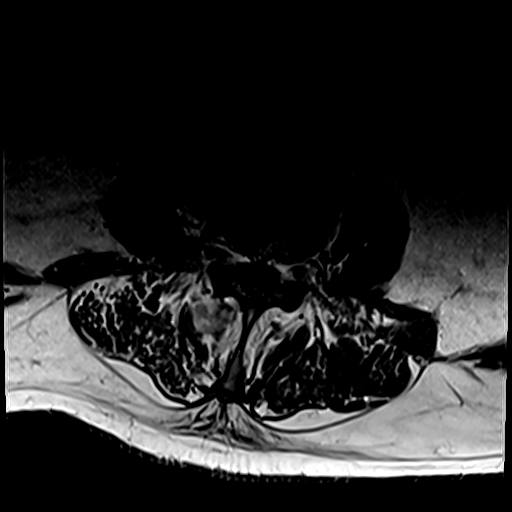
[im 23/41]
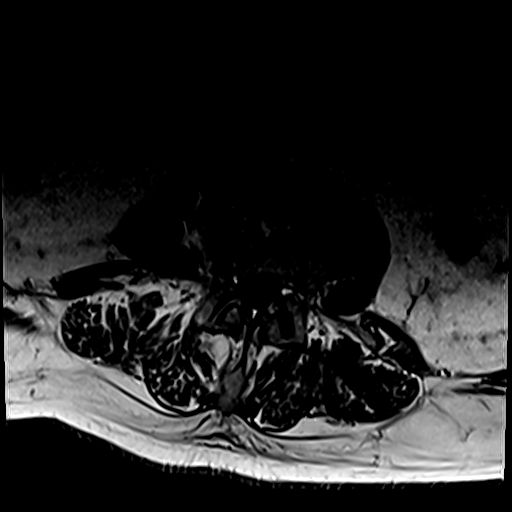
[im 29/41]
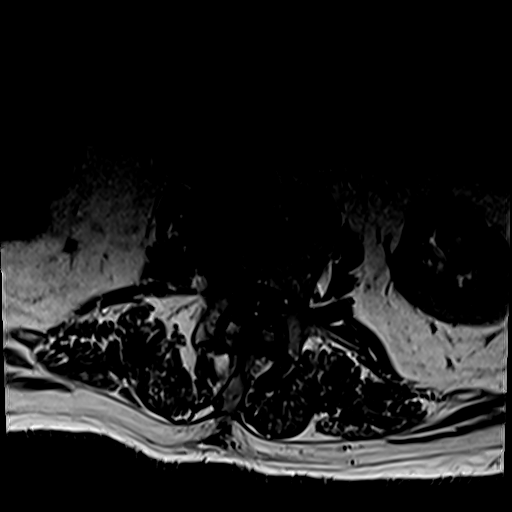
[im 35/41]
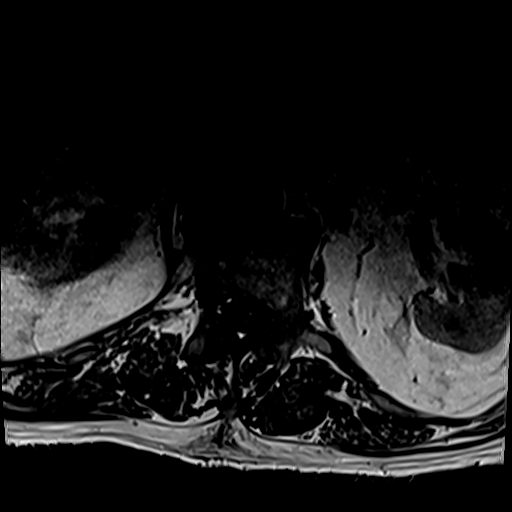
[im 41/41]
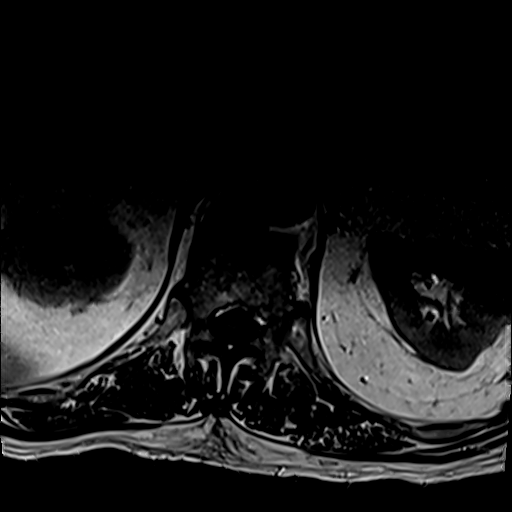

[30 of 48 positions shown; findings below may reference images not displayed]

FINDINGS: Segmentation: Presumed standard anatomy with the inferior-most well
developed disc space designated as L5-S1.

Alignment:  Trace retrolisthesis L5 on S1.  Mild levocurvature.

Vertebrae: No acute fracture, discitis, or suspicious bone lesion.
The L2-3 and L4-5 levels appear chronically fused. Discogenic
endplate marrow changes at L1-2 and L3-4. Mild diffuse marrow
heterogeneity without discrete marrow replacing lesion. Findings are
nonspecific but can be seen in the setting of chronic anemia,
smoking, and/or obesity.

Conus medullaris and cauda equina: Conus extends to the L1 level.
Conus and cauda equina appear normal.

Paraspinal and other soft tissues: Negative.

Disc levels:

T12-L1: Mild bilateral facet arthropathy. No disc protrusion. No
foraminal or canal stenosis.

L1-L2: Disc height loss with mild diffuse disc bulge and
degenerative endplate spurring. Right worse than left facet
arthropathy and ligamentum flavum buckling result in severe canal
stenosis with moderate right-sided foraminal stenosis.

L2-L3: Fused disc space. Mild bilateral facet arthropathy resulting
in mild-to-moderate canal stenosis. Prominent left extraforaminal
endplate spurring slightly displaces the exited left L2 nerve root.
No foraminal stenosis.

L3-L4: Disc height loss with mild diffuse disc bulge and
degenerative endplate spurring. Moderate bilateral facet arthropathy
and ligamentum flavum buckling results in mild-to-moderate canal
stenosis. Mild-to-moderate right and mild left foraminal stenosis.

L4-L5: Fused disc space. Bilateral extraforaminal spurring laterally
deviate the exited L4 nerve roots. No foraminal or canal stenosis.

L5-S1: Disc height loss with endplate spurring and shallow central
disc protrusion. Moderate left and mild right foraminal stenosis. No
canal stenosis.
IMPRESSION: 1. Multilevel degenerative changes of the lumbar spine greatest at
L1-2 where there is severe canal stenosis and moderate right
foraminal stenosis.
2. Mild-to-moderate canal stenosis at L2-3 and L3-4.
3. Mild diffuse marrow heterogeneity without discrete marrow
replacing lesion. Findings are nonspecific but can be seen in the
setting of chronic anemia, smoking, and/or obesity.

## 2020-04-24 MED ORDER — AMLODIPINE BESYLATE 5 MG PO TABS
5.0000 mg | ORAL_TABLET | Freq: Every day | ORAL | Status: DC
  Administered 2020-04-24 – 2020-04-26 (×3): 5 mg via ORAL
  Filled 2020-04-24 (×3): qty 1

## 2020-04-24 MED ORDER — INSULIN ASPART 100 UNIT/ML ~~LOC~~ SOLN
0.0000 [IU] | Freq: Three times a day (TID) | SUBCUTANEOUS | Status: DC
  Administered 2020-04-24 (×2): 3 [IU] via SUBCUTANEOUS
  Administered 2020-04-25 (×2): 5 [IU] via SUBCUTANEOUS
  Administered 2020-04-25: 7 [IU] via SUBCUTANEOUS
  Filled 2020-04-24: qty 0.09

## 2020-04-24 MED ORDER — METOPROLOL SUCCINATE ER 50 MG PO TB24
50.0000 mg | ORAL_TABLET | Freq: Every day | ORAL | Status: DC
  Administered 2020-04-24 – 2020-04-27 (×3): 50 mg via ORAL
  Filled 2020-04-24 (×3): qty 1

## 2020-04-24 MED ORDER — IRBESARTAN 300 MG PO TABS
300.0000 mg | ORAL_TABLET | Freq: Every day | ORAL | Status: DC
  Administered 2020-04-24 – 2020-04-27 (×3): 300 mg via ORAL
  Filled 2020-04-24 (×4): qty 1

## 2020-04-24 MED ORDER — ONDANSETRON HCL 4 MG/2ML IJ SOLN: 4.0000 mg | Freq: Four times a day (QID) | INTRAMUSCULAR | Status: DC | PRN

## 2020-04-24 MED ORDER — INSULIN GLARGINE 100 UNIT/ML ~~LOC~~ SOLN
15.0000 [IU] | Freq: Every day | SUBCUTANEOUS | Status: DC
  Filled 2020-04-24: qty 0.15

## 2020-04-24 MED ORDER — ATORVASTATIN CALCIUM 10 MG PO TABS
10.0000 mg | ORAL_TABLET | Freq: Every day | ORAL | Status: DC
  Administered 2020-04-24 – 2020-04-25 (×2): 10 mg via ORAL
  Filled 2020-04-24 (×2): qty 1

## 2020-04-24 MED ORDER — BASAGLAR KWIKPEN 100 UNIT/ML ~~LOC~~ SOPN: 200.0000 [IU] | PEN_INJECTOR | Freq: Every day | SUBCUTANEOUS | Status: DC

## 2020-04-24 MED ORDER — OMEGA-3-ACID ETHYL ESTERS 1 G PO CAPS
1.0000 g | ORAL_CAPSULE | Freq: Every day | ORAL | Status: DC
  Administered 2020-04-25: 1 g via ORAL
  Filled 2020-04-24 (×2): qty 1

## 2020-04-24 MED ORDER — TRAZODONE HCL 50 MG PO TABS
25.0000 mg | ORAL_TABLET | Freq: Every evening | ORAL | Status: DC | PRN
  Administered 2020-04-24 – 2020-04-26 (×3): 25 mg via ORAL
  Filled 2020-04-24 (×3): qty 1

## 2020-04-24 MED ORDER — DEXAMETHASONE SODIUM PHOSPHATE 4 MG/ML IJ SOLN
4.0000 mg | Freq: Four times a day (QID) | INTRAMUSCULAR | Status: DC
  Administered 2020-04-24 – 2020-04-26 (×7): 4 mg via INTRAVENOUS
  Filled 2020-04-24 (×9): qty 1

## 2020-04-24 MED ORDER — INSULIN GLARGINE 100 UNIT/ML ~~LOC~~ SOLN
15.0000 [IU] | Freq: Once | SUBCUTANEOUS | Status: DC
  Filled 2020-04-24: qty 0.15

## 2020-04-24 MED ORDER — HYDRALAZINE HCL 20 MG/ML IJ SOLN: 10.0000 mg | Freq: Four times a day (QID) | INTRAMUSCULAR | Status: DC | PRN

## 2020-04-24 MED ORDER — ONDANSETRON HCL 4 MG PO TABS: 4.0000 mg | ORAL_TABLET | Freq: Four times a day (QID) | ORAL | Status: DC | PRN

## 2020-04-24 NOTE — H&P (Signed)
History and Physical    OAKLAN SERVEN [id number] DOB: [birth year] DOA: 04/23/2020  PCP: Nicoletta Dress, MD  Patient coming from: Home.  Chief Complaint: Right lower extremity weakness.  HPI: Johnathan Barker is a 79 y.o. male with history of hypertension diabetes mellitus type 2 hyperlipidemia who was recently started on Lantus insulin 2 months ago presents to the ER because of increasing weakness over the last 4 to 5 days.  Weakness is mostly in the right lower extremity.  Has difficulty ambulating.  Patient states over the last 2 months patient has been using walker to walk.  Denies any incontinence of urine tingling or numbness or any difficulty swallowing speaking or visual symptoms.  Has no weakness of the upper extremity.  ED Course: In the ER on exam patient has decreased strength in the right lower extremity when compared to the left.  CT head is unremarkable.  UA unremarkable.  Covid test is pending.  CBC shows WBC 11.7 blood glucose of 455.  ER physician discussed with on-call neurologist who recommended getting MRI brain and of the entire spine without contrast.  Also recheck B12 thiamine hemoglobin A1c and magnesium levels.  Patient has been admitted for further management.  Review of Systems: As per HPI, rest all negative.   Past Medical History:  Diagnosis Date  . Arthritis    knees  . Diabetes mellitus without complication ([place])    type II  . Dysrhythmia   . History of kidney stones    lithotripsy  . Hypertension     Past Surgical History:  Procedure Laterality Date  . EYE SURGERY Bilateral    cataracts removed= IOL  . TONSILLECTOMY    . 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 He has never used smokeless tobacco. He reports that he does not drink alcohol or use drugs.  Allergies  Allergen Reactions  . Penicillins Hives, Rash and Other (See Comments)    FEVER  PATIENT HAS HAD A PCN REACTION WITH IMMEDIATE RASH, FACIAL/TONGUE/THROAT SWELLING, SOB, OR LIGHTHEADEDNESS WITH HYPOTENSION:  #  #  #  YES  #  #  #   Has patient had a PCN reaction causing severe rash involving mucus membranes or skin necrosis: No Has patient had a PCN reaction that required hospitalization: No Has patient had a PCN reaction occurring within the last 10 years: No If all of the above answers are "NO", then may proceed with Cephalosporin use.     Family History  Problem Relation Age of Onset  . CAD Neg Hx   . Diabetes Mellitus II Neg Hx     Prior to Admission medications   Medication Sig Start Date End Date Taking? Authorizing Provider  amLODipine (NORVASC) 5 MG tablet Take 5 mg by mouth at bedtime.    Yes [provider]  atorvastatin (LIPITOR) 10 MG tablet Take 10 mg by mouth daily.   Yes [provider]  glimepiride (AMARYL) 4 MG tablet Take 4 mg by mouth 2 (two) times daily. Morning & evening 09/07/17  Yes [provider]  Insulin Glargine (BASAGLAR KWIKPEN) 100 UNIT/ML Inject 2  mLs into the skin at bedtime. 04/10/20  Yes [provider]  metoprolol succinate (TOPROL-XL) 50 MG 24 hr tablet Take 50 mg by mouth daily before breakfast. Take with or immediately following a meal.    Yes [provider]  olmesartan (BENICAR) 40 MG tablet Take 40 mg by mouth daily. 11/09/19  Yes [provider]  Omega-3 Fatty Acids (FISH OIL PO) Take 1 capsule by mouth daily.    Yes [provider]  aspirin EC 325 MG EC tablet Take 1 tablet (325 mg total) by mouth 2 (two) times daily. Patient not taking: Reported on 04/23/2020 09/14/18   Donia Ast, PA    methocarbamol (ROBAXIN) 500 MG tablet Take 1-2 tablets (500-1,000 mg total) by mouth every 6 (six) hours as needed for muscle spasms. Patient not taking: Reported on 04/23/2020 09/14/18   Donia Ast, PA  oxyCODONE (OXY IR/ROXICODONE) 5 MG immediate release tablet Take 1-2 tablets (5-10 mg total) by mouth every 6 (six) hours as needed for moderate pain (pain score 4-6). Patient not taking: Reported on 04/23/2020 09/14/18   Donia Ast, PA    Physical Exam: Constitutional: Moderately built and nourished. Vitals:   04/24/20 0215 04/24/20 0230 04/24/20 0245 04/24/20 0300  BP: (!) 172/96 (!) 163/93 (!) 165/89 (!) 162/89  Pulse: 93 87 83 83  Resp: (!) 23 (!) 24 (!) 21 (!) 21  Temp:      TempSrc:      SpO2: 95% 96% 96% 96%  Weight:      Height:       Eyes: Anicteric no pallor. ENMT: No discharge from the ears eyes nose or mouth. Neck: No mass felt.  No neck rigidity. Respiratory: No rhonchi or crepitations. Cardiovascular: S1-S2 heard. Abdomen: Soft nontender bowel sounds present. Musculoskeletal: No edema. Skin: No rash. Neurologic: Alert awake oriented time place and person.  Has 3 x 5 strength in right lower extremities with extremities are 5 x 5.  Pupils equal and reacting to light. Psychiatric: Appears normal.   Labs on Admission: I have personally reviewed following labs and imaging studies  CBC: Recent Labs  Lab 04/23/20 2302  WBC 11.7*  NEUTROABS 8.2*  HGB 13.2  HCT 39.1  MCV 89.9  PLT [id number]   Basic Metabolic Panel: Recent Labs  Lab 04/23/20 2302 04/24/20 0240  NA 139  --   K 3.9  --   CL 104  --   CO2 25  --   GLUCOSE 255*  --   BUN 18  --   CREATININE 0.85  --   CALCIUM 8.5*  --   MG  --  1.8   GFR: Estimated Creatinine Clearance: 87.2 mL/min (by C-G formula based on SCr of 0.85 mg/dL). Liver Function Tests: Recent Labs  Lab 04/23/20 2302  AST 20  ALT 19  ALKPHOS 91  BILITOT 1.5*  PROT 6.6  ALBUMIN 3.6   No results for  input(s): LIPASE, AMYLASE in the last 168 hours. No results for input(s): AMMONIA in the last 168 hours. Coagulation Profile: No results for input(s): INR, PROTIME in the last 168 hours. Cardiac Enzymes: No results for input(s): CKTOTAL, CKMB, CKMBINDEX, TROPONINI in the last 168 hours. BNP (last 3 results) No results for input(s): PROBNP in the last 8760 hours. HbA1C: No results for input(s): HGBA1C in the last 72 hours. CBG: Recent Labs  Lab 04/23/20 2242  GLUCAP 228*   Lipid Profile: No results for input(s): CHOL, HDL, LDLCALC, TRIG, CHOLHDL, LDLDIRECT  in the last 72 hours. Thyroid Function Tests: No results for input(s): TSH, T4TOTAL, FREET4, T3FREE, THYROIDAB in the last 72 hours. Anemia Panel: No results for input(s): VITAMINB12, FOLATE, FERRITIN, TIBC, IRON, RETICCTPCT in the last 72 hours. Urine analysis:    Component Value Date/Time   COLORURINE YELLOW 04/23/2020 2239   APPEARANCEUR CLEAR 04/23/2020 2239   LABSPEC 1.020 04/23/2020 2239   PHURINE 6.0 04/23/2020 2239   GLUCOSEU >=500 (A) 04/23/2020 2239   HGBUR NEGATIVE 04/23/2020 2239   BILIRUBINUR NEGATIVE 04/23/2020 2239   KETONESUR 20 (A) 04/23/2020 2239   PROTEINUR NEGATIVE 04/23/2020 2239   NITRITE NEGATIVE 04/23/2020 2239   LEUKOCYTESUR NEGATIVE 04/23/2020 2239   Sepsis Labs: @LABRCNTIP (procalcitonin:4,lacticidven:4) )No results found for this or any previous visit (from the past 240 hour(s)).   Radiological Exams on Admission: CT Head Wo Contrast  Result Date: 04/24/2020 CLINICAL DATA:  Difficulty walking for several days EXAM: CT HEAD WITHOUT CONTRAST TECHNIQUE: Contiguous axial images were obtained from the base of the skull through the vertex without intravenous contrast. COMPARISON:  08/30/2018 FINDINGS: Brain: No evidence of acute infarction, hemorrhage, hydrocephalus, extra-axial collection or mass lesion/mass effect. Mild atrophic and ischemic changes are noted stable from prior exam. Vascular: No  hyperdense vessel or unexpected calcification. Skull: Normal. Negative for fracture or focal lesion. Sinuses/Orbits: No acute finding. Other: None. IMPRESSION: Chronic atrophic and ischemic changes without acute abnormality. Electronically Signed   By: Inez Catalina M.D.   On: 04/24/2020 00:28     Assessment/Plan Principal Problem:   Weakness of right lower extremity Active Problems:   Essential hypertension   Diabetes mellitus type 2 in nonobese ([place])    1. Right lower extremity weakness with difficulty ambulating and ataxia for which a neurologist has been contacted by ER physician and requested getting MRI brain and entire spine.  Also check hemoglobin A1c B12 thiamine and magnesium levels.  Further recommendation based on these test results.  Physical therapy consult. 2. Diabetes mellitus type 2 uncontrolled recently started on Lantus insulin which will be continued.  Check hemoglobin A1c. 3. Hypertension on ARB amlodipine and beta-blockers. 4. Hyperlipidemia on statins.  EKG and Covid test are pending.  Given the significant weakness and difficulty ambulating patient will need at least 2 midnight stay in inpatient status.   DVT prophylaxis: SCDs for now until we get MRI findings. Code Status: Full code. Family Communication: Discussed with patient. Disposition Plan: To be determined. Consults called: ER physician discussed with neurologist. Admission status: Inpatient.   Johnathan Patience MD [facility] Pager [phone].  If 7PM-7AM, please contact night-coverage [email or web address] Password [email or web address]  04/24/2020, 4:01 AM

## 2020-04-24 NOTE — Progress Notes (Addendum)
Patient arrived from [facility]. Seen briefly. Denies any c/o.Wife at bedside. Vitals stable except for fluctuating SBP. Hydralazine PRN added. Able to move LE against gravity at rest but unable to stand/bear weight. Seen by Neurosurgery and planned for intervention on Thursday.  [facility] Hospitalist team will f/u in am

## 2020-04-24 NOTE — Progress Notes (Signed)
Inpatient Diabetes Program Recommendations  AACE/ADA: New Consensus Statement on Inpatient Glycemic Control (2015)  Target Ranges:  Prepandial:   less than 140 mg/dL      Peak postprandial:   less than 180 mg/dL (1-2 hours)      Critically ill patients:  140 - 180 mg/dL   Lab Results  Component Value Date   GLUCAP 209 (H) 04/24/2020   HGBA1C 7.5 (H) 04/24/2020    Review of Glycemic Control Results for Johnathan Barker, Johnathan Barker (MRN [id number]) as of 04/24/2020 12:04  Ref. Range 04/23/2020 22:42 04/24/2020 07:56  Glucose-Capillary Latest Ref Range: 70 - 99 mg/dL 228 (H) 209 (H)   Diabetes history: DM 2 Outpatient Diabetes medications: Basaglar 200 units qhs, Amaryl 4 mg bid Current orders for Inpatient glycemic control:  Basaglar 200 units qhs, Novolog 0-9 units tid  A1c 7.5% on 6/1  Inpatient Diabetes Program Recommendations:    Pt ordered Basaglar 200 units to be given tonight.   Consider d/cing current dose and use weight based dosing instead, Lantus 18 units qhs.  Thanks,  Tama Headings RN, MSN, BC-ADM Inpatient Diabetes Coordinator Team Pager [phone] (8a-5p)

## 2020-04-24 NOTE — ED Notes (Signed)
Hospital bed placed in pt's room. When he returns, staff will assist him into bed for comfort.

## 2020-04-24 NOTE — Plan of Care (Signed)

## 2020-04-24 NOTE — ED Notes (Signed)
Pt is a 2 person assist with using urinal

## 2020-04-24 NOTE — H&P (Signed)
Johnathan Barker is an 79 y.o. male.   HPI:  79 year old male presented to the [facility] ED after progressive weakness in his legs over the last 3 months. He states that he has been dealing with vertigo for 3-4 months now and has been walking with a walker with frequent falls. States that his right leg has given out from underneath him several times. The weakness in his legs have gotten progressively worse over the last week to the point where he does not feel safe walking. His legs "feel like noodles." He denies any pain or NT in his neck or back. Denies any change in his bowel or bladder habits.   Past Medical History:  Diagnosis Date  . Arthritis    knees  . Diabetes mellitus without complication ([place])    type II  . Dysrhythmia   . History of kidney stones    lithotripsy  . Hypertension   . Weakness of both legs 04/24/2020    Past Surgical History:  Procedure Laterality Date  . EYE SURGERY Bilateral    cataracts removed= IOL  . TONSILLECTOMY    . 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 TOTAL KNEE ARTHROPLASTY Left 09/13/2018   Procedure: LEFT TOTAL KNEE ARTHROPLASTY;  Surgeon: Vickey Huger, MD;  Location: [facility];  Service: Orthopedics;  Laterality: Left;  with block    Allergies  Allergen Reactions  . Penicillins Hives, Rash and Other (See Comments)    FEVER  PATIENT HAS HAD A PCN REACTION WITH IMMEDIATE RASH, FACIAL/TONGUE/THROAT SWELLING, SOB, OR LIGHTHEADEDNESS WITH HYPOTENSION:  #  #  #  YES  #  #  #   Has patient had a PCN reaction causing severe rash involving mucus membranes or skin necrosis: No Has patient had a PCN reaction that required hospitalization: No Has patient had a PCN reaction occurring within the last 10 years: No If all of the above answers are "NO", then may proceed with Cephalosporin use.     Social History   Tobacco Use   . Smoking status: Never Smoker  . Smokeless tobacco: Never Used  Substance Use Topics  . Alcohol use: No    Family History  Problem Relation Age of Onset  . CAD Neg Hx   . Diabetes Mellitus II Neg Hx      Review of Systems  Positive ROS: as above  All other systems have been reviewed and were otherwise negative with the exception of those mentioned in the HPI and as above.  Objective: Vital signs in last 24 hours: Temp:  [98 F (36.7 C)-98.8 F (37.1 C)] 98 F (36.7 C) (06/01 1413) Pulse Rate:  [41-120] 79 (06/01 1413) Resp:  [14-31] 18 (06/01 1413) BP: (149-195)/(82-146) 168/88 (06/01 1413) SpO2:  [95 %-100 %] 98 % (06/01 1413) Weight:  [92.3 kg-95.3 kg] 92.3 kg (06/01 1413)  General Appearance: Alert, cooperative, no distress, appears stated age Head: Normocephalic, without obvious abnormality, atraumatic Eyes: PERRL, conjunctiva/corneas clear, EOM's intact, fundi benign, both eyes      Lungs:  respirations unlabored Heart: Regular rate and rhythm Extremities: Extremities normal, atraumatic, no cyanosis or edema  NEUROLOGIC:   Mental status: A&O x4, no aphasia, good attention span, Memory and fund of knowledge Motor Exam - grossly normal, normal tone and bulk, spastic Sensory Exam - grossly normal Reflexes: symmetric,No Hoffman's, No clonus, hyperreflexic in  bilat patellar Coordination - grossly normal Gait - spastic Balance - not tested Cranial Nerves: I: smell Not tested  II: visual acuity  OS: na    OD: na  II: visual fields Full to confrontation  II: pupils Equal, round, reactive to light  III,VII: ptosis None  III,IV,VI: extraocular muscles  Full ROM  V: mastication   V: facial light touch sensation    V,VII: corneal reflex    VII: facial muscle function - upper    VII: facial muscle function - lower   VIII: hearing   IX: soft palate elevation    IX,X: gag reflex   XI: trapezius strength    XI: sternocleidomastoid strength   XI: neck flexion  strength    XII: tongue strength      Data Review Lab Results  Component Value Date   WBC 13.4 (H) 04/24/2020   HGB 13.7 04/24/2020   HCT 40.0 04/24/2020   MCV 89.5 04/24/2020   PLT 332 04/24/2020   Lab Results  Component Value Date   NA 140 04/24/2020   K 3.8 04/24/2020   CL 105 04/24/2020   CO2 27 04/24/2020   BUN 14 04/24/2020   CREATININE 0.81 04/24/2020   GLUCOSE 216 (H) 04/24/2020   No results found for: INR, PROTIME  Radiology: CT Head Wo Contrast  Result Date: 04/24/2020 CLINICAL DATA:  Difficulty walking for several days EXAM: CT HEAD WITHOUT CONTRAST TECHNIQUE: Contiguous axial images were obtained from the base of the skull through the vertex without intravenous contrast. COMPARISON:  08/30/2018 FINDINGS: Brain: No evidence of acute infarction, hemorrhage, hydrocephalus, extra-axial collection or mass lesion/mass effect. Mild atrophic and ischemic changes are noted stable from prior exam. Vascular: No hyperdense vessel or unexpected calcification. Skull: Normal. Negative for fracture or focal lesion. Sinuses/Orbits: No acute finding. Other: None. IMPRESSION: Chronic atrophic and ischemic changes without acute abnormality. Electronically Signed   By: Inez Catalina M.D.   On: 04/24/2020 00:28   MR BRAIN WO CONTRAST  Result Date: 04/24/2020 CLINICAL DATA:  Focal neurological deficit, stroke suspected. EXAM: MRI HEAD WITHOUT CONTRAST TECHNIQUE: Multiplanar, multiecho pulse sequences of the brain and surrounding structures were obtained without intravenous contrast. COMPARISON:  04/24/2020 CT head. FINDINGS: Brain: No diffusion-weighted signal abnormality to suggest acute infarct. Mild diffuse parenchymal volume loss with ex vacuo dilatation. Scattered periventricular and deep white matter T2/FLAIR hyperintensities are nonspecific however commonly associated with chronic microvascular ischemic changes. 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 Left  frontal superficial siderosis. No midline shift or extra-axial fluid collection. No mass lesion. Vascular: Normal flow voids. Skull and upper cervical spine: Normal marrow signal. Sinuses/Orbits: Sequela of bilateral lens replacement. Clear paranasal sinuses. No mastoid effusion. Other: None. IMPRESSION: No acute intracranial process.  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 Mild cerebral atrophy and chronic microvascular ischemic changes. Electronically Signed   By: Primitivo Gauze M.D.   On: 04/24/2020 09:16   MR CERVICAL SPINE WO CONTRAST  Result Date: 04/24/2020 CLINICAL DATA:  Chronic neck pain, difficulty walking EXAM: MRI CERVICAL SPINE WITHOUT CONTRAST TECHNIQUE: Multiplanar, multisequence MR imaging of the cervical spine was performed. No intravenous contrast was administered. COMPARISON:  None. FINDINGS: Alignment: No significant anteroposterior listhesis. Vertebrae: No substantial marrow edema or suspicious osseous lesion. Cord: No definite abnormal signal. Posterior Fossa, vertebral arteries, paraspinal tissues: Partially imaged right thyroid lobe nodule measuring 3.5 cm. Otherwise unremarkable. Disc levels: C2-C3: Facet hypertrophy with fusion on the  right. No significant canal or foraminal stenosis. C3-C4: Disc bulge with superimposed central disc protrusion, endplate osteophytes, and facet and uncovertebral hypertrophy. There is thickening and possible calcification along the ligamentum flavum on the right. Marked canal stenosis. Moderate to marked foraminal stenosis. C4-C5: Partial fusion across the disc space with endplate osteophytes and uncovertebral greater than facet hypertrophy. Mild canal stenosis. Mild right and moderate left foraminal stenosis. C5-C6: Partial fusion across the disc space with endplate osteophytes and uncovertebral greater than facet hypertrophy. Mild canal stenosis. No significant right foraminal stenosis. Minor  left foraminal stenosis. C6-C7: Partial fusion across the disc space with endplate osteophytes and uncovertebral hypertrophy eccentric to the left. No significant canal stenosis. There is partial effacement the left ventral subarachnoid space. No significant right foraminal stenosis. Moderate left foraminal stenosis. C7-T1: Disc bulge, endplate osteophytes, and facet hypertrophy. No significant canal or foraminal stenosis. IMPRESSION: Multilevel degenerative changes as detailed above. Most notably, there is marked canal stenosis at C3-C4 without definite abnormal cord signal. 3.5 cm right thyroid lobe nodule. Recommend thyroid ultrasound if not previously performed. (Ref: J Am Coll Radiol. 2015 Feb;12(2): 143-50). Electronically Signed   By: Macy Mis M.D.   On: 04/24/2020 09:36   MR THORACIC SPINE WO CONTRAST  Result Date: 04/24/2020 CLINICAL DATA:  Back pain, difficulty walking EXAM: MRI THORACIC SPINE WITHOUT CONTRAST TECHNIQUE: Multiplanar, multisequence MR imaging of the thoracic spine was performed. No intravenous contrast was administered. COMPARISON:  None. FINDINGS: Motion artifact is present. Alignment: Preserved thoracic kyphosis and anterior posterior alignment. Vertebrae: Vertebral body heights are preserved apart from degenerative endplate irregularity, greatest at T10-T11. There large bridging anterolateral endplate osteophytes, particularly at T10-T11. Degenerative endplate marrow edema is present at T10-T11. There is also degenerative edema associated with the left facets at this level. Cord:  There is cord T2 hyperintensity at T10-T11. Paraspinal and other soft tissues: Unremarkable. Disc levels: There is mild multilevel degenerative disc disease and facet arthropathy. For example, small left paracentral disc protrusion at T1-T2 and small disc bulge and right paracentral disc protrusion at T8-T9. More significantly, at T10-T11, there is a disc bulge eccentric to the left, endplate  osteophytes, and facet arthropathy with hypertrophic changes and ligamentum flavum thickening. This results in severe canal stenosis with cord compression. Mild to moderate foraminal stenosis. 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 These results were called by telephone at the time of interpretation on 04/24/2020 at 10:15 am to provider Dr. Teryl Lucy, who verbally acknowledged these results. Electronically Signed   By: Macy Mis M.D.   On: 04/24/2020 10:19   MR LUMBAR SPINE WO CONTRAST  Result Date: 04/24/2020 CLINICAL DATA:  Low back pain.  Difficulty walking EXAM: MRI LUMBAR SPINE WITHOUT CONTRAST TECHNIQUE: Multiplanar, multisequence MR imaging of the lumbar spine was performed. No intravenous contrast was administered. COMPARISON:  None. FINDINGS: Segmentation: Presumed standard anatomy with the inferior-most well developed disc space designated as L5-S1. Alignment:  Trace retrolisthesis L5 on S1.  Mild levocurvature. Vertebrae: No acute fracture, discitis, or suspicious bone lesion. The L2-3 and L4-5 levels appear chronically fused. Discogenic endplate marrow changes at L1-2 and L3-4. Mild diffuse marrow heterogeneity without discrete marrow replacing lesion. Findings are nonspecific but can be seen in the setting of chronic anemia, smoking, and/or obesity. Conus medullaris and cauda equina: Conus extends to the L1 level. Conus and cauda equina appear normal. Paraspinal and other soft tissues:  Negative. Disc levels: T12-L1: Mild bilateral facet arthropathy. No disc protrusion. No foraminal or canal stenosis. L1-L2: Disc height loss with mild diffuse disc bulge and degenerative endplate spurring. Right worse than left facet arthropathy and ligamentum flavum buckling result in severe canal stenosis with moderate right-sided foraminal stenosis. L2-L3: Fused disc  space. Mild bilateral facet arthropathy resulting in mild-to-moderate canal stenosis. Prominent left extraforaminal endplate spurring slightly displaces the exited left L2 nerve root. No foraminal stenosis. L3-L4: Disc height loss with mild diffuse disc bulge and degenerative endplate spurring. Moderate bilateral facet arthropathy and ligamentum flavum buckling results in mild-to-moderate canal stenosis. Mild-to-moderate right and mild left foraminal stenosis. L4-L5: Fused disc space. Bilateral extraforaminal spurring laterally deviate the exited L4 nerve roots. No foraminal or canal stenosis. L5-S1: Disc height loss with endplate spurring and shallow central disc protrusion. Moderate left and mild right foraminal stenosis. No canal stenosis. IMPRESSION: 1. Multilevel degenerative changes of the lumbar spine greatest at L1-2 where there is severe canal stenosis and moderate right foraminal stenosis. 2. Mild-to-moderate canal stenosis at L2-3 and L3-4. 3. Mild diffuse marrow heterogeneity without discrete marrow replacing lesion. Findings are nonspecific but can be seen in the setting of chronic anemia, smoking, and/or obesity. Electronically Signed   By: Davina Poke D.O.   On: 04/24/2020 10:16    Assessment/Plan: 79 year old male presented to the [facility] ED with progressive weakness over the last several months and getting significantly worse in the last week. MRI C, T and L spine reviewed. 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 He has severe stenosis with cord compression and signal change at T10-T11. He also has severe spinal stenosis at L1-L2 as well. 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 I do not think that it is safe to flip him over on his stomach to decompress T10-T11 without first decompressing C3-4. Therefore we will perform an ACDF C3-4 followed by a  laminectomy T10-T11. I discussed all of the risks and benefits associated with the surgery to he and his family. He understood and agrees to move forward. We will plan for surgery on Thursday morning.    Ocie Cornfield Linnie Mcglocklin 04/24/2020 4:31 PM

## 2020-04-24 NOTE — Progress Notes (Signed)
  Patient seen and examined at bedside, patient admitted after midnight, please see earlier detailed admission note by Gean Birchwood, MD. Briefly, patient presented secondary to worsening right leg weakness causing falls. MRI of brain, cervical, thoracic and lumbar spine most significant for C3-4 spinal stenosis and T10-11 spinal stenosis with associated cord compression. Discussed case with Dr. Ronnald Ramp who plans to perform surgery prior to discharge. Patient states he has not taken aspirin in several weeks. Patient reports no saddle anesthesia but per son, there is concern for significant numbness/weakness of right leg especially present the night prior to admission.   General exam: Appears calm and comfortable Respiratory system: Clear to auscultation. Respiratory effort normal. Cardiovascular system: S1 & S2 heard, RRR. No murmurs, rubs, gallops or clicks. Gastrointestinal system: Abdomen is nondistended, soft and nontender. No organomegaly or masses felt. Normal bowel sounds heard. Central nervous system: Alert and oriented. No focal neurological deficits. Good strength in upper/lower extremities. Could not illicit patellar reflexes secondary to knee replacements Musculoskeletal: No edema. No calf tenderness Skin: No cyanosis. No rashes Psychiatry: Judgement and insight appear normal. Mood & affect appropriate.   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 Ronnald Ramp, neurosurgery who requests transfer to [facility] hospital with plan for surgical management. Will need PT/OT after surgery.  Thyroid nodule Incidentally found on MRI. 3.5 cm thyroid nodule. Outpatient follow-up with thyroid ultrasound.  Diabetes mellitus Patient is on a large dose of insulin at home, taking Basaglar 200 units -Will start at weight base dosing per diabetes coordinator recommendations of 15 units; will give one dose this morning and resume qHS dosing this evening -SSI qAC and  qHS   Cordelia Poche, MD [facility] 04/24/2020, 1:15 PM

## 2020-04-24 NOTE — ED Provider Notes (Signed)
[facility] DEPT Provider Note   CSN: [id number] Arrival date & time: 04/23/20  2107     History Chief Complaint  Patient presents with   Difficulty Walking    Johnathan Barker is a 79 y.o. male.  Patient to ED with his wife for evaluation of progressively worsening difficulty walking. He feels it started 3 months ago when he had an episode of vertigo causing imbalance and a fall. He started using a walker at that time and continued to use it after the vertigo resolved because he felt more steady. Over the last several days, he and his wife feel that he has less coordination in his legs. Wife describes that his feet seem to go in different directions. The patient feels his right leg is worse than the left but wife feels it is both equally. She also expresses concerns about his left leg feeling cold.   The history is provided by the patient. No language interpreter was used.       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    Past Surgical History:  Procedure Laterality Date   EYE SURGERY Bilateral    cataracts removed= IOL   TONSILLECTOMY     TOTAL KNEE ARTHROPLASTY Right 11/30/2017   Procedure: RIGHT TOTAL KNEE ARTHROPLASTY;  Surgeon: Vickey Huger, MD;  Location: [facility];  Service: Orthopedics;  Laterality: Right;   TOTAL KNEE ARTHROPLASTY Left 09/13/2018   Procedure: LEFT TOTAL KNEE ARTHROPLASTY;  Surgeon: Vickey Huger, MD;  Location: [facility];  Service: Orthopedics;  Laterality: Left;  with block       Family History  Problem Relation Age of Onset   CAD Neg Hx    Diabetes Mellitus II Neg Hx     Social History   Tobacco Use    Smoking status: Never Smoker   Smokeless tobacco: Never Used  Substance Use Topics   Alcohol use: No   Drug use: No    Home Medications Prior to Admission medications   Medication Sig Start Date End Date Taking? Authorizing Provider  amLODipine (NORVASC) 5 MG tablet Take 5 mg by mouth at bedtime.    Yes [provider]  atorvastatin (LIPITOR) 10 MG tablet Take 10 mg by mouth daily.   Yes [provider]  glimepiride (AMARYL) 4 MG tablet Take 4 mg by mouth 2 (two) times daily. Morning & evening 09/07/17  Yes [provider]  Insulin Glargine (BASAGLAR KWIKPEN) 100 UNIT/ML Inject 2 mLs into the skin at bedtime. 04/10/20  Yes [provider]  metoprolol succinate (TOPROL-XL) 50 MG 24 hr tablet Take 50 mg by mouth daily before breakfast. Take with or immediately following a meal.    Yes [provider]  olmesartan (BENICAR) 40 MG tablet Take 40 mg by mouth daily. 11/09/19  Yes [provider]  Omega-3 Fatty Acids (FISH OIL PO) Take 1 capsule by mouth daily.    Yes [provider]  aspirin EC 325 MG EC tablet Take 1 tablet (325 mg total) by mouth 2 (two) times daily. Patient not taking: Reported on 04/23/2020 09/14/18   Donia Ast,  PA  methocarbamol (ROBAXIN) 500 MG tablet Take 1-2 tablets (500-1,000 mg total) by mouth every 6 (six) hours as needed for muscle spasms. Patient not taking: Reported on 04/23/2020 09/14/18   Donia Ast, PA  oxyCODONE (OXY IR/ROXICODONE) 5 MG immediate release tablet Take 1-2 tablets (5-10 mg total) by mouth every 6 (six) hours as needed for moderate pain (pain score 4-6). Patient not taking: Reported on 04/23/2020 09/14/18   Donia Ast, PA    Allergies    Penicillins  Review of Systems   Review of Systems  Constitutional: Negative for chills and fever.  HENT: Negative.   Respiratory: Negative.  Negative for cough and shortness of breath.   Cardiovascular: Negative.     Gastrointestinal: Positive for constipation. Negative for abdominal pain, nausea and vomiting.  Genitourinary: Negative for dysuria and flank pain.  Musculoskeletal: Negative.  Negative for myalgias.  Skin: Negative.   Neurological: Positive for weakness.       See HPI.    Physical Exam Updated Vital Signs BP (!) 172/96    Pulse 93    Temp 98.8 F (37.1 C) (Oral)    Resp (!) 23    Ht 6' (1.829 m)    Wt 95.3 kg    SpO2 95%    BMI 28.48 kg/m   Physical Exam Vitals and nursing note reviewed.  Constitutional:      Appearance: He is well-developed.  HENT:     Head: Normocephalic.  Cardiovascular:     Rate and Rhythm: Normal rate and regular rhythm.     Pulses: Normal pulses.  Pulmonary:     Effort: Pulmonary effort is normal.     Breath sounds: Normal breath sounds. No wheezing, rhonchi or rales.  Abdominal:     General: Bowel sounds are normal.     Palpations: Abdomen is soft.     Tenderness: There is no abdominal tenderness. There is no guarding or rebound.  Musculoskeletal:        General: Normal range of motion.     Cervical back: Normal range of motion and neck supple.     Comments: No strength deficits to extremities. Left lower leg and foot are cold to touch but have easily found pulses and no discoloration.   Skin:    General: Skin is warm and dry.     Findings: No rash.  Neurological:     Mental Status: He is alert and oriented to person, place, and time.     Sensory: No sensory deficit.     Comments: Marked lack of coordination when lifting the right leg while attempting to ambulate. There is full and symmetric strength to extremities.      ED Results / Procedures / Treatments   Labs (all labs ordered are listed, but only abnormal results are displayed) Labs Reviewed  CBC WITH DIFFERENTIAL/PLATELET - Abnormal; Notable for the following components:      Result Value   WBC 11.7 (*)    Neutro Abs 8.2 (*)    All other components within normal limits   COMPREHENSIVE METABOLIC PANEL - Abnormal; Notable for the following components:   Glucose, Bld 255 (*)    Calcium 8.5 (*)    Total Bilirubin 1.5 (*)    All other components within normal limits  URINALYSIS, ROUTINE W REFLEX MICROSCOPIC - Abnormal; Notable for the following components:   Glucose, UA >=500 (*)    Ketones, ur 20 (*)    All other components within normal limits  CBG MONITORING, ED - Abnormal; Notable for the following components:   Glucose-Capillary 228 (*)    All other components within normal limits  URINE CULTURE  VITAMIN B1  VITAMIN B12  HEMOGLOBIN A1C  MAGNESIUM   Results for orders placed or performed during the hospital encounter of 04/23/20  CBC with Differential  Result Value Ref Range   WBC 11.7 (H) 4.0 - 10.5 K/uL   RBC 4.35 4.22 - 5.81 MIL/uL   Hemoglobin 13.2 13.0 - 17.0 g/dL   HCT 39.1 39.0 - 52.0 %   MCV 89.9 80.0 - 100.0 fL   MCH 30.3 26.0 - 34.0 pg   MCHC 33.8 30.0 - 36.0 g/dL   RDW 12.8 11.5 - 15.5 %   Platelets 330 150 - 400 K/uL   nRBC 0.0 0.0 - 0.2 %   Neutrophils Relative % 71 %   Neutro Abs 8.2 (H) 1.7 - 7.7 K/uL   Lymphocytes Relative 18 %   Lymphs Abs 2.1 0.7 - 4.0 K/uL   Monocytes Relative 8 %   Monocytes Absolute 1.0 0.1 - 1.0 K/uL   Eosinophils Relative 2 %   Eosinophils Absolute 0.3 0.0 - 0.5 K/uL   Basophils Relative 1 %   Basophils Absolute 0.1 0.0 - 0.1 K/uL   Immature Granulocytes 0 %   Abs Immature Granulocytes 0.03 0.00 - 0.07 K/uL  Comprehensive metabolic panel  Result Value Ref Range   Sodium 139 135 - 145 mmol/L   Potassium 3.9 3.5 - 5.1 mmol/L   Chloride 104 98 - 111 mmol/L   CO2 25 22 - 32 mmol/L   Glucose, Bld 255 (H) 70 - 99 mg/dL   BUN 18 8 - 23 mg/dL   Creatinine, Ser 0.85 0.61 - 1.24 mg/dL   Calcium 8.5 (L) 8.9 - 10.3 mg/dL   Total Protein 6.6 6.5 - 8.1 g/dL   Albumin 3.6 3.5 - 5.0 g/dL   AST 20 15 - 41 U/L   ALT 19 0 - 44 U/L   Alkaline Phosphatase 91 38 - 126 U/L   Total Bilirubin 1.5 (H) 0.3 - 1.2  mg/dL   GFR calc non Af Amer >60 >60 mL/min   GFR calc Af Amer >60 >60 mL/min   Anion gap 10 5 - 15  Urinalysis, Routine w reflex microscopic  Result Value Ref Range   Color, Urine YELLOW YELLOW   APPearance CLEAR CLEAR   Specific Gravity, Urine 1.020 1.005 - 1.030   pH 6.0 5.0 - 8.0   Glucose, UA >=500 (A) NEGATIVE mg/dL   Hgb urine dipstick NEGATIVE NEGATIVE   Bilirubin Urine NEGATIVE NEGATIVE   Ketones, ur 20 (A) NEGATIVE mg/dL   Protein, ur NEGATIVE NEGATIVE mg/dL   Nitrite NEGATIVE NEGATIVE   Leukocytes,Ua NEGATIVE NEGATIVE   RBC / HPF 0-5 0 - 5 RBC/hpf   WBC, UA 0-5 0 - 5 WBC/hpf   Bacteria, UA NONE SEEN NONE SEEN  CBG monitoring, ED  Result Value Ref Range   Glucose-Capillary 228 (H) 70 - 99 mg/dL    EKG None  Radiology CT Head Wo Contrast  Result Date: 04/24/2020 CLINICAL DATA:  Difficulty walking for several days EXAM: CT HEAD WITHOUT CONTRAST TECHNIQUE: Contiguous axial images were obtained from the base of the skull through the vertex without intravenous contrast. COMPARISON:  08/30/2018 FINDINGS: Brain: No evidence of acute infarction, hemorrhage, hydrocephalus, extra-axial collection or mass lesion/mass effect. Mild atrophic and ischemic changes are noted stable from prior exam. Vascular: No hyperdense vessel  or unexpected calcification. Skull: Normal. Negative for fracture or focal lesion. Sinuses/Orbits: No acute finding. Other: None. IMPRESSION: Chronic atrophic and ischemic changes without acute abnormality. Electronically Signed   By: Inez Catalina M.D.   On: 04/24/2020 00:28   CT Head Wo Contrast  Result Date: 04/24/2020 CLINICAL DATA:  Difficulty walking for several days EXAM: CT HEAD WITHOUT CONTRAST TECHNIQUE: Contiguous axial images were obtained from the base of the skull through the vertex without intravenous contrast. COMPARISON:  08/30/2018 FINDINGS: Brain: No evidence of acute infarction, hemorrhage, hydrocephalus, extra-axial collection or mass lesion/mass  effect. Mild atrophic and ischemic changes are noted stable from prior exam. Vascular: No hyperdense vessel or unexpected calcification. Skull: Normal. Negative for fracture or focal lesion. Sinuses/Orbits: No acute finding. Other: None. IMPRESSION: Chronic atrophic and ischemic changes without acute abnormality. Electronically Signed   By: Inez Catalina M.D.   On: 04/24/2020 00:28    Procedures Procedures (including critical care time)  Medications Ordered in ED Medications - No data to display  ED Course  I have reviewed the triage vital signs and the nursing notes.  Pertinent labs & imaging results that were available during my care of the patient were reviewed by me and considered in my medical decision making (see chart for details).    MDM Rules/Calculators/A&P                      Patient to ED with wife with progressive difficulty in ambulating as described in the HPI.   The patient is well appearing, awake, alert, oriented. No ss/sxs acute illness. He has an essentially benign exam except when attempting to ambulate when he demonstrates a significant discoordination in gait ability.   Discussed work up with neurology without formal consult request. Dr. Rory Percy recommended MRI w/o of brain and spine - in am is appropriate. Recommended evaluating for vitamin deficiencies (thiamine, B12 were added to labs), magnesium, A1C. Eventual PT/OT evaluation.   Discussed with Dr. Hal Hope who accepts the patient for admission.   Final Clinical Impression(s) / ED Diagnoses Final diagnoses:  Ataxia    Rx / DC Orders ED Discharge Orders    None       Charlann Lange, PA-C 04/24/20 0233    Merryl Hacker, MD 04/24/20 [phone]

## 2020-04-24 NOTE — ED Notes (Signed)
Pt to MRI via stretcher.

## 2020-04-24 NOTE — Progress Notes (Signed)
Patient received on unit at 14:30

## 2020-04-25 LAB — URINE CULTURE: Culture: NO GROWTH

## 2020-04-25 LAB — COMPREHENSIVE METABOLIC PANEL
ALT: 17 U/L (ref 0–44)
AST: 19 U/L (ref 15–41)
Albumin: 3.5 g/dL (ref 3.5–5.0)
Alkaline Phosphatase: 103 U/L (ref 38–126)
Anion gap: 10 (ref 5–15)
BUN: 14 mg/dL (ref 8–23)
CO2: 24 mmol/L (ref 22–32)
Calcium: 9.5 mg/dL (ref 8.9–10.3)
Chloride: 103 mmol/L (ref 98–111)
Creatinine, Ser: 1.04 mg/dL (ref 0.61–1.24)
GFR calc Af Amer: >60 mL/min (ref >60)
GFR calc non Af Amer: >60 mL/min (ref >60)
Glucose, Bld: 320 mg/dL — ABNORMAL HIGH (ref 70–99)
Potassium: 4.9 mmol/L (ref 3.5–5.1)
Sodium: 137 mmol/L (ref 135–145)
Total Bilirubin: 2.1 mg/dL — ABNORMAL HIGH (ref 0.3–1.2)
Total Protein: 7.2 g/dL (ref 6.5–8.1)

## 2020-04-25 LAB — GLUCOSE, CAPILLARY
Glucose-Capillary: 213 mg/dL — ABNORMAL HIGH (ref 70–99)
Glucose-Capillary: 241 mg/dL — ABNORMAL HIGH (ref 70–99)
Glucose-Capillary: 294 mg/dL — ABNORMAL HIGH (ref 70–99)
Glucose-Capillary: 337 mg/dL — ABNORMAL HIGH (ref 70–99)
Glucose-Capillary: 296 mg/dL — ABNORMAL HIGH (ref 70–99)
Glucose-Capillary: 376 mg/dL — ABNORMAL HIGH (ref 70–99)

## 2020-04-25 LAB — CBC
HCT: 44.3 % (ref 39.0–52.0)
Hemoglobin: 15 g/dL (ref 13.0–17.0)
MCH: 30.2 pg (ref 26.0–34.0)
MCHC: 33.9 g/dL (ref 30.0–36.0)
MCV: 89.3 fL (ref 80.0–100.0)
Platelets: 392 10*3/uL (ref 150–400)
RBC: 4.96 MIL/uL (ref 4.22–5.81)
RDW: 12.7 % (ref 11.5–15.5)
WBC: 12.4 10*3/uL — ABNORMAL HIGH (ref 4.0–10.5)
nRBC: 0 % (ref 0.0–0.2)

## 2020-04-25 MED ORDER — VANCOMYCIN HCL IN DEXTROSE 1-5 GM/200ML-% IV SOLN
1000.0000 mg | INTRAVENOUS | Status: DC
  Filled 2020-04-25 (×2): qty 200

## 2020-04-25 MED ORDER — CHLORHEXIDINE GLUCONATE CLOTH 2 % EX PADS
6.0000 | MEDICATED_PAD | Freq: Once | CUTANEOUS | Status: AC
  Administered 2020-04-26: 6 via TOPICAL

## 2020-04-25 MED ORDER — ENSURE PRE-SURGERY PO LIQD
296.0000 mL | Freq: Once | ORAL | Status: AC
  Administered 2020-04-25: 296 mL via ORAL
  Filled 2020-04-25: qty 296

## 2020-04-25 MED ORDER — CHLORHEXIDINE GLUCONATE CLOTH 2 % EX PADS
6.0000 | MEDICATED_PAD | Freq: Once | CUTANEOUS | Status: AC
  Administered 2020-04-25: 6 via TOPICAL

## 2020-04-25 MED ORDER — INSULIN GLARGINE 100 UNIT/ML ~~LOC~~ SOLN
15.0000 [IU] | Freq: Every day | SUBCUTANEOUS | Status: DC
  Administered 2020-04-25: 15 [IU] via SUBCUTANEOUS
  Filled 2020-04-25 (×2): qty 0.15

## 2020-04-25 NOTE — Progress Notes (Signed)
Inpatient Diabetes Program Recommendations  AACE/ADA: New Consensus Statement on Inpatient Glycemic Control (2015)  Target Ranges:  Prepandial:   less than 140 mg/dL      Peak postprandial:   less than 180 mg/dL (1-2 hours)      Critically ill patients:  140 - 180 mg/dL   Lab Results  Component Value Date   GLUCAP 209 (H) 04/24/2020   HGBA1C 7.5 (H) 04/24/2020    Review of Glycemic Control Results for Johnathan Barker, Johnathan Barker (MRN [id number]) as of 04/25/2020 10:41  Ref. Range 04/23/2020 22:42 04/24/2020 07:56  Glucose-Capillary Latest Ref Range: 70 - 99 mg/dL 228 (H) 209 (H)   Diabetes history: DM 2 Outpatient Diabetes medications: Basaglar 200 units qhs, Amaryl 4 mg bid Current orders for Inpatient glycemic control:  Lantus 15 units QHS, Novolog 0-9 units tid Decadron 4 mg Q6H  A1c 7.5% on 6/1  Inpatient Diabetes Program Recommendations:    Consider switching basal to this AM (to start now).  If post prandials are elevated in the setting of steroids, may also consider adding Novolog 3 units TID (assuming patient is consuming >50% of meal).   Patient is elevated this AM, has not received basal since admission. Secure chat sent to RN regarding CBGs crossing over.   Thanks, Bronson Curb, MSN, RNC-OB Diabetes Coordinator [phone] (8a-5p)

## 2020-04-25 NOTE — Progress Notes (Signed)
Subjective: Patient reports doing well, no acute changes overnight.   Objective: Vital signs in last 24 hours: Temp:  [97.8 F (36.6 C)-98.6 F (37 C)] 98 F (36.7 C) (06/02 0915) Pulse Rate:  [68-88] 88 (06/02 0915) Resp:  [16-19] 18 (06/02 0915) BP: (136-181)/(80-89) 136/82 (06/02 0915) SpO2:  [95 %-100 %] 95 % (06/02 0915) Weight:  [92.3 kg] 92.3 kg (06/01 1413)  Intake/Output from previous day: 06/01 0701 - 06/02 0700 In: 600 [P.O.:600] Out: 1700 [Urine:1700] Intake/Output this shift: Total I/O In: 120 [P.O.:120] Out: 450 [Urine:450]  Neuro exam unchanged, lower extremities spastic and hyperreflexic, strength 5/5 BLE, strength 5/5 BUE  Lab Results: Lab Results  Component Value Date   WBC 12.4 (H) 04/25/2020   HGB 15.0 04/25/2020   HCT 44.3 04/25/2020   MCV 89.3 04/25/2020   PLT 392 04/25/2020   No results found for: INR, PROTIME BMET Lab Results  Component Value Date   NA 137 04/25/2020   K 4.9 04/25/2020   CL 103 04/25/2020   CO2 24 04/25/2020   GLUCOSE 320 (H) 04/25/2020   BUN 14 04/25/2020   CREATININE 1.04 04/25/2020   CALCIUM 9.5 04/25/2020    Studies/Results: CT Head Wo Contrast  Result Date: 04/24/2020 CLINICAL DATA:  Difficulty walking for several days EXAM: CT HEAD WITHOUT CONTRAST TECHNIQUE: Contiguous axial images were obtained from the base of the skull through the vertex without intravenous contrast. COMPARISON:  08/30/2018 FINDINGS: Brain: No evidence of acute infarction, hemorrhage, hydrocephalus, extra-axial collection or mass lesion/mass effect. Mild atrophic and ischemic changes are noted stable from prior exam. Vascular: No hyperdense vessel or unexpected calcification. Skull: Normal. Negative for fracture or focal lesion. Sinuses/Orbits: No acute finding. Other: None. IMPRESSION: Chronic atrophic and ischemic changes without acute abnormality. Electronically Signed   By: Inez Catalina M.D.   On: 04/24/2020 00:28   MR BRAIN WO  CONTRAST  Result Date: 04/24/2020 CLINICAL DATA:  Focal neurological deficit, stroke suspected. EXAM: MRI HEAD WITHOUT CONTRAST TECHNIQUE: Multiplanar, multiecho pulse sequences of the brain and surrounding structures were obtained without intravenous contrast. COMPARISON:  04/24/2020 CT head. FINDINGS: Brain: No diffusion-weighted signal abnormality to suggest acute infarct. Mild diffuse parenchymal volume loss with ex vacuo dilatation. Scattered periventricular and deep white matter T2/FLAIR hyperintensities are nonspecific however commonly associated with chronic microvascular ischemic changes. 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 Left frontal superficial siderosis. No midline shift or extra-axial fluid collection. No mass lesion. Vascular: Normal flow voids. Skull and upper cervical spine: Normal marrow signal. Sinuses/Orbits: Sequela of bilateral lens replacement. Clear paranasal sinuses. No mastoid effusion. Other: None. IMPRESSION: No acute intracranial process.  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 Mild cerebral atrophy and chronic microvascular ischemic changes. Electronically Signed   By: Primitivo Gauze M.D.   On: 04/24/2020 09:16   MR CERVICAL SPINE WO CONTRAST  Result Date: 04/24/2020 CLINICAL DATA:  Chronic neck pain, difficulty walking EXAM: MRI CERVICAL SPINE WITHOUT CONTRAST TECHNIQUE: Multiplanar, multisequence MR imaging of the cervical spine was performed. No intravenous contrast was administered. COMPARISON:  None. FINDINGS: Alignment: No significant anteroposterior listhesis. Vertebrae: No substantial marrow edema or suspicious osseous lesion. Cord: No definite abnormal signal. Posterior Fossa, vertebral arteries, paraspinal tissues: Partially imaged right thyroid lobe nodule measuring 3.5 cm. Otherwise unremarkable. Disc levels: C2-C3: Facet hypertrophy with fusion on the right. No significant  canal or foraminal stenosis. C3-C4: Disc bulge with superimposed central disc protrusion, endplate osteophytes, and  facet and uncovertebral hypertrophy. There is thickening and possible calcification along the ligamentum flavum on the right. Marked canal stenosis. Moderate to marked foraminal stenosis. C4-C5: Partial fusion across the disc space with endplate osteophytes and uncovertebral greater than facet hypertrophy. Mild canal stenosis. Mild right and moderate left foraminal stenosis. C5-C6: Partial fusion across the disc space with endplate osteophytes and uncovertebral greater than facet hypertrophy. Mild canal stenosis. No significant right foraminal stenosis. Minor left foraminal stenosis. C6-C7: Partial fusion across the disc space with endplate osteophytes and uncovertebral hypertrophy eccentric to the left. No significant canal stenosis. There is partial effacement the left ventral subarachnoid space. No significant right foraminal stenosis. Moderate left foraminal stenosis. C7-T1: Disc bulge, endplate osteophytes, and facet hypertrophy. No significant canal or foraminal stenosis. IMPRESSION: Multilevel degenerative changes as detailed above. Most notably, there is marked canal stenosis at C3-C4 without definite abnormal cord signal. 3.5 cm right thyroid lobe nodule. Recommend thyroid ultrasound if not previously performed. (Ref: J Am Coll Radiol. 2015 Feb;12(2): 143-50). Electronically Signed   By: Macy Mis M.D.   On: 04/24/2020 09:36   MR THORACIC SPINE WO CONTRAST  Result Date: 04/24/2020 CLINICAL DATA:  Back pain, difficulty walking EXAM: MRI THORACIC SPINE WITHOUT CONTRAST TECHNIQUE: Multiplanar, multisequence MR imaging of the thoracic spine was performed. No intravenous contrast was administered. COMPARISON:  None. FINDINGS: Motion artifact is present. Alignment: Preserved thoracic kyphosis and anterior posterior alignment. Vertebrae: Vertebral body heights are preserved apart from  degenerative endplate irregularity, greatest at T10-T11. There large bridging anterolateral endplate osteophytes, particularly at T10-T11. Degenerative endplate marrow edema is present at T10-T11. There is also degenerative edema associated with the left facets at this level. Cord:  There is cord T2 hyperintensity at T10-T11. Paraspinal and other soft tissues: Unremarkable. Disc levels: There is mild multilevel degenerative disc disease and facet arthropathy. For example, small left paracentral disc protrusion at T1-T2 and small disc bulge and right paracentral disc protrusion at T8-T9. More significantly, at T10-T11, there is a disc bulge eccentric to the left, endplate osteophytes, and facet arthropathy with hypertrophic changes and ligamentum flavum thickening. This results in severe canal stenosis with cord compression. Mild to moderate foraminal stenosis. 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 These results were called by telephone at the time of interpretation on 04/24/2020 at 10:15 am to provider Dr. Teryl Lucy, who verbally acknowledged these results. Electronically Signed   By: Macy Mis M.D.   On: 04/24/2020 10:19   MR LUMBAR SPINE WO CONTRAST  Result Date: 04/24/2020 CLINICAL DATA:  Low back pain.  Difficulty walking EXAM: MRI LUMBAR SPINE WITHOUT CONTRAST TECHNIQUE: Multiplanar, multisequence MR imaging of the lumbar spine was performed. No intravenous contrast was administered. COMPARISON:  None. FINDINGS: Segmentation: Presumed standard anatomy with the inferior-most well developed disc space designated as L5-S1. Alignment:  Trace retrolisthesis L5 on S1.  Mild levocurvature. Vertebrae: No acute fracture, discitis, or suspicious bone lesion. The L2-3 and L4-5 levels appear chronically fused. Discogenic endplate marrow changes at L1-2 and L3-4. Mild  diffuse marrow heterogeneity without discrete marrow replacing lesion. Findings are nonspecific but can be seen in the setting of chronic anemia, smoking, and/or obesity. Conus medullaris and cauda equina: Conus extends to the L1 level. Conus and cauda equina appear normal. Paraspinal and other soft tissues: Negative. Disc levels: T12-L1: Mild bilateral facet arthropathy. No disc protrusion. No foraminal or canal stenosis. L1-L2: Disc  height loss with mild diffuse disc bulge and degenerative endplate spurring. Right worse than left facet arthropathy and ligamentum flavum buckling result in severe canal stenosis with moderate right-sided foraminal stenosis. L2-L3: Fused disc space. Mild bilateral facet arthropathy resulting in mild-to-moderate canal stenosis. Prominent left extraforaminal endplate spurring slightly displaces the exited left L2 nerve root. No foraminal stenosis. L3-L4: Disc height loss with mild diffuse disc bulge and degenerative endplate spurring. Moderate bilateral facet arthropathy and ligamentum flavum buckling results in mild-to-moderate canal stenosis. Mild-to-moderate right and mild left foraminal stenosis. L4-L5: Fused disc space. Bilateral extraforaminal spurring laterally deviate the exited L4 nerve roots. No foraminal or canal stenosis. L5-S1: Disc height loss with endplate spurring and shallow central disc protrusion. Moderate left and mild right foraminal stenosis. No canal stenosis. IMPRESSION: 1. Multilevel degenerative changes of the lumbar spine greatest at L1-2 where there is severe canal stenosis and moderate right foraminal stenosis. 2. Mild-to-moderate canal stenosis at L2-3 and L3-4. 3. Mild diffuse marrow heterogeneity without discrete marrow replacing lesion. Findings are nonspecific but can be seen in the setting of chronic anemia, smoking, and/or obesity. Electronically Signed   By: Davina Poke D.O.   On: 04/24/2020 10:16    Assessment/Plan: Planning for ACDF C3-4  and laminectomy T10-T11 tomorrow. NPO after midnight.    LOS: 1 day    Johnathan Barker 04/25/2020, 12:57 PM

## 2020-04-25 NOTE — Progress Notes (Signed)
PROGRESS NOTE    Johnathan Barker  [id number] DOB: [birth year] DOA: 04/23/2020 PCP: Nicoletta Dress, MD    Brief Narrative:  79 y.o. male with history of hypertension diabetes mellitus type 2 hyperlipidemia who was recently started on Lantus insulin 2 months ago presents to the ER because of increasing weakness over the last 4 to 5 days.  Weakness is mostly in the right lower extremity.  Has difficulty ambulating.  Patient states over the last 2 months patient has been using walker to walk.  Denies any incontinence of urine tingling or numbness or any difficulty swallowing speaking or visual symptoms.  Has no weakness of the upper extremity.   Assessment & Plan:   Principal Problem:   Weakness of right lower extremity Active Problems:   Essential hypertension   Diabetes mellitus type 2 in nonobese (HCC)   Cord compression (HCC)  1. Right lower extremity weakness with difficulty ambulating and ataxia 1. 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 Neurosurgery consulted with plan for ACDF C3-4 and laminectomy T10-11 tomorrow 3. Plan PT after surgery when OK with Neurosurgery 2. Diabetes mellitus type 2 uncontrolled  1. recently started on Lantus insulin which will be continued.   2. -cont SSI coverage as needed 3. Hemoglobin A1c 7.5 3. Hypertension on ARB amlodipine and beta-blockers. 1. BP stable at this time 2. Cont current regimen 4. Hyperlipidemia  1. Continue on statins as tolerated  DVT prophylaxis: SCD's Code Status: Full Family Communication: Pt in room, family not at bedside  Status is: Inpatient  Remains inpatient appropriate because:Ongoing diagnostic testing needed not appropriate for outpatient work up   Dispo: The patient is from: Home              Anticipated d/c is to: depending on PT eval post-op              Anticipated d/c date is: 2 days              Patient currently is not medically stable to d/c.   Consultants:   Neurosurgery  Procedures:     Antimicrobials: Anti-infectives (From admission, onward)   None       Subjective: Complaining of continued LE weakness  Objective: Vitals:   04/24/20 1830 04/25/20 0215 04/25/20 0459 04/25/20 0915  BP: (!) 157/89 (!) 148/80 (!) 156/85 136/82  Pulse: 68 70 87 88  Resp: 19 18 18 18   Temp: 97.8 F (36.6 C) 98 F (36.7 C) 98.6 F (37 C) 98 F (36.7 C)  TempSrc: Oral Oral Oral Oral  SpO2: 96% 98% 95% 95%  Weight:      Height:        Intake/Output Summary (Last 24 hours) at 04/25/2020 1641 Last data filed at 04/25/2020 1300 Gross per 24 hour  Intake 940 ml  Output 1750 ml  Net -810 ml   Filed Weights   04/23/20 2124 04/24/20 1413  Weight: 95.3 kg 92.3 kg    Examination:  General exam: Appears calm and comfortable  Respiratory system: Clear to auscultation. Respiratory effort normal. Cardiovascular system: S1 & S2 heard, Regular Gastrointestinal system: Abdomen is nondistended, soft and nontender. No organomegaly or masses felt. Normal bowel sounds heard. Central nervous system: Alert and oriented. No focal neurological deficits. Extremities: Symmetric 5 x 5 power. Skin: No rashes, lesions  Psychiatry: Judgement and insight appear normal. Mood & affect appropriate.   Data Reviewed: I have personally reviewed following labs and imaging studies  CBC: Recent Labs  Lab 04/23/20 2302 04/24/20 0548 04/25/20 0546  WBC 11.7* 13.4* 12.4*  NEUTROABS 8.2*  --   --   HGB 13.2 13.7 15.0  HCT 39.1 40.0 44.3  MCV 89.9 89.5 89.3  PLT 330 332 0000000   Basic Metabolic Panel: Recent Labs  Lab 04/23/20 2302 04/24/20 0240 04/24/20 0548 04/25/20 0546  NA 139  --  140 137  K 3.9  --  3.8 4.9  CL 104  --  105 103  CO2 25  --  27 24  GLUCOSE 255*  --  216* 320*  BUN 18  --  14 14  CREATININE 0.85  --  0.81 1.04    CALCIUM 8.5*  --  8.6* 9.5  MG  --  1.8  --   --    GFR: Estimated Creatinine Clearance: 65.3 mL/min (by C-G formula based on SCr of 1.04 mg/dL). Liver Function Tests: Recent Labs  Lab 04/23/20 2302 04/25/20 0546  AST 20 19  ALT 19 17  ALKPHOS 91 103  BILITOT 1.5* 2.1*  PROT 6.6 7.2  ALBUMIN 3.6 3.5   No results for input(s): LIPASE, AMYLASE in the last 168 hours. No results for input(s): AMMONIA in the last 168 hours. Coagulation Profile: No results for input(s): INR, PROTIME in the last 168 hours. Cardiac Enzymes: No results for input(s): CKTOTAL, CKMB, CKMBINDEX, TROPONINI in the last 168 hours. BNP (last 3 results) No results for input(s): PROBNP in the last 8760 hours. HbA1C: Recent Labs    04/24/20 0548  HGBA1C 7.5*   CBG: Recent Labs  Lab 04/23/20 2242 04/24/20 0756  GLUCAP 228* 209*   Lipid Profile: No results for input(s): CHOL, HDL, LDLCALC, TRIG, CHOLHDL, LDLDIRECT in the last 72 hours. Thyroid Function Tests: No results for input(s): TSH, T4TOTAL, FREET4, T3FREE, THYROIDAB in the last 72 hours. Anemia Panel: Recent Labs    04/24/20 0240  VITAMINB12 545   Sepsis Labs: No results for input(s): PROCALCITON, LATICACIDVEN in the last 168 hours.  Recent Results (from the past 240 hour(s))  Urine culture     Status: None   Collection Time: 04/23/20 10:39 PM   Specimen: Urine, Clean Catch  Result Value Ref Range Status   Specimen Description   Final    URINE, CLEAN CATCH Performed at [facility], [place] [address], [place], [place] [address]    Special Requests   Final    NONE Performed at [facility], [place] [address], [place], [place] [address]    Culture   Final    NO GROWTH Performed at [facility], [place] [address], [place], [place] [address]    Report Status 04/25/2020 FINAL  Final  SARS Coronavirus 2 by RT PCR (hospital order, performed in [facility] hospital lab) Nasopharyngeal Nasopharyngeal  Swab     Status: None   Collection Time: 04/24/20  6:45 AM   Specimen: Nasopharyngeal Swab  Result Value Ref Range Status   SARS Coronavirus 2 NEGATIVE NEGATIVE Final    Comment: (NOTE) SARS-CoV-2 target nucleic acids are NOT DETECTED. The SARS-CoV-2 RNA is generally detectable in upper and lower respiratory specimens during the acute phase of infection. The lowest concentration of SARS-CoV-2 viral copies this assay can detect is 250 copies / mL. A negative result does not preclude SARS-CoV-2 infection and should not  be used as the sole basis for treatment or other patient management decisions.  A negative result may occur with improper specimen collection / handling, submission of specimen other than nasopharyngeal swab, presence of viral mutation(s) within the areas targeted by this assay, and inadequate number of viral copies (<250 copies / mL). A negative result must be combined with clinical observations, patient history, and epidemiological information. Fact Sheet for Patients:   StrictlyIdeas.no Fact Sheet for Healthcare Providers: BankingDealers.co.za This test is not yet approved or cleared  by the [place] FDA and has been authorized for detection and/or diagnosis of SARS-CoV-2 by FDA under an Emergency Use Authorization (EUA).  This EUA will remain in effect (meaning this test can be used) for the duration of the COVID-19 declaration under Section 564(b)(1) of the Act, 21 U.S.C. section 360bbb-3(b)(1), unless the authorization is terminated or revoked sooner. Performed at [facility], [place] [address] [place], [place] [address]      Radiology Studies: CT Head Wo Contrast  Result Date: 04/24/2020 CLINICAL DATA:  Difficulty walking for several days EXAM: CT HEAD WITHOUT CONTRAST TECHNIQUE: Contiguous axial images were obtained from the base of the skull through the vertex without intravenous contrast.  COMPARISON:  08/30/2018 FINDINGS: Brain: No evidence of acute infarction, hemorrhage, hydrocephalus, extra-axial collection or mass lesion/mass effect. Mild atrophic and ischemic changes are noted stable from prior exam. Vascular: No hyperdense vessel or unexpected calcification. Skull: Normal. Negative for fracture or focal lesion. Sinuses/Orbits: No acute finding. Other: None. IMPRESSION: Chronic atrophic and ischemic changes without acute abnormality. Electronically Signed   By: Inez Catalina M.D.   On: 04/24/2020 00:28   MR BRAIN WO CONTRAST  Result Date: 04/24/2020 CLINICAL DATA:  Focal neurological deficit, stroke suspected. EXAM: MRI HEAD WITHOUT CONTRAST TECHNIQUE: Multiplanar, multiecho pulse sequences of the brain and surrounding structures were obtained without intravenous contrast. COMPARISON:  04/24/2020 CT head. FINDINGS: Brain: No diffusion-weighted signal abnormality to suggest acute infarct. Mild diffuse parenchymal volume loss with ex vacuo dilatation. Scattered periventricular and deep white matter T2/FLAIR hyperintensities are nonspecific however commonly associated with chronic microvascular ischemic changes. 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 Left frontal superficial siderosis. No midline shift or extra-axial fluid collection. No mass lesion. Vascular: Normal flow voids. Skull and upper cervical spine: Normal marrow signal. Sinuses/Orbits: Sequela of bilateral lens replacement. Clear paranasal sinuses. No mastoid effusion. Other: None. IMPRESSION: No acute intracranial process.  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 Mild cerebral atrophy and chronic microvascular ischemic changes. Electronically Signed   By: Primitivo Gauze M.D.   On: 04/24/2020 09:16   MR CERVICAL SPINE WO CONTRAST  Result Date: 04/24/2020 CLINICAL DATA:  Chronic neck pain, difficulty walking EXAM: MRI CERVICAL SPINE WITHOUT  CONTRAST TECHNIQUE: Multiplanar, multisequence MR imaging of the cervical spine was performed. No intravenous contrast was administered. COMPARISON:  None. FINDINGS: Alignment: No significant anteroposterior listhesis. Vertebrae: No substantial marrow edema or suspicious osseous lesion. Cord: No definite abnormal signal. Posterior Fossa, vertebral arteries, paraspinal tissues: Partially imaged right thyroid lobe nodule measuring 3.5 cm. Otherwise unremarkable. Disc levels: C2-C3: Facet hypertrophy with fusion on the right. No significant canal or foraminal stenosis. C3-C4: Disc bulge with superimposed central disc protrusion, endplate osteophytes, and facet and uncovertebral hypertrophy. There is thickening and possible calcification along the ligamentum flavum on the right. Marked canal stenosis. Moderate to marked foraminal stenosis. C4-C5: Partial fusion across the disc space with endplate osteophytes  and uncovertebral greater than facet hypertrophy. Mild canal stenosis. Mild right and moderate left foraminal stenosis. C5-C6: Partial fusion across the disc space with endplate osteophytes and uncovertebral greater than facet hypertrophy. Mild canal stenosis. No significant right foraminal stenosis. Minor left foraminal stenosis. C6-C7: Partial fusion across the disc space with endplate osteophytes and uncovertebral hypertrophy eccentric to the left. No significant canal stenosis. There is partial effacement the left ventral subarachnoid space. No significant right foraminal stenosis. Moderate left foraminal stenosis. C7-T1: Disc bulge, endplate osteophytes, and facet hypertrophy. No significant canal or foraminal stenosis. IMPRESSION: Multilevel degenerative changes as detailed above. Most notably, there is marked canal stenosis at C3-C4 without definite abnormal cord signal. 3.5 cm right thyroid lobe nodule. Recommend thyroid ultrasound if not previously performed. (Ref: J Am Coll Radiol. 2015 Feb;12(2): 143-50).  Electronically Signed   By: Macy Mis M.D.   On: 04/24/2020 09:36   MR THORACIC SPINE WO CONTRAST  Result Date: 04/24/2020 CLINICAL DATA:  Back pain, difficulty walking EXAM: MRI THORACIC SPINE WITHOUT CONTRAST TECHNIQUE: Multiplanar, multisequence MR imaging of the thoracic spine was performed. No intravenous contrast was administered. COMPARISON:  None. FINDINGS: Motion artifact is present. Alignment: Preserved thoracic kyphosis and anterior posterior alignment. Vertebrae: Vertebral body heights are preserved apart from degenerative endplate irregularity, greatest at T10-T11. There large bridging anterolateral endplate osteophytes, particularly at T10-T11. Degenerative endplate marrow edema is present at T10-T11. There is also degenerative edema associated with the left facets at this level. Cord:  There is cord T2 hyperintensity at T10-T11. Paraspinal and other soft tissues: Unremarkable. Disc levels: There is mild multilevel degenerative disc disease and facet arthropathy. For example, small left paracentral disc protrusion at T1-T2 and small disc bulge and right paracentral disc protrusion at T8-T9. More significantly, at T10-T11, there is a disc bulge eccentric to the left, endplate osteophytes, and facet arthropathy with hypertrophic changes and ligamentum flavum thickening. This results in severe canal stenosis with cord compression. Mild to moderate foraminal stenosis. 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 These results were called by telephone at the time of interpretation on 04/24/2020 at 10:15 am to provider Dr. Teryl Lucy, who verbally acknowledged these results. Electronically Signed   By: Macy Mis M.D.   On: 04/24/2020 10:19   MR LUMBAR SPINE WO CONTRAST  Result Date: 04/24/2020 CLINICAL DATA:  Low back pain.  Difficulty walking EXAM: MRI  LUMBAR SPINE WITHOUT CONTRAST TECHNIQUE: Multiplanar, multisequence MR imaging of the lumbar spine was performed. No intravenous contrast was administered. COMPARISON:  None. FINDINGS: Segmentation: Presumed standard anatomy with the inferior-most well developed disc space designated as L5-S1. Alignment:  Trace retrolisthesis L5 on S1.  Mild levocurvature. Vertebrae: No acute fracture, discitis, or suspicious bone lesion. The L2-3 and L4-5 levels appear chronically fused. Discogenic endplate marrow changes at L1-2 and L3-4. Mild diffuse marrow heterogeneity without discrete marrow replacing lesion. Findings are nonspecific but can be seen in the setting of chronic anemia, smoking, and/or obesity. Conus medullaris and cauda equina: Conus extends to the L1 level. Conus and cauda equina appear normal. Paraspinal and other soft tissues: Negative. Disc levels: T12-L1: Mild bilateral facet arthropathy. No disc protrusion. No foraminal or canal stenosis. L1-L2: Disc height loss with mild diffuse disc bulge and degenerative endplate spurring. Right worse than left facet arthropathy and ligamentum flavum buckling result in severe canal stenosis with moderate right-sided foraminal stenosis. L2-L3: Fused disc space.  Mild bilateral facet arthropathy resulting in mild-to-moderate canal stenosis. Prominent left extraforaminal endplate spurring slightly displaces the exited left L2 nerve root. No foraminal stenosis. L3-L4: Disc height loss with mild diffuse disc bulge and degenerative endplate spurring. Moderate bilateral facet arthropathy and ligamentum flavum buckling results in mild-to-moderate canal stenosis. Mild-to-moderate right and mild left foraminal stenosis. L4-L5: Fused disc space. Bilateral extraforaminal spurring laterally deviate the exited L4 nerve roots. No foraminal or canal stenosis. L5-S1: Disc height loss with endplate spurring and shallow central disc protrusion. Moderate left and mild right foraminal  stenosis. No canal stenosis. IMPRESSION: 1. Multilevel degenerative changes of the lumbar spine greatest at L1-2 where there is severe canal stenosis and moderate right foraminal stenosis. 2. Mild-to-moderate canal stenosis at L2-3 and L3-4. 3. Mild diffuse marrow heterogeneity without discrete marrow replacing lesion. Findings are nonspecific but can be seen in the setting of chronic anemia, smoking, and/or obesity. Electronically Signed   By: Davina Poke D.O.   On: 04/24/2020 10:16    Scheduled Meds: . amLODipine  5 mg Oral QHS  . atorvastatin  10 mg Oral Daily  . dexamethasone (DECADRON) injection  4 mg Intravenous Q6H  . insulin aspart  0-9 Units Subcutaneous TID WC  . insulin glargine  15 Units Subcutaneous Once  . insulin glargine  15 Units Subcutaneous Daily  . irbesartan  300 mg Oral Daily  . metoprolol succinate  50 mg Oral QAC breakfast  . omega-3 acid ethyl esters  1 g Oral Daily   Continuous Infusions:   LOS: 1 day   Marylu Lund, MD [facility] Pager On Amion  If 7PM-7AM, please contact night-coverage 04/25/2020, 4:41 PM

## 2020-04-26 ENCOUNTER — Encounter (HOSPITAL_COMMUNITY)
Admission: EM | Disposition: A | Discharge status: Rehabilitation Facility | Payer: Self-pay | Source: Home / Self Care | Attending: Neurological Surgery

## 2020-04-26 ENCOUNTER — Inpatient Hospital Stay (HOSPITAL_COMMUNITY): Payer: Medicare Other

## 2020-04-26 DIAGNOSIS — Z419 Encounter for procedure for purposes other than remedying health state, unspecified: Secondary | ICD-10-CM

## 2020-04-26 DIAGNOSIS — G959 Disease of spinal cord, unspecified: Secondary | ICD-10-CM | POA: Diagnosis present

## 2020-04-26 HISTORY — PX: LUMBAR LAMINECTOMY/DECOMPRESSION MICRODISCECTOMY: SHX5026

## 2020-04-26 HISTORY — PX: ANTERIOR CERVICAL DECOMP/DISCECTOMY FUSION: SHX1161

## 2020-04-26 LAB — CBC
HCT: 41 % (ref 39.0–52.0)
Hemoglobin: 13.9 g/dL (ref 13.0–17.0)
MCH: 30.1 pg (ref 26.0–34.0)
MCHC: 33.9 g/dL (ref 30.0–36.0)
MCV: 88.7 fL (ref 80.0–100.0)
Platelets: 362 10*3/uL (ref 150–400)
RBC: 4.62 MIL/uL (ref 4.22–5.81)
RDW: 12.8 % (ref 11.5–15.5)
WBC: 16.7 10*3/uL — ABNORMAL HIGH (ref 4.0–10.5)
nRBC: 0 % (ref 0.0–0.2)

## 2020-04-26 LAB — GLUCOSE, CAPILLARY
Glucose-Capillary: 364 mg/dL — ABNORMAL HIGH (ref 70–99)
Glucose-Capillary: 328 mg/dL — ABNORMAL HIGH (ref 70–99)
Glucose-Capillary: 282 mg/dL — ABNORMAL HIGH (ref 70–99)
Glucose-Capillary: 218 mg/dL — ABNORMAL HIGH (ref 70–99)
Glucose-Capillary: 235 mg/dL — ABNORMAL HIGH (ref 70–99)
Glucose-Capillary: 223 mg/dL — ABNORMAL HIGH (ref 70–99)
Glucose-Capillary: 269 mg/dL — ABNORMAL HIGH (ref 70–99)
Glucose-Capillary: 275 mg/dL — ABNORMAL HIGH (ref 70–99)

## 2020-04-26 LAB — COMPREHENSIVE METABOLIC PANEL
ALT: 15 U/L (ref 0–44)
AST: 18 U/L (ref 15–41)
Albumin: 3.1 g/dL — ABNORMAL LOW (ref 3.5–5.0)
Alkaline Phosphatase: 91 U/L (ref 38–126)
Anion gap: 8 (ref 5–15)
BUN: 24 mg/dL — ABNORMAL HIGH (ref 8–23)
CO2: 24 mmol/L (ref 22–32)
Calcium: 8.9 mg/dL (ref 8.9–10.3)
Chloride: 103 mmol/L (ref 98–111)
Creatinine, Ser: 1 mg/dL (ref 0.61–1.24)
GFR calc Af Amer: >60 mL/min (ref >60)
GFR calc non Af Amer: >60 mL/min (ref >60)
Glucose, Bld: 417 mg/dL — ABNORMAL HIGH (ref 70–99)
Potassium: 4 mmol/L (ref 3.5–5.1)
Sodium: 135 mmol/L (ref 135–145)
Total Bilirubin: 1.5 mg/dL — ABNORMAL HIGH (ref 0.3–1.2)
Total Protein: 6.5 g/dL (ref 6.5–8.1)

## 2020-04-26 LAB — SURGICAL PCR SCREEN
MRSA, PCR: NEGATIVE
Staphylococcus aureus: NEGATIVE

## 2020-04-26 IMAGING — RF DG C-ARM 1-60 MIN
1 series · 3 of 3 positions shown · non-contrast
Comparison: MRI [DATE]

CLINICAL DATA: C3-4 ACDF and T10-11 laminectomy

EXAM:
CERVICAL SPINE - 2-3 VIEW; THORACOLUMBAR SPINE - 2 VIEW; DG C-ARM
1-60 MIN

[Series 1: run · 3 of 3 slices shown]
[im 1/3]
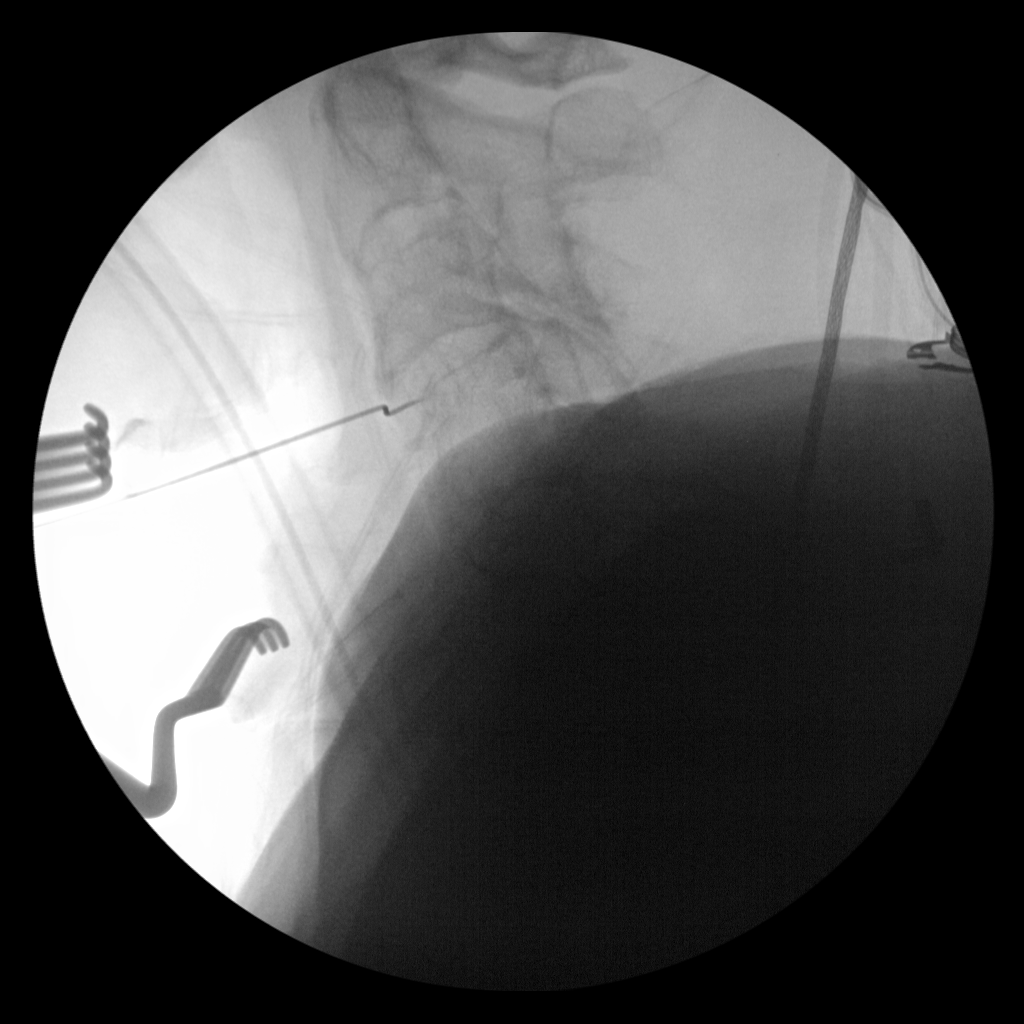
[im 2/3]
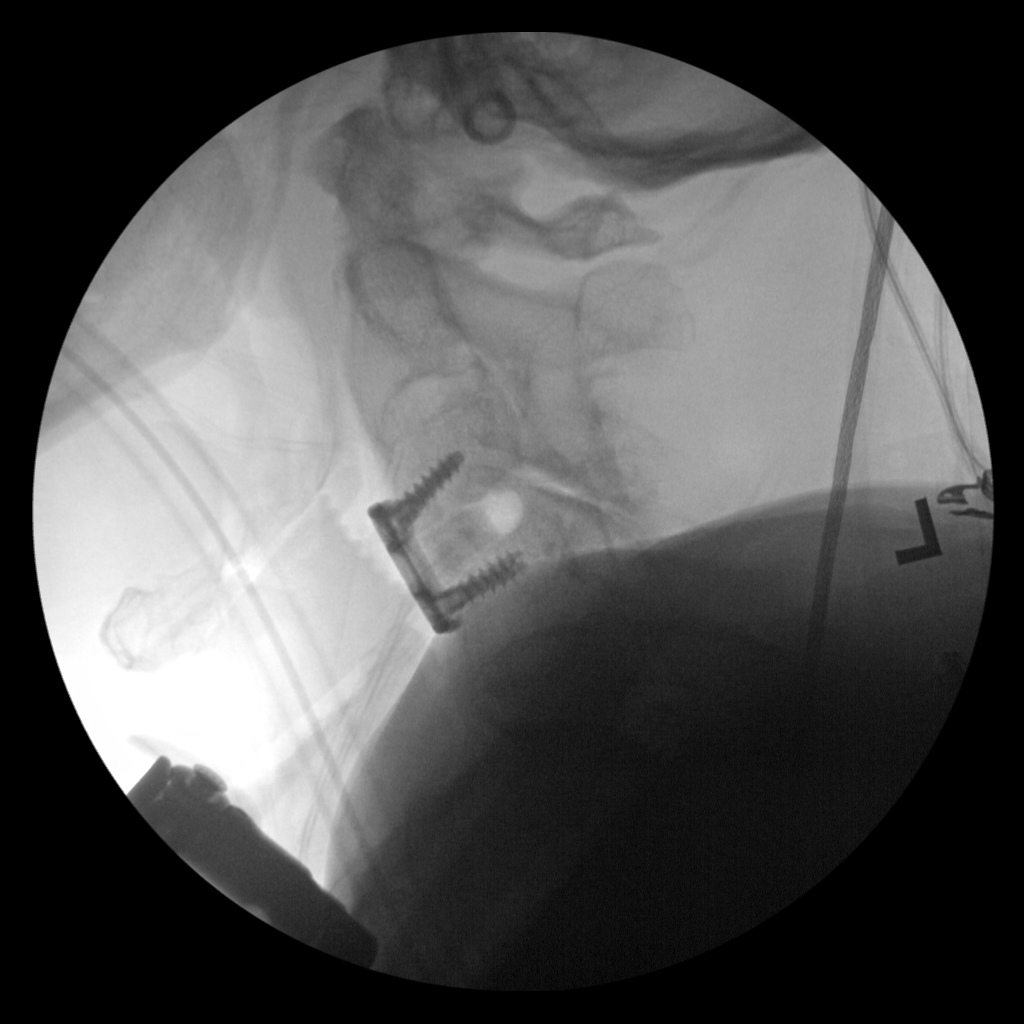
[im 3/3]
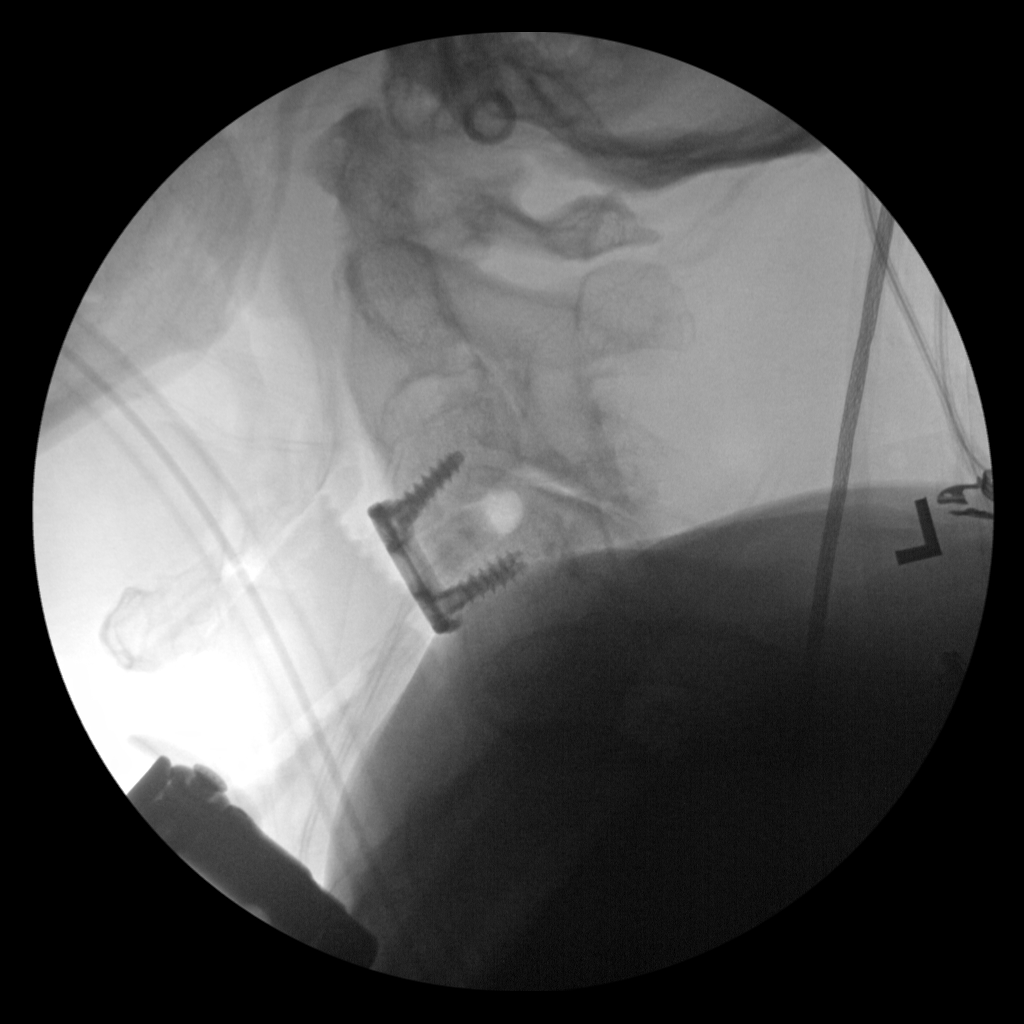

[3 of 3 positions shown; findings below may reference images not displayed]

FINDINGS: 4 C-arm fluoroscopic images were obtained intraoperatively and
submitted for post operative interpretation. Intraoperative
fluoroscopic images demonstrate placement of ACDF hardware at the
C3-4 level. Additional images demonstrate surgical instrumentation
in the lower thoracic spine the T10-11 level. A total of
seconds of fluoroscopy time was utilized. Please see the performing
provider's procedural report for further detail.
IMPRESSION: As above.

## 2020-04-26 IMAGING — RF DG THORACOLUMBAR SPINE 2V
1 series · 2 of 2 positions shown · non-contrast
Comparison: MRI [DATE]

CLINICAL DATA: C3-4 ACDF and T10-11 laminectomy

EXAM:
CERVICAL SPINE - 2-3 VIEW; THORACOLUMBAR SPINE - 2 VIEW; DG C-ARM
1-60 MIN

[Series 1: run · 2 of 2 slices shown]
[im 1/2]
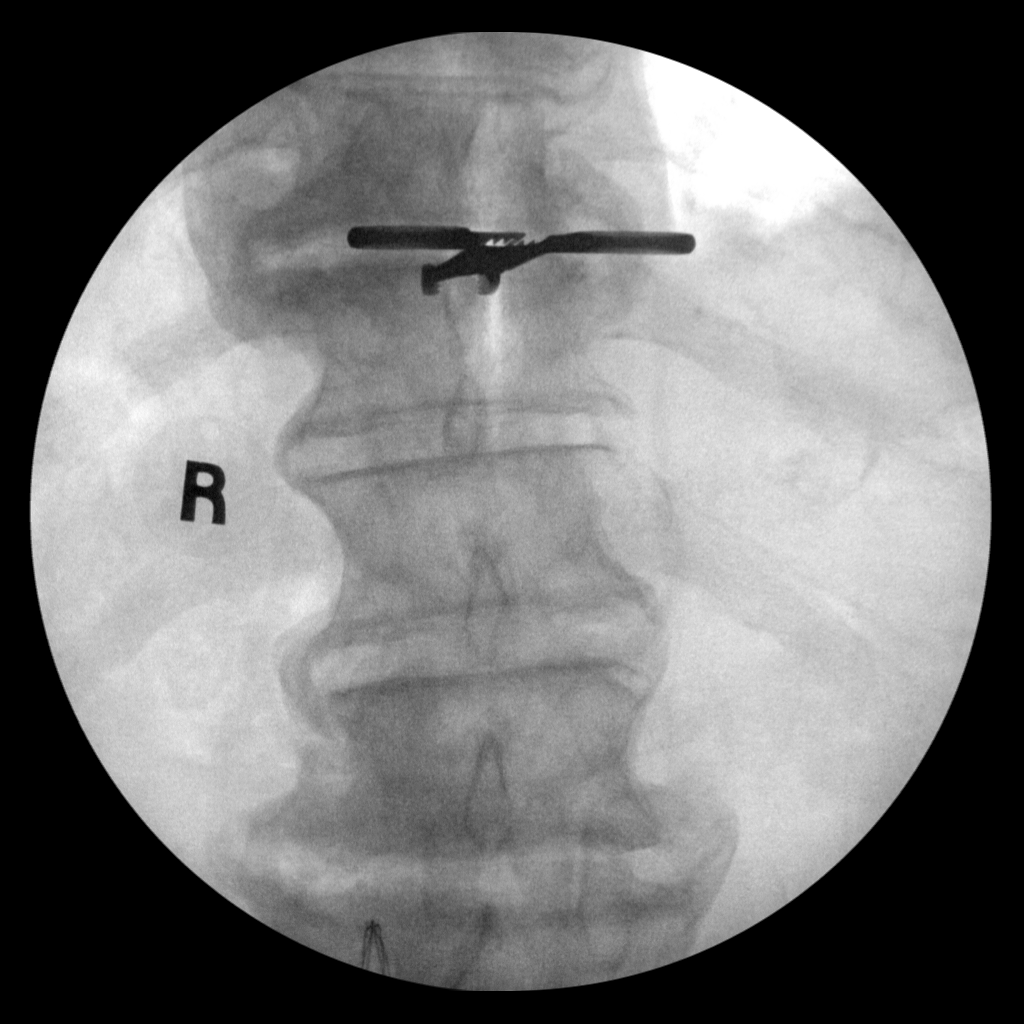
[im 2/2]
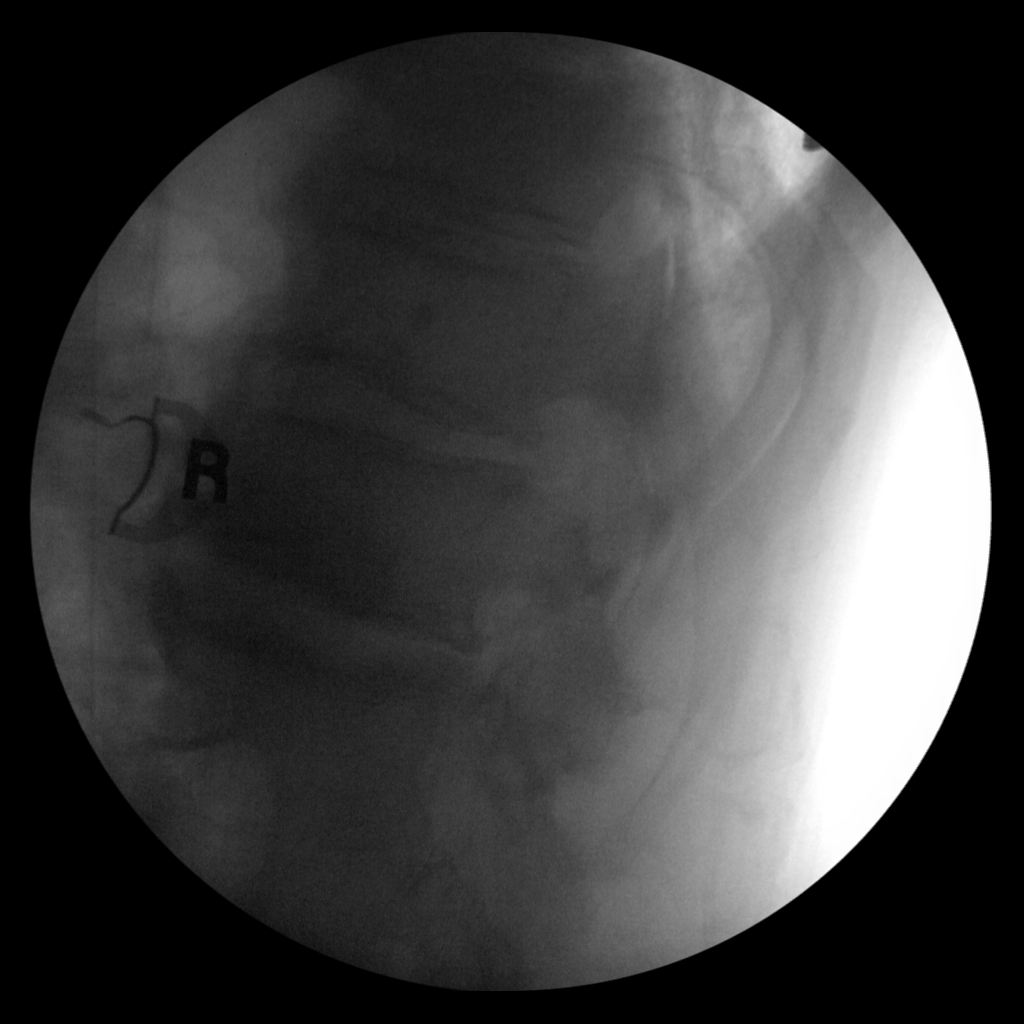

[2 of 2 positions shown; findings below may reference images not displayed]

FINDINGS: 4 C-arm fluoroscopic images were obtained intraoperatively and
submitted for post operative interpretation. Intraoperative
fluoroscopic images demonstrate placement of ACDF hardware at the
C3-4 level. Additional images demonstrate surgical instrumentation
in the lower thoracic spine the T10-11 level. A total of
seconds of fluoroscopy time was utilized. Please see the performing
provider's procedural report for further detail.
IMPRESSION: As above.

## 2020-04-26 IMAGING — RF DG CERVICAL SPINE 2 OR 3 VIEWS
1 series · 3 of 3 positions shown · non-contrast
Comparison: MRI [DATE]

CLINICAL DATA: C3-4 ACDF and T10-11 laminectomy

EXAM:
CERVICAL SPINE - 2-3 VIEW; THORACOLUMBAR SPINE - 2 VIEW; DG C-ARM
1-60 MIN

[Series 1: run · 3 of 3 slices shown]
[im 1/3]
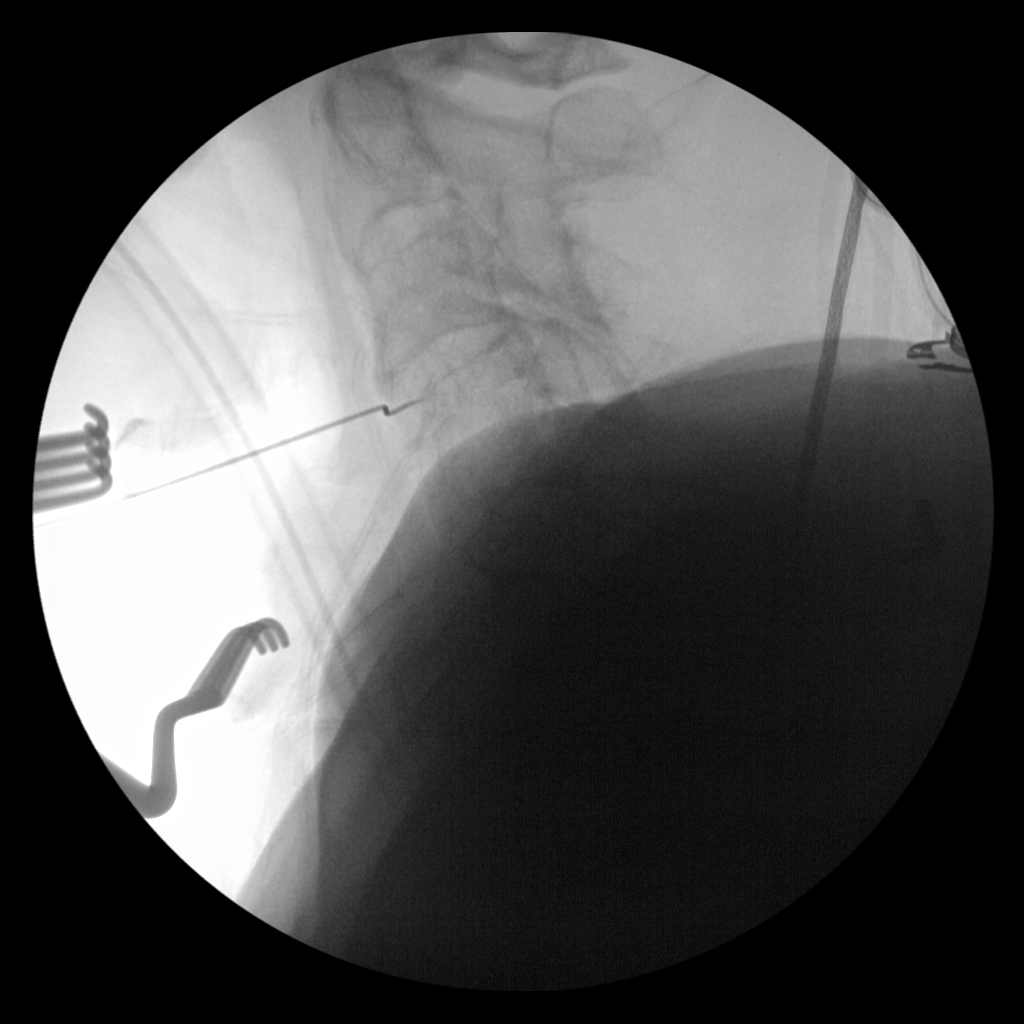
[im 2/3]
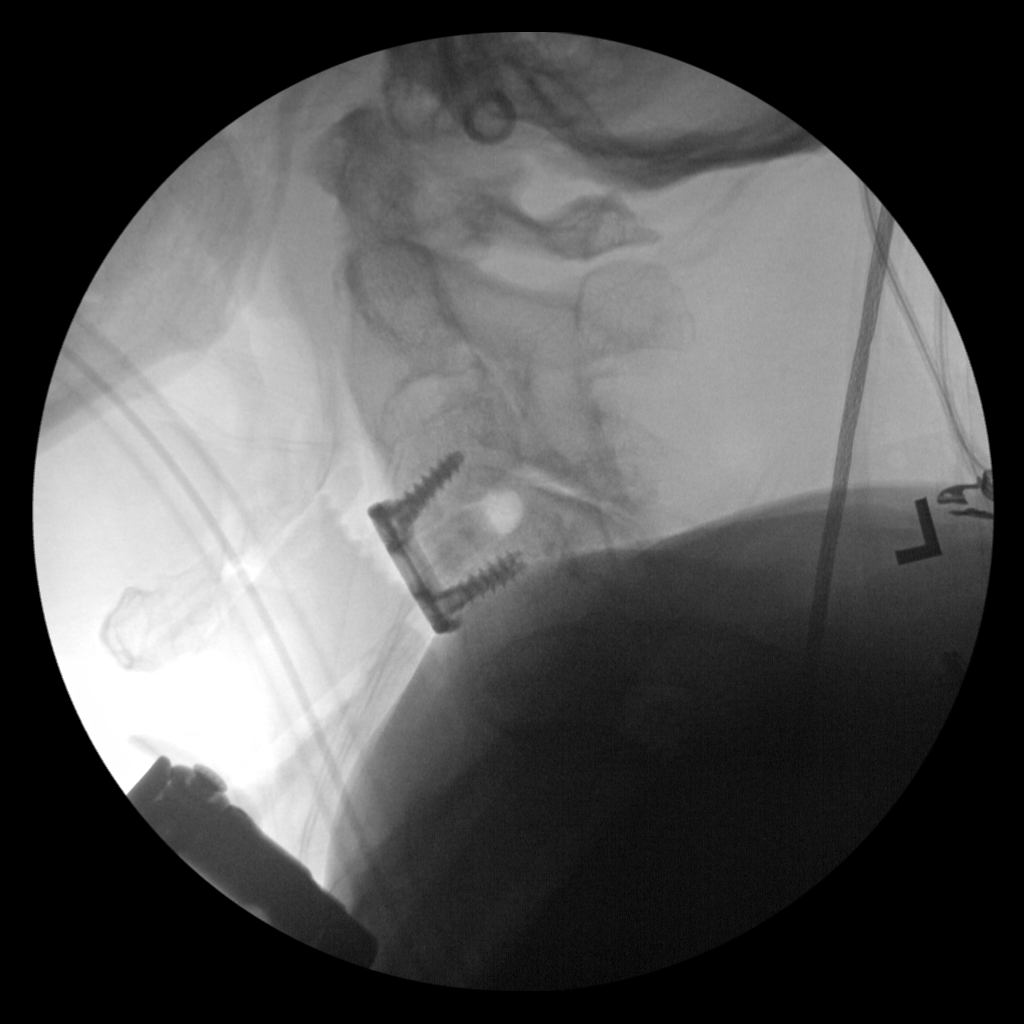
[im 3/3]
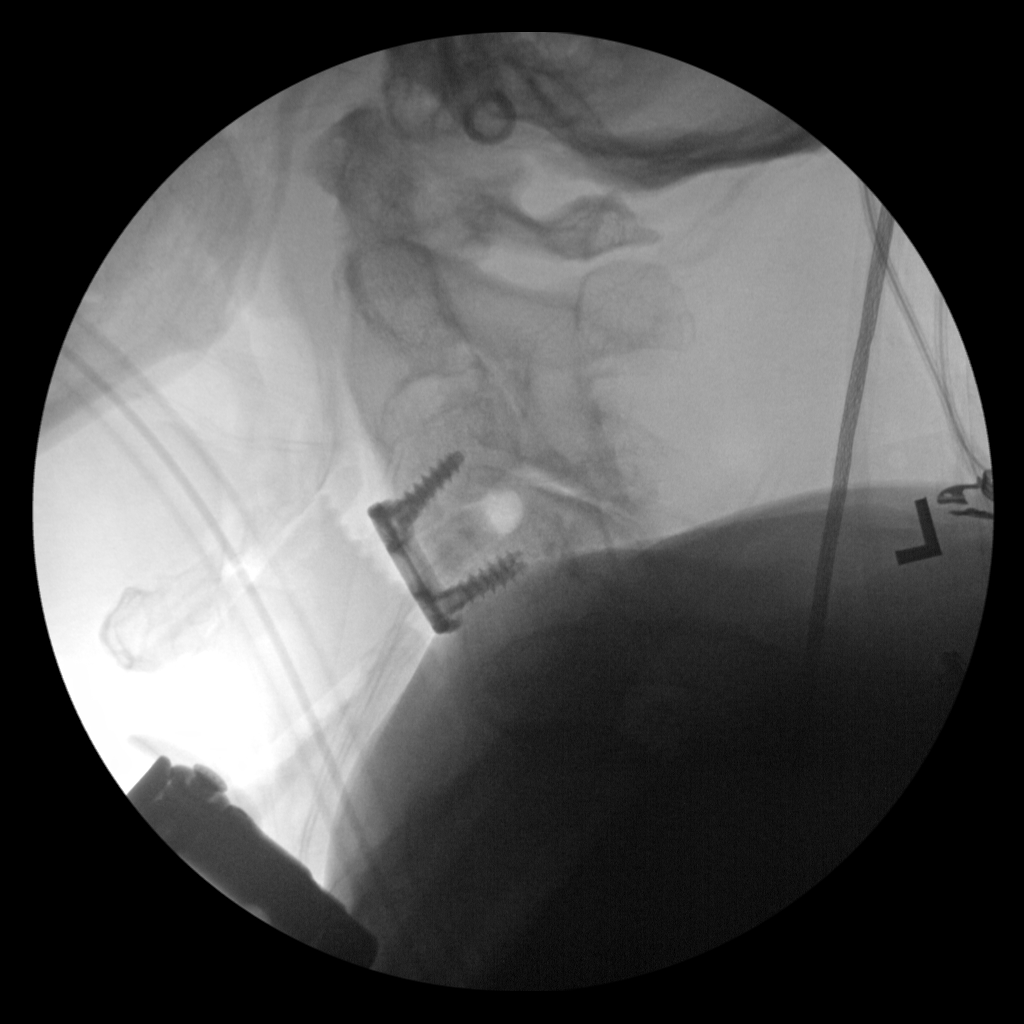

[3 of 3 positions shown; findings below may reference images not displayed]

FINDINGS: 4 C-arm fluoroscopic images were obtained intraoperatively and
submitted for post operative interpretation. Intraoperative
fluoroscopic images demonstrate placement of ACDF hardware at the
C3-4 level. Additional images demonstrate surgical instrumentation
in the lower thoracic spine the T10-11 level. A total of
seconds of fluoroscopy time was utilized. Please see the performing
provider's procedural report for further detail.
IMPRESSION: As above.

## 2020-04-26 SURGERY — ANTERIOR CERVICAL DECOMPRESSION/DISCECTOMY FUSION 1 LEVEL
Anesthesia: General | Site: Spine Lumbar

## 2020-04-26 MED ORDER — BUPIVACAINE HCL (PF) 0.25 % IJ SOLN
INTRAMUSCULAR | Status: AC
  Filled 2020-04-26: qty 30

## 2020-04-26 MED ORDER — VANCOMYCIN HCL IN DEXTROSE 1-5 GM/200ML-% IV SOLN
1000.0000 mg | Freq: Once | INTRAVENOUS | Status: AC
  Administered 2020-04-26: 1000 mg via INTRAVENOUS
  Filled 2020-04-26 (×2): qty 200

## 2020-04-26 MED ORDER — HYDROMORPHONE HCL 1 MG/ML IJ SOLN
INTRAMUSCULAR | Status: AC
  Filled 2020-04-26: qty 0.5

## 2020-04-26 MED ORDER — OXYCODONE HCL 5 MG/5ML PO SOLN: 5.0000 mg | Freq: Once | ORAL | Status: DC | PRN

## 2020-04-26 MED ORDER — METHOCARBAMOL 1000 MG/10ML IJ SOLN
500.0000 mg | Freq: Four times a day (QID) | INTRAVENOUS | Status: DC | PRN
  Filled 2020-04-26: qty 5

## 2020-04-26 MED ORDER — PHENOL 1.4 % MT LIQD: 1.0000 | OROMUCOSAL | Status: DC | PRN

## 2020-04-26 MED ORDER — SODIUM CHLORIDE 0.9% FLUSH
3.0000 mL | Freq: Two times a day (BID) | INTRAVENOUS | Status: DC
  Administered 2020-04-26: 3 mL via INTRAVENOUS

## 2020-04-26 MED ORDER — INSULIN ASPART 100 UNIT/ML ~~LOC~~ SOLN: SUBCUTANEOUS | Status: DC | PRN

## 2020-04-26 MED ORDER — POTASSIUM CHLORIDE IN NACL 20-0.9 MEQ/L-% IV SOLN
INTRAVENOUS | Status: DC
  Filled 2020-04-26 (×2): qty 1000

## 2020-04-26 MED ORDER — THROMBIN 5000 UNITS EX SOLR
CUTANEOUS | Status: AC
  Filled 2020-04-26: qty 5000

## 2020-04-26 MED ORDER — PHENYLEPHRINE HCL-NACL 10-0.9 MG/250ML-% IV SOLN
INTRAVENOUS | Status: AC
  Filled 2020-04-26: qty 500

## 2020-04-26 MED ORDER — ROCURONIUM BROMIDE 10 MG/ML (PF) SYRINGE
PREFILLED_SYRINGE | INTRAVENOUS | Status: AC
  Filled 2020-04-26: qty 10

## 2020-04-26 MED ORDER — ACETAMINOPHEN 650 MG RE SUPP: 650.0000 mg | RECTAL | Status: DC | PRN

## 2020-04-26 MED ORDER — MENTHOL 3 MG MT LOZG: 1.0000 | LOZENGE | OROMUCOSAL | Status: DC | PRN

## 2020-04-26 MED ORDER — ONDANSETRON HCL 4 MG/2ML IJ SOLN: 4.0000 mg | Freq: Four times a day (QID) | INTRAMUSCULAR | Status: DC | PRN

## 2020-04-26 MED ORDER — METHOCARBAMOL 500 MG PO TABS: 500.0000 mg | ORAL_TABLET | Freq: Four times a day (QID) | ORAL | Status: DC | PRN

## 2020-04-26 MED ORDER — PHENYLEPHRINE HCL-NACL 10-0.9 MG/250ML-% IV SOLN
INTRAVENOUS | Status: DC | PRN
  Administered 2020-04-26: 25 ug/min via INTRAVENOUS

## 2020-04-26 MED ORDER — ONDANSETRON HCL 4 MG PO TABS: 4.0000 mg | ORAL_TABLET | Freq: Four times a day (QID) | ORAL | Status: DC | PRN

## 2020-04-26 MED ORDER — PHENYLEPHRINE 40 MCG/ML (10ML) SYRINGE FOR IV PUSH (FOR BLOOD PRESSURE SUPPORT)
PREFILLED_SYRINGE | INTRAVENOUS | Status: DC | PRN
  Administered 2020-04-26: 80 ug via INTRAVENOUS
  Administered 2020-04-26 (×2): 40 ug via INTRAVENOUS

## 2020-04-26 MED ORDER — INSULIN REGULAR BOLUS VIA INFUSION
10.0000 [IU] | Freq: Once | INTRAVENOUS | Status: DC
  Filled 2020-04-26 (×2): qty 10

## 2020-04-26 MED ORDER — VANCOMYCIN HCL 1000 MG IV SOLR
INTRAVENOUS | Status: DC | PRN
  Administered 2020-04-26: 1000 mg via INTRAVENOUS

## 2020-04-26 MED ORDER — THROMBIN 20000 UNITS EX SOLR
CUTANEOUS | Status: AC
  Filled 2020-04-26: qty 20000

## 2020-04-26 MED ORDER — 0.9 % SODIUM CHLORIDE (POUR BTL) OPTIME
TOPICAL | Status: DC | PRN
  Administered 2020-04-26 (×2): 1000 mL

## 2020-04-26 MED ORDER — SUGAMMADEX SODIUM 200 MG/2ML IV SOLN
INTRAVENOUS | Status: DC | PRN
  Administered 2020-04-26: 190 mg via INTRAVENOUS

## 2020-04-26 MED ORDER — ROCURONIUM BROMIDE 10 MG/ML (PF) SYRINGE
PREFILLED_SYRINGE | INTRAVENOUS | Status: DC | PRN
  Administered 2020-04-26: 50 mg via INTRAVENOUS
  Administered 2020-04-26 (×2): 20 mg via INTRAVENOUS

## 2020-04-26 MED ORDER — THROMBIN 5000 UNITS EX SOLR
CUTANEOUS | Status: AC
  Filled 2020-04-26: qty 10000

## 2020-04-26 MED ORDER — OXYCODONE HCL 5 MG PO TABS: 5.0000 mg | ORAL_TABLET | ORAL | Status: DC | PRN

## 2020-04-26 MED ORDER — LIDOCAINE HCL (CARDIAC) PF 100 MG/5ML IV SOSY
PREFILLED_SYRINGE | INTRAVENOUS | Status: DC | PRN
  Administered 2020-04-26: 80 mg via INTRAVENOUS

## 2020-04-26 MED ORDER — THROMBIN 5000 UNITS EX SOLR
OROMUCOSAL | Status: DC | PRN
  Administered 2020-04-26: 5 mL via TOPICAL

## 2020-04-26 MED ORDER — ONDANSETRON HCL 4 MG/2ML IJ SOLN
INTRAMUSCULAR | Status: AC
  Filled 2020-04-26: qty 2

## 2020-04-26 MED ORDER — INSULIN ASPART 100 UNIT/ML ~~LOC~~ SOLN
SUBCUTANEOUS | Status: AC
  Filled 2020-04-26: qty 1

## 2020-04-26 MED ORDER — PROPOFOL 1000 MG/100ML IV EMUL
INTRAVENOUS | Status: AC
  Filled 2020-04-26: qty 200

## 2020-04-26 MED ORDER — FENTANYL CITRATE (PF) 250 MCG/5ML IJ SOLN
INTRAMUSCULAR | Status: DC | PRN
  Administered 2020-04-26 (×2): 50 ug via INTRAVENOUS
  Administered 2020-04-26: 100 ug via INTRAVENOUS
  Administered 2020-04-26: 50 ug via INTRAVENOUS

## 2020-04-26 MED ORDER — PROPOFOL 10 MG/ML IV BOLUS
INTRAVENOUS | Status: DC | PRN
  Administered 2020-04-26: 170 mg via INTRAVENOUS

## 2020-04-26 MED ORDER — PROPOFOL 10 MG/ML IV BOLUS
INTRAVENOUS | Status: AC
  Filled 2020-04-26: qty 20

## 2020-04-26 MED ORDER — DEXAMETHASONE SODIUM PHOSPHATE 10 MG/ML IJ SOLN
INTRAMUSCULAR | Status: DC | PRN
  Administered 2020-04-26: 8 mg via INTRAVENOUS

## 2020-04-26 MED ORDER — HYDROMORPHONE HCL 1 MG/ML IJ SOLN
INTRAMUSCULAR | Status: DC | PRN
  Administered 2020-04-26: .5 mg via INTRAVENOUS

## 2020-04-26 MED ORDER — DEXAMETHASONE SODIUM PHOSPHATE 10 MG/ML IJ SOLN
INTRAMUSCULAR | Status: AC
  Filled 2020-04-26: qty 1

## 2020-04-26 MED ORDER — SENNA 8.6 MG PO TABS
1.0000 | ORAL_TABLET | Freq: Two times a day (BID) | ORAL | Status: DC
  Administered 2020-04-26 – 2020-04-27 (×2): 8.6 mg via ORAL
  Filled 2020-04-26 (×2): qty 1

## 2020-04-26 MED ORDER — PHENYLEPHRINE 40 MCG/ML (10ML) SYRINGE FOR IV PUSH (FOR BLOOD PRESSURE SUPPORT)
PREFILLED_SYRINGE | INTRAVENOUS | Status: AC
  Filled 2020-04-26: qty 10

## 2020-04-26 MED ORDER — SODIUM CHLORIDE 0.9 % IV SOLN
INTRAVENOUS | Status: DC | PRN
  Administered 2020-04-26 (×2): 500 mL

## 2020-04-26 MED ORDER — FENTANYL CITRATE (PF) 100 MCG/2ML IJ SOLN: 25.0000 ug | INTRAMUSCULAR | Status: DC | PRN

## 2020-04-26 MED ORDER — SODIUM CHLORIDE 0.9% FLUSH: 3.0000 mL | INTRAVENOUS | Status: DC | PRN

## 2020-04-26 MED ORDER — ACETAMINOPHEN 325 MG PO TABS
650.0000 mg | ORAL_TABLET | ORAL | Status: DC | PRN
  Administered 2020-04-27: 650 mg via ORAL
  Filled 2020-04-26: qty 2

## 2020-04-26 MED ORDER — THROMBIN 20000 UNITS EX SOLR
CUTANEOUS | Status: DC | PRN
  Administered 2020-04-26: 20 mL via TOPICAL

## 2020-04-26 MED ORDER — SODIUM CHLORIDE 0.9 % IV SOLN: 250.0000 mL | INTRAVENOUS | Status: DC

## 2020-04-26 MED ORDER — INSULIN ASPART 100 UNIT/ML ~~LOC~~ SOLN
0.0000 [IU] | Freq: Three times a day (TID) | SUBCUTANEOUS | Status: DC
  Administered 2020-04-26: 8 [IU] via SUBCUTANEOUS
  Administered 2020-04-27: 5 [IU] via SUBCUTANEOUS
  Administered 2020-04-27: 8 [IU] via SUBCUTANEOUS
  Administered 2020-04-27: 5 [IU] via SUBCUTANEOUS

## 2020-04-26 MED ORDER — MORPHINE SULFATE (PF) 2 MG/ML IV SOLN: 2.0000 mg | INTRAVENOUS | Status: DC | PRN

## 2020-04-26 MED ORDER — BUPIVACAINE HCL (PF) 0.25 % IJ SOLN
INTRAMUSCULAR | Status: DC | PRN
  Administered 2020-04-26: 3 mL
  Administered 2020-04-26: 4 mL

## 2020-04-26 MED ORDER — FENTANYL CITRATE (PF) 250 MCG/5ML IJ SOLN
INTRAMUSCULAR | Status: AC
  Filled 2020-04-26: qty 5

## 2020-04-26 MED ORDER — OXYCODONE HCL 5 MG PO TABS: 5.0000 mg | ORAL_TABLET | Freq: Once | ORAL | Status: DC | PRN

## 2020-04-26 MED ORDER — ONDANSETRON HCL 4 MG/2ML IJ SOLN
INTRAMUSCULAR | Status: DC | PRN
  Administered 2020-04-26: 4 mg via INTRAVENOUS

## 2020-04-26 MED ORDER — LACTATED RINGERS IV SOLN: INTRAVENOUS | Status: DC | PRN

## 2020-04-26 MED ORDER — LIDOCAINE 2% (20 MG/ML) 5 ML SYRINGE
INTRAMUSCULAR | Status: AC
  Filled 2020-04-26: qty 5

## 2020-04-26 SURGICAL SUPPLY — 64 items
BAG DECANTER FOR FLEXI CONT (MISCELLANEOUS) ×8 IMPLANT
BAND RUBBER #18 3X1/16 STRL (MISCELLANEOUS) ×12 IMPLANT
BASKET BONE COLLECTION (BASKET) ×2 IMPLANT
BENZOIN TINCTURE PRP APPL 2/3 (GAUZE/BANDAGES/DRESSINGS) ×8 IMPLANT
BIT DRILL 14MM (INSTRUMENTS) IMPLANT
BUR CARBIDE MATCH 3.0 (BURR) ×8 IMPLANT
CANISTER SUCT 3000ML PPV (MISCELLANEOUS) ×8 IMPLANT
CARTRIDGE OIL MAESTRO DRILL (MISCELLANEOUS) ×4 IMPLANT
CLOSURE WOUND 1/2 X4 (GAUZE/BANDAGES/DRESSINGS) ×2
COVER WAND RF STERILE (DRAPES) ×4 IMPLANT
DERMABOND ADVANCED (GAUZE/BANDAGES/DRESSINGS) ×2
DERMABOND ADVANCED .7 DNX12 (GAUZE/BANDAGES/DRESSINGS) IMPLANT
DIFFUSER DRILL AIR PNEUMATIC (MISCELLANEOUS) ×4 IMPLANT
DRAPE C-ARM 42X72 X-RAY (DRAPES) ×8 IMPLANT
DRAPE LAPAROTOMY 100X72 PEDS (DRAPES) ×4 IMPLANT
DRAPE LAPAROTOMY 100X72X124 (DRAPES) ×4 IMPLANT
DRAPE MICROSCOPE LEICA (MISCELLANEOUS) ×6 IMPLANT
DRAPE SURG 17X23 STRL (DRAPES) ×4 IMPLANT
DRILL 14MM (INSTRUMENTS) ×4
DRSG OPSITE POSTOP 4X6 (GAUZE/BANDAGES/DRESSINGS) ×4 IMPLANT
DURAPREP 26ML APPLICATOR (WOUND CARE) ×4 IMPLANT
DURAPREP 6ML APPLICATOR 50/CS (WOUND CARE) ×4 IMPLANT
ELECT COATED BLADE 2.86 ST (ELECTRODE) ×4 IMPLANT
ELECT REM PT RETURN 9FT ADLT (ELECTROSURGICAL) ×8
ELECTRODE REM PT RTRN 9FT ADLT (ELECTROSURGICAL) ×4 IMPLANT
GAUZE 4X4 16PLY RFD (DISPOSABLE) IMPLANT
GLOVE BIO SURGEON STRL SZ7 (GLOVE) ×4 IMPLANT
GLOVE BIO SURGEON STRL SZ8 (GLOVE) ×8 IMPLANT
GLOVE BIOGEL PI IND STRL 7.0 (GLOVE) IMPLANT
GLOVE BIOGEL PI IND STRL 7.5 (GLOVE) IMPLANT
GLOVE BIOGEL PI INDICATOR 7.0 (GLOVE) ×8
GLOVE BIOGEL PI INDICATOR 7.5 (GLOVE) ×4
GLOVE SURG SS PI 7.0 STRL IVOR (GLOVE) ×12 IMPLANT
GOWN STRL REUS W/ TWL LRG LVL3 (GOWN DISPOSABLE) IMPLANT
GOWN STRL REUS W/ TWL XL LVL3 (GOWN DISPOSABLE) ×2 IMPLANT
GOWN STRL REUS W/TWL 2XL LVL3 (GOWN DISPOSABLE) ×4 IMPLANT
GOWN STRL REUS W/TWL LRG LVL3 (GOWN DISPOSABLE) ×4
GOWN STRL REUS W/TWL XL LVL3 (GOWN DISPOSABLE) ×8
HEMOSTAT POWDER KIT SURGIFOAM (HEMOSTASIS) ×4 IMPLANT
KIT BASIN OR (CUSTOM PROCEDURE TRAY) ×8 IMPLANT
KIT TURNOVER KIT B (KITS) ×6 IMPLANT
NDL HYPO 25X1 1.5 SAFETY (NEEDLE) ×4 IMPLANT
NDL SPNL 20GX3.5 QUINCKE YW (NEEDLE) ×2 IMPLANT
NEEDLE HYPO 25X1 1.5 SAFETY (NEEDLE) ×8 IMPLANT
NEEDLE SPNL 20GX3.5 QUINCKE YW (NEEDLE) ×4 IMPLANT
NS IRRIG 1000ML POUR BTL (IV SOLUTION) ×8 IMPLANT
OIL CARTRIDGE MAESTRO DRILL (MISCELLANEOUS)
PACK LAMINECTOMY NEURO (CUSTOM PROCEDURE TRAY) ×8 IMPLANT
PAD ARMBOARD 7.5X6 YLW CONV (MISCELLANEOUS) ×18 IMPLANT
PIN DISTRACTION 14MM (PIN) ×8 IMPLANT
PLATE 14MM (Plate) ×2 IMPLANT
SCREW CANN 4X16 SS S/DRILL (Screw) ×8 IMPLANT
SPACER ASSEM CERV LORD 8M (Spacer) ×2 IMPLANT
SPONGE INTESTINAL PEANUT (DISPOSABLE) ×4 IMPLANT
SPONGE SURGIFOAM ABS GEL SZ50 (HEMOSTASIS) ×2 IMPLANT
STRIP CLOSURE SKIN 1/2X4 (GAUZE/BANDAGES/DRESSINGS) ×6 IMPLANT
SUT VIC AB 0 CT1 18XCR BRD8 (SUTURE) ×2 IMPLANT
SUT VIC AB 0 CT1 8-18 (SUTURE) ×2
SUT VIC AB 2-0 CP2 18 (SUTURE) ×4 IMPLANT
SUT VIC AB 3-0 SH 8-18 (SUTURE) ×12 IMPLANT
SUT VICRYL 4-0 PS2 18IN ABS (SUTURE) IMPLANT
TOWEL GREEN STERILE (TOWEL DISPOSABLE) ×8 IMPLANT
TOWEL GREEN STERILE FF (TOWEL DISPOSABLE) ×8 IMPLANT
WATER STERILE IRR 1000ML POUR (IV SOLUTION) ×6 IMPLANT

## 2020-04-26 NOTE — Op Note (Signed)
04/26/2020  11:02 AM  PATIENT:  Johnathan Barker  79 y.o. male  PRE-OPERATIVE DIAGNOSIS:  1.  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 Decompressive anterior cervical discectomy C3-4, 2. Anterior cervical arthrodesis C3-4 utilizing a cortical cancellus allograft i, 3. Anterior cervical plating C3-4 utilizing a Alphatec plate  4.  Through a separate incision, thoracic decompressive laminectomy, medial facetectomy foraminotomies T10-11  SURGEON:  Sherley Bounds, MD  ASSISTANTS: Glenford Peers, FNP  ANESTHESIA:   General  EBL: 35 ml  Total I/O In: 1550 [I.V.:1300; IV Piggyback:250] Out: [id number] [Urine:700; Blood:35]  BLOOD ADMINISTERED: none  DRAINS: none  SPECIMEN:  none  INDICATION FOR PROCEDURE: This patient presented with difficulty with gait. Imaging showed severe spinal stenosis C3-4 T10-11 and L1-L2.  The patient was found to be myelopathic on exam.  Recommended ACDF with plating and T10-11 decompressive laminectomy.  Because he was more myelopathic and did not have significant radicular symptoms we decided not to do the L1-2 decompressive laminectomy as we felt this would be "too much surgery in 1 day."  We felt his gait disturbance was related to the cord compression, not the lumbar stenosis. Patient understood the risks, benefits, and alternatives and potential outcomes and wished to proceed.  PROCEDURE DETAILS: Patient was brought to the operating room placed under general endotracheal anesthesia. Patient was placed in the supine position on the operating room table. The neck was prepped with Duraprep and draped in a sterile fashion.   Three cc of local anesthesia was injected and a transverse incision was made on the right side of the neck.  Dissection was carried down thru the subcutaneous tissue and the platysma was  elevated,  opened, and undermined with Metzenbaum scissors.  Dissection was then carried out thru an avascular plane leaving the sternocleidomastoid carotid artery and jugular vein laterally and the trachea and esophagus medially with the assistance of my nurse practitioner. The ventral aspect of the vertebral column was identified and a localizing x-ray was taken. The C3-4 level was identified and all in the room agreed with the level. The longus colli muscles were then elevated and the retractor was placed with the assistance of my nurse practitioner. The annulus was incised and the disc space entered. Discectomy was performed with micro-curettes and pituitary rongeurs. I then used the high-speed drill to drill the endplates down to the level of the posterior longitudinal ligament.  The operating microscope was draped and brought into the field provided additional magnification, illumination and visualization. Discectomy was continued posteriorly thru the disc space. Posterior longitudinal ligament was opened with a nerve hook, and then removed along with disc herniation and osteophytes, decompressing the spinal canal and thecal sac. We then continued to remove osteophytic overgrowth and disc material decompressing the neural foramina and exiting nerve roots bilaterally. The scope was angled up and down to help decompress and undercut the vertebral bodies. Once the decompression was completed we could pass a nerve hook circumferentially to assure adequate decompression in the midline and in the neural foramina.  We could see the spinal cord pulsating through the dura.  So by both visualization and palpation we felt we had an adequate decompression of the neural elements. We then measured the height of the intravertebral disc space and selected a 8 millimeter cortical cancellus allograft. It was then gently positioned in the intravertebral disc space(s) and countersunk.  I then used a 14 mm Alphatec plate and placed 14 mm  variable angle screws into the vertebral bodies of each level and locked them into position. The wound was irrigated with bacitracin solution, checked for hemostasis which was established and confirmed. Once meticulous hemostasis was achieved, we then proceeded with closure with the assistance of my nurse practitioner. The platysma was closed with interrupted 3-0 undyed Vicryl suture, the subcuticular layer was closed with interrupted 3-0 undyed Vicryl suture. The skin edges were approximated with steristrips. The drapes were removed. A sterile dressing was applied. . At the end of the procedure all sponge, needle and instrument counts were correct.  The patient was repositioned for the thoracic decompression.  The patient was rolled into the prone position on chest rolls and all pressure points were padded.  We localized our incision with AP fluoroscopy.  The thoracic and lumbar region was cleaned with Betadine scrub for 5 minutes and then prepped with DuraPrep and draped in the usual sterile fashion. 5 cc of local anesthesia was injected and then a dorsal midline incision was made and carried down to the thoracic fascia overlying T10-T12. The fascia was opened and the paraspinous musculature was taken down in a subperiosteal fashion to expose T10-11 bilaterally. Intraoperative AP and lateral fluoroscopy was used to confirm my level (once again all in the room agreed), and then I removed the spinous process and used a combination of the high-speed drill and the Kerrison punches to perform laminectomy, medial facetectomy, and foraminotomy at T10-11 bilaterally. The underlying yellow ligament, which was severely hypertrophic, was opened and removed in a piecemeal fashion to expose the underlying dura.  Great care was taken to remove it gently and not because downward pressure on the dura.  We drilled the bone down to an eggshell. I undercut the lateral recess and dissected down until I was medial to and distal to  the T11 pedicle. I then palpated with a nerve hook along the nerve root and into the foramen to assure adequate decompression.  The dura was full and capacious all the way across.  I irrigated with saline solution containing bacitracin. Achieved hemostasis with bipolar cautery, lined the dura with Gelfoam, and then closed the fascia with 0 Vicryl. I closed the subcutaneous tissues with 2-0 Vicryl and the subcuticular tissues with 3-0 Vicryl. The skin was then closed with benzoin and Steri-Strips. The drapes were removed, a sterile dressing was applied.  My nurse practitioner was involved in the exposure, safe retraction of the neural elements, the disc work and the closure. the patient was awakened from general anesthesia and transferred to the recovery room in stable condition. At the end of the procedure all sponge, needle and instrument counts were correct.    PLAN OF CARE: Admit to inpatient   PATIENT DISPOSITION:  PACU - hemodynamically stable.   Delay start of Pharmacological VTE agent (>24hrs) due to surgical blood loss or risk of bleeding:  yes

## 2020-04-26 NOTE — Progress Notes (Addendum)
Pharmacy Antibiotic Note  Johnathan Barker is a 79 y.o. male admitted on 04/23/2020 with cervical spondylosis with severe cervical spinal stenosis C3-4, and severe thoracic spinal stenosis T10-11 with myelopathy and gait disturbance. Pt is S/P decompressive anterior cervical discectomy C3-4, anterior cervical arthrodesis C3-4 utilizing a cortical cancellus allograft, anterior cervical plating C3-4 utilizing Alphatec plate, and thoracic decompressive laminectomy, medial facetectomy foraminotomies T10-11 this afternoon. Pharmacy has been consulted for vancomycin dosing for post-op surgical prophylaxis.  Pt rec'd vancomycin 1 gm IV X 1 pre op at 0745 AM today. Per neurosurgeon note, pt has no drains in place post op.  WBC 16.7, afebrile, Scr 1.0, CrCl 67.9 ml/min; pt has hx of hives/rash with penicillins  Plan: Vancomycin 1 gm IV X 1 at 1945 this evening (12 hrs after pre op dose)  Height: 6' (182.9 cm) Weight: 92.3 kg (203 lb 7.9 oz) IBW/kg (Calculated) : 77.6  Temp (24hrs), Avg:97.8 F (36.6 C), Min:97.5 F (36.4 C), Max:98.2 F (36.8 C)  Recent Labs  Lab 04/23/20 2302 04/24/20 0548 04/25/20 0546 04/26/20 0404  WBC 11.7* 13.4* 12.4* 16.7*  CREATININE 0.85 0.81 1.04 1.00    Estimated Creatinine Clearance: 67.9 mL/min (by C-G formula based on SCr of 1 mg/dL).    Allergies  Allergen Reactions  . Penicillins Hives, Rash and Other (See Comments)    FEVER  PATIENT HAS HAD A PCN REACTION WITH IMMEDIATE RASH, FACIAL/TONGUE/THROAT SWELLING, SOB, OR LIGHTHEADEDNESS WITH HYPOTENSION:  #  #  #  YES  #  #  #   Has patient had a PCN reaction causing severe rash involving mucus membranes or skin necrosis: No Has patient had a PCN reaction that required hospitalization: No Has patient had a PCN reaction occurring within the last 10 years: No If all of the above answers are "NO", then may proceed with Cephalosporin use.     Microbiology results: 6/2 MRSA PCR: negative 6/1 COVID:  negative  Thank you for allowing pharmacy to be a part of this patient's care.  Gillermina Hu, PharmD, BCPS, [facility] Clinical Pharmacist 04/26/2020 3:11 PM

## 2020-04-26 NOTE — Progress Notes (Signed)
Patient's wedding ring given to wife, Johnathan Barker at [phone] by Network engineer, Baldo Ash.

## 2020-04-26 NOTE — Anesthesia Procedure Notes (Signed)
Procedure Name: Intubation Date/Time: 04/26/2020 7:39 AM Performed by: Raenette Rover, CRNA Pre-anesthesia Checklist: Patient identified, Emergency Drugs available, Suction available and Patient being monitored Patient Re-evaluated:Patient Re-evaluated prior to induction Oxygen Delivery Method: Circle system utilized Preoxygenation: Pre-oxygenation with 100% oxygen Induction Type: IV induction Ventilation: Mask ventilation without difficulty Laryngoscope Size: Miller and 3 Grade View: Grade I Tube type: Oral Tube size: 7.5 mm Number of attempts: 1 Airway Equipment and Method: Stylet Placement Confirmation: ETT inserted through vocal cords under direct vision,  positive ETCO2 and breath sounds checked- equal and bilateral Secured at: 22 cm Tube secured with: Tape Dental Injury: Teeth and Oropharynx as per pre-operative assessment

## 2020-04-26 NOTE — Transfer of Care (Signed)
Immediate Anesthesia Transfer of Care Note  Patient: Johnathan Barker  Procedure(s) Performed: Cervical Three-Four anterior cervical decompression/discectomy/fusion with plate (N/A Spine Cervical) Thoracic Ten-Eleven Laminectomy (N/A Spine Lumbar)  Patient Location: PACU  Anesthesia Type:General  Level of Consciousness: awake and drowsy  Airway & Oxygen Therapy: Patient Spontanous Breathing and Patient connected to face mask oxygen  Post-op Assessment: Report given to RN and Post -op Vital signs reviewed and stable  Post vital signs: Reviewed and stable  Last Vitals:  Vitals Value Taken Time  BP    Temp    Pulse 65 04/26/20 1115  Resp 15 04/26/20 1115  SpO2 99 % 04/26/20 1115  Vitals shown include unvalidated device data.  Last Pain:  Vitals:   04/26/20 0600  TempSrc:   PainSc: 0-No pain         Complications: No apparent anesthesia complications

## 2020-04-26 NOTE — Evaluation (Signed)
Physical Therapy Evaluation Patient Details Name: Johnathan Barker MRN: [id number] DOB: August 13, 1941 Today's Date: 04/26/2020   History of Present Illness  Pt is a 79 y.o. M with significant PMH of HTN, diabetes mellitus type 2, who presents with increasing weakness and difficulty ambulating. 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 Now s/p decompressive anterior cervical discectomy C3-4, anterior cervical arthrodesis C3-4, and thoracic decompressive laminectomy, medial facetectomy foraminotomies T10-11 04/26/2020.  Clinical Impression  Pt evaluated s/p procedure listed above. Prior to admission, pt lives with his spouse and is ambulatory with a walker. Pt with acute confusion post surgery per family. On PT evaluation, pt with noted short term memory deficits and difficulty following 1 step commands (will likely resolve). Pt presents with good strength in bilateral lower extremities, however, continues with ataxic gait pattern and decreased gross motor coordination (RLE in particular). Sensation/proprioception were difficult to assess given current level of mentation. Pt requiring moderate assist for pre gait training and transfer to chair using a walker. Suspect excellent progress given PLOF, motivation and family support. Highly recommend CIR to address deficits, maximize functional independence and decrease caregiver burden.     Follow Up Recommendations CIR    Equipment Recommendations  None recommended by PT    Recommendations for Other Services OT consult;Rehab consult     Precautions / Restrictions Precautions Precautions: Fall;Back;Other (comment) Precaution Booklet Issued: No Precaution Comments: Ataxic gait, verbally reviewed precautions Restrictions Weight Bearing Restrictions: No      Mobility  Bed Mobility Overal bed mobility: Needs Assistance Bed Mobility: Rolling;Sidelying to Sit Rolling: Min assist Sidelying to sit: Mod assist       General bed mobility comments: Cues for log roll technique, modA for trunk to upright  Transfers Overall transfer level: Needs assistance Equipment used: Rolling walker (2 wheeled) Transfers: Sit to/from [facility] Sit to Stand: Mod assist Stand pivot transfers: Mod assist       General transfer comment: ModA to stand from elevated bed surface x 2. Cues for hand/foot positioning. Pre gait training including weight shifting, forward/backwards, and side stepping. Able to pivot over on second trial to chair with modA. Cues for sequencing, direction. Erratic placement of R foot and ataxic gait.  Ambulation/Gait                Stairs            Wheelchair Mobility    Modified Rankin (Stroke Patients Only)       Balance Overall balance assessment: Needs assistance Sitting-balance support: Feet supported Sitting balance-Leahy Scale: Good     Standing balance support: Bilateral upper extremity supported Standing balance-Leahy Scale: Poor                               Pertinent Vitals/Pain Pain Assessment: Faces Faces Pain Scale: Hurts little more Pain Location: thoracic Pain Descriptors / Indicators: Grimacing;Guarding Pain Intervention(s): Limited activity within patient's tolerance;Monitored during session;Repositioned    Home Living Family/patient expects to be discharged to:: Private residence Living Arrangements: Spouse/significant other Available Help at Discharge: Family;Available 24 hours/day Type of Home: House Home Access: Stairs to enter Entrance Stairs-Rails: Left Entrance Stairs-Number of Steps: 3 Home Layout: One level Home Equipment: Walker - 2 wheels;Cane - single point;Wheelchair - manual;Shower seat      Prior Function Level of Independence: Needs assistance   Gait / Transfers Assistance Needed: uses walker  ADL's / Homemaking Assistance Needed:  intermittent assist with ADL's  Comments: Reports 4 falls in  past several months     Hand Dominance        Extremity/Trunk Assessment   Upper Extremity Assessment Upper Extremity Assessment: Overall WFL for tasks assessed    Lower Extremity Assessment Lower Extremity Assessment: RLE deficits/detail;LLE deficits/detail RLE Deficits / Details: Strength 5/5 RLE Coordination: decreased gross motor LLE Deficits / Details: Strength 5/5 LLE Coordination: decreased gross motor    Cervical / Trunk Assessment Cervical / Trunk Assessment: Other exceptions Cervical / Trunk Exceptions: s/p ACDF, thoracic laminectomy  Communication   Communication: No difficulties  Cognition Arousal/Alertness: Awake/alert Behavior During Therapy: WFL for tasks assessed/performed Overall Cognitive Status: Impaired/Different from baseline Area of Impairment: Memory;Following commands;Awareness;Problem solving                     Memory: Decreased short-term memory Following Commands: Follows one step commands inconsistently   Awareness: Intellectual Problem Solving: Difficulty sequencing;Requires verbal cues General Comments: Acute confusion post surgery according to family. Pt with STM deficits noted, often telling me that there is another surgery planned for his back (which is inaccurate). Also difficulty following 1 step commands, i.e. repeatedly trying to get out of bed on the right side when rail was up and we were attempting to get out towards the left.       General Comments      Exercises     Assessment/Plan    PT Assessment Patient needs continued PT services  PT Problem List Decreased balance;Decreased mobility;Decreased coordination;Decreased cognition;Decreased safety awareness;Pain       PT Treatment Interventions DME instruction;Gait training;Stair training;Functional mobility training;Therapeutic activities;Therapeutic exercise;Balance training;Patient/family education    PT Goals (Current goals can be found in the Care Plan  section)  Acute Rehab PT Goals Patient Stated Goal: "go to intensive rehab." PT Goal Formulation: With patient Time For Goal Achievement: 05/10/20 Potential to Achieve Goals: Good    Frequency Min 5X/week   Barriers to discharge        Co-evaluation               AM-PAC PT "6 Clicks" Mobility  Outcome Measure Help needed turning from your back to your side while in a flat bed without using bedrails?: A Little Help needed moving from lying on your back to sitting on the side of a flat bed without using bedrails?: A Lot Help needed moving to and from a bed to a chair (including a wheelchair)?: A Lot Help needed standing up from a chair using your arms (e.g., wheelchair or bedside chair)?: A Lot Help needed to walk in hospital room?: A Lot Help needed climbing 3-5 steps with a railing? : A Lot 6 Click Score: 13    End of Session Equipment Utilized During Treatment: Gait belt Activity Tolerance: Patient tolerated treatment well Patient left: in chair;with call bell/phone within reach;with chair alarm set;with family/visitor present Nurse Communication: Mobility status PT Visit Diagnosis: Pain;Difficulty in walking, not elsewhere classified (R26.2);Ataxic gait (R26.0) Pain - part of body: (back)    Time: [id number] PT Time Calculation (min) (ACUTE ONLY): 32 min   Charges:   PT Evaluation $PT Eval Moderate Complexity: 1 Mod PT Treatments $Gait Training: 8-22 mins          Wyona Almas, PT, DPT Acute Rehabilitation Services Pager [phone] Office [phone]   Deno Etienne 04/26/2020, 5:19 PM

## 2020-04-26 NOTE — Progress Notes (Signed)
Inpatient Rehab Admissions:  Inpatient Rehab Consult received.  Await therapy evaluations for d/c recommendations.  Will f/u tomorrow.   Signed: Shann Medal, PT, DPT Admissions Coordinator [phone] 04/26/20  3:53 PM

## 2020-04-26 NOTE — Progress Notes (Addendum)
PROGRESS NOTE    Johnathan Barker  [id number] DOB: 1941-10-31 DOA: 04/23/2020 PCP: Nicoletta Dress, MD    Brief Narrative:  79 y.o. male with history of hypertension diabetes mellitus type 2 hyperlipidemia who was recently started on Lantus insulin 2 months ago presents to the ER because of increasing weakness over the last 4 to 5 days.  Weakness is mostly in the right lower extremity.  Has difficulty ambulating.  Patient states over the last 2 months patient has been using walker to walk.  Denies any incontinence of urine tingling or numbness or any difficulty swallowing speaking or visual symptoms.  Has no weakness of the upper extremity.  Assessment & Plan:   Principal Problem:   Weakness of right lower extremity Active Problems:   Essential hypertension   Diabetes mellitus type 2 in nonobese (HCC)   Cord compression (HCC)   Myelopathy ([place])  1. Right lower extremity weakness with difficulty ambulating and ataxia 1. 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 Neurosurgery consulted and pt underwent surgery 6/3 3. Inpt rehab consulted, pending 2. Diabetes mellitus type 2 uncontrolled  1. recently started on Lantus insulin which will be continued.   2. -cont SSI coverage as needed 3. Hemoglobin A1c noted to be 7.5 3. Hypertension on ARB amlodipine and beta-blockers. 1. BP stable currently 2. Cont current regimen 4. Hyperlipidemia  1. Continue on statins as tolerated  DVT prophylaxis: SCD's Code Status: Full Family Communication: Pt in room, family currently at bedside  Status is: Inpatient  Remains inpatient appropriate because:Ongoing diagnostic testing needed not appropriate for outpatient work up   Dispo: The patient is from: Home              Anticipated d/c is to: depending on PT eval post-op  Anticipated d/c date is: 2 days              Patient currently is not medically stable to d/c.   Consultants:   Neurosurgery  Procedures:     Antimicrobials: Anti-infectives (From admission, onward)   Start     Dose/Rate Route Frequency Ordered Stop   04/26/20 1945  vancomycin (VANCOCIN) IVPB 1000 mg/200 mL premix     1,000 mg 200 mL/hr over 60 Minutes Intravenous  Once 04/26/20 1519     04/26/20 0828  bacitracin 50,000 Units in sodium chloride 0.9 % 500 mL irrigation  Status:  Discontinued       As needed 04/26/20 0828 04/26/20 1108   04/26/20 0700  vancomycin (VANCOCIN) IVPB 1000 mg/200 mL premix  Status:  Discontinued     1,000 mg 200 mL/hr over 60 Minutes Intravenous To ShortStay Surgical 04/25/20 1936 04/26/20 1446      Subjective: Confused this afternoon post-op  Objective: Vitals:   04/26/20 1357 04/26/20 1400 04/26/20 1430 04/26/20 1518  BP: (!) 157/80  (!) 162/79 (!) 172/83  Pulse: 81 85 81 96  Resp: 20 (!) 25 (!) 31 20  Temp:   97.8 F (36.6 C) 97.9 F (36.6 C)  TempSrc:    Oral  SpO2: 100% 100% 99% 97%  Weight:      Height:        Intake/Output Summary (Last 24 hours) at 04/26/2020 1720 Last data filed at 04/26/2020 1430 Gross per 24 hour  Intake 2310 ml  Output 2385 ml  Net -75 ml   Filed Weights   04/23/20 2124 04/24/20 1413 04/25/20 2106  Weight: 95.3 kg 92.3 kg 92.3 kg    Examination: General exam: Awake, sitting in chair Respiratory system: Normal respiratory effort, no wheezing Cardiovascular system: regular rate, s1, s2 Gastrointestinal system: Soft, nondistended, positive BS Central nervous system: CN2-12 grossly intact, strength intact Extremities: Perfused, no clubbing Skin: Normal skin turgor, no notable skin lesions seen Psychiatry: confused, anxious  Data Reviewed: I have personally reviewed following labs and imaging studies  CBC: Recent Labs  Lab 04/23/20 2302 04/24/20 0548 04/25/20 0546 04/26/20 0404  WBC 11.7* 13.4*  12.4* 16.7*  NEUTROABS 8.2*  --   --   --   HGB 13.2 13.7 15.0 13.9  HCT 39.1 40.0 44.3 41.0  MCV 89.9 89.5 89.3 88.7  PLT 330 332 392 [id number]   Basic Metabolic Panel: Recent Labs  Lab 04/23/20 2302 04/24/20 0240 04/24/20 0548 04/25/20 0546 04/26/20 0404  NA 139  --  140 137 135  K 3.9  --  3.8 4.9 4.0  CL 104  --  105 103 103  CO2 25  --  27 24 24   GLUCOSE 255*  --  216* 320* 417*  BUN 18  --  14 14 24*  CREATININE 0.85  --  0.81 1.04 1.00  CALCIUM 8.5*  --  8.6* 9.5 8.9  MG  --  1.8  --   --   --    GFR: Estimated Creatinine Clearance: 67.9 mL/min (by C-G formula based on SCr of 1 mg/dL). Liver Function Tests: Recent Labs  Lab 04/23/20 2302 04/25/20 0546 04/26/20 0404  AST 20 19 18   ALT 19 17 15   ALKPHOS 91 103 91  BILITOT 1.5* 2.1* 1.5*  PROT 6.6 7.2 6.5  ALBUMIN 3.6 3.5 3.1*   No results for input(s): LIPASE, AMYLASE in the last 168 hours. No results for input(s): AMMONIA in the last 168 hours. Coagulation Profile: No results for input(s): INR, PROTIME in the last 168 hours. Cardiac Enzymes: No results for input(s): CKTOTAL, CKMB, CKMBINDEX, TROPONINI in the last 168 hours. BNP (last 3 results) No results for input(s): PROBNP in the last 8760 hours. HbA1C: Recent Labs    04/24/20 0548  HGBA1C 7.5*   CBG: Recent Labs  Lab 04/26/20 0935 04/26/20 1043 04/26/20 1114 04/26/20 1347 04/26/20 1603  GLUCAP 282* 235* 218* 223* 269*   Lipid Profile: No results for input(s): CHOL, HDL, LDLCALC, TRIG, CHOLHDL, LDLDIRECT in the last 72 hours. Thyroid Function Tests: No results for input(s): TSH, T4TOTAL, FREET4, T3FREE, THYROIDAB in the last 72 hours. Anemia Panel: Recent Labs    04/24/20 0240  VITAMINB12 545   Sepsis Labs: No results for input(s): PROCALCITON, LATICACIDVEN in the last 168 hours.  Recent Results (from the past 240 hour(s))  Urine culture     Status: None   Collection Time: 04/23/20 10:39 PM   Specimen: Urine, Clean Catch  Result  Value Ref Range Status   Specimen Description   Final    URINE, CLEAN CATCH Performed at [facility], [place] [address] [place], [place] [address]    Special Requests   Final    NONE Performed at [facility], [place] [address] [place], [place] [address]    Culture   Final    NO GROWTH Performed at [facility], [place] [address], [place], [place] [address]    Report Status 04/25/2020 FINAL  Final  SARS Coronavirus 2  by RT PCR (hospital order, performed in [facility] hospital lab) Nasopharyngeal Nasopharyngeal Swab     Status: None   Collection Time: 04/24/20  6:45 AM   Specimen: Nasopharyngeal Swab  Result Value Ref Range Status   SARS Coronavirus 2 NEGATIVE NEGATIVE Final    Comment: (NOTE) SARS-CoV-2 target nucleic acids are NOT DETECTED. The SARS-CoV-2 RNA is generally detectable in upper and lower respiratory specimens during the acute phase of infection. The lowest concentration of SARS-CoV-2 viral copies this assay can detect is 250 copies / mL. A negative result does not preclude SARS-CoV-2 infection and should not be used as the sole basis for treatment or other patient management decisions.  A negative result may occur with improper specimen collection / handling, submission of specimen other than nasopharyngeal swab, presence of viral mutation(s) within the areas targeted by this assay, and inadequate number of viral copies (<250 copies / mL). A negative result must be combined with clinical observations, patient history, and epidemiological information. Fact Sheet for Patients:   StrictlyIdeas.no Fact Sheet for Healthcare Providers: BankingDealers.co.za This test is not yet approved or cleared  by the [place] FDA and has been authorized for detection and/or diagnosis of SARS-CoV-2 by FDA under an Emergency Use Authorization (EUA).  This EUA will remain in effect (meaning this  test can be used) for the duration of the COVID-19 declaration under Section 564(b)(1) of the Act, 21 U.S.C. section 360bbb-3(b)(1), unless the authorization is terminated or revoked sooner. Performed at [facility], [place] [address] [place], [place] [address]   Surgical pcr screen     Status: None   Collection Time: 04/25/20 10:39 PM   Specimen: Nasal Mucosa; Nasal Swab  Result Value Ref Range Status   MRSA, PCR NEGATIVE NEGATIVE Final   Staphylococcus aureus NEGATIVE NEGATIVE Final    Comment: (NOTE) The Xpert SA Assay (FDA approved for NASAL specimens in patients 70 years of age and older), is one component of a comprehensive surveillance program. It is not intended to diagnose infection nor to guide or monitor treatment. Performed at [facility], [place] [address] [place], [place] [address]      Radiology Studies: DG Cervical Spine 2-3 Views  Result Date: 04/26/2020 CLINICAL DATA:  C3-4 ACDF and T10-11 laminectomy EXAM: CERVICAL SPINE - 2-3 VIEW; THORACOLUMBAR SPINE - 2 VIEW; DG C-ARM 1-60 MIN COMPARISON:  MRI 04/24/2020 FINDINGS: 4 C-arm fluoroscopic images were obtained intraoperatively and submitted for post operative interpretation. Intraoperative fluoroscopic images demonstrate placement of ACDF hardware at the C3-4 level. Additional images demonstrate surgical instrumentation in the lower thoracic spine the T10-11 level. A total of 24.5 seconds of fluoroscopy time was utilized. Please see the performing provider's procedural report for further detail. IMPRESSION: As above. Electronically Signed   By: Davina Poke D.O.   On: 04/26/2020 13:20   DG THORACOLUMABAR SPINE  Result Date: 04/26/2020 CLINICAL DATA:  C3-4 ACDF and T10-11 laminectomy EXAM: CERVICAL SPINE - 2-3 VIEW; THORACOLUMBAR SPINE - 2 VIEW; DG C-ARM 1-60 MIN COMPARISON:  MRI 04/24/2020 FINDINGS: 4 C-arm fluoroscopic images were obtained intraoperatively and submitted for post operative  interpretation. Intraoperative fluoroscopic images demonstrate placement of ACDF hardware at the C3-4 level. Additional images demonstrate surgical instrumentation in the lower thoracic spine the T10-11 level. A total of 24.5 seconds of fluoroscopy time was utilized. Please see the performing provider's procedural report for further detail. IMPRESSION: As above. Electronically Signed   By: Davina Poke D.O.   On: 04/26/2020  13:20   DG C-Arm 1-60 Min  Result Date: 04/26/2020 CLINICAL DATA:  C3-4 ACDF and T10-11 laminectomy EXAM: CERVICAL SPINE - 2-3 VIEW; THORACOLUMBAR SPINE - 2 VIEW; DG C-ARM 1-60 MIN COMPARISON:  MRI 04/24/2020 FINDINGS: 4 C-arm fluoroscopic images were obtained intraoperatively and submitted for post operative interpretation. Intraoperative fluoroscopic images demonstrate placement of ACDF hardware at the C3-4 level. Additional images demonstrate surgical instrumentation in the lower thoracic spine the T10-11 level. A total of 24.5 seconds of fluoroscopy time was utilized. Please see the performing provider's procedural report for further detail. IMPRESSION: As above. Electronically Signed   By: Davina Poke D.O.   On: 04/26/2020 13:20    Scheduled Meds: . amLODipine  5 mg Oral QHS  . insulin aspart  0-15 Units Subcutaneous TID WC  . irbesartan  300 mg Oral Daily  . metoprolol succinate  50 mg Oral QAC breakfast  . senna  1 tablet Oral BID  . sodium chloride flush  3 mL Intravenous Q12H   Continuous Infusions: . sodium chloride    . 0.9 % NaCl with KCl 20 mEq / L 75 mL/hr at 04/26/20 1644  . methocarbamol (ROBAXIN) IV    . vancomycin       LOS: 2 days   Marylu Lund, MD [facility] Pager On Amion  If 7PM-7AM, please contact night-coverage 04/26/2020, 5:20 PM

## 2020-04-26 NOTE — Anesthesia Preprocedure Evaluation (Signed)
Anesthesia Evaluation  Patient identified by MRN, date of birth, ID band Patient awake    Reviewed: Allergy & Precautions, H&P , NPO status , Patient's Chart, lab work & pertinent test results  Airway Mallampati: II   Neck ROM: full    Dental   Pulmonary neg pulmonary ROS,    breath sounds clear to auscultation       Cardiovascular hypertension,  Rhythm:regular Rate:Normal     Neuro/Psych    GI/Hepatic   Endo/Other  diabetes, Poorly Controlled, Type 2  Renal/GU stones     Musculoskeletal  (+) Arthritis ,   Abdominal   Peds  Hematology   Anesthesia Other Findings   Reproductive/Obstetrics                             Anesthesia Physical Anesthesia Plan  ASA: III  Anesthesia Plan: General   Post-op Pain Management:    Induction: Intravenous  PONV Risk Score and Plan: 2 and Ondansetron, Dexamethasone, Midazolam and Treatment may vary due to age or medical condition  Airway Management Planned: Oral ETT  Additional Equipment:   Intra-op Plan:   Post-operative Plan: Extubation in OR  Informed Consent: I have reviewed the patients History and Physical, chart, labs and discussed the procedure including the risks, benefits and alternatives for the proposed anesthesia with the patient or authorized representative who has indicated his/her understanding and acceptance.       Plan Discussed with: CRNA, Anesthesiologist and Surgeon  Anesthesia Plan Comments:         Anesthesia Quick Evaluation

## 2020-04-26 NOTE — Progress Notes (Signed)
Inpatient Diabetes Program Recommendations  AACE/ADA: New Consensus Statement on Inpatient Glycemic Control (2015)  Target Ranges:  Prepandial:   less than 140 mg/dL      Peak postprandial:   less than 180 mg/dL (1-2 hours)      Critically ill patients:  140 - 180 mg/dL   Results for ALARIK, HYPOLITE (MRN [id number]) as of 04/26/2020 09:27  Ref. Range 04/25/2020 06:39 04/25/2020 11:05 04/25/2020 16:16 04/25/2020 21:06  Glucose-Capillary Latest Ref Range: 70 - 99 mg/dL 294 (H)  5 units NOVOLOG given at 8:25am  337 (H)  7 units NOVOLOG given at 12:21pm +  15 units LANTUS 296 (H)  5 units NOVOLOG  376 (H)   Results for KAMI, ROTAR (MRN [id number]) as of 04/26/2020 09:27  Ref. Range 04/26/2020 05:34 04/26/2020 07:23  Glucose-Capillary Latest Ref Range: 70 - 99 mg/dL 364 (H) 328 (H)      Home DM Meds: Basaglar 200 units QHS       Amaryl 4 mg BID  Current Orders: Lantus 15 units Daily      Novolog Sensitive Correction Scale/ SSI (0-9 units) TID AC      MD- Note patient getting Decadron 4 mg Q6 hours.  NPO this AM for Back Surgery.  Please consider the following insulin adjustments post-op today:  1. Increase Lantus to 30 units Daily (~0.35 units/kg)  2. Start Novolog Meal Coverage once pt resumes PO diet today: Novolog 6 units TID with meals  (Please add the following Hold Parameters: Hold if pt eats <50% of meal, Hold if pt NPO)      --Will follow patient during hospitalization--  Wyn Quaker RN, MSN, CDE Diabetes Coordinator Inpatient Glycemic Control Team Team Pager: [phone] (8a-5p)

## 2020-04-26 NOTE — Progress Notes (Signed)
Pt to OR.

## 2020-04-27 ENCOUNTER — Encounter: Payer: Self-pay | Admitting: *Deleted

## 2020-04-27 ENCOUNTER — Other Ambulatory Visit: Payer: Self-pay

## 2020-04-27 ENCOUNTER — Inpatient Hospital Stay (HOSPITAL_COMMUNITY): Payer: Medicare Other

## 2020-04-27 ENCOUNTER — Inpatient Hospital Stay (HOSPITAL_COMMUNITY)
Admission: RE | Admit: 2020-04-27 | Discharge: 2020-05-18 | DRG: 559 | Disposition: A | Discharge status: Home Health | Payer: Medicare Other | Source: Intra-hospital | Attending: Physical Medicine & Rehabilitation | Admitting: Physical Medicine & Rehabilitation

## 2020-04-27 DIAGNOSIS — K5900 Constipation, unspecified: Secondary | ICD-10-CM | POA: Diagnosis present

## 2020-04-27 DIAGNOSIS — G9341 Metabolic encephalopathy: Secondary | ICD-10-CM | POA: Diagnosis present

## 2020-04-27 DIAGNOSIS — Z09 Encounter for follow-up examination after completed treatment for conditions other than malignant neoplasm: Secondary | ICD-10-CM

## 2020-04-27 DIAGNOSIS — S14109S Unspecified injury at unspecified level of cervical spinal cord, sequela: Secondary | ICD-10-CM | POA: Diagnosis not present

## 2020-04-27 DIAGNOSIS — R739 Hyperglycemia, unspecified: Secondary | ICD-10-CM

## 2020-04-27 DIAGNOSIS — E041 Nontoxic single thyroid nodule: Secondary | ICD-10-CM | POA: Diagnosis present

## 2020-04-27 DIAGNOSIS — R41841 Cognitive communication deficit: Secondary | ICD-10-CM | POA: Diagnosis not present

## 2020-04-27 DIAGNOSIS — Z4789 Encounter for other orthopedic aftercare: Principal | ICD-10-CM

## 2020-04-27 DIAGNOSIS — T380X5A Adverse effect of glucocorticoids and synthetic analogues, initial encounter: Secondary | ICD-10-CM

## 2020-04-27 DIAGNOSIS — R4689 Other symptoms and signs involving appearance and behavior: Secondary | ICD-10-CM | POA: Diagnosis not present

## 2020-04-27 DIAGNOSIS — Z96653 Presence of artificial knee joint, bilateral: Secondary | ICD-10-CM | POA: Diagnosis present

## 2020-04-27 DIAGNOSIS — I10 Essential (primary) hypertension: Secondary | ICD-10-CM

## 2020-04-27 DIAGNOSIS — G8918 Other acute postprocedural pain: Secondary | ICD-10-CM | POA: Diagnosis not present

## 2020-04-27 DIAGNOSIS — R509 Fever, unspecified: Secondary | ICD-10-CM | POA: Diagnosis not present

## 2020-04-27 DIAGNOSIS — A419 Sepsis, unspecified organism: Secondary | ICD-10-CM

## 2020-04-27 DIAGNOSIS — R4587 Impulsiveness: Secondary | ICD-10-CM | POA: Diagnosis not present

## 2020-04-27 DIAGNOSIS — D72828 Other elevated white blood cell count: Secondary | ICD-10-CM

## 2020-04-27 DIAGNOSIS — E785 Hyperlipidemia, unspecified: Secondary | ICD-10-CM | POA: Diagnosis present

## 2020-04-27 DIAGNOSIS — G959 Disease of spinal cord, unspecified: Secondary | ICD-10-CM

## 2020-04-27 DIAGNOSIS — J849 Interstitial pulmonary disease, unspecified: Secondary | ICD-10-CM | POA: Diagnosis present

## 2020-04-27 DIAGNOSIS — R059 Cough, unspecified: Secondary | ICD-10-CM

## 2020-04-27 DIAGNOSIS — R1313 Dysphagia, pharyngeal phase: Secondary | ICD-10-CM | POA: Diagnosis present

## 2020-04-27 DIAGNOSIS — R410 Disorientation, unspecified: Secondary | ICD-10-CM | POA: Diagnosis not present

## 2020-04-27 DIAGNOSIS — Z781 Physical restraint status: Secondary | ICD-10-CM | POA: Diagnosis not present

## 2020-04-27 DIAGNOSIS — F09 Unspecified mental disorder due to known physiological condition: Secondary | ICD-10-CM | POA: Diagnosis present

## 2020-04-27 DIAGNOSIS — R1310 Dysphagia, unspecified: Secondary | ICD-10-CM | POA: Diagnosis not present

## 2020-04-27 DIAGNOSIS — R05 Cough: Secondary | ICD-10-CM | POA: Diagnosis not present

## 2020-04-27 DIAGNOSIS — R7881 Bacteremia: Secondary | ICD-10-CM | POA: Diagnosis not present

## 2020-04-27 DIAGNOSIS — R29898 Other symptoms and signs involving the musculoskeletal system: Secondary | ICD-10-CM

## 2020-04-27 DIAGNOSIS — J690 Pneumonitis due to inhalation of food and vomit: Secondary | ICD-10-CM

## 2020-04-27 DIAGNOSIS — E119 Type 2 diabetes mellitus without complications: Secondary | ICD-10-CM

## 2020-04-27 DIAGNOSIS — K5903 Drug induced constipation: Secondary | ICD-10-CM

## 2020-04-27 DIAGNOSIS — E1165 Type 2 diabetes mellitus with hyperglycemia: Secondary | ICD-10-CM

## 2020-04-27 DIAGNOSIS — S14109A Unspecified injury at unspecified level of cervical spinal cord, initial encounter: Secondary | ICD-10-CM | POA: Diagnosis present

## 2020-04-27 DIAGNOSIS — R32 Unspecified urinary incontinence: Secondary | ICD-10-CM | POA: Diagnosis present

## 2020-04-27 DIAGNOSIS — R9389 Abnormal findings on diagnostic imaging of other specified body structures: Secondary | ICD-10-CM | POA: Diagnosis not present

## 2020-04-27 DIAGNOSIS — R339 Retention of urine, unspecified: Secondary | ICD-10-CM

## 2020-04-27 DIAGNOSIS — G9520 Unspecified cord compression: Secondary | ICD-10-CM

## 2020-04-27 DIAGNOSIS — R062 Wheezing: Secondary | ICD-10-CM

## 2020-04-27 DIAGNOSIS — D72829 Elevated white blood cell count, unspecified: Secondary | ICD-10-CM

## 2020-04-27 DIAGNOSIS — Z4682 Encounter for fitting and adjustment of non-vascular catheter: Secondary | ICD-10-CM | POA: Diagnosis not present

## 2020-04-27 DIAGNOSIS — Z794 Long term (current) use of insulin: Secondary | ICD-10-CM

## 2020-04-27 DIAGNOSIS — N200 Calculus of kidney: Secondary | ICD-10-CM | POA: Diagnosis not present

## 2020-04-27 DIAGNOSIS — R7309 Other abnormal glucose: Secondary | ICD-10-CM

## 2020-04-27 DIAGNOSIS — Z4659 Encounter for fitting and adjustment of other gastrointestinal appliance and device: Secondary | ICD-10-CM

## 2020-04-27 DIAGNOSIS — G479 Sleep disorder, unspecified: Secondary | ICD-10-CM

## 2020-04-27 DIAGNOSIS — R4189 Other symptoms and signs involving cognitive functions and awareness: Secondary | ICD-10-CM | POA: Diagnosis not present

## 2020-04-27 LAB — COMPREHENSIVE METABOLIC PANEL
ALT: 16 U/L (ref 0–44)
AST: 23 U/L (ref 15–41)
Albumin: 3 g/dL — ABNORMAL LOW (ref 3.5–5.0)
Alkaline Phosphatase: 83 U/L (ref 38–126)
Anion gap: 9 (ref 5–15)
BUN: 18 mg/dL (ref 8–23)
CO2: 23 mmol/L (ref 22–32)
Calcium: 8.4 mg/dL — ABNORMAL LOW (ref 8.9–10.3)
Chloride: 104 mmol/L (ref 98–111)
Creatinine, Ser: 0.92 mg/dL (ref 0.61–1.24)
GFR calc Af Amer: >60 mL/min (ref >60)
GFR calc non Af Amer: >60 mL/min (ref >60)
Glucose, Bld: 249 mg/dL — ABNORMAL HIGH (ref 70–99)
Potassium: 4.1 mmol/L (ref 3.5–5.1)
Sodium: 136 mmol/L (ref 135–145)
Total Bilirubin: 2 mg/dL — ABNORMAL HIGH (ref 0.3–1.2)
Total Protein: 6 g/dL — ABNORMAL LOW (ref 6.5–8.1)

## 2020-04-27 LAB — CBC
HCT: 42.7 % (ref 39.0–52.0)
Hemoglobin: 14 g/dL (ref 13.0–17.0)
MCH: 30 pg (ref 26.0–34.0)
MCHC: 32.8 g/dL (ref 30.0–36.0)
MCV: 91.6 fL (ref 80.0–100.0)
Platelets: 306 10*3/uL (ref 150–400)
RBC: 4.66 MIL/uL (ref 4.22–5.81)
RDW: 12.9 % (ref 11.5–15.5)
WBC: 16 10*3/uL — ABNORMAL HIGH (ref 4.0–10.5)
nRBC: 0 % (ref 0.0–0.2)

## 2020-04-27 LAB — GLUCOSE, CAPILLARY
Glucose-Capillary: 230 mg/dL — ABNORMAL HIGH (ref 70–99)
Glucose-Capillary: 226 mg/dL — ABNORMAL HIGH (ref 70–99)
Glucose-Capillary: 262 mg/dL — ABNORMAL HIGH (ref 70–99)
Glucose-Capillary: 176 mg/dL — ABNORMAL HIGH (ref 70–99)

## 2020-04-27 IMAGING — CR DG CHEST 2V
2 series · 2 of 2 positions shown · non-contrast
Comparison: None.

CLINICAL DATA: Wheezing.  Cough.

EXAM:
CHEST - 2 VIEW

[chest lat]
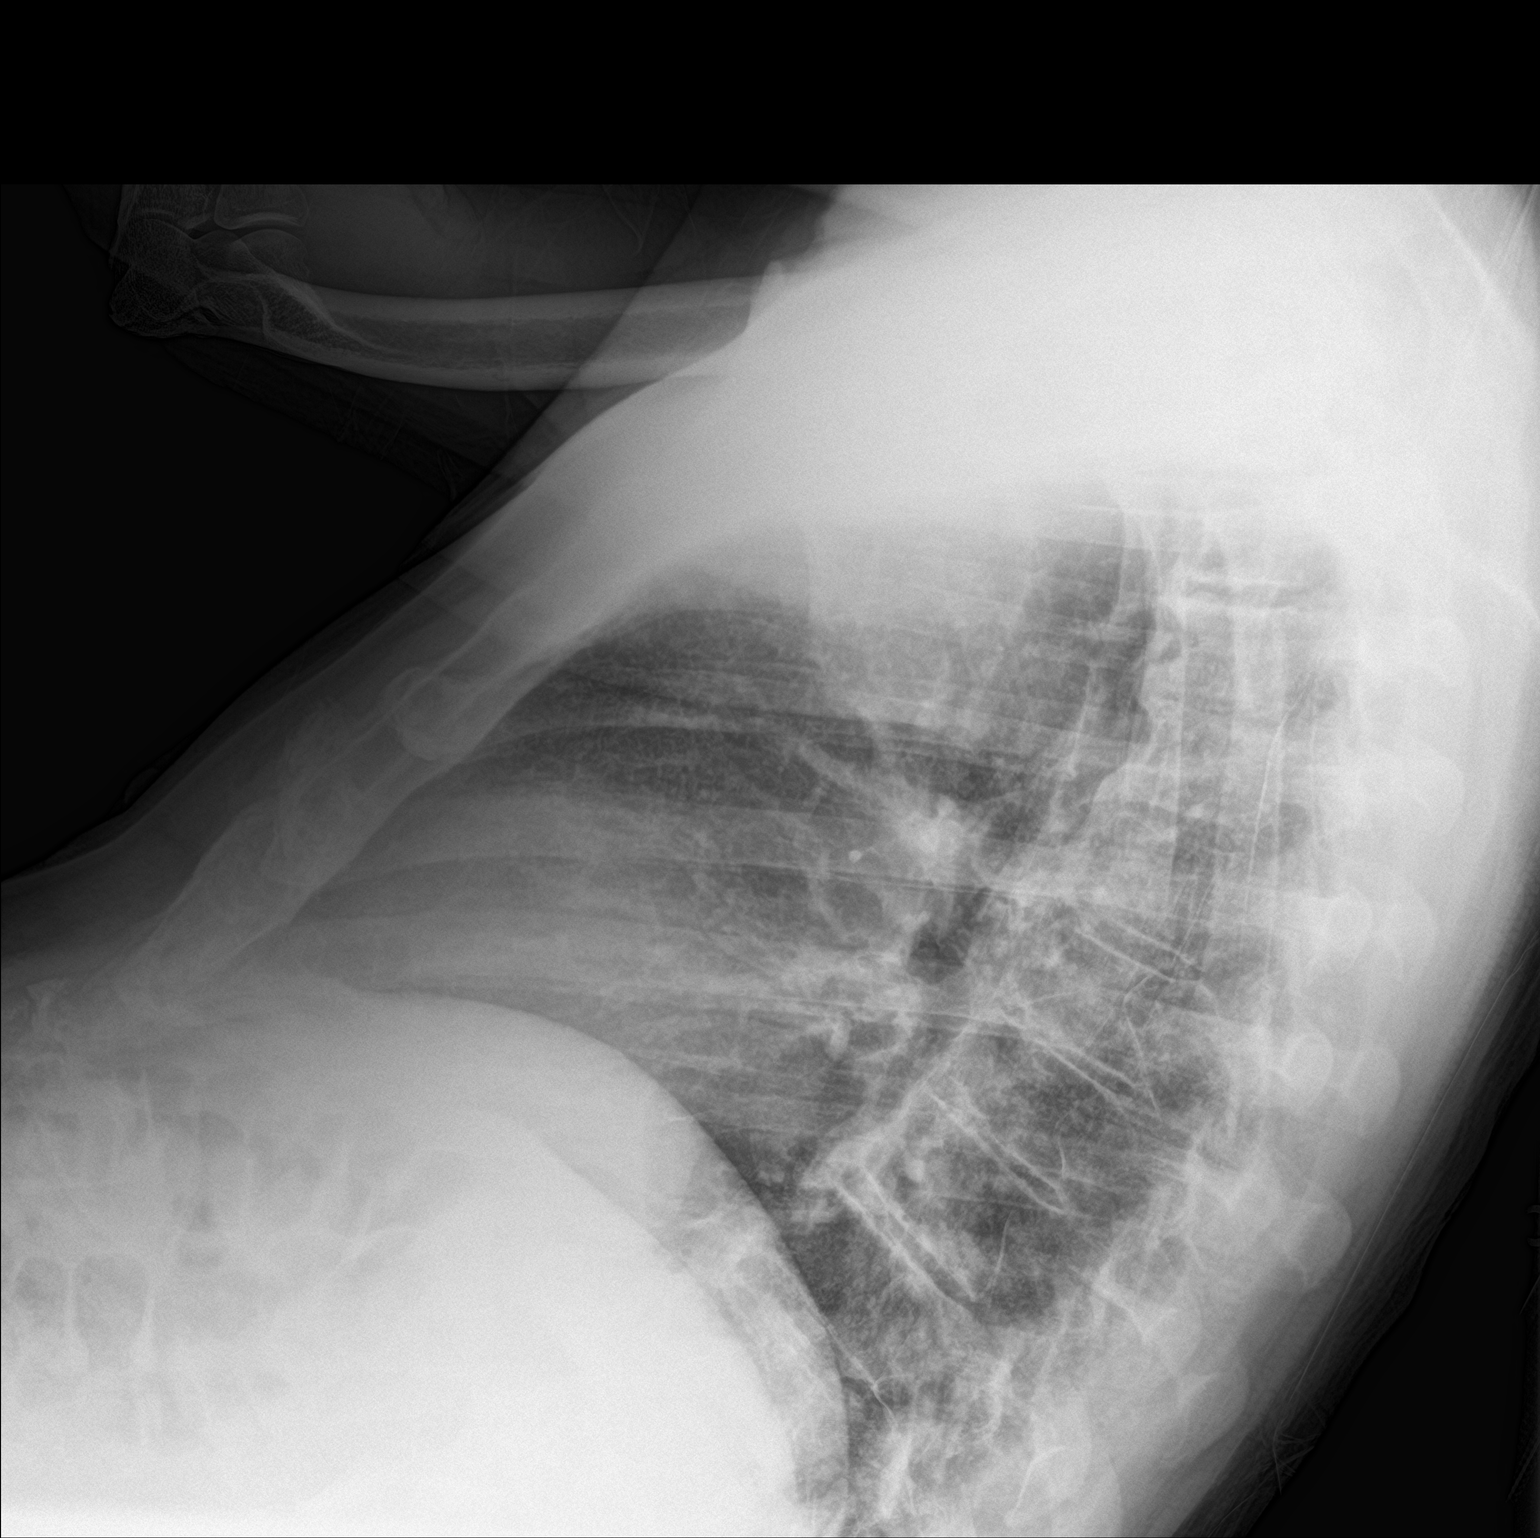

[chest ap]
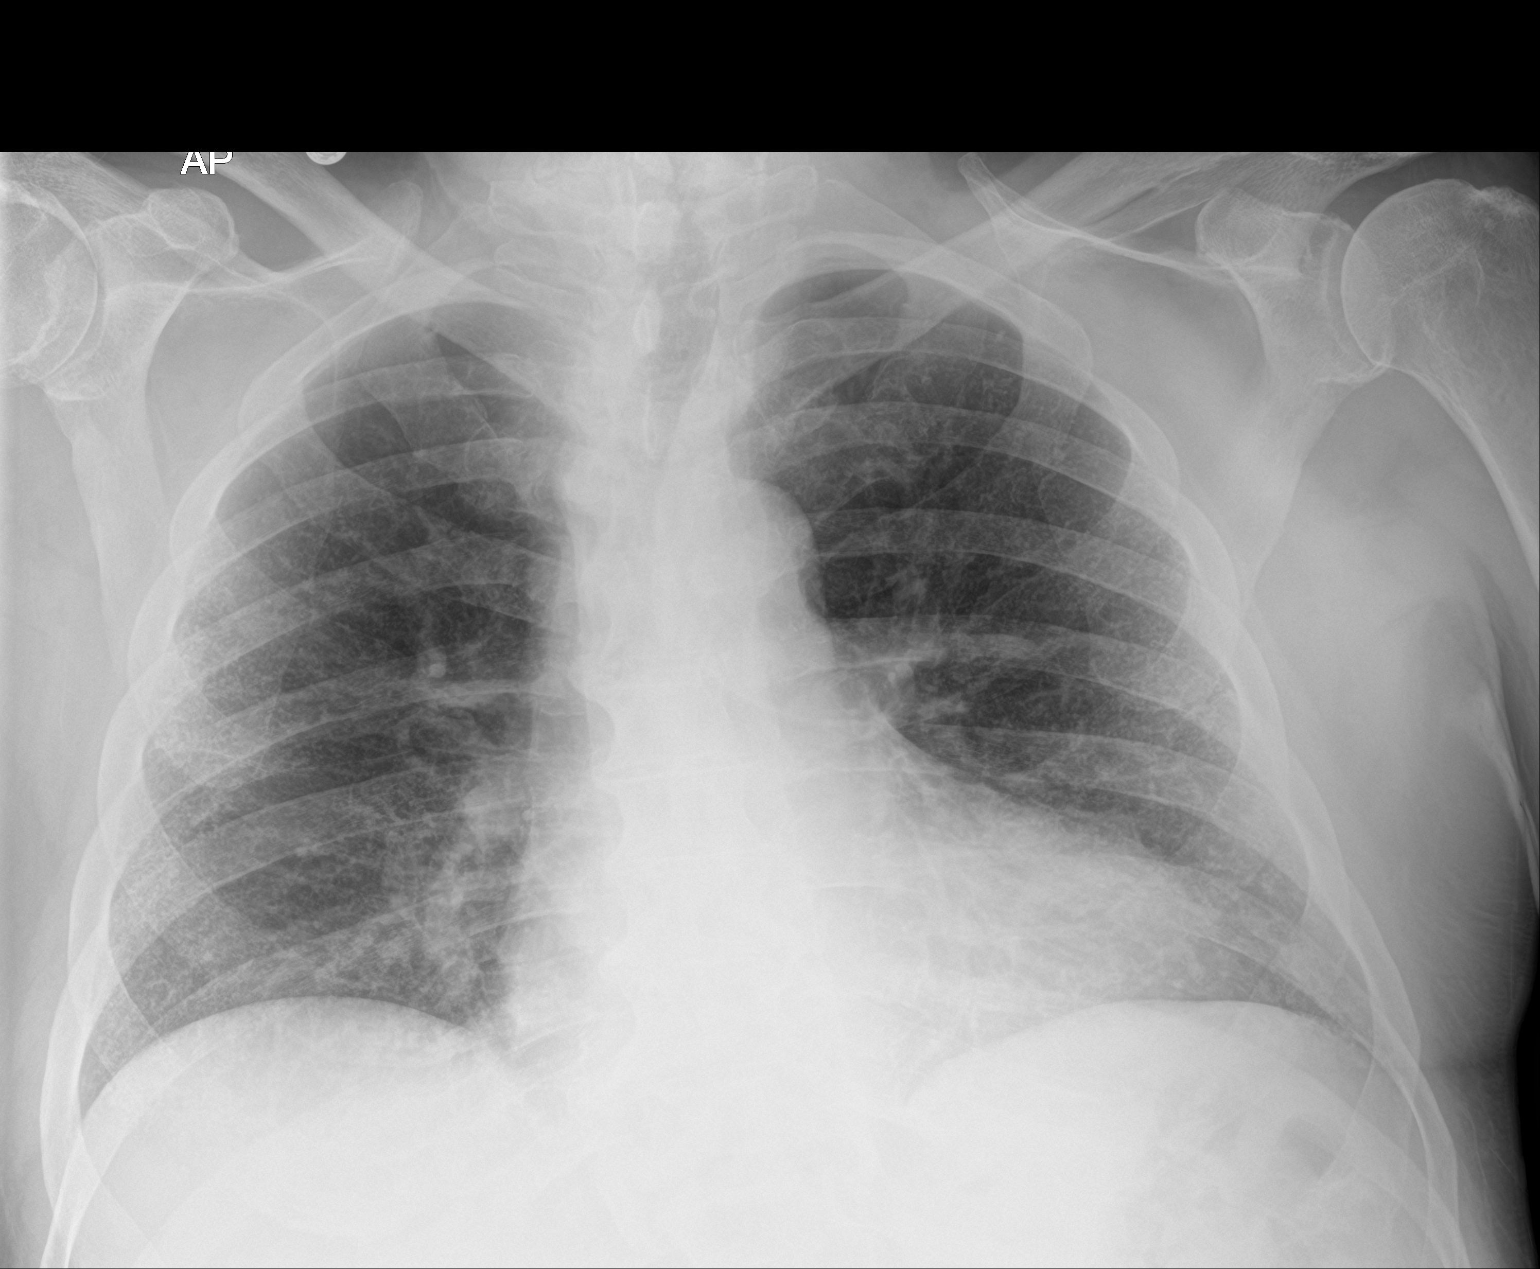

[2 of 2 positions shown; findings below may reference images not displayed]

FINDINGS: Low lung volumes. Upper normal heart size with normal mediastinal
contours. There are fine reticular opacities in a peripheral and
basilar predominant distribution. No confluent airspace disease. No
pleural effusion or pneumothorax. Degenerative change in the spine.
No acute osseous abnormalities are seen.
IMPRESSION: Fine reticular opacities in a peripheral and basilar predominant
distribution, suspicious for interstitial lung disease. Possibility
of atypical infection is also considered. Consider follow-up
high-resolution chest CT.

## 2020-04-27 MED ORDER — GUAIFENESIN-DM 100-10 MG/5ML PO SYRP: 5.0000 mL | ORAL_SOLUTION | Freq: Four times a day (QID) | ORAL | Status: DC | PRN

## 2020-04-27 MED ORDER — TRAMADOL HCL 50 MG PO TABS
50.0000 mg | ORAL_TABLET | Freq: Four times a day (QID) | ORAL | Status: DC | PRN
  Administered 2020-04-30: 50 mg via ORAL
  Filled 2020-04-27: qty 1

## 2020-04-27 MED ORDER — INSULIN GLARGINE 100 UNIT/ML ~~LOC~~ SOLN
12.0000 [IU] | Freq: Every day | SUBCUTANEOUS | Status: DC
  Administered 2020-04-27: 12 [IU] via SUBCUTANEOUS
  Filled 2020-04-27: qty 0.12

## 2020-04-27 MED ORDER — METHOCARBAMOL 1000 MG/10ML IJ SOLN
500.0000 mg | Freq: Four times a day (QID) | INTRAVENOUS | Status: DC | PRN
  Filled 2020-04-27: qty 5

## 2020-04-27 MED ORDER — LIDOCAINE HCL URETHRAL/MUCOSAL 2 % EX GEL: CUTANEOUS | Status: DC | PRN

## 2020-04-27 MED ORDER — ALUM & MAG HYDROXIDE-SIMETH 200-200-20 MG/5ML PO SUSP: 30.0000 mL | ORAL | Status: DC | PRN

## 2020-04-27 MED ORDER — IRBESARTAN 300 MG PO TABS
300.0000 mg | ORAL_TABLET | Freq: Every day | ORAL | Status: DC
  Administered 2020-04-28 – 2020-05-18 (×21): 300 mg via ORAL
  Filled 2020-04-27 (×21): qty 1

## 2020-04-27 MED ORDER — POLYETHYLENE GLYCOL 3350 17 G PO PACK
17.0000 g | PACK | Freq: Every day | ORAL | Status: DC | PRN
  Administered 2020-05-10: 17 g via ORAL
  Filled 2020-04-27: qty 1

## 2020-04-27 MED ORDER — TRAZODONE HCL 50 MG PO TABS: 25.0000 mg | ORAL_TABLET | Freq: Every evening | ORAL | Status: DC | PRN

## 2020-04-27 MED ORDER — HYDROCODONE-ACETAMINOPHEN 5-325 MG PO TABS: 1.0000 | ORAL_TABLET | ORAL | Status: DC | PRN

## 2020-04-27 MED ORDER — PROCHLORPERAZINE MALEATE 5 MG PO TABS: 5.0000 mg | ORAL_TABLET | Freq: Four times a day (QID) | ORAL | Status: DC | PRN

## 2020-04-27 MED ORDER — INSULIN GLARGINE 100 UNIT/ML ~~LOC~~ SOLN
10.0000 [IU] | Freq: Every day | SUBCUTANEOUS | Status: DC
  Administered 2020-04-28 – 2020-05-05 (×8): 10 [IU] via SUBCUTANEOUS
  Filled 2020-04-27 (×8): qty 0.1

## 2020-04-27 MED ORDER — ACETAMINOPHEN 325 MG PO TABS
325.0000 mg | ORAL_TABLET | ORAL | Status: DC | PRN
  Administered 2020-04-30: 650 mg via ORAL
  Administered 2020-05-02: 325 mg via ORAL
  Administered 2020-05-02 – 2020-05-18 (×9): 650 mg via ORAL
  Filled 2020-04-27 (×11): qty 2

## 2020-04-27 MED ORDER — METHOCARBAMOL 500 MG PO TABS
500.0000 mg | ORAL_TABLET | Freq: Four times a day (QID) | ORAL | Status: DC | PRN
  Administered 2020-05-02 – 2020-05-18 (×4): 500 mg via ORAL
  Filled 2020-04-27 (×5): qty 1

## 2020-04-27 MED ORDER — FLEET ENEMA 7-19 GM/118ML RE ENEM: 1.0000 | ENEMA | Freq: Once | RECTAL | Status: DC | PRN

## 2020-04-27 MED ORDER — INSULIN ASPART 100 UNIT/ML ~~LOC~~ SOLN
0.0000 [IU] | Freq: Three times a day (TID) | SUBCUTANEOUS | Status: DC
  Administered 2020-04-28: 2 [IU] via SUBCUTANEOUS
  Administered 2020-04-29 (×3): 3 [IU] via SUBCUTANEOUS
  Administered 2020-04-30: 5 [IU] via SUBCUTANEOUS
  Administered 2020-04-30: 3 [IU] via SUBCUTANEOUS
  Administered 2020-04-30 – 2020-05-01 (×2): 5 [IU] via SUBCUTANEOUS
  Administered 2020-05-01: 7 [IU] via SUBCUTANEOUS
  Administered 2020-05-01: 5 [IU] via SUBCUTANEOUS
  Administered 2020-05-02 (×2): 2 [IU] via SUBCUTANEOUS
  Administered 2020-05-02: 3 [IU] via SUBCUTANEOUS
  Administered 2020-05-03 (×2): 5 [IU] via SUBCUTANEOUS
  Administered 2020-05-03: 3 [IU] via SUBCUTANEOUS
  Administered 2020-05-04: 7 [IU] via SUBCUTANEOUS

## 2020-04-27 MED ORDER — AMLODIPINE BESYLATE 5 MG PO TABS
5.0000 mg | ORAL_TABLET | Freq: Every day | ORAL | Status: DC
  Administered 2020-04-27: 5 mg via ORAL
  Filled 2020-04-27: qty 1

## 2020-04-27 MED ORDER — GLIMEPIRIDE 2 MG PO TABS
1.0000 mg | ORAL_TABLET | Freq: Two times a day (BID) | ORAL | Status: DC
  Administered 2020-04-27 – 2020-05-02 (×6): 1 mg via ORAL
  Filled 2020-04-27 (×7): qty 1

## 2020-04-27 MED ORDER — METOPROLOL SUCCINATE ER 50 MG PO TB24
50.0000 mg | ORAL_TABLET | Freq: Every day | ORAL | Status: DC
  Administered 2020-04-28: 50 mg via ORAL
  Filled 2020-04-27: qty 1

## 2020-04-27 MED ORDER — TRAZODONE HCL 50 MG PO TABS
25.0000 mg | ORAL_TABLET | Freq: Every evening | ORAL | Status: DC | PRN
  Administered 2020-04-30: 25 mg via ORAL
  Administered 2020-05-02 – 2020-05-10 (×7): 50 mg via ORAL
  Filled 2020-04-27 (×8): qty 1

## 2020-04-27 MED ORDER — INSULIN ASPART 100 UNIT/ML ~~LOC~~ SOLN
0.0000 [IU] | Freq: Every day | SUBCUTANEOUS | Status: DC
  Administered 2020-04-28 – 2020-04-29 (×2): 2 [IU] via SUBCUTANEOUS
  Administered 2020-04-30: 3 [IU] via SUBCUTANEOUS
  Administered 2020-05-01 – 2020-05-03 (×2): 2 [IU] via SUBCUTANEOUS

## 2020-04-27 MED ORDER — BISACODYL 10 MG RE SUPP: 10.0000 mg | Freq: Every day | RECTAL | Status: DC | PRN

## 2020-04-27 MED ORDER — DIPHENHYDRAMINE HCL 12.5 MG/5ML PO ELIX
12.5000 mg | ORAL_SOLUTION | Freq: Four times a day (QID) | ORAL | Status: DC | PRN
  Administered 2020-05-11: 12.5 mg via ORAL
  Filled 2020-04-27: qty 10

## 2020-04-27 MED ORDER — PROCHLORPERAZINE EDISYLATE 10 MG/2ML IJ SOLN: 5.0000 mg | Freq: Four times a day (QID) | INTRAMUSCULAR | Status: DC | PRN

## 2020-04-27 MED ORDER — FAMOTIDINE 20 MG PO TABS
10.0000 mg | ORAL_TABLET | Freq: Two times a day (BID) | ORAL | Status: DC
  Administered 2020-04-27 – 2020-04-28 (×2): 10 mg via ORAL
  Filled 2020-04-27 (×2): qty 1

## 2020-04-27 MED ORDER — PROCHLORPERAZINE 25 MG RE SUPP: 12.5000 mg | Freq: Four times a day (QID) | RECTAL | Status: DC | PRN

## 2020-04-27 MED ORDER — METHOCARBAMOL 500 MG PO TABS: 500.0000 mg | ORAL_TABLET | Freq: Four times a day (QID) | ORAL | 0 refills | Status: DC

## 2020-04-27 MED ORDER — OXYCODONE-ACETAMINOPHEN 5-325 MG PO TABS: 1.0000 | ORAL_TABLET | ORAL | 0 refills | Status: DC | PRN

## 2020-04-27 NOTE — Progress Notes (Signed)
Physical Therapy Treatment Patient Details Name: Johnathan Barker MRN: [id number] DOB: November 12, 1941 Today's Date: 04/27/2020    History of Present Illness Pt is a 79 y.o. M with significant PMH of HTN, diabetes mellitus type 2, who presents with increasing weakness and difficulty ambulating. 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 Now s/p decompressive anterior cervical discectomy C3-4, anterior cervical arthrodesis C3-4, and thoracic decompressive laminectomy, medial facetectomy foraminotomies T10-11 04/26/2020.    PT Comments    Pt received with bowel incontinence; family stating, "he thought there was something back there to catch it." Assisted pt in conjunction with OT to bedside commode and subsequent peri care. Pt requiring two person moderate assist for gait, able to progress to ambulating a distance of 5 feet this session. Continues with poor RLE coordination, ataxic gait, balance impairments, and decreased cognition. Pt highly motivated and recommend CIR to address deficits, maximize functional independence and decrease caregiver burden.    Follow Up Recommendations  CIR     Equipment Recommendations  None recommended by PT    Recommendations for Other Services       Precautions / Restrictions Precautions Precautions: Fall;Back;Other (comment) Precaution Booklet Issued: Yes (comment) Precaution Comments: Ataxic gait Restrictions Weight Bearing Restrictions: No    Mobility  Bed Mobility Overal bed mobility: Needs Assistance Bed Mobility: Rolling;Sidelying to Sit Rolling: Min guard Sidelying to sit: Mod assist       General bed mobility comments: vc and tactile cues for bed mobility; did not remember precautions from yesterday's session  Transfers Overall transfer level: Needs assistance   Transfers: Sit to/from Stand Sit to Stand: Mod assist;+2 safety/equipment         General transfer comment: ModA to stand from elevated  bed height and BSC multiple times. Cues for hand/foot positioning.  Ambulation/Gait Ambulation/Gait assistance: Mod assist;+2 physical assistance Gait Distance (Feet): 5 Feet Assistive device: Rolling walker (2 wheeled) Gait Pattern/deviations: Step-through pattern;Decreased dorsiflexion - right;Ataxic;Narrow base of support Gait velocity: decreased   General Gait Details: Pt requiring heavy modA+ 2 for stability as pt continues with decreased RLE coordination and ataxic gait. Cues for sequencing/direction/wider BOS.     Stairs             Wheelchair Mobility    Modified Rankin (Stroke Patients Only)       Balance Overall balance assessment: Needs assistance   Sitting balance-Leahy Scale: Fair       Standing balance-Leahy Scale: Poor                              Cognition Arousal/Alertness: Awake/alert Behavior During Therapy: WFL for tasks assessed/performed Overall Cognitive Status: Impaired/Different from baseline Area of Impairment: Orientation;Attention;Memory;Safety/judgement;Awareness;Problem solving                 Orientation Level: Disoriented to;Time(June 21rst;l Sherley Bounds is president) Current Attention Level: Selective Memory: Decreased recall of precautions;Decreased short-term memory Following Commands: Follows one step commands with increased time Safety/Judgement: Decreased awareness of safety;Decreased awareness of deficits Awareness: Emergent Problem Solving: Slow processing;Difficulty sequencing        Exercises      General Comments General comments (skin integrity, edema, etc.): Family present during session      Pertinent Vitals/Pain Pain Assessment: Faces Faces Pain Scale: Hurts little more Pain Location: neck/throat Pain Descriptors / Indicators: Grimacing;Guarding Pain Intervention(s): Limited activity within patient's tolerance;Monitored during session    Home Living Family/patient expects to  be  discharged to:: Inpatient rehab Living Arrangements: Spouse/significant other Available Help at Discharge: Family;Available 24 hours/day Type of Home: House Home Access: Stairs to enter Entrance Stairs-Rails: Left Home Layout: One level Home Equipment: Walker - 2 wheels;Cane - single point;Wheelchair - manual;Shower seat      Prior Function Level of Independence: Needs assistance  Gait / Transfers Assistance Needed: uses walker ADL's / Homemaking Assistance Needed: intermittent assist with ADL's     PT Goals (current goals can now be found in the care plan section) Acute Rehab PT Goals Patient Stated Goal: "go to intensive rehab." Potential to Achieve Goals: Good Progress towards PT goals: Progressing toward goals    Frequency    Min 5X/week      PT Plan Current plan remains appropriate    Co-evaluation PT/OT/SLP Co-Evaluation/Treatment: Yes Reason for Co-Treatment: For patient/therapist safety;To address functional/ADL transfers;Complexity of the patient's impairments (multi-system involvement) PT goals addressed during session: Mobility/safety with mobility OT goals addressed during session: ADL's and self-care      AM-PAC PT "6 Clicks" Mobility   Outcome Measure  Help needed turning from your back to your side while in a flat bed without using bedrails?: A Little Help needed moving from lying on your back to sitting on the side of a flat bed without using bedrails?: A Lot Help needed moving to and from a bed to a chair (including a wheelchair)?: A Lot Help needed standing up from a chair using your arms (e.g., wheelchair or bedside chair)?: A Lot Help needed to walk in hospital room?: A Lot Help needed climbing 3-5 steps with a railing? : Total 6 Click Score: 12    End of Session Equipment Utilized During Treatment: Gait belt Activity Tolerance: Patient tolerated treatment well Patient left: in chair;with call bell/phone within reach;with chair alarm set;with  family/visitor present Nurse Communication: Mobility status PT Visit Diagnosis: Pain;Difficulty in walking, not elsewhere classified (R26.2);Ataxic gait (R26.0) Pain - part of body: (back)     Time: 1610-9604 PT Time Calculation (min) (ACUTE ONLY): 36 min  Charges:  $Gait Training: 8-22 mins                       Wyona [place]mas, PT, DPT Acute Rehabilitation Services Pager [phone] Office [phone]    Deno Etienne 04/27/2020, 1:52 PM

## 2020-04-27 NOTE — Anesthesia Postprocedure Evaluation (Signed)
Anesthesia Post Note  Patient: Johnathan Barker  Procedure(s) Performed: Cervical Three-Four anterior cervical decompression/discectomy/fusion with plate (N/A Spine Cervical) Thoracic Ten-Eleven Laminectomy (N/A Spine Lumbar)     Patient location during evaluation: PACU Anesthesia Type: General Level of consciousness: awake and alert Pain management: pain level controlled Vital Signs Assessment: post-procedure vital signs reviewed and stable Respiratory status: spontaneous breathing, nonlabored ventilation, respiratory function stable and patient connected to nasal cannula oxygen Cardiovascular status: blood pressure returned to baseline and stable Postop Assessment: no apparent nausea or vomiting Anesthetic complications: no    Last Vitals:  Vitals:   04/27/20 0001 04/27/20 0355  BP: (!) 158/78 (!) 153/76  Pulse: 98 87  Resp: 16 16  Temp: 36.4 C 36.7 C  SpO2: 99% 97%    Last Pain:  Vitals:   04/27/20 0355  TempSrc: Oral  PainSc:                  [place]

## 2020-04-27 NOTE — H&P (Signed)
Physical Medicine and Rehabilitation Admission H&P    Chief Complaint  Patient presents with  . 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     HPI:  Johnathan Barker is a 79 year old male with history of HTN, T2DM, BLE weakness with difficulty walking over past couple of months with frequent falls.  History taken from chart review, son, and wife due to mentation.  He was admitted on 04/24/2020 with lower extremity weakness and feeling cold.  MRI brain, personally reviewed, unremarkable for acute intracranial process. 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  Also found to have 3.5 cm right thyroid nodule with recommendations for thyroid ultrasound. Dr. 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 He was started on IV decadron and on 04/26/2020, patient underwent ACDF of C3/C4 and decompressive thoracic laminectomy/medial facetectomy.  Post op course complicated by confusion and disorientation per nurse and family (thought his wife was his mother and his son was his brother--has not been acting like himself per family).  Patient with associated bilateral lower extremity spasms.  Please see preadmission assessment from earlier today as well.  Review of Systems  Constitutional: Negative for chills and fever.  HENT: Negative for hearing loss.   Eyes: Negative for blurred vision and photophobia.  Respiratory: Negative for cough and shortness of breath.   Cardiovascular: Negative for chest pain and leg swelling.  Gastrointestinal: Positive for diarrhea and heartburn.  Genitourinary: Negative for dysuria and urgency.  Musculoskeletal: Positive for myalgias and neck  pain.  Skin: Negative for rash.  Neurological: Positive for sensory change and focal weakness.  Psychiatric/Behavioral: The patient is not nervous/anxious and does not have insomnia.   All other systems reviewed and are negative.   Past Medical History:  Diagnosis Date  . Arthritis    knees  . Diabetes mellitus without complication ([place])    type II  . Dysrhythmia   . History of kidney stones    lithotripsy  . Hypertension   . Weakness of both legs 04/24/2020    Past Surgical History:  Procedure Laterality Date  . EYE SURGERY Bilateral    cataracts removed= IOL  . TONSILLECTOMY    . 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 TOTAL KNEE ARTHROPLASTY Left 09/13/2018   Procedure: LEFT TOTAL KNEE ARTHROPLASTY;  Surgeon: Vickey Huger, MD;  Location: [facility];  Service: Orthopedics;  Laterality: Left;  with block    Family History  Problem Relation Age of Onset  . CAD Neg Hx   . Diabetes Mellitus II Neg Hx     Social History:  Married. Independent PTA but was has required walker for the past 2 months. He reports that he has never smoked. He has never used smokeless tobacco. He reports that he does not drink alcohol or use drugs.    Allergies  Allergen Reactions  . Penicillins Hives, Rash and Other (See Comments)    FEVER  PATIENT HAS HAD A PCN REACTION WITH IMMEDIATE RASH, FACIAL/TONGUE/THROAT SWELLING, SOB, OR LIGHTHEADEDNESS WITH HYPOTENSION:  #  #  #  YES  #  #  #  Has patient had a PCN reaction causing severe rash involving mucus membranes or skin necrosis: No Has patient had a PCN reaction that required hospitalization: No Has patient had a PCN reaction occurring within the last 10 years: No If all of the above answers are "NO", then may proceed with Cephalosporin use.     Medications Prior to Admission  Medication Sig Dispense Refill    . amLODipine (NORVASC) 5 MG tablet Take 5 mg by mouth at bedtime.     Marland Kitchen atorvastatin (LIPITOR) 10 MG tablet Take 10 mg by mouth daily.    Marland Kitchen glimepiride (AMARYL) 4 MG tablet Take 4 mg by mouth 2 (two) times daily. Morning & evening    . Insulin Glargine (BASAGLAR KWIKPEN) 100 UNIT/ML Inject 2 mLs into the skin at bedtime.    . metoprolol succinate (TOPROL-XL) 50 MG 24 hr tablet Take 50 mg by mouth daily before breakfast. Take with or immediately following a meal.     . olmesartan (BENICAR) 40 MG tablet Take 40 mg by mouth daily.    . Omega-3 Fatty Acids (FISH OIL PO) Take 1 capsule by mouth daily.     Marland Kitchen aspirin EC 325 MG EC tablet Take 1 tablet (325 mg total) by mouth 2 (two) times daily. (Patient not taking: Reported on 04/23/2020) 30 tablet 0  . methocarbamol (ROBAXIN) 500 MG tablet Take 1-2 tablets (500-1,000 mg total) by mouth every 6 (six) hours as needed for muscle spasms. (Patient not taking: Reported on 04/23/2020) 60 tablet 0  . oxyCODONE (OXY IR/ROXICODONE) 5 MG immediate release tablet Take 1-2 tablets (5-10 mg total) by mouth every 6 (six) hours as needed for moderate pain (pain score 4-6). (Patient not taking: Reported on 04/23/2020) 50 tablet 0    Drug Regimen Review  Drug regimen was reviewed and remains appropriate with no significant issues identified  Home: Home Living Family/patient expects to be discharged to:: Private residence Living Arrangements: Spouse/significant other Available Help at Discharge: Family, Available 24 hours/day Type of Home: House Home Access: Stairs to enter Technical brewer of Steps: 3 Entrance Stairs-Rails: Left Home Layout: One level Bathroom Shower/Tub: [place]: Environmental consultant - 2 wheels, [employer] - single point, Wheelchair - manual, Industrial/product designer History: Prior Function Level of Independence: Needs assistance Gait / Transfers Assistance Needed: uses walker ADL's / Homemaking Assistance Needed: intermittent assist  with ADL's Comments: Reports 4 falls in past several months  Functional Status:  Mobility: Bed Mobility Overal bed mobility: Needs Assistance Bed Mobility: Rolling, Sidelying to Sit Rolling: Min assist Sidelying to sit: Mod assist General bed mobility comments: Cues for log roll technique, modA for trunk to upright Transfers Overall transfer level: Needs assistance Equipment used: Rolling walker (2 wheeled) Transfers: Sit to/from Stand, [employer] Transfers Sit to Stand: Mod assist Stand pivot transfers: Mod assist General transfer comment: ModA to stand from elevated bed surface x 2. Cues for hand/foot positioning. Pre gait training including weight shifting, forward/backwards, and side stepping. Able to pivot over on second trial to chair with modA. Cues for sequencing, direction. Erratic placement of R foot and ataxic gait.      ADL:    Cognition: Cognition Overall Cognitive Status: Impaired/Different from baseline Orientation Level: Oriented X4(inappropriate conversation at times, talks about off subject) Cognition Arousal/Alertness: Awake/alert Behavior During Therapy: WFL for tasks assessed/performed Overall Cognitive Status: Impaired/Different from baseline Area of Impairment: Memory, Following commands, Awareness, Problem solving Memory: Decreased short-term memory Following Commands: Follows one step commands  inconsistently Awareness: Intellectual Problem Solving: Difficulty sequencing, Requires verbal cues General Comments: Acute confusion post surgery according to family. Pt with STM deficits noted, often telling me that there is another surgery planned for his back (which is inaccurate). Also difficulty following 1 step commands, i.e. repeatedly trying to get out of bed on the right side when rail was up and we were attempting to get out towards the left.   Physical Exam: Blood pressure (!) 142/77, pulse 84, temperature 98.2 F (36.8 C), temperature source Oral,  resp. rate 17, height 6' (1.829 m), weight 92.3 kg, SpO2 98 %. Physical Exam  Nursing note and vitals reviewed. Constitutional: He appears well-developed and well-nourished.  Wet voice  HENT:  Head: Normocephalic and atraumatic.  Eyes: EOM are normal. Right eye exhibits no discharge. Left eye exhibits no discharge.  Neck:  Cervical incision with dressing C/D/I  Respiratory: Effort normal. No respiratory distress. He has wheezes. He has rales in the right upper field and the left lower field.  GI: He exhibits distension. Bowel sounds are decreased. There is no abdominal tenderness.  Musculoskeletal:     Comments: No edema or tenderness in extremities  Neurological: He is alert.  Delayed processing Motor: Late due to distraction, removing all extremities spontaneously Oriented to person and place only Voice wet and muffled at times --he  Needed cues to clear saliva.   Skin: Skin is dry.  Upper back incision and neck incision with honey comb dressing, C/D/I  Psychiatric: His speech is delayed. He is slowed.   Results for orders placed or performed during the hospital encounter of 04/23/20 (from the past 48 hour(s))  Glucose, capillary     Status: Abnormal   Collection Time: 04/25/20 11:05 AM  Result Value Ref Range   Glucose-Capillary 337 (H) 70 - 99 mg/dL    Comment: Glucose reference range applies only to samples taken after fasting for at least 8 hours.  Glucose, capillary     Status: Abnormal   Collection Time: 04/25/20  4:16 PM  Result Value Ref Range   Glucose-Capillary 296 (H) 70 - 99 mg/dL    Comment: Glucose reference range applies only to samples taken after fasting for at least 8 hours.  Glucose, capillary     Status: Abnormal   Collection Time: 04/25/20  9:06 PM  Result Value Ref Range   Glucose-Capillary 376 (H) 70 - 99 mg/dL    Comment: Glucose reference range applies only to samples taken after fasting for at least 8 hours.  Surgical pcr screen     Status: None    Collection Time: 04/25/20 10:39 PM   Specimen: Nasal Mucosa; Nasal Swab  Result Value Ref Range   MRSA, PCR NEGATIVE NEGATIVE   Staphylococcus aureus NEGATIVE NEGATIVE    Comment: (NOTE) The Xpert SA Assay (FDA approved for NASAL specimens in patients 68 years of age and older), is one component of a comprehensive surveillance program. It is not intended to diagnose infection nor to guide or monitor treatment. Performed at [facility], [place] [address] [place], [place] [address]   Comprehensive metabolic panel     Status: Abnormal   Collection Time: 04/26/20  4:04 AM  Result Value Ref Range   Sodium 135 135 - 145 mmol/L   Potassium 4.0 3.5 - 5.1 mmol/L   Chloride 103 98 - 111 mmol/L   CO2 24 22 - 32 mmol/L   Glucose, Bld 417 (H) 70 - 99 mg/dL    Comment: Glucose  reference range applies only to samples taken after fasting for at least 8 hours.   BUN 24 (H) 8 - 23 mg/dL   Creatinine, Ser 1.00 0.61 - 1.24 mg/dL   Calcium 8.9 8.9 - 10.3 mg/dL   Total Protein 6.5 6.5 - 8.1 g/dL   Albumin 3.1 (L) 3.5 - 5.0 g/dL   AST 18 15 - 41 U/L   ALT 15 0 - 44 U/L   Alkaline Phosphatase 91 38 - 126 U/L   Total Bilirubin 1.5 (H) 0.3 - 1.2 mg/dL   GFR calc non Af Amer >60 >60 mL/min   GFR calc Af Amer >60 >60 mL/min   Anion gap 8 5 - 15    Comment: Performed at [facility] [address] [place], [place] [address]  CBC     Status: Abnormal   Collection Time: 04/26/20  4:04 AM  Result Value Ref Range   WBC 16.7 (H) 4.0 - 10.5 K/uL   RBC 4.62 4.22 - 5.81 MIL/uL   Hemoglobin 13.9 13.0 - 17.0 g/dL   HCT 41.0 39.0 - 52.0 %   MCV 88.7 80.0 - 100.0 fL   MCH 30.1 26.0 - 34.0 pg   MCHC 33.9 30.0 - 36.0 g/dL   RDW 12.8 11.5 - 15.5 %   Platelets 362 150 - 400 K/uL   nRBC 0.0 0.0 - 0.2 %    Comment: Performed at [facility], [place] [address] [place], [place] [address]  Glucose, capillary     Status: Abnormal   Collection Time: 04/26/20  5:34 AM  Result Value Ref Range    Glucose-Capillary 364 (H) 70 - 99 mg/dL    Comment: Glucose reference range applies only to samples taken after fasting for at least 8 hours.  Glucose, capillary     Status: Abnormal   Collection Time: 04/26/20  7:23 AM  Result Value Ref Range   Glucose-Capillary 328 (H) 70 - 99 mg/dL    Comment: Glucose reference range applies only to samples taken after fasting for at least 8 hours.  Glucose, capillary     Status: Abnormal   Collection Time: 04/26/20  9:35 AM  Result Value Ref Range   Glucose-Capillary 282 (H) 70 - 99 mg/dL    Comment: Glucose reference range applies only to samples taken after fasting for at least 8 hours.  Glucose, capillary     Status: Abnormal   Collection Time: 04/26/20 10:43 AM  Result Value Ref Range   Glucose-Capillary 235 (H) 70 - 99 mg/dL    Comment: Glucose reference range applies only to samples taken after fasting for at least 8 hours.  Glucose, capillary     Status: Abnormal   Collection Time: 04/26/20 11:14 AM  Result Value Ref Range   Glucose-Capillary 218 (H) 70 - 99 mg/dL    Comment: Glucose reference range applies only to samples taken after fasting for at least 8 hours.   Comment 1 Notify RN   Glucose, capillary     Status: Abnormal   Collection Time: 04/26/20  1:47 PM  Result Value Ref Range   Glucose-Capillary 223 (H) 70 - 99 mg/dL    Comment: Glucose reference range applies only to samples taken after fasting for at least 8 hours.  Glucose, capillary     Status: Abnormal   Collection Time: 04/26/20  4:03 PM  Result Value Ref Range   Glucose-Capillary 269 (H) 70 - 99 mg/dL    Comment: Glucose reference range  applies only to samples taken after fasting for at least 8 hours.  Glucose, capillary     Status: Abnormal   Collection Time: 04/26/20  8:52 PM  Result Value Ref Range   Glucose-Capillary 275 (H) 70 - 99 mg/dL    Comment: Glucose reference range applies only to samples taken after fasting for at least 8 hours.  Glucose, capillary      Status: Abnormal   Collection Time: 04/27/20  7:36 AM  Result Value Ref Range   Glucose-Capillary 230 (H) 70 - 99 mg/dL    Comment: Glucose reference range applies only to samples taken after fasting for at least 8 hours.  Comprehensive metabolic panel     Status: Abnormal   Collection Time: 04/27/20  7:57 AM  Result Value Ref Range   Sodium 136 135 - 145 mmol/L   Potassium 4.1 3.5 - 5.1 mmol/L   Chloride 104 98 - 111 mmol/L   CO2 23 22 - 32 mmol/L   Glucose, Bld 249 (H) 70 - 99 mg/dL    Comment: Glucose reference range applies only to samples taken after fasting for at least 8 hours.   BUN 18 8 - 23 mg/dL   Creatinine, Ser 0.92 0.61 - 1.24 mg/dL   Calcium 8.4 (L) 8.9 - 10.3 mg/dL   Total Protein 6.0 (L) 6.5 - 8.1 g/dL   Albumin 3.0 (L) 3.5 - 5.0 g/dL   AST 23 15 - 41 U/L   ALT 16 0 - 44 U/L   Alkaline Phosphatase 83 38 - 126 U/L   Total Bilirubin 2.0 (H) 0.3 - 1.2 mg/dL   GFR calc non Af Amer >60 >60 mL/min   GFR calc Af Amer >60 >60 mL/min   Anion gap 9 5 - 15    Comment: Performed at [facility] [address], [place], [place] [address]  CBC     Status: Abnormal   Collection Time: 04/27/20  7:57 AM  Result Value Ref Range   WBC 16.0 (H) 4.0 - 10.5 K/uL   RBC 4.66 4.22 - 5.81 MIL/uL   Hemoglobin 14.0 13.0 - 17.0 g/dL   HCT 42.7 39.0 - 52.0 %   MCV 91.6 80.0 - 100.0 fL   MCH 30.0 26.0 - 34.0 pg   MCHC 32.8 30.0 - 36.0 g/dL   RDW 12.9 11.5 - 15.5 %   Platelets 306 150 - 400 K/uL   nRBC 0.0 0.0 - 0.2 %    Comment: Performed at [facility], [place] [address], [place], [place] [address]   DG Cervical Spine 2-3 Views  Result Date: 04/26/2020 CLINICAL DATA:  C3-4 ACDF and T10-11 laminectomy EXAM: CERVICAL SPINE - 2-3 VIEW; THORACOLUMBAR SPINE - 2 VIEW; DG C-ARM 1-60 MIN COMPARISON:  MRI 04/24/2020 FINDINGS: 4 C-arm fluoroscopic images were obtained intraoperatively and submitted for post operative interpretation. Intraoperative fluoroscopic images demonstrate  placement of ACDF hardware at the C3-4 level. Additional images demonstrate surgical instrumentation in the lower thoracic spine the T10-11 level. A total of 24.5 seconds of fluoroscopy time was utilized. Please see the performing provider's procedural report for further detail. IMPRESSION: As above. Electronically Signed   By: Davina Poke D.O.   On: 04/26/2020 13:20   DG THORACOLUMABAR SPINE  Result Date: 04/26/2020 CLINICAL DATA:  C3-4 ACDF and T10-11 laminectomy EXAM: CERVICAL SPINE - 2-3 VIEW; THORACOLUMBAR SPINE - 2 VIEW; DG C-ARM 1-60 MIN COMPARISON:  MRI 04/24/2020 FINDINGS: 4 C-arm fluoroscopic images were obtained intraoperatively and  submitted for post operative interpretation. Intraoperative fluoroscopic images demonstrate placement of ACDF hardware at the C3-4 level. Additional images demonstrate surgical instrumentation in the lower thoracic spine the T10-11 level. A total of 24.5 seconds of fluoroscopy time was utilized. Please see the performing provider's procedural report for further detail. IMPRESSION: As above. Electronically Signed   By: Davina Poke D.O.   On: 04/26/2020 13:20   DG C-Arm 1-60 Min  Result Date: 04/26/2020 CLINICAL DATA:  C3-4 ACDF and T10-11 laminectomy EXAM: CERVICAL SPINE - 2-3 VIEW; THORACOLUMBAR SPINE - 2 VIEW; DG C-ARM 1-60 MIN COMPARISON:  MRI 04/24/2020 FINDINGS: 4 C-arm fluoroscopic images were obtained intraoperatively and submitted for post operative interpretation. Intraoperative fluoroscopic images demonstrate placement of ACDF hardware at the C3-4 level. Additional images demonstrate surgical instrumentation in the lower thoracic spine the T10-11 level. A total of 24.5 seconds of fluoroscopy time was utilized. Please see the performing provider's procedural report for further detail. IMPRESSION: As above. Electronically Signed   By: Davina Poke D.O.   On: 04/26/2020 13:20       Medical Problem List and Plan: 1.  Deficits with mobility,  swallowing, transfers, cognition, self-care secondary to myelopathy s/p decompression of cervical and thoracic spine.  -patient may not shower  -ELOS/Goals: 10-14 days/supervision  Admit to CIR 2.  Antithrombotics: -DVT/anticoagulation:  Mechanical: Sequential compression devices, below knee Bilateral lower extremities  -antiplatelet therapy: N/A 3. Pain Management: Tylenol prn. Does not like Oxycodone. Will order ultram prn  Monitor with increased exertion as well as cognition  4. Mood: LCSW to follow for evaluation and support.   -antipsychotic agents: N/A 5. Neuropsych: This patient is not fully capable of making decisions on his own behalf. 6. Skin/Wound Care: Monitor wound for healing.  7. Fluids/Electrolytes/Nutrition: D/c IVF- encourage fluid intake. Monitor I/O. Check lytes in am.  8. HTN: Monitor BP -continue amlodipine, Irbesartan, and metoprolol.   Mild increased mobility 9. T2DM with hyperglycemia: Hgb A1c- 7.5. Monitor BS ac/hs. Used amaryl 4 mg bid and lantus 2 units HS. Will resume lower dose amaryl and titrate to home dose as indicated--continue to taper lantus to home dose.   Monitor with increased mobility 10. Leucocytosis: Likely secondary to steroids.  Monitor for fever or other signs of infection.   CBC ordered 11. Constipation:   Had diarrhea today senna bid-->will d/c laxatives for now.  12. Hyperbilirubinemia: CMP ordered 13. Dysphagia: ST to follow for evaluation. Will down grade diet to dysphagia 2. Intake has been poor today per RN. Voice getting worse per family--will order chest x-ray to rule out aspiration.   Bary Leriche, PA-C 04/27/2020  I have personally performed a face to face diagnostic evaluation, including, but not limited to relevant history and physical exam findings, of this patient and developed relevant assessment and plan.  Additionally, I have reviewed and concur with the physician assistant's documentation above.  Delice Lesch, MD, ABPMR

## 2020-04-27 NOTE — H&P (Signed)
Physical Medicine and Rehabilitation Admission H&P    Chief Complaint  Patient presents with  . 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     HPI:  Johnathan Barker. [name] is a 79 year old male with history of HTN, T2DM, BLE weakness with difficulty walking over past couple of months with frequent falls.  History taken from chart review, son, and wife due to mentation.  He was admitted on 04/24/2020 with lower extremity weakness and feeling cold.  MRI brain, personally reviewed, unremarkable for acute intracranial process. 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  Also found to have 3.5 cm right thyroid nodule with recommendations for thyroid ultrasound. Dr. 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 He was started on IV decadron and on 04/26/2020, patient underwent ACDF of C3/C4 and decompressive thoracic laminectomy/medial facetectomy.  Post op course complicated by confusion and disorientation per nurse and family (thought his wife was his mother and his son was his brother--has not been acting like himself per family).  Patient with associated bilateral lower extremity spasms.  Please see preadmission assessment from earlier today as well.  Review of Systems  Constitutional: Negative for chills and fever.  HENT: Negative for hearing loss.   Eyes: Negative for blurred vision and photophobia.  Respiratory: Negative for cough and shortness of breath.   Cardiovascular: Negative for chest pain and leg swelling.  Gastrointestinal: Positive for diarrhea and heartburn.  Genitourinary: Negative for dysuria and urgency.  Musculoskeletal: Positive for myalgias and neck  pain.  Skin: Negative for rash.  Neurological: Positive for sensory change and focal weakness.  Psychiatric/Behavioral: The patient is not nervous/anxious and does not have insomnia.   All other systems reviewed and are negative.   Past Medical History:  Diagnosis Date  . Arthritis    knees  . Diabetes mellitus without complication ([place])    type II  . Dysrhythmia   . History of kidney stones    lithotripsy  . Hypertension   . Weakness of both legs 04/24/2020    Past Surgical History:  Procedure Laterality Date  . EYE SURGERY Bilateral    cataracts removed= IOL  . TONSILLECTOMY    . 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 TOTAL KNEE ARTHROPLASTY Left 09/13/2018   Procedure: LEFT TOTAL KNEE ARTHROPLASTY;  Surgeon: Vickey Huger, MD;  Location: [facility];  Service: Orthopedics;  Laterality: Left;  with block    Family History  Problem Relation Age of Onset  . CAD Neg Hx   . Diabetes Mellitus II Neg Hx     Social History:  Married. Independent PTA but was has required walker for the past 2 months. He reports that he has never smoked. He has never used smokeless tobacco. He reports that he does not drink alcohol or use drugs.    Allergies  Allergen Reactions  . Penicillins Hives, Rash and Other (See Comments)    FEVER  PATIENT HAS HAD A PCN REACTION WITH IMMEDIATE RASH, FACIAL/TONGUE/THROAT SWELLING, SOB, OR LIGHTHEADEDNESS WITH HYPOTENSION:  #  #  #  YES  #  #  #  Has patient had a PCN reaction causing severe rash involving mucus membranes or skin necrosis: No Has patient had a PCN reaction that required hospitalization: No Has patient had a PCN reaction occurring within the last 10 years: No If all of the above answers are "NO", then may proceed with Cephalosporin use.     Medications Prior to Admission  Medication Sig Dispense Refill    . amLODipine (NORVASC) 5 MG tablet Take 5 mg by mouth at bedtime.     Marland Kitchen atorvastatin (LIPITOR) 10 MG tablet Take 10 mg by mouth daily.    Marland Kitchen glimepiride (AMARYL) 4 MG tablet Take 4 mg by mouth 2 (two) times daily. Morning & evening    . Insulin Glargine (BASAGLAR KWIKPEN) 100 UNIT/ML Inject 2 mLs into the skin at bedtime.    . metoprolol succinate (TOPROL-XL) 50 MG 24 hr tablet Take 50 mg by mouth daily before breakfast. Take with or immediately following a meal.     . olmesartan (BENICAR) 40 MG tablet Take 40 mg by mouth daily.    . Omega-3 Fatty Acids (FISH OIL PO) Take 1 capsule by mouth daily.     Marland Kitchen aspirin EC 325 MG EC tablet Take 1 tablet (325 mg total) by mouth 2 (two) times daily. (Patient not taking: Reported on 04/23/2020) 30 tablet 0  . methocarbamol (ROBAXIN) 500 MG tablet Take 1-2 tablets (500-1,000 mg total) by mouth every 6 (six) hours as needed for muscle spasms. (Patient not taking: Reported on 04/23/2020) 60 tablet 0  . oxyCODONE (OXY IR/ROXICODONE) 5 MG immediate release tablet Take 1-2 tablets (5-10 mg total) by mouth every 6 (six) hours as needed for moderate pain (pain score 4-6). (Patient not taking: Reported on 04/23/2020) 50 tablet 0    Drug Regimen Review  Drug regimen was reviewed and remains appropriate with no significant issues identified  Home: Home Living Family/patient expects to be discharged to:: Private residence Living Arrangements: Spouse/significant other Available Help at Discharge: Family, Available 24 hours/day Type of Home: House Home Access: Stairs to enter Technical brewer of Steps: 3 Entrance Stairs-Rails: Left Home Layout: One level Bathroom Shower/Tub: [place]: Environmental consultant - 2 wheels, [employer] - single point, Wheelchair - manual, Industrial/product designer History: Prior Function Level of Independence: Needs assistance Gait / Transfers Assistance Needed: uses walker ADL's / Homemaking Assistance Needed: intermittent assist  with ADL's Comments: Reports 4 falls in past several months  Functional Status:  Mobility: Bed Mobility Overal bed mobility: Needs Assistance Bed Mobility: Rolling, Sidelying to Sit Rolling: Min assist Sidelying to sit: Mod assist General bed mobility comments: Cues for log roll technique, modA for trunk to upright Transfers Overall transfer level: Needs assistance Equipment used: Rolling walker (2 wheeled) Transfers: Sit to/from Stand, [employer] Transfers Sit to Stand: Mod assist Stand pivot transfers: Mod assist General transfer comment: ModA to stand from elevated bed surface x 2. Cues for hand/foot positioning. Pre gait training including weight shifting, forward/backwards, and side stepping. Able to pivot over on second trial to chair with modA. Cues for sequencing, direction. Erratic placement of R foot and ataxic gait.      ADL:    Cognition: Cognition Overall Cognitive Status: Impaired/Different from baseline Orientation Level: Oriented X4(inappropriate conversation at times, talks about off subject) Cognition Arousal/Alertness: Awake/alert Behavior During Therapy: WFL for tasks assessed/performed Overall Cognitive Status: Impaired/Different from baseline Area of Impairment: Memory, Following commands, Awareness, Problem solving Memory: Decreased short-term memory Following Commands: Follows one step commands  inconsistently Awareness: Intellectual Problem Solving: Difficulty sequencing, Requires verbal cues General Comments: Acute confusion post surgery according to family. Pt with STM deficits noted, often telling me that there is another surgery planned for his back (which is inaccurate). Also difficulty following 1 step commands, i.e. repeatedly trying to get out of bed on the right side when rail was up and we were attempting to get out towards the left.   Physical Exam: Blood pressure (!) 142/77, pulse 84, temperature 98.2 F (36.8 C), temperature source Oral,  resp. rate 17, height 6' (1.829 m), weight 92.3 kg, SpO2 98 %. Physical Exam  Nursing note and vitals reviewed. Constitutional: He appears well-developed and well-nourished.  Wet voice  HENT:  Head: Normocephalic and atraumatic.  Eyes: EOM are normal. Right eye exhibits no discharge. Left eye exhibits no discharge.  Neck:  Cervical incision with dressing C/D/I  Respiratory: Effort normal. No respiratory distress. He has wheezes. He has rales in the right upper field and the left lower field.  GI: He exhibits distension. Bowel sounds are decreased. There is no abdominal tenderness.  Musculoskeletal:     Comments: No edema or tenderness in extremities  Neurological: He is alert.  Delayed processing Motor: Late due to distraction, removing all extremities spontaneously Oriented to person and place only Voice wet and muffled at times --he  Needed cues to clear saliva.   Skin: Skin is dry.  Upper back incision and neck incision with honey comb dressing, C/D/I  Psychiatric: His speech is delayed. He is slowed.   Results for orders placed or performed during the hospital encounter of 04/23/20 (from the past 48 hour(s))  Glucose, capillary     Status: Abnormal   Collection Time: 04/25/20 11:05 AM  Result Value Ref Range   Glucose-Capillary 337 (H) 70 - 99 mg/dL    Comment: Glucose reference range applies only to samples taken after fasting for at least 8 hours.  Glucose, capillary     Status: Abnormal   Collection Time: 04/25/20  4:16 PM  Result Value Ref Range   Glucose-Capillary 296 (H) 70 - 99 mg/dL    Comment: Glucose reference range applies only to samples taken after fasting for at least 8 hours.  Glucose, capillary     Status: Abnormal   Collection Time: 04/25/20  9:06 PM  Result Value Ref Range   Glucose-Capillary 376 (H) 70 - 99 mg/dL    Comment: Glucose reference range applies only to samples taken after fasting for at least 8 hours.  Surgical pcr screen     Status: None    Collection Time: 04/25/20 10:39 PM   Specimen: Nasal Mucosa; Nasal Swab  Result Value Ref Range   MRSA, PCR NEGATIVE NEGATIVE   Staphylococcus aureus NEGATIVE NEGATIVE    Comment: (NOTE) The Xpert SA Assay (FDA approved for NASAL specimens in patients 54 years of age and older), is one component of a comprehensive surveillance program. It is not intended to diagnose infection nor to guide or monitor treatment. Performed at [facility], [place] [address], [place], [place] [address]   Comprehensive metabolic panel     Status: Abnormal   Collection Time: 04/26/20  4:04 AM  Result Value Ref Range   Sodium 135 135 - 145 mmol/L   Potassium 4.0 3.5 - 5.1 mmol/L   Chloride 103 98 - 111 mmol/L   CO2 24 22 - 32 mmol/L   Glucose, Bld 417 (H) 70 - 99 mg/dL    Comment: Glucose  reference range applies only to samples taken after fasting for at least 8 hours.   BUN 24 (H) 8 - 23 mg/dL   Creatinine, Ser 1.00 0.61 - 1.24 mg/dL   Calcium 8.9 8.9 - 10.3 mg/dL   Total Protein 6.5 6.5 - 8.1 g/dL   Albumin 3.1 (L) 3.5 - 5.0 g/dL   AST 18 15 - 41 U/L   ALT 15 0 - 44 U/L   Alkaline Phosphatase 91 38 - 126 U/L   Total Bilirubin 1.5 (H) 0.3 - 1.2 mg/dL   GFR calc non Af Amer >60 >60 mL/min   GFR calc Af Amer >60 >60 mL/min   Anion gap 8 5 - 15    Comment: Performed at [facility] [address], [place], [place] [address]  CBC     Status: Abnormal   Collection Time: 04/26/20  4:04 AM  Result Value Ref Range   WBC 16.7 (H) 4.0 - 10.5 K/uL   RBC 4.62 4.22 - 5.81 MIL/uL   Hemoglobin 13.9 13.0 - 17.0 g/dL   HCT 41.0 39.0 - 52.0 %   MCV 88.7 80.0 - 100.0 fL   MCH 30.1 26.0 - 34.0 pg   MCHC 33.9 30.0 - 36.0 g/dL   RDW 12.8 11.5 - 15.5 %   Platelets 362 150 - 400 K/uL   nRBC 0.0 0.0 - 0.2 %    Comment: Performed at [facility], [place] [address] [place], [place] [address]  Glucose, capillary     Status: Abnormal   Collection Time: 04/26/20  5:34 AM  Result Value Ref Range    Glucose-Capillary 364 (H) 70 - 99 mg/dL    Comment: Glucose reference range applies only to samples taken after fasting for at least 8 hours.  Glucose, capillary     Status: Abnormal   Collection Time: 04/26/20  7:23 AM  Result Value Ref Range   Glucose-Capillary 328 (H) 70 - 99 mg/dL    Comment: Glucose reference range applies only to samples taken after fasting for at least 8 hours.  Glucose, capillary     Status: Abnormal   Collection Time: 04/26/20  9:35 AM  Result Value Ref Range   Glucose-Capillary 282 (H) 70 - 99 mg/dL    Comment: Glucose reference range applies only to samples taken after fasting for at least 8 hours.  Glucose, capillary     Status: Abnormal   Collection Time: 04/26/20 10:43 AM  Result Value Ref Range   Glucose-Capillary 235 (H) 70 - 99 mg/dL    Comment: Glucose reference range applies only to samples taken after fasting for at least 8 hours.  Glucose, capillary     Status: Abnormal   Collection Time: 04/26/20 11:14 AM  Result Value Ref Range   Glucose-Capillary 218 (H) 70 - 99 mg/dL    Comment: Glucose reference range applies only to samples taken after fasting for at least 8 hours.   Comment 1 Notify RN   Glucose, capillary     Status: Abnormal   Collection Time: 04/26/20  1:47 PM  Result Value Ref Range   Glucose-Capillary 223 (H) 70 - 99 mg/dL    Comment: Glucose reference range applies only to samples taken after fasting for at least 8 hours.  Glucose, capillary     Status: Abnormal   Collection Time: 04/26/20  4:03 PM  Result Value Ref Range   Glucose-Capillary 269 (H) 70 - 99 mg/dL    Comment: Glucose reference range  applies only to samples taken after fasting for at least 8 hours.  Glucose, capillary     Status: Abnormal   Collection Time: 04/26/20  8:52 PM  Result Value Ref Range   Glucose-Capillary 275 (H) 70 - 99 mg/dL    Comment: Glucose reference range applies only to samples taken after fasting for at least 8 hours.  Glucose, capillary      Status: Abnormal   Collection Time: 04/27/20  7:36 AM  Result Value Ref Range   Glucose-Capillary 230 (H) 70 - 99 mg/dL    Comment: Glucose reference range applies only to samples taken after fasting for at least 8 hours.  Comprehensive metabolic panel     Status: Abnormal   Collection Time: 04/27/20  7:57 AM  Result Value Ref Range   Sodium 136 135 - 145 mmol/L   Potassium 4.1 3.5 - 5.1 mmol/L   Chloride 104 98 - 111 mmol/L   CO2 23 22 - 32 mmol/L   Glucose, Bld 249 (H) 70 - 99 mg/dL    Comment: Glucose reference range applies only to samples taken after fasting for at least 8 hours.   BUN 18 8 - 23 mg/dL   Creatinine, Ser 0.92 0.61 - 1.24 mg/dL   Calcium 8.4 (L) 8.9 - 10.3 mg/dL   Total Protein 6.0 (L) 6.5 - 8.1 g/dL   Albumin 3.0 (L) 3.5 - 5.0 g/dL   AST 23 15 - 41 U/L   ALT 16 0 - 44 U/L   Alkaline Phosphatase 83 38 - 126 U/L   Total Bilirubin 2.0 (H) 0.3 - 1.2 mg/dL   GFR calc non Af Amer >60 >60 mL/min   GFR calc Af Amer >60 >60 mL/min   Anion gap 9 5 - 15    Comment: Performed at [facility] [address], [place], [place] [address]  CBC     Status: Abnormal   Collection Time: 04/27/20  7:57 AM  Result Value Ref Range   WBC 16.0 (H) 4.0 - 10.5 K/uL   RBC 4.66 4.22 - 5.81 MIL/uL   Hemoglobin 14.0 13.0 - 17.0 g/dL   HCT 42.7 39.0 - 52.0 %   MCV 91.6 80.0 - 100.0 fL   MCH 30.0 26.0 - 34.0 pg   MCHC 32.8 30.0 - 36.0 g/dL   RDW 12.9 11.5 - 15.5 %   Platelets 306 150 - 400 K/uL   nRBC 0.0 0.0 - 0.2 %    Comment: Performed at [facility], [place] [address], [place], [place] [address]   DG Cervical Spine 2-3 Views  Result Date: 04/26/2020 CLINICAL DATA:  C3-4 ACDF and T10-11 laminectomy EXAM: CERVICAL SPINE - 2-3 VIEW; THORACOLUMBAR SPINE - 2 VIEW; DG C-ARM 1-60 MIN COMPARISON:  MRI 04/24/2020 FINDINGS: 4 C-arm fluoroscopic images were obtained intraoperatively and submitted for post operative interpretation. Intraoperative fluoroscopic images demonstrate  placement of ACDF hardware at the C3-4 level. Additional images demonstrate surgical instrumentation in the lower thoracic spine the T10-11 level. A total of 24.5 seconds of fluoroscopy time was utilized. Please see the performing provider's procedural report for further detail. IMPRESSION: As above. Electronically Signed   By: Davina Poke D.O.   On: 04/26/2020 13:20   DG THORACOLUMABAR SPINE  Result Date: 04/26/2020 CLINICAL DATA:  C3-4 ACDF and T10-11 laminectomy EXAM: CERVICAL SPINE - 2-3 VIEW; THORACOLUMBAR SPINE - 2 VIEW; DG C-ARM 1-60 MIN COMPARISON:  MRI 04/24/2020 FINDINGS: 4 C-arm fluoroscopic images were obtained intraoperatively and  submitted for post operative interpretation. Intraoperative fluoroscopic images demonstrate placement of ACDF hardware at the C3-4 level. Additional images demonstrate surgical instrumentation in the lower thoracic spine the T10-11 level. A total of 24.5 seconds of fluoroscopy time was utilized. Please see the performing provider's procedural report for further detail. IMPRESSION: As above. Electronically Signed   By: Davina Poke D.O.   On: 04/26/2020 13:20   DG C-Arm 1-60 Min  Result Date: 04/26/2020 CLINICAL DATA:  C3-4 ACDF and T10-11 laminectomy EXAM: CERVICAL SPINE - 2-3 VIEW; THORACOLUMBAR SPINE - 2 VIEW; DG C-ARM 1-60 MIN COMPARISON:  MRI 04/24/2020 FINDINGS: 4 C-arm fluoroscopic images were obtained intraoperatively and submitted for post operative interpretation. Intraoperative fluoroscopic images demonstrate placement of ACDF hardware at the C3-4 level. Additional images demonstrate surgical instrumentation in the lower thoracic spine the T10-11 level. A total of 24.5 seconds of fluoroscopy time was utilized. Please see the performing provider's procedural report for further detail. IMPRESSION: As above. Electronically Signed   By: Davina Poke D.O.   On: 04/26/2020 13:20       Medical Problem List and Plan: 1.  Deficits with mobility,  swallowing, transfers, cognition, self-care secondary to myelopathy s/p decompression of cervical and thoracic spine.  -patient may not shower  -ELOS/Goals: 10-14 days/supervision  Admit to CIR 2.  Antithrombotics: -DVT/anticoagulation:  Mechanical: Sequential compression devices, below knee Bilateral lower extremities  -antiplatelet therapy: N/A 3. Pain Management: Tylenol prn. Does not like Oxycodone. Will order ultram prn  Monitor with increased exertion as well as cognition  4. Mood: LCSW to follow for evaluation and support.   -antipsychotic agents: N/A 5. Neuropsych: This patient is not fully capable of making decisions on his own behalf. 6. Skin/Wound Care: Monitor wound for healing.  7. Fluids/Electrolytes/Nutrition: D/c IVF- encourage fluid intake. Monitor I/O. Check lytes in am.  8. HTN: Monitor BP -continue amlodipine, Irbesartan, and metoprolol.   Mild increased mobility 9. T2DM with hyperglycemia: Hgb A1c- 7.5. Monitor BS ac/hs. Used amaryl 4 mg bid and lantus 2 units HS. Will resume lower dose amaryl and titrate to home dose as indicated--continue to taper lantus to home dose.   Monitor with increased mobility 10. Leucocytosis: Likely secondary to steroids.  Monitor for fever or other signs of infection.   CBC ordered 11. Constipation:   Had diarrhea today senna bid-->will d/c laxatives for now.  12. Hyperbilirubinemia: CMP ordered 13. Dysphagia: ST to follow for evaluation. Will down grade diet to dysphagia 2. Intake has been poor today per RN. Voice getting worse per family--will order chest x-ray to rule out aspiration.  43.  Urinary retention  Check PVRs  Bary Leriche, PA-C 04/27/2020  I have personally performed a face to face diagnostic evaluation, including, but not limited to relevant history and physical exam findings, of this patient and developed relevant assessment and plan.  Additionally, I have reviewed and concur with the physician assistant's documentation  above.  Delice Lesch, MD, ABPMR  The patient's status has not changed. The original post admission physician evaluation remains appropriate, and any changes from the pre-admission screening or documentation from the acute chart are noted above.   Delice Lesch, MD, ABPMR

## 2020-04-27 NOTE — IPOC Note (Signed)
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[place])   Urinary retention   Dysphagia   Constipation   Type 2 diabetes mellitus with hyperglycemia, with long-term current use of insulin (HCC)   Benign essential HTN   Post-operative pain   Abnormal chest x-ray   FUO (fever of unknown origin)     Functional Problem List: Nursing Bladder, Bowel, Endurance, Medication Management, Motor, Safety, Skin Integrity  PT Balance, Endurance, Motor, Perception, Safety, Sensory  OT Balance, Safety, Cognition, Endurance, Motor, Nutrition, Pain  SLP Linguistic, Cognition, Nutrition  TR         Basic ADL's: OT Grooming, Bathing, Dressing, Toileting, Eating     Advanced  ADL's: OT Simple Meal Preparation     Transfers: PT Bed Mobility, Bed to Chair, Car, Furniture, Floor  OT Toilet, Tub/Shower     Locomotion: PT Ambulation, Emergency planning/management officer, Stairs     Additional Impairments: OT None  SLP Swallowing, Communication, Social Cognition expression Problem Solving, Memory, Attention, Awareness  TR      Anticipated Outcomes Item Anticipated Outcome  Self Feeding No goal- pt NPO  Swallowing  Supervision A   Basic self-care  Supervision/cuing  Toileting  Supervision/cuing   Bathroom Transfers Supervision/cuing  Bowel/Bladder  Pt will manage bowel and bladder with min assist  Transfers  Supervision  Locomotion  Supervision with LRAD  Communication     Cognition  Supervision A  Pain  Pt will manage pain at 3 or less on a scale of 0-10.  Safety/Judgment  Pt will remain free of falls with injury while in rehab with min assist   Therapy Plan: PT Intensity: Minimum of 1-2 x/day ,45 to 90 minutes PT Frequency: 5 out of 7 days PT Duration Estimated Length of Stay:  14-16 days OT Intensity: Minimum of 1-2 x/day, 45 to 90 minutes OT Frequency: 5 out of 7 days OT Duration/Estimated Length of Stay: 14-16 days SLP Intensity: Minumum of 1-2 x/day, 30 to 90 minutes SLP Frequency: 3 to 5 out of 7 days SLP Duration/Estimated Length of Stay: 14-16 days    Team Interventions: Nursing Interventions Patient/Family Education, Bladder Management, Bowel Management, Skin Care/Wound Management, Medication Management, Pain Management, Discharge Planning  PT interventions Ambulation/gait training, Balance/vestibular training, Cognitive remediation/compensation, Community reintegration, Discharge planning, Disease management/prevention, DME/adaptive equipment instruction, Functional mobility training, Neuromuscular re-education, Pain management, Patient/family education, Psychosocial support, Splinting/orthotics, Stair training, Therapeutic Activities, Therapeutic Exercise, UE/LE Strength taining/ROM, UE/LE Coordination activities, Wheelchair propulsion/positioning  OT Interventions Balance/vestibular training, Discharge planning, Functional electrical stimulation, Pain management, Self Care/advanced ADL retraining, Therapeutic Activities, UE/LE Coordination activities, Therapeutic Exercise, Patient/family education, Functional mobility training, Disease mangement/prevention, Cognitive remediation/compensation, Academic librarian, Engineer, drilling, Neuromuscular re-education, Psychosocial support, UE/LE Strength taining/ROM, Wheelchair propulsion/positioning  SLP Interventions Cognitive remediation/compensation, English as a second language teacher, Dysphagia/aspiration precaution training, Functional tasks, Patient/family education, Internal/external aids, Speech/Language facilitation  TR Interventions    SW/CM Interventions     Barriers to Discharge MD  Medical stability and IV antibiotics  Nursing      PT Medical stability, Incontinence    OT Medical stability,  Nutrition means    SLP      SW       Team Discharge Planning: Destination: PT-Home ,OT- Home , SLP-Home Projected Follow-up: PT-Home health PT, 24 hour supervision/assistance, OT-  Home health OT, 24 hour supervision/assistance, SLP-Home Health  SLP, 24 hour supervision/assistance Projected Equipment Needs: PT-Rolling walker with 5" wheels, OT- To be determined, SLP-None recommended by SLP Equipment Details: PT-TBD pending progress, OT-  Patient/family involved in discharge planning: PT- Patient,  OT-Patient, Family member/caregiver, SLP-Patient, Family member/caregiver  MD ELOS: 12-15 days. Medical Rehab Prognosis:  Excellent Assessment: 79 year old male with history of HTN, T2DM, BLE weakness with difficulty walking over past couple of months with frequent falls.  He was admitted on 04/24/2020 with lower extremity weakness and feeling cold.  MRI brain, personally reviewed, unremarkable for acute intracranial process. 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  Also found to have 3.5 cm right thyroid nodule with recommendations for thyroid ultrasound. Dr. 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 He was started on IV decadron and on 04/26/2020, patient underwent ACDF of C3/C4 and decompressive thoracic laminectomy/medial facetectomy.  Post op course complicated by confusion and disorientation per nurse and family (thought his wife was his mother and his son was his brother--has not been acting like himself per family).  Patient with associated bilateral lower extremity spasms.  Noted to have fevers with suspected aspiration pna, IV abx initiated. Patient with resulting functional  deficits with mobility, transfers, swallowing, self-care, cognition.  We will set goals for Supervision with PT/OT/SLP  Due to the current state of emergency, patients may not be receiving their 3-hours of Medicare-mandated therapy.  See Team Conference Notes for weekly updates to the plan of care

## 2020-04-27 NOTE — Progress Notes (Signed)
Inpatient Rehab Admissions Coordinator:   I have a bed available for Johnathan Barker to admit to CIR today. Dr. Ronnald Ramp and Margo Aye, NP, in agreement.  I will let pt/family and TOC team know.   Shann Medal, PT, DPT Admissions Coordinator [phone] 04/27/20  2:52 PM

## 2020-04-27 NOTE — Progress Notes (Signed)
Inpatient Rehab Admissions:  Inpatient Rehab Consult received.  I met with patient and his family at the bedside for rehabilitation assessment and to discuss goals and expectations of an inpatient rehab admission.  All are interested in CIR and hopeful for admission in the next few days pending bed availability.   Signed: Shann Medal, PT, DPT Admissions Coordinator [phone] 04/27/20  12:49 PM

## 2020-04-27 NOTE — Progress Notes (Signed)
PROGRESS NOTE    Johnathan Barker  RKY:[id number] DOB: [birth year] DOA: 04/23/2020 PCP: Nicoletta Dress, MD    Brief Narrative:  79 y.o. male with history of hypertension diabetes mellitus type 2 hyperlipidemia who was recently started on Lantus insulin 2 months ago presents to the ER because of increasing weakness over the last 4 to 5 days.  Weakness is mostly in the right lower extremity.  Has difficulty ambulating.  Patient states over the last 2 months patient has been using walker to walk.  Denies any incontinence of urine tingling or numbness or any difficulty swallowing speaking or visual symptoms.  Has no weakness of the upper extremity.  Assessment & Plan:   Principal Problem:   Weakness of right lower extremity Active Problems:   Essential hypertension   Diabetes mellitus type 2 in nonobese (HCC)   Cord compression (HCC)   Myelopathy ([place])  1. Right lower extremity weakness with difficulty ambulating and ataxia 1. 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 Neurosurgery consulted and pt underwent surgery 6/3 3. Pt discharged today per Neurosurgery 2. Diabetes mellitus type 2 uncontrolled  1. recently started on Lantus insulin which will be continued.   2. -cont SSI coverage as needed while in hospital 3. Hemoglobin A1c noted to be 7.5 3. Hypertension on ARB amlodipine and beta-blockers. 1. BP stable currently 2. Cont current regimen 4. Hyperlipidemia  1. Would continue statin on d/c  DVT prophylaxis: SCD's Code Status: Full Family Communication: Pt in room, family currently at bedside  Status is: Inpatient  Remains inpatient appropriate because:Ongoing diagnostic testing needed not appropriate for outpatient work up   Dispo: The patient is from: Home              Anticipated d/c is BT:DVVO  Anticipated d/c date is: today              Patient currently is medically stable to d/c.   Consultants:   Neurosurgery  Procedures:     Antimicrobials: Anti-infectives (From admission, onward)   Start     Dose/Rate Route Frequency Ordered Stop   04/26/20 1945  vancomycin (VANCOCIN) IVPB 1000 mg/200 mL premix     1,000 mg 200 mL/hr over 60 Minutes Intravenous  Once 04/26/20 1519 04/26/20 2222   04/26/20 0828  bacitracin 50,000 Units in sodium chloride 0.9 % 500 mL irrigation  Status:  Discontinued       As needed 04/26/20 0828 04/26/20 1108   04/26/20 0700  vancomycin (VANCOCIN) IVPB 1000 mg/200 mL premix  Status:  Discontinued     1,000 mg 200 mL/hr over 60 Minutes Intravenous To [facility] 04/25/20 1936 04/26/20 1446      Subjective: Without complaints  Objective: Vitals:   04/27/20 0001 04/27/20 0355 04/27/20 0900 04/27/20 1411  BP: (!) 158/78 (!) 153/76 (!) 142/77 113/65  Pulse: 98 87 84 87  Resp: 16 16 17 17   Temp: 97.6 F (36.4 C) 98.1 F (36.7 C) 98.2 F (36.8 C) 98.4 F (36.9 C)  TempSrc: Oral Oral Oral Oral  SpO2: 99% 97% 98% 100%  Weight:      Height:        Intake/Output Summary (Last 24 hours) at 04/27/2020 1635 Last data filed at 04/27/2020 1300 Gross per 24 hour  Intake 1852.7 ml  Output 1200 ml  Net 652.7 ml   Filed Weights   04/23/20 2124 04/24/20 1413 04/25/20 2106  Weight: 95.3 kg 92.3 kg 92.3 kg    Examination: General exam: Conversant, in no acute distress Respiratory system: normal chest rise, clear, no audible wheezing Cardiovascular system: regular rhythm, s1-s2 Gastrointestinal system: Nondistended, nontender, pos BS Central nervous system: No seizures, no tremors Extremities: No cyanosis, no joint deformities Skin: No rashes, no pallor Psychiatry: Affect normal // no auditory hallucinations   Data Reviewed: I have personally reviewed following labs and imaging studies  CBC: Recent Labs  Lab 04/23/20 2302  04/24/20 0548 04/25/20 0546 04/26/20 0404 04/27/20 0757  WBC 11.7* 13.4* 12.4* 16.7* 16.0*  NEUTROABS 8.2*  --   --   --   --   HGB 13.2 13.7 15.0 13.9 14.0  HCT 39.1 40.0 44.3 41.0 42.7  MCV 89.9 89.5 89.3 88.7 91.6  PLT 330 332 392 362 774   Basic Metabolic Panel: Recent Labs  Lab 04/23/20 2302 04/24/20 0240 04/24/20 0548 04/25/20 0546 04/26/20 0404 04/27/20 0757  NA 139  --  140 137 135 136  K 3.9  --  3.8 4.9 4.0 4.1  CL 104  --  105 103 103 104  CO2 25  --  27 24 24 23   GLUCOSE 255*  --  216* 320* 417* 249*  BUN 18  --  14 14 24* 18  CREATININE 0.85  --  0.81 1.04 1.00 0.92  CALCIUM 8.5*  --  8.6* 9.5 8.9 8.4*  MG  --  1.8  --   --   --   --    GFR: Estimated Creatinine Clearance: 73.8 mL/min (by C-G formula based on SCr of 0.92 mg/dL). Liver Function Tests: Recent Labs  Lab 04/23/20 2302 04/25/20 0546 04/26/20 0404 04/27/20 0757  AST 20 19 18 23   ALT 19 17 15 16   ALKPHOS 91 103 91 83  BILITOT 1.5* 2.1* 1.5* 2.0*  PROT 6.6 7.2 6.5 6.0*  ALBUMIN 3.6 3.5 3.1* 3.0*   No results for input(s): LIPASE, AMYLASE in the last 168 hours. No results for input(s): AMMONIA in the last 168 hours. Coagulation Profile: No results for input(s): INR, PROTIME in the last 168 hours. Cardiac Enzymes: No results for input(s): CKTOTAL, CKMB, CKMBINDEX, TROPONINI in the last 168 hours. BNP (last 3 results) No results for input(s): PROBNP in the last 8760 hours. HbA1C: No results for input(s): HGBA1C in the last 72 hours. CBG: Recent Labs  Lab 04/26/20 1347 04/26/20 1603 04/26/20 2052 04/27/20 0736 04/27/20 1223  GLUCAP 223* 269* 275* 230* 226*   Lipid Profile: No results for input(s): CHOL, HDL, LDLCALC, TRIG, CHOLHDL, LDLDIRECT in the last 72 hours. Thyroid Function Tests: No results for input(s): TSH, T4TOTAL, FREET4, T3FREE, THYROIDAB in the last 72 hours. Anemia Panel: No results for input(s): VITAMINB12, FOLATE, FERRITIN, TIBC, IRON, RETICCTPCT in the last 72  hours. Sepsis Labs: No results for input(s): PROCALCITON, LATICACIDVEN in the last 168 hours.  Recent Results (from the past 240 hour(s))  Urine culture     Status: None   Collection Time: 04/23/20 10:39 PM   Specimen: Urine, Clean Catch  Result Value Ref Range Status   Specimen Description   Final    URINE, CLEAN CATCH Performed at [facility], [place] [address], [place], [place] [address]    Special Requests   Final    NONE Performed at [facility], [place] [address], [place], [place] [address]  Culture   Final    NO GROWTH Performed at [facility], [place] [address] [place], [place] [address]    Report Status 04/25/2020 FINAL  Final  SARS Coronavirus 2 by RT PCR (hospital order, performed in [facility] hospital lab) Nasopharyngeal Nasopharyngeal Swab     Status: None   Collection Time: 04/24/20  6:45 AM   Specimen: Nasopharyngeal Swab  Result Value Ref Range Status   SARS Coronavirus 2 NEGATIVE NEGATIVE Final    Comment: (NOTE) SARS-CoV-2 target nucleic acids are NOT DETECTED. The SARS-CoV-2 RNA is generally detectable in upper and lower respiratory specimens during the acute phase of infection. The lowest concentration of SARS-CoV-2 viral copies this assay can detect is 250 copies / mL. A negative result does not preclude SARS-CoV-2 infection and should not be used as the sole basis for treatment or other patient management decisions.  A negative result may occur with improper specimen collection / handling, submission of specimen other than nasopharyngeal swab, presence of viral mutation(s) within the areas targeted by this assay, and inadequate number of viral copies (<250 copies / mL). A negative result must be combined with clinical observations, patient history, and epidemiological information. Fact Sheet for Patients:   StrictlyIdeas.no Fact Sheet for Healthcare  Providers: BankingDealers.co.za This test is not yet approved or cleared  by the [place] FDA and has been authorized for detection and/or diagnosis of SARS-CoV-2 by FDA under an Emergency Use Authorization (EUA).  This EUA will remain in effect (meaning this test can be used) for the duration of the COVID-19 declaration under Section 564(b)(1) of the Act, 21 U.S.C. section 360bbb-3(b)(1), unless the authorization is terminated or revoked sooner. Performed at [facility], [place] [address], [place], [place] [address]   Surgical pcr screen     Status: None   Collection Time: 04/25/20 10:39 PM   Specimen: Nasal Mucosa; Nasal Swab  Result Value Ref Range Status   MRSA, PCR NEGATIVE NEGATIVE Final   Staphylococcus aureus NEGATIVE NEGATIVE Final    Comment: (NOTE) The Xpert SA Assay (FDA approved for NASAL specimens in patients 59 years of age and older), is one component of a comprehensive surveillance program. It is not intended to diagnose infection nor to guide or monitor treatment. Performed at [facility], [place] [address] [place], [place] [address]      Radiology Studies: DG Cervical Spine 2-3 Views  Result Date: 04/26/2020 CLINICAL DATA:  C3-4 ACDF and T10-11 laminectomy EXAM: CERVICAL SPINE - 2-3 VIEW; THORACOLUMBAR SPINE - 2 VIEW; DG C-ARM 1-60 MIN COMPARISON:  MRI 04/24/2020 FINDINGS: 4 C-arm fluoroscopic images were obtained intraoperatively and submitted for post operative interpretation. Intraoperative fluoroscopic images demonstrate placement of ACDF hardware at the C3-4 level. Additional images demonstrate surgical instrumentation in the lower thoracic spine the T10-11 level. A total of 24.5 seconds of fluoroscopy time was utilized. Please see the performing provider's procedural report for further detail. IMPRESSION: As above. Electronically Signed   By: Davina Poke D.O.   On: 04/26/2020 13:20   DG THORACOLUMABAR  SPINE  Result Date: 04/26/2020 CLINICAL DATA:  C3-4 ACDF and T10-11 laminectomy EXAM: CERVICAL SPINE - 2-3 VIEW; THORACOLUMBAR SPINE - 2 VIEW; DG C-ARM 1-60 MIN COMPARISON:  MRI 04/24/2020 FINDINGS: 4 C-arm fluoroscopic images were obtained intraoperatively and submitted for post operative interpretation. Intraoperative fluoroscopic images demonstrate placement of ACDF hardware at the C3-4 level. Additional images demonstrate surgical instrumentation in the lower thoracic spine the T10-11 level. A  total of 24.5 seconds of fluoroscopy time was utilized. Please see the performing provider's procedural report for further detail. IMPRESSION: As above. Electronically Signed   By: Davina Poke D.O.   On: 04/26/2020 13:20   DG C-Arm 1-60 Min  Result Date: 04/26/2020 CLINICAL DATA:  C3-4 ACDF and T10-11 laminectomy EXAM: CERVICAL SPINE - 2-3 VIEW; THORACOLUMBAR SPINE - 2 VIEW; DG C-ARM 1-60 MIN COMPARISON:  MRI 04/24/2020 FINDINGS: 4 C-arm fluoroscopic images were obtained intraoperatively and submitted for post operative interpretation. Intraoperative fluoroscopic images demonstrate placement of ACDF hardware at the C3-4 level. Additional images demonstrate surgical instrumentation in the lower thoracic spine the T10-11 level. A total of 24.5 seconds of fluoroscopy time was utilized. Please see the performing provider's procedural report for further detail. IMPRESSION: As above. Electronically Signed   By: Davina Poke D.O.   On: 04/26/2020 13:20    Scheduled Meds: . amLODipine  5 mg Oral QHS  . insulin aspart  0-15 Units Subcutaneous TID WC  . insulin glargine  12 Units Subcutaneous Daily  . irbesartan  300 mg Oral Daily  . metoprolol succinate  50 mg Oral QAC breakfast  . senna  1 tablet Oral BID  . sodium chloride flush  3 mL Intravenous Q12H   Continuous Infusions: . sodium chloride    . 0.9 % NaCl with KCl 20 mEq / L 75 mL/hr at 04/27/20 0300  . methocarbamol (ROBAXIN) IV       LOS: 3  days   Marylu Lund, MD [facility] Pager On Amion  If 7PM-7AM, please contact night-coverage 04/27/2020, 4:35 PM

## 2020-04-27 NOTE — Discharge Summary (Signed)
Physician Discharge Summary  Patient ID: Johnathan Barker MRN: [id number] DOB/AGE: 1941/02/16 79 y.o.  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    Discharge Diagnoses: same   Discharged Condition: good  Hospital Course: The patient was admitted on 04/23/2020 and taken to the operating room where the patient underwent acdf C3-4, T10-T11 laminectomy. The patient tolerated the procedure well and was taken to the recovery room and then to the floor in stable condition. The hospital course was routine. There were no complications. The wound remained clean dry and intact. Pt had appropriate back and neck soreness. No complaints of arm or leg pain or new N/T/W. The patient remained afebrile with stable vital signs, and tolerated a regular diet. The patient continued to increase activities, and pain was well controlled with oral pain medications.   Consults: None  Significant Diagnostic Studies:  Results for orders placed or performed during the hospital encounter of 04/23/20  Urine culture   Specimen: Urine, Clean Catch  Result Value Ref Range   Specimen Description      URINE, CLEAN CATCH Performed at [facility], [place] [address] [place], [place] [address]    Special Requests      NONE Performed at [facility], [place] [address], [place], [place] [address]    Culture      NO GROWTH Performed at [facility], [place] [address] [place], [place] [address]    Report Status 04/25/2020 FINAL   SARS Coronavirus 2 by RT PCR (hospital order, performed in [facility] hospital lab) Nasopharyngeal Nasopharyngeal Swab   Specimen: Nasopharyngeal Swab  Result Value Ref Range   SARS Coronavirus 2 NEGATIVE NEGATIVE  Surgical pcr screen   Specimen: Nasal Mucosa; Nasal Swab  Result Value  Ref Range   MRSA, PCR NEGATIVE NEGATIVE   Staphylococcus aureus NEGATIVE NEGATIVE  CBC with Differential  Result Value Ref Range   WBC 11.7 (H) 4.0 - 10.5 K/uL   RBC 4.35 4.22 - 5.81 MIL/uL   Hemoglobin 13.2 13.0 - 17.0 g/dL   HCT 39.1 39.0 - 52.0 %   MCV 89.9 80.0 - 100.0 fL   MCH 30.3 26.0 - 34.0 pg   MCHC 33.8 30.0 - 36.0 g/dL   RDW 12.8 11.5 - 15.5 %   Platelets 330 150 - 400 K/uL   nRBC 0.0 0.0 - 0.2 %   Neutrophils Relative % 71 %   Neutro Abs 8.2 (H) 1.7 - 7.7 K/uL   Lymphocytes Relative 18 %   Lymphs Abs 2.1 0.7 - 4.0 K/uL   Monocytes Relative 8 %   Monocytes Absolute 1.0 0.1 - 1.0 K/uL   Eosinophils Relative 2 %   Eosinophils Absolute 0.3 0.0 - 0.5 K/uL   Basophils Relative 1 %   Basophils Absolute 0.1 0.0 - 0.1 K/uL   Immature Granulocytes 0 %   Abs Immature Granulocytes 0.03 0.00 - 0.07 K/uL  Comprehensive metabolic panel  Result Value Ref Range   Sodium 139 135 - 145 mmol/L   Potassium 3.9 3.5 - 5.1 mmol/L   Chloride 104 98 - 111 mmol/L   CO2 25 22 - 32 mmol/L   Glucose, Bld 255 (H) 70 - 99 mg/dL   BUN 18 8 - 23 mg/dL   Creatinine, Ser 0.85 0.61 - 1.24 mg/dL   Calcium 8.5 (L) 8.9 -  10.3 mg/dL   Total Protein 6.6 6.5 - 8.1 g/dL   Albumin 3.6 3.5 - 5.0 g/dL   AST 20 15 - 41 U/L   ALT 19 0 - 44 U/L   Alkaline Phosphatase 91 38 - 126 U/L   Total Bilirubin 1.5 (H) 0.3 - 1.2 mg/dL   GFR calc non Af Amer >60 >60 mL/min   GFR calc Af Amer >60 >60 mL/min   Anion gap 10 5 - 15  Urinalysis, Routine w reflex microscopic  Result Value Ref Range   Color, Urine YELLOW YELLOW   APPearance CLEAR CLEAR   Specific Gravity, Urine 1.020 1.005 - 1.030   pH 6.0 5.0 - 8.0   Glucose, UA >=500 (A) NEGATIVE mg/dL   Hgb urine dipstick NEGATIVE NEGATIVE   Bilirubin Urine NEGATIVE NEGATIVE   Ketones, ur 20 (A) NEGATIVE mg/dL   Protein, ur NEGATIVE NEGATIVE mg/dL   Nitrite NEGATIVE NEGATIVE   Leukocytes,Ua NEGATIVE NEGATIVE   RBC / HPF 0-5 0 - 5 RBC/hpf   WBC, UA 0-5 0 - 5  WBC/hpf   Bacteria, UA NONE SEEN NONE SEEN  Vitamin B12  Result Value Ref Range   Vitamin B-12 545 180 - 914 pg/mL  Hemoglobin A1c  Result Value Ref Range   Hgb A1c MFr Bld 7.5 (H) 4.8 - 5.6 %   Mean Plasma Glucose 168.55 mg/dL  Magnesium  Result Value Ref Range   Magnesium 1.8 1.7 - 2.4 mg/dL  Basic metabolic panel  Result Value Ref Range   Sodium 140 135 - 145 mmol/L   Potassium 3.8 3.5 - 5.1 mmol/L   Chloride 105 98 - 111 mmol/L   CO2 27 22 - 32 mmol/L   Glucose, Bld 216 (H) 70 - 99 mg/dL   BUN 14 8 - 23 mg/dL   Creatinine, Ser 0.81 0.61 - 1.24 mg/dL   Calcium 8.6 (L) 8.9 - 10.3 mg/dL   GFR calc non Af Amer >60 >60 mL/min   GFR calc Af Amer >60 >60 mL/min   Anion gap 8 5 - 15  CBC  Result Value Ref Range   WBC 13.4 (H) 4.0 - 10.5 K/uL   RBC 4.47 4.22 - 5.81 MIL/uL   Hemoglobin 13.7 13.0 - 17.0 g/dL   HCT 40.0 39.0 - 52.0 %   MCV 89.5 80.0 - 100.0 fL   MCH 30.6 26.0 - 34.0 pg   MCHC 34.3 30.0 - 36.0 g/dL   RDW 12.8 11.5 - 15.5 %   Platelets 332 150 - 400 K/uL   nRBC 0.0 0.0 - 0.2 %  CBC  Result Value Ref Range   WBC 12.4 (H) 4.0 - 10.5 K/uL   RBC 4.96 4.22 - 5.81 MIL/uL   Hemoglobin 15.0 13.0 - 17.0 g/dL   HCT 44.3 39.0 - 52.0 %   MCV 89.3 80.0 - 100.0 fL   MCH 30.2 26.0 - 34.0 pg   MCHC 33.9 30.0 - 36.0 g/dL   RDW 12.7 11.5 - 15.5 %   Platelets 392 150 - 400 K/uL   nRBC 0.0 0.0 - 0.2 %  Comprehensive metabolic panel  Result Value Ref Range   Sodium 137 135 - 145 mmol/L   Potassium 4.9 3.5 - 5.1 mmol/L   Chloride 103 98 - 111 mmol/L   CO2 24 22 - 32 mmol/L   Glucose, Bld 320 (H) 70 - 99 mg/dL   BUN 14 8 - 23 mg/dL   Creatinine, Ser 1.04 0.61 - 1.24 mg/dL  Calcium 9.5 8.9 - 10.3 mg/dL   Total Protein 7.2 6.5 - 8.1 g/dL   Albumin 3.5 3.5 - 5.0 g/dL   AST 19 15 - 41 U/L   ALT 17 0 - 44 U/L   Alkaline Phosphatase 103 38 - 126 U/L   Total Bilirubin 2.1 (H) 0.3 - 1.2 mg/dL   GFR calc non Af Amer >60 >60 mL/min   GFR calc Af Amer >60 >60 mL/min   Anion  gap 10 5 - 15  Glucose, capillary  Result Value Ref Range   Glucose-Capillary 213 (H) 70 - 99 mg/dL  Glucose, capillary  Result Value Ref Range   Glucose-Capillary 241 (H) 70 - 99 mg/dL  Glucose, capillary  Result Value Ref Range   Glucose-Capillary 294 (H) 70 - 99 mg/dL  Glucose, capillary  Result Value Ref Range   Glucose-Capillary 337 (H) 70 - 99 mg/dL  Glucose, capillary  Result Value Ref Range   Glucose-Capillary 296 (H) 70 - 99 mg/dL  Comprehensive metabolic panel  Result Value Ref Range   Sodium 135 135 - 145 mmol/L   Potassium 4.0 3.5 - 5.1 mmol/L   Chloride 103 98 - 111 mmol/L   CO2 24 22 - 32 mmol/L   Glucose, Bld 417 (H) 70 - 99 mg/dL   BUN 24 (H) 8 - 23 mg/dL   Creatinine, Ser 1.00 0.61 - 1.24 mg/dL   Calcium 8.9 8.9 - 10.3 mg/dL   Total Protein 6.5 6.5 - 8.1 g/dL   Albumin 3.1 (L) 3.5 - 5.0 g/dL   AST 18 15 - 41 U/L   ALT 15 0 - 44 U/L   Alkaline Phosphatase 91 38 - 126 U/L   Total Bilirubin 1.5 (H) 0.3 - 1.2 mg/dL   GFR calc non Af Amer >60 >60 mL/min   GFR calc Af Amer >60 >60 mL/min   Anion gap 8 5 - 15  CBC  Result Value Ref Range   WBC 16.7 (H) 4.0 - 10.5 K/uL   RBC 4.62 4.22 - 5.81 MIL/uL   Hemoglobin 13.9 13.0 - 17.0 g/dL   HCT 41.0 39.0 - 52.0 %   MCV 88.7 80.0 - 100.0 fL   MCH 30.1 26.0 - 34.0 pg   MCHC 33.9 30.0 - 36.0 g/dL   RDW 12.8 11.5 - 15.5 %   Platelets 362 150 - 400 K/uL   nRBC 0.0 0.0 - 0.2 %  Glucose, capillary  Result Value Ref Range   Glucose-Capillary 376 (H) 70 - 99 mg/dL  Glucose, capillary  Result Value Ref Range   Glucose-Capillary 364 (H) 70 - 99 mg/dL  Glucose, capillary  Result Value Ref Range   Glucose-Capillary 328 (H) 70 - 99 mg/dL  Glucose, capillary  Result Value Ref Range   Glucose-Capillary 282 (H) 70 - 99 mg/dL  Glucose, capillary  Result Value Ref Range   Glucose-Capillary 235 (H) 70 - 99 mg/dL  Glucose, capillary  Result Value Ref Range   Glucose-Capillary 218 (H) 70 - 99 mg/dL   Comment 1 Notify RN    Glucose, capillary  Result Value Ref Range   Glucose-Capillary 223 (H) 70 - 99 mg/dL  Glucose, capillary  Result Value Ref Range   Glucose-Capillary 269 (H) 70 - 99 mg/dL  Comprehensive metabolic panel  Result Value Ref Range   Sodium 136 135 - 145 mmol/L   Potassium 4.1 3.5 - 5.1 mmol/L   Chloride 104 98 - 111 mmol/L   CO2 23 22 - 32  mmol/L   Glucose, Bld 249 (H) 70 - 99 mg/dL   BUN 18 8 - 23 mg/dL   Creatinine, Ser 0.92 0.61 - 1.24 mg/dL   Calcium 8.4 (L) 8.9 - 10.3 mg/dL   Total Protein 6.0 (L) 6.5 - 8.1 g/dL   Albumin 3.0 (L) 3.5 - 5.0 g/dL   AST 23 15 - 41 U/L   ALT 16 0 - 44 U/L   Alkaline Phosphatase 83 38 - 126 U/L   Total Bilirubin 2.0 (H) 0.3 - 1.2 mg/dL   GFR calc non Af Amer >60 >60 mL/min   GFR calc Af Amer >60 >60 mL/min   Anion gap 9 5 - 15  CBC  Result Value Ref Range   WBC 16.0 (H) 4.0 - 10.5 K/uL   RBC 4.66 4.22 - 5.81 MIL/uL   Hemoglobin 14.0 13.0 - 17.0 g/dL   HCT 42.7 39.0 - 52.0 %   MCV 91.6 80.0 - 100.0 fL   MCH 30.0 26.0 - 34.0 pg   MCHC 32.8 30.0 - 36.0 g/dL   RDW 12.9 11.5 - 15.5 %   Platelets 306 150 - 400 K/uL   nRBC 0.0 0.0 - 0.2 %  Glucose, capillary  Result Value Ref Range   Glucose-Capillary 275 (H) 70 - 99 mg/dL  Glucose, capillary  Result Value Ref Range   Glucose-Capillary 230 (H) 70 - 99 mg/dL  Glucose, capillary  Result Value Ref Range   Glucose-Capillary 226 (H) 70 - 99 mg/dL  CBG monitoring, ED  Result Value Ref Range   Glucose-Capillary 228 (H) 70 - 99 mg/dL  CBG monitoring, ED  Result Value Ref Range   Glucose-Capillary 209 (H) 70 - 99 mg/dL    DG Cervical Spine 2-3 Views  Result Date: 04/26/2020 CLINICAL DATA:  C3-4 ACDF and T10-11 laminectomy EXAM: CERVICAL SPINE - 2-3 VIEW; THORACOLUMBAR SPINE - 2 VIEW; DG C-ARM 1-60 MIN COMPARISON:  MRI 04/24/2020 FINDINGS: 4 C-arm fluoroscopic images were obtained intraoperatively and submitted for post operative interpretation. Intraoperative fluoroscopic images demonstrate  placement of ACDF hardware at the C3-4 level. Additional images demonstrate surgical instrumentation in the lower thoracic spine the T10-11 level. A total of 24.5 seconds of fluoroscopy time was utilized. Please see the performing provider's procedural report for further detail. IMPRESSION: As above. Electronically Signed   By: Davina Poke D.O.   On: 04/26/2020 13:20   DG THORACOLUMABAR SPINE  Result Date: 04/26/2020 CLINICAL DATA:  C3-4 ACDF and T10-11 laminectomy EXAM: CERVICAL SPINE - 2-3 VIEW; THORACOLUMBAR SPINE - 2 VIEW; DG C-ARM 1-60 MIN COMPARISON:  MRI 04/24/2020 FINDINGS: 4 C-arm fluoroscopic images were obtained intraoperatively and submitted for post operative interpretation. Intraoperative fluoroscopic images demonstrate placement of ACDF hardware at the C3-4 level. Additional images demonstrate surgical instrumentation in the lower thoracic spine the T10-11 level. A total of 24.5 seconds of fluoroscopy time was utilized. Please see the performing provider's procedural report for further detail. IMPRESSION: As above. Electronically Signed   By: Davina Poke D.O.   On: 04/26/2020 13:20   CT Head Wo Contrast  Result Date: 04/24/2020 CLINICAL DATA:  Difficulty walking for several days EXAM: CT HEAD WITHOUT CONTRAST TECHNIQUE: Contiguous axial images were obtained from the base of the skull through the vertex without intravenous contrast. COMPARISON:  08/30/2018 FINDINGS: Brain: No evidence of acute infarction, hemorrhage, hydrocephalus, extra-axial collection or mass lesion/mass effect. Mild atrophic and ischemic changes are noted stable from prior exam. Vascular: No hyperdense vessel or unexpected calcification. Skull: Normal.  Negative for fracture or focal lesion. Sinuses/Orbits: No acute finding. Other: None. IMPRESSION: Chronic atrophic and ischemic changes without acute abnormality. Electronically Signed   By: Inez Catalina M.D.   On: 04/24/2020 00:28   MR BRAIN WO CONTRAST  Result  Date: 04/24/2020 CLINICAL DATA:  Focal neurological deficit, stroke suspected. EXAM: MRI HEAD WITHOUT CONTRAST TECHNIQUE: Multiplanar, multiecho pulse sequences of the brain and surrounding structures were obtained without intravenous contrast. COMPARISON:  04/24/2020 CT head. FINDINGS: Brain: No diffusion-weighted signal abnormality to suggest acute infarct. Mild diffuse parenchymal volume loss with ex vacuo dilatation. Scattered periventricular and deep white matter T2/FLAIR hyperintensities are nonspecific however commonly associated with chronic microvascular ischemic changes. 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 Left frontal superficial siderosis. No midline shift or extra-axial fluid collection. No mass lesion. Vascular: Normal flow voids. Skull and upper cervical spine: Normal marrow signal. Sinuses/Orbits: Sequela of bilateral lens replacement. Clear paranasal sinuses. No mastoid effusion. Other: None. IMPRESSION: No acute intracranial process.  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 Mild cerebral atrophy and chronic microvascular ischemic changes. Electronically Signed   By: Primitivo Gauze M.D.   On: 04/24/2020 09:16   MR CERVICAL SPINE WO CONTRAST  Result Date: 04/24/2020 CLINICAL DATA:  Chronic neck pain, difficulty walking EXAM: MRI CERVICAL SPINE WITHOUT CONTRAST TECHNIQUE: Multiplanar, multisequence MR imaging of the cervical spine was performed. No intravenous contrast was administered. COMPARISON:  None. FINDINGS: Alignment: No significant anteroposterior listhesis. Vertebrae: No substantial marrow edema or suspicious osseous lesion. Cord: No definite abnormal signal. Posterior Fossa, vertebral arteries, paraspinal tissues: Partially imaged right thyroid lobe nodule measuring 3.5 cm. Otherwise unremarkable. Disc levels: C2-C3: Facet hypertrophy with fusion on the right. No significant canal or foraminal  stenosis. C3-C4: Disc bulge with superimposed central disc protrusion, endplate osteophytes, and facet and uncovertebral hypertrophy. There is thickening and possible calcification along the ligamentum flavum on the right. Marked canal stenosis. Moderate to marked foraminal stenosis. C4-C5: Partial fusion across the disc space with endplate osteophytes and uncovertebral greater than facet hypertrophy. Mild canal stenosis. Mild right and moderate left foraminal stenosis. C5-C6: Partial fusion across the disc space with endplate osteophytes and uncovertebral greater than facet hypertrophy. Mild canal stenosis. No significant right foraminal stenosis. Minor left foraminal stenosis. C6-C7: Partial fusion across the disc space with endplate osteophytes and uncovertebral hypertrophy eccentric to the left. No significant canal stenosis. There is partial effacement the left ventral subarachnoid space. No significant right foraminal stenosis. Moderate left foraminal stenosis. C7-T1: Disc bulge, endplate osteophytes, and facet hypertrophy. No significant canal or foraminal stenosis. IMPRESSION: Multilevel degenerative changes as detailed above. Most notably, there is marked canal stenosis at C3-C4 without definite abnormal cord signal. 3.5 cm right thyroid lobe nodule. Recommend thyroid ultrasound if not previously performed. (Ref: J Am Coll Radiol. 2015 Feb;12(2): 143-50). Electronically Signed   By: Macy Mis M.D.   On: 04/24/2020 09:36   MR THORACIC SPINE WO CONTRAST  Result Date: 04/24/2020 CLINICAL DATA:  Back pain, difficulty walking EXAM: MRI THORACIC SPINE WITHOUT CONTRAST TECHNIQUE: Multiplanar, multisequence MR imaging of the thoracic spine was performed. No intravenous contrast was administered. COMPARISON:  None. FINDINGS: Motion artifact is present. Alignment: Preserved thoracic kyphosis and anterior posterior alignment. Vertebrae: Vertebral body heights are preserved apart from degenerative endplate  irregularity, greatest at T10-T11. There large bridging anterolateral endplate osteophytes, particularly at T10-T11. Degenerative endplate marrow edema is present at T10-T11. There is also degenerative edema associated  with the left facets at this level. Cord:  There is cord T2 hyperintensity at T10-T11. Paraspinal and other soft tissues: Unremarkable. Disc levels: There is mild multilevel degenerative disc disease and facet arthropathy. For example, small left paracentral disc protrusion at T1-T2 and small disc bulge and right paracentral disc protrusion at T8-T9. More significantly, at T10-T11, there is a disc bulge eccentric to the left, endplate osteophytes, and facet arthropathy with hypertrophic changes and ligamentum flavum thickening. This results in severe canal stenosis with cord compression. Mild to moderate foraminal stenosis. 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 These results were called by telephone at the time of interpretation on 04/24/2020 at 10:15 am to provider Dr. Teryl Lucy, who verbally acknowledged these results. Electronically Signed   By: Macy Mis M.D.   On: 04/24/2020 10:19   MR LUMBAR SPINE WO CONTRAST  Result Date: 04/24/2020 CLINICAL DATA:  Low back pain.  Difficulty walking EXAM: MRI LUMBAR SPINE WITHOUT CONTRAST TECHNIQUE: Multiplanar, multisequence MR imaging of the lumbar spine was performed. No intravenous contrast was administered. COMPARISON:  None. FINDINGS: Segmentation: Presumed standard anatomy with the inferior-most well developed disc space designated as L5-S1. Alignment:  Trace retrolisthesis L5 on S1.  Mild levocurvature. Vertebrae: No acute fracture, discitis, or suspicious bone lesion. The L2-3 and L4-5 levels appear chronically fused. Discogenic endplate marrow changes at L1-2 and L3-4. Mild diffuse marrow heterogeneity  without discrete marrow replacing lesion. Findings are nonspecific but can be seen in the setting of chronic anemia, smoking, and/or obesity. Conus medullaris and cauda equina: Conus extends to the L1 level. Conus and cauda equina appear normal. Paraspinal and other soft tissues: Negative. Disc levels: T12-L1: Mild bilateral facet arthropathy. No disc protrusion. No foraminal or canal stenosis. L1-L2: Disc height loss with mild diffuse disc bulge and degenerative endplate spurring. Right worse than left facet arthropathy and ligamentum flavum buckling result in severe canal stenosis with moderate right-sided foraminal stenosis. L2-L3: Fused disc space. Mild bilateral facet arthropathy resulting in mild-to-moderate canal stenosis. Prominent left extraforaminal endplate spurring slightly displaces the exited left L2 nerve root. No foraminal stenosis. L3-L4: Disc height loss with mild diffuse disc bulge and degenerative endplate spurring. Moderate bilateral facet arthropathy and ligamentum flavum buckling results in mild-to-moderate canal stenosis. Mild-to-moderate right and mild left foraminal stenosis. L4-L5: Fused disc space. Bilateral extraforaminal spurring laterally deviate the exited L4 nerve roots. No foraminal or canal stenosis. L5-S1: Disc height loss with endplate spurring and shallow central disc protrusion. Moderate left and mild right foraminal stenosis. No canal stenosis. IMPRESSION: 1. Multilevel degenerative changes of the lumbar spine greatest at L1-2 where there is severe canal stenosis and moderate right foraminal stenosis. 2. Mild-to-moderate canal stenosis at L2-3 and L3-4. 3. Mild diffuse marrow heterogeneity without discrete marrow replacing lesion. Findings are nonspecific but can be seen in the setting of chronic anemia, smoking, and/or obesity. Electronically Signed   By: Davina Poke D.O.   On: 04/24/2020 10:16   DG C-Arm 1-60 Min  Result Date: 04/26/2020 CLINICAL DATA:  C3-4 ACDF and  T10-11 laminectomy EXAM: CERVICAL SPINE - 2-3 VIEW; THORACOLUMBAR SPINE - 2 VIEW; DG C-ARM 1-60 MIN COMPARISON:  MRI 04/24/2020 FINDINGS: 4 C-arm fluoroscopic images were obtained intraoperatively and submitted for post operative interpretation. Intraoperative fluoroscopic images demonstrate placement of ACDF hardware at the C3-4 level. Additional images demonstrate surgical instrumentation in the lower thoracic spine the T10-11 level.  A total of 24.5 seconds of fluoroscopy time was utilized. Please see the performing provider's procedural report for further detail. IMPRESSION: As above. Electronically Signed   By: Davina Poke D.O.   On: 04/26/2020 13:20    Antibiotics:  Anti-infectives (From admission, onward)   Start     Dose/Rate Route Frequency Ordered Stop   04/26/20 1945  vancomycin (VANCOCIN) IVPB 1000 mg/200 mL premix     1,000 mg 200 mL/hr over 60 Minutes Intravenous  Once 04/26/20 1519 04/26/20 2222   04/26/20 0828  bacitracin 50,000 Units in sodium chloride 0.9 % 500 mL irrigation  Status:  Discontinued       As needed 04/26/20 0828 04/26/20 1108   04/26/20 0700  vancomycin (VANCOCIN) IVPB 1000 mg/200 mL premix  Status:  Discontinued     1,000 mg 200 mL/hr over 60 Minutes Intravenous To [facility] 04/25/20 1936 04/26/20 1446      Discharge Exam: Blood pressure 113/65, pulse 87, temperature 98.4 F (36.9 C), temperature source Oral, resp. rate 17, height 6' (1.829 m), weight 92.3 kg, SpO2 100 %. Neurologic: Grossly normal Ambulating and voiding well, incision cdi  Discharge Medications:   Allergies as of 04/27/2020      Reactions   Penicillins Hives, Rash, Other (See Comments)   FEVER PATIENT HAS HAD A PCN REACTION WITH IMMEDIATE RASH, FACIAL/TONGUE/THROAT SWELLING, SOB, OR LIGHTHEADEDNESS WITH HYPOTENSION:  #  #  #  YES  #  #  #   Has patient had a PCN reaction causing severe rash involving mucus membranes or skin necrosis: No Has patient had a PCN reaction that  required hospitalization: No Has patient had a PCN reaction occurring within the last 10 years: No If all of the above answers are "NO", then may proceed with Cephalosporin use.      Medication List    STOP taking these medications   oxyCODONE 5 MG immediate release tablet Commonly known as: Oxy IR/ROXICODONE     TAKE these medications   amLODipine 5 MG tablet Commonly known as: NORVASC Take 5 mg by mouth at bedtime.   aspirin 325 MG EC tablet Take 1 tablet (325 mg total) by mouth 2 (two) times daily.   atorvastatin 10 MG tablet Commonly known as: LIPITOR Take 10 mg by mouth daily.   Basaglar KwikPen 100 UNIT/ML Inject 2 mLs into the skin at bedtime.   FISH OIL PO Take 1 capsule by mouth daily.   glimepiride 4 MG tablet Commonly known as: AMARYL Take 4 mg by mouth 2 (two) times daily. Morning & evening   methocarbamol 500 MG tablet Commonly known as: Robaxin Take 1 tablet (500 mg total) by mouth 4 (four) times daily. What changed:   how much to take  when to take this  reasons to take this   metoprolol succinate 50 MG 24 hr tablet Commonly known as: TOPROL-XL Take 50 mg by mouth daily before breakfast. Take with or immediately following a meal.   olmesartan 40 MG tablet Commonly known as: BENICAR Take 40 mg by mouth daily.   oxyCODONE-acetaminophen 5-325 MG tablet Commonly known as: Percocet Take 1 tablet by mouth every 4 (four) hours as needed for severe pain.            Durable Medical Equipment  (From admission, onward)         Start     Ordered   04/26/20 1507  DME Walker rolling  Once    Question Answer Comment  Walker:  With 5 Inch Wheels   Patient needs a walker to treat with the following condition Gait instability      04/26/20 1506          Disposition: rehab   Final Dx: acdf C3-4, T10-T11 laminectomy  Discharge Instructions     Remove dressing in 72 hours   Complete by: As directed    Call MD for:  difficulty breathing,  headache or visual disturbances   Complete by: As directed    Call MD for:  hives   Complete by: As directed    Call MD for:  persistant dizziness or light-headedness   Complete by: As directed    Call MD for:  persistant nausea and vomiting   Complete by: As directed    Call MD for:  redness, tenderness, or signs of infection (pain, swelling, redness, odor or green/yellow discharge around incision site)   Complete by: As directed    Call MD for:  severe uncontrolled pain   Complete by: As directed    Call MD for:  temperature >100.4   Complete by: As directed    Diet - low sodium heart healthy   Complete by: As directed    Driving Restrictions   Complete by: As directed    No driving for 2 weeks, no riding in the car for 1 week   Increase activity slowly   Complete by: As directed    Lifting restrictions   Complete by: As directed    No lifting more than 8 lbs         Signed: Ocie Cornfield Maryana Pittmon 04/27/2020, 3:25 PM

## 2020-04-27 NOTE — Progress Notes (Signed)
Patient ID: Johnathan Barker, male   DOB: 02/28/1941, 79 y.o.   MRN: [id number]   He seems to be doing well.  He is out of bed to a potty chair.  He is somewhat disoriented at times.  His incisions are clean dry and intact.  He is moving all extremities strongly.  He is spastic in his lower extremities.  He is awaiting CIR.  PT and OT working with him.

## 2020-04-27 NOTE — Progress Notes (Signed)
Patient transferring to [phone]. Report called in to [employer].

## 2020-04-27 NOTE — PMR Pre-admission (Signed)
PMR Admission Coordinator Pre-Admission Assessment  Patient: Johnathan Barker is an 79 y.o., male MRN: [id number] DOB: 05-14-1941 Height: 6' (182.9 cm) Weight: 92.3 kg  Insurance Information HMO:     PPO:      PCP:      IPA:      80/20:      OTHER:  PRIMARY: Medicare A and B      Policy#: [id number]      Subscriber: pt CM Name:       Phone#:      Fax#:  Pre-Cert#: verified Civil engineer, contracting:  Benefits:  Phone #:      Name:  Eff. Date: A 10/25/07, B 11/25/07     Deduct: $1484      Out of Pocket Max: n/a      Life Max: n/a CIR: 100%      SNF: 20 full days Outpatient: 80%     Co-Pay: 20% Home Health: 100%      Co-Pay:  DME: 80%     Co-Pay: 20% Providers: pt choice  SECONDARY: [employer] Medicare Supplement (Plan N)      Policy#: [id number]     Phone#:   Financial Counselor:       Phone#:   The "Data Collection Information Summary" for patients in Inpatient Rehabilitation Facilities with attached "Privacy Act [place] Records" was provided and verbally reviewed with: Patient and Family  Emergency Contact Information Contact Information    Name Relation Home Work Mobile   Dorothy, Butch Penny Spouse   [phone]      Current Medical History  Patient Admitting Diagnosis: ACDF C3-4, Lami T10-11  History of Present Illness: Pt is a 79 y/o male with PMH of DM, HTN, and arthritis, who presents to [facility] with c/o progressive LE weakness x3 months.  Also notes hx of veritgo x3 months and using RW for balance with frequent falls.  Neurosurgery was consulted and pt was transferred to [facility] for workup.  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  Pt to the OR with Dr. Ronnald Ramp on 6/3.  Post op course pain management.  Therapy evaluations completed and pt was recommended for CIR.     Patient's medical record from [facility] has been reviewed by the rehabilitation admission coordinator and physician.  Past Medical History  Past Medical History:  Diagnosis Date  . Arthritis    knees  . Diabetes mellitus without complication ([place])    type II  . Dysrhythmia   . History of kidney stones    lithotripsy  . Hypertension   . Weakness of both legs 04/24/2020    Family History   family history is not on file.  Prior Rehab/Hospitalizations Has the patient had prior rehab or hospitalizations prior to admission? No  Has the patient had major surgery during 100 days prior to admission? Yes   Current Medications  Current Facility-Administered Medications:  .  0.9 %  sodium chloride infusion, 250 mL, Intravenous, Continuous, Eustace Moore, MD .  0.9 % NaCl with KCl 20 mEq/ L  infusion, , Intravenous, Continuous, Eustace Moore, MD, Last Rate: 75  mL/hr at 04/27/20 0300, Rate Verify at 04/27/20 0300 .  acetaminophen (TYLENOL) tablet 650 mg, 650 mg, Oral, Q4H PRN **OR** acetaminophen (TYLENOL) suppository 650 mg, 650 mg, Rectal, Q4H PRN, Eustace Moore, MD .  amLODipine (NORVASC) tablet 5 mg, 5 mg, Oral, QHS, Eustace Moore, MD, 5 mg at 04/26/20 2042 .  hydrALAZINE (APRESOLINE) injection 10 mg, 10 mg, Intravenous, Q6H PRN, Eustace Moore, MD .  insulin aspart (novoLOG) injection 0-15 Units, 0-15 Units, Subcutaneous, TID WC, Eustace Moore, MD, 5 Units at 04/27/20 1321 .  insulin glargine (LANTUS) injection 12 Units, 12 Units, Subcutaneous, Daily, Donne Hazel, MD, 12 Units at 04/27/20 1350 .  irbesartan (AVAPRO) tablet 300 mg, 300 mg, Oral, Daily, Eustace Moore, MD, 300 mg at 04/27/20 0949 .  menthol-cetylpyridinium (CEPACOL) lozenge 3 mg, 1 lozenge, Oral, PRN **OR** phenol  (CHLORASEPTIC) mouth spray 1 spray, 1 spray, Mouth/Throat, PRN, Eustace Moore, MD .  methocarbamol (ROBAXIN) tablet 500 mg, 500 mg, Oral, Q6H PRN **OR** methocarbamol (ROBAXIN) 500 mg in dextrose 5 % 50 mL IVPB, 500 mg, Intravenous, Q6H PRN, Eustace Moore, MD .  metoprolol succinate (TOPROL-XL) 24 hr tablet 50 mg, 50 mg, Oral, QAC breakfast, Eustace Moore, MD, 50 mg at 04/27/20 0949 .  morphine 2 MG/ML injection 2 mg, 2 mg, Intravenous, Q2H PRN, Eustace Moore, MD .  ondansetron [facility]) tablet 4 mg, 4 mg, Oral, Q6H PRN **OR** ondansetron (ZOFRAN) injection 4 mg, 4 mg, Intravenous, Q6H PRN, Eustace Moore, MD .  oxyCODONE (Oxy IR/ROXICODONE) immediate release tablet 5 mg, 5 mg, Oral, Q3H PRN, Eustace Moore, MD .  senna [facility]) tablet 8.6 mg, 1 tablet, Oral, BID, Eustace Moore, MD, 8.6 mg at 04/27/20 0949 .  sodium chloride flush (NS) 0.9 % injection 3 mL, 3 mL, Intravenous, Q12H, Eustace Moore, MD, 3 mL at 04/26/20 2255 .  sodium chloride flush (NS) 0.9 % injection 3 mL, 3 mL, Intravenous, PRN, Eustace Moore, MD .  traZODone (DESYREL) tablet 25 mg, 25 mg, Oral, QHS PRN, Eustace Moore, MD, 25 mg at 04/26/20 2044  Facility-Administered Medications Ordered in Other Encounters:  .  fentaNYL (SUBLIMAZE) injection, , , Anesthesia Intra-op, Anne Fu, CRNA, 50 mcg at 08/30/18 0701  Patients Current Diet:  Diet Order            Diet - low sodium heart healthy        Diet heart healthy/carb modified Room service appropriate? Yes with Assist; Fluid consistency: Thin  Diet effective now              Precautions / Restrictions Precautions Precautions: Fall, Back, Other (comment) Precaution Booklet Issued: Yes (comment) Precaution Comments: Ataxic gait Restrictions Weight Bearing Restrictions: No   Has the patient had 2 or more falls or a fall with injury in the past year? Yes  Prior Activity Level Community (5-7x/wk): pt active prior to admission, even with progressive LE  weakness over the last few weeks.  Has been using RW for stability 2/2 vertigo  Prior Functional Level Self Care: Did the patient need help bathing, dressing, using the toilet or eating? Independent  Indoor Mobility: Did the patient need assistance with walking from room to room (with or without device)? Independent  Stairs: Did the patient need assistance with internal or external stairs (with or without device)? Independent  Functional Cognition: Did the patient need help planning regular tasks such as shopping or remembering to take  medications? [place] / Equipment Home Assistive Devices/Equipment: Eyeglasses, Other (Comment), Walker (specify type)(front wheeled walker, libre for his glucose readings) Home Equipment: Environmental consultant - 2 wheels, Cane - single point, Wheelchair - manual, Shower seat  Prior Device Use: Indicate devices/aids used by the patient prior to current illness, exacerbation or injury? Walker  Current Functional Level Cognition  Overall Cognitive Status: Impaired/Different from baseline Current Attention Level: Selective Orientation Level: Oriented to person, Oriented to place, Disoriented to situation, Disoriented to time(inappropriate conversation at times, talks about off subject) Following Commands: Follows one step commands with increased time Safety/Judgement: Decreased awareness of safety, Decreased awareness of deficits General Comments: Acute confusion post surgery according to family. Pt with STM deficits noted, often telling me that there is another surgery planned for his back (which is inaccurate). Also difficulty following 1 step commands, i.e. repeatedly trying to get out of bed on the right side when rail was up and we were attempting to get out towards the left.     Extremity Assessment (includes Sensation/Coordination)  Upper Extremity Assessment: Generalized weakness(but functional)  Lower Extremity Assessment: Defer to PT  evaluation, RLE deficits/detail RLE Deficits / Details: ataxic RLE Coordination: decreased gross motor LLE Deficits / Details: Strength 5/5 LLE Coordination: decreased gross motor    ADLs  Overall ADL's : Needs assistance/impaired Eating/Feeding: Set up Grooming: Set up, Supervision/safety, Sitting Grooming Details (indicate cue type and reason): vc for precautions Upper Body Bathing: Set up, Supervision/ safety, Sitting Lower Body Bathing: Maximal assistance, Sit to/from stand Lower Body Bathing Details (indicate cue type and reason): unable to release RW in standing Upper Body Dressing : Minimal assistance, Sitting Lower Body Dressing: Maximal assistance, Sit to/from stand Toilet Transfer: Moderate assistance, +2 for physical assistance, Stand-pivot, Cueing for safety, Cueing for sequencing, RW, BSC(Able to take a few steps; posterior lean) Toileting- Clothing Manipulation and Hygiene: Maximal assistance Toileting - Clothing Manipulation Details (indicate cue type and reason): incontinent of BM Functional mobility during ADLs: Moderate assistance, +2 for physical assistance, Rolling walker, Cueing for safety, Cueing for sequencing General ADL Comments: Began education regarding cervical precautions    Mobility  Overal bed mobility: Needs Assistance Bed Mobility: Rolling, Sidelying to Sit Rolling: Min guard Sidelying to sit: Mod assist General bed mobility comments: vc and tactile cues for bed mobility; did not remember precautions from yesterday's session    Transfers  Overall transfer level: Needs assistance Equipment used: Rolling walker (2 wheeled) Transfers: Sit to/from Stand Sit to Stand: Mod assist, +2 safety/equipment Stand pivot transfers: Mod assist General transfer comment: ModA to stand from elevated bed height and BSC multiple times. Cues for hand/foot positioning.    Ambulation / Gait / Stairs / Wheelchair Mobility  Ambulation/Gait Ambulation/Gait assistance: Mod  assist, +2 physical assistance Gait Distance (Feet): 5 Feet Assistive device: Rolling walker (2 wheeled) Gait Pattern/deviations: Step-through pattern, Decreased dorsiflexion - right, Ataxic, Narrow base of support General Gait Details: Pt requiring heavy modA+ 2 for stability as pt continues with decreased RLE coordination and ataxic gait. Cues for sequencing/direction/wider BOS.   Gait velocity: decreased    Posture / Balance Balance Overall balance assessment: Needs assistance Sitting-balance support: Feet supported Sitting balance-Leahy Scale: Fair Standing balance support: Bilateral upper extremity supported Standing balance-Leahy Scale: Poor    Special needs/care consideration Skin surgical incisions to anterior neck and thoracic area of back and Designated visitor wife Butch Penny and Son   Previous Home Environment (from acute therapy documentation) Living Arrangements: Spouse/significant other Available Help  at Discharge: Family, Available 24 hours/day Type of Home: House Home Layout: One level Home Access: Stairs to enter Entrance Stairs-Rails: Left Entrance Stairs-Number of Steps: 3 Bathroom Shower/Tub: Walk-in shower Bathroom Toilet: Standard Bathroom Accessibility: Yes How Accessible: Accessible via walker Home Care Services: No  Discharge Living Setting Plans for Discharge Living Setting: Patient's home Type of Home at Discharge: House Discharge Home Layout: One level Discharge Home Access: Stairs to enter Entrance Stairs-Rails: Left Entrance Stairs-Number of Steps: 3 Discharge Bathroom Shower/Tub: Walk-in shower(built in seat) Discharge Bathroom Toilet: Standard Discharge Bathroom Accessibility: Yes How Accessible: Accessible via walker Does the patient have any problems obtaining your medications?: No  Social/Family/Support Systems Patient Roles: Spouse Anticipated Caregiver: spouse ([name]) and 2 sons Anticipated Caregiver's Contact Information: [name]  [phone] Ability/Limitations of Caregiver: supervision/CGA Caregiver Availability: 24/7 Discharge Plan Discussed with Primary Caregiver: Yes Is Caregiver In Agreement with Plan?: Yes Does Caregiver/Family have Issues with Lodging/Transportation while Pt is in Rehab?: No  Goals Patient/Family Goal for Rehab: PT/OT/SLP mod I to supervision Expected length of stay: 14-16 days Pt/Family Agrees to Admission and willing to participate: Yes Program Orientation Provided & Reviewed with Pt/Caregiver Including Roles  & Responsibilities: Yes  Decrease burden of Care through IP rehab admission: n/a  Possible need for SNF placement upon discharge: Not anticipated  Patient Condition: I have reviewed medical records from [facility], spoken with CM, and patient, spouse and son. I met with patient at the bedside for inpatient rehabilitation assessment.  Patient will benefit from ongoing PT, OT and SLP, can actively participate in 3 hours of therapy a day 5 days of the week, and can make measurable gains during the admission.  Patient will also benefit from the coordinated team approach during an Inpatient Acute Rehabilitation admission.  The patient will receive intensive therapy as well as Rehabilitation physician, nursing, social worker, and care management interventions.  Due to bladder management, bowel management, safety, skin/wound care, disease management, medication administration, pain management and patient education the patient requires 24 hour a day rehabilitation nursing.  The patient is currently mod assist +2 with mobility and basic ADLs.  Discharge setting and therapy post discharge at home with home health is anticipated.  Patient has agreed to participate in the Acute Inpatient Rehabilitation Program and will admit today.  Preadmission Screen Completed By:  Caitlin E Warren, PT, DPT 04/27/2020 3:41 PM ______________________________________________________________________   Discussed  status with Dr. Patel on 04/27/20  at 3:41 PM  and received approval for admission today.  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 Does the need for close, 24 hr/day Medical supervision in concert with the patient's rehab needs make it unreasonable for this patient to be served in a less intensive setting? Yes 2. Co-Morbidities requiring supervision/potential complications: DM, HTN, and arthritis 3. Due to bladder management, bowel management, safety, skin/wound care, disease management, medication administration, pain management and patient education, does the patient require 24 hr/day rehab nursing? Yes 4. Does the patient require coordinated care of a physician, rehab nurse, PT, OT, and SLP to address physical and functional deficits in the context of the above medical diagnosis(es)? Yes Addressing deficits in the following areas: balance, endurance, locomotion, strength, transferring, bowel/bladder control, bathing, dressing, toileting, cognition and psychosocial support 5. Can the patient actively participate in an intensive therapy program of at least 3 hrs of therapy 5   days a week? Yes 6. The potential for patient to make measurable gains while on inpatient rehab is excellent 7. Anticipated functional outcomes upon discharge from inpatient rehab: supervision PT, supervision OT, independent and modified independent SLP 8. Estimated rehab length of stay to reach the above functional goals is: 14-18 days. 9. Anticipated discharge destination: Home 10. Overall Rehab/Functional Prognosis: excellent   MD Signature: Ankit Patel, MD, ABPMR 

## 2020-04-27 NOTE — Progress Notes (Signed)
PMR Admission Coordinator Pre-Admission Assessment  Patient: Johnathan Barker is an 78 y.o., male MRN: [id number] DOB: 05/28/1941 Height: 6' (182.9 cm) Weight: 92.3 kg  Insurance Information HMO:     PPO:      PCP:      IPA:      80/20:      OTHER:  PRIMARY: Medicare A and B      Policy#: [id number]      Subscriber: pt CM Name:       Phone#:      Fax#:  Pre-Cert#: verified online      Employer:  Benefits:  Phone #:      Name:  Eff. Date: A 10/25/07, B 11/25/07     Deduct: $1484      Out of Pocket Max: n/a      Life Max: n/a CIR: 100%      SNF: 20 full days Outpatient: 80%     Co-Pay: 20% Home Health: 100%      Co-Pay:  DME: 80%     Co-Pay: 20% Providers: pt choice  SECONDARY: [employer] Medicare Supplement (Plan N)      Policy#: [id number]     Phone#:   Financial Counselor:       Phone#:   The "Data Collection Information Summary" for patients in Inpatient Rehabilitation Facilities with attached "Privacy Act Statement-Health Care Records" was provided and verbally reviewed with: Patient and Family  Emergency Contact Information Contact Information    Name Relation Home Work Mobile   [name], [name] Spouse   [phone]      Current Medical History  Patient Admitting Diagnosis: ACDF C3-4, Lami T10-11  History of Present Illness: Pt is a 78 y/o male with PMH of DM, HTN, and arthritis, who presents to [facility] with c/o progressive LE weakness x3 months.  Also notes hx of veritgo x3 months and using RW for balance with frequent falls.  Neurosurgery was consulted and pt was transferred to [facility] for workup.  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  Pt to the OR with Dr. Jones on 6/3.  Post op course pain management.  Therapy evaluations completed and pt was recommended for CIR.     Patient's medical record from [facility] has been reviewed by the rehabilitation admission coordinator and physician.  Past Medical History  Past Medical History:  Diagnosis Date  . Arthritis    knees  . Diabetes mellitus without complication (HCC)    type II  . Dysrhythmia   . History of kidney stones    lithotripsy  . Hypertension   . Weakness of both legs 04/24/2020    Family History   family history is not on file.  Prior Rehab/Hospitalizations Has the patient had prior rehab or hospitalizations prior to admission? No  Has the patient had major surgery during 100 days prior to admission? Yes   Current Medications  Current Facility-Administered Medications:  .  0.9 %  sodium chloride infusion, 250 mL, Intravenous, Continuous, Jones, David S, MD .  0.9 % NaCl with KCl 20 mEq/ L  infusion, , Intravenous, Continuous, Jones, David S, MD, Last Rate: 75   mL/hr at 04/27/20 0300, Rate Verify at 04/27/20 0300 .  acetaminophen (TYLENOL) tablet 650 mg, 650 mg, Oral, Q4H PRN **OR** acetaminophen (TYLENOL) suppository 650 mg, 650 mg, Rectal, Q4H PRN, Jones, David S, MD .  amLODipine (NORVASC) tablet 5 mg, 5 mg, Oral, QHS, Jones, David S, MD, 5 mg at 04/26/20 2042 .  hydrALAZINE (APRESOLINE) injection 10 mg, 10 mg, Intravenous, Q6H PRN, Jones, David S, MD .  insulin aspart (novoLOG) injection 0-15 Units, 0-15 Units, Subcutaneous, TID WC, Jones, David S, MD, 5 Units at 04/27/20 1321 .  insulin glargine (LANTUS) injection 12 Units, 12 Units, Subcutaneous, Daily, Chiu, Stephen K, MD, 12 Units at 04/27/20 1350 .  irbesartan (AVAPRO) tablet 300 mg, 300 mg, Oral, Daily, Jones, David S, MD, 300 mg at 04/27/20 0949 .  menthol-cetylpyridinium (CEPACOL) lozenge 3 mg, 1 lozenge, Oral, PRN **OR** phenol  (CHLORASEPTIC) mouth spray 1 spray, 1 spray, Mouth/Throat, PRN, Jones, David S, MD .  methocarbamol (ROBAXIN) tablet 500 mg, 500 mg, Oral, Q6H PRN **OR** methocarbamol (ROBAXIN) 500 mg in dextrose 5 % 50 mL IVPB, 500 mg, Intravenous, Q6H PRN, Jones, David S, MD .  metoprolol succinate (TOPROL-XL) 24 hr tablet 50 mg, 50 mg, Oral, QAC breakfast, Jones, David S, MD, 50 mg at 04/27/20 0949 .  morphine 2 MG/ML injection 2 mg, 2 mg, Intravenous, Q2H PRN, Jones, David S, MD .  ondansetron (ZOFRAN) tablet 4 mg, 4 mg, Oral, Q6H PRN **OR** ondansetron (ZOFRAN) injection 4 mg, 4 mg, Intravenous, Q6H PRN, Jones, David S, MD .  oxyCODONE (Oxy IR/ROXICODONE) immediate release tablet 5 mg, 5 mg, Oral, Q3H PRN, Jones, David S, MD .  senna (SENOKOT) tablet 8.6 mg, 1 tablet, Oral, BID, Jones, David S, MD, 8.6 mg at 04/27/20 0949 .  sodium chloride flush (NS) 0.9 % injection 3 mL, 3 mL, Intravenous, Q12H, Jones, David S, MD, 3 mL at 04/26/20 2255 .  sodium chloride flush (NS) 0.9 % injection 3 mL, 3 mL, Intravenous, PRN, Jones, David S, MD .  traZODone (DESYREL) tablet 25 mg, 25 mg, Oral, QHS PRN, Jones, David S, MD, 25 mg at 04/26/20 2044  Facility-Administered Medications Ordered in Other Encounters:  .  fentaNYL (SUBLIMAZE) injection, , , Anesthesia Intra-op, Blanton, Shannon M, CRNA, 50 mcg at 08/30/18 0701  Patients Current Diet:  Diet Order            Diet - low sodium heart healthy        Diet heart healthy/carb modified Room service appropriate? Yes with Assist; Fluid consistency: Thin  Diet effective now              Precautions / Restrictions Precautions Precautions: Fall, Back, Other (comment) Precaution Booklet Issued: Yes (comment) Precaution Comments: Ataxic gait Restrictions Weight Bearing Restrictions: No   Has the patient had 2 or more falls or a fall with injury in the past year? Yes  Prior Activity Level Community (5-7x/wk): pt active prior to admission, even with progressive LE  weakness over the last few weeks.  Has been using RW for stability 2/2 vertigo  Prior Functional Level Self Care: Did the patient need help bathing, dressing, using the toilet or eating? Independent  Indoor Mobility: Did the patient need assistance with walking from room to room (with or without device)? Independent  Stairs: Did the patient need assistance with internal or external stairs (with or without device)? Independent  Functional Cognition: Did the patient need help planning regular tasks such as shopping or remembering to take   medications? Independent  Home Assistive Devices / Equipment Home Assistive Devices/Equipment: Eyeglasses, Other (Comment), Walker (specify type)(front wheeled walker, libre for his glucose readings) Home Equipment: Walker - 2 wheels, Cane - single point, Wheelchair - manual, Shower seat  Prior Device Use: Indicate devices/aids used by the patient prior to current illness, exacerbation or injury? Walker  Current Functional Level Cognition  Overall Cognitive Status: Impaired/Different from baseline Current Attention Level: Selective Orientation Level: Oriented to person, Oriented to place, Disoriented to situation, Disoriented to time(inappropriate conversation at times, talks about off subject) Following Commands: Follows one step commands with increased time Safety/Judgement: Decreased awareness of safety, Decreased awareness of deficits General Comments: Acute confusion post surgery according to family. Pt with STM deficits noted, often telling me that there is another surgery planned for his back (which is inaccurate). Also difficulty following 1 step commands, i.e. repeatedly trying to get out of bed on the right side when rail was up and we were attempting to get out towards the left.     Extremity Assessment (includes Sensation/Coordination)  Upper Extremity Assessment: Generalized weakness(but functional)  Lower Extremity Assessment: Defer to PT  evaluation, RLE deficits/detail RLE Deficits / Details: ataxic RLE Coordination: decreased gross motor LLE Deficits / Details: Strength 5/5 LLE Coordination: decreased gross motor    ADLs  Overall ADL's : Needs assistance/impaired Eating/Feeding: Set up Grooming: Set up, Supervision/safety, Sitting Grooming Details (indicate cue type and reason): vc for precautions Upper Body Bathing: Set up, Supervision/ safety, Sitting Lower Body Bathing: Maximal assistance, Sit to/from stand Lower Body Bathing Details (indicate cue type and reason): unable to release RW in standing Upper Body Dressing : Minimal assistance, Sitting Lower Body Dressing: Maximal assistance, Sit to/from stand Toilet Transfer: Moderate assistance, +2 for physical assistance, Stand-pivot, Cueing for safety, Cueing for sequencing, RW, BSC(Able to take a few steps; posterior lean) Toileting- Clothing Manipulation and Hygiene: Maximal assistance Toileting - Clothing Manipulation Details (indicate cue type and reason): incontinent of BM Functional mobility during ADLs: Moderate assistance, +2 for physical assistance, Rolling walker, Cueing for safety, Cueing for sequencing General ADL Comments: Began education regarding cervical precautions    Mobility  Overal bed mobility: Needs Assistance Bed Mobility: Rolling, Sidelying to Sit Rolling: Min guard Sidelying to sit: Mod assist General bed mobility comments: vc and tactile cues for bed mobility; did not remember precautions from yesterday's session    Transfers  Overall transfer level: Needs assistance Equipment used: Rolling walker (2 wheeled) Transfers: Sit to/from Stand Sit to Stand: Mod assist, +2 safety/equipment Stand pivot transfers: Mod assist General transfer comment: ModA to stand from elevated bed height and BSC multiple times. Cues for hand/foot positioning.    Ambulation / Gait / Stairs / Wheelchair Mobility  Ambulation/Gait Ambulation/Gait assistance: Mod  assist, +2 physical assistance Gait Distance (Feet): 5 Feet Assistive device: Rolling walker (2 wheeled) Gait Pattern/deviations: Step-through pattern, Decreased dorsiflexion - right, Ataxic, Narrow base of support General Gait Details: Pt requiring heavy modA+ 2 for stability as pt continues with decreased RLE coordination and ataxic gait. Cues for sequencing/direction/wider BOS.   Gait velocity: decreased    Posture / Balance Balance Overall balance assessment: Needs assistance Sitting-balance support: Feet supported Sitting balance-Leahy Scale: Fair Standing balance support: Bilateral upper extremity supported Standing balance-Leahy Scale: Poor    Special needs/care consideration Skin surgical incisions to anterior neck and thoracic area of back and Designated visitor wife [name] and Son   Previous Home Environment (from acute therapy documentation) Living Arrangements: Spouse/significant other Available Help   at Discharge: Family, Available 24 hours/day Type of Home: House Home Layout: One level Home Access: Stairs to enter Entrance Stairs-Rails: Left Entrance Stairs-Number of Steps: 3 Bathroom Shower/Tub: Walk-in shower Bathroom Toilet: Standard Bathroom Accessibility: Yes How Accessible: Accessible via walker Home Care Services: No  Discharge Living Setting Plans for Discharge Living Setting: Patient's home Type of Home at Discharge: House Discharge Home Layout: One level Discharge Home Access: Stairs to enter Entrance Stairs-Rails: Left Entrance Stairs-Number of Steps: 3 Discharge Bathroom Shower/Tub: Walk-in shower(built in seat) Discharge Bathroom Toilet: Standard Discharge Bathroom Accessibility: Yes How Accessible: Accessible via walker Does the patient have any problems obtaining your medications?: No  Social/Family/Support Systems Patient Roles: Spouse Anticipated Caregiver: spouse ([name]) and 2 sons Anticipated Caregiver's Contact Information: [name]  [phone] Ability/Limitations of Caregiver: supervision/CGA Caregiver Availability: 24/7 Discharge Plan Discussed with Primary Caregiver: Yes Is Caregiver In Agreement with Plan?: Yes Does Caregiver/Family have Issues with Lodging/Transportation while Pt is in Rehab?: No  Goals Patient/Family Goal for Rehab: PT/OT/SLP mod I to supervision Expected length of stay: 14-16 days Pt/Family Agrees to Admission and willing to participate: Yes Program Orientation Provided & Reviewed with Pt/Caregiver Including Roles  & Responsibilities: Yes  Decrease burden of Care through IP rehab admission: n/a  Possible need for SNF placement upon discharge: Not anticipated  Patient Condition: I have reviewed medical records from [facility], spoken with CM, and patient, spouse and son. I met with patient at the bedside for inpatient rehabilitation assessment.  Patient will benefit from ongoing PT, OT and SLP, can actively participate in 3 hours of therapy a day 5 days of the week, and can make measurable gains during the admission.  Patient will also benefit from the coordinated team approach during an Inpatient Acute Rehabilitation admission.  The patient will receive intensive therapy as well as Rehabilitation physician, nursing, social worker, and care management interventions.  Due to bladder management, bowel management, safety, skin/wound care, disease management, medication administration, pain management and patient education the patient requires 24 hour a day rehabilitation nursing.  The patient is currently mod assist +2 with mobility and basic ADLs.  Discharge setting and therapy post discharge at home with home health is anticipated.  Patient has agreed to participate in the Acute Inpatient Rehabilitation Program and will admit today.  Preadmission Screen Completed By:  Caitlin E Warren, PT, DPT 04/27/2020 3:41 PM ______________________________________________________________________   Discussed  status with Dr. Patel on 04/27/20  at 3:41 PM  and received approval for admission today.  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 Does the need for close, 24 hr/day Medical supervision in concert with the patient's rehab needs make it unreasonable for this patient to be served in a less intensive setting? Yes 2. Co-Morbidities requiring supervision/potential complications: DM, HTN, and arthritis 3. Due to bladder management, bowel management, safety, skin/wound care, disease management, medication administration, pain management and patient education, does the patient require 24 hr/day rehab nursing? Yes 4. Does the patient require coordinated care of a physician, rehab nurse, PT, OT, and SLP to address physical and functional deficits in the context of the above medical diagnosis(es)? Yes Addressing deficits in the following areas: balance, endurance, locomotion, strength, transferring, bowel/bladder control, bathing, dressing, toileting, cognition and psychosocial support 5. Can the patient actively participate in an intensive therapy program of at least 3 hrs of therapy 5   and arthritis 3. Due to bladder management, bowel management, safety, skin/wound care, disease management, medication administration, pain management and patient education, does the patient require 24 hr/day rehab nursing? Yes 4. Does the patient require coordinated care of a physician, rehab nurse, PT, OT, and SLP to address physical and functional deficits in the context of the above medical diagnosis(es)? Yes Addressing deficits in the following areas: balance, endurance, locomotion, strength, transferring, bowel/bladder control, bathing, dressing, toileting, cognition and psychosocial support 5. Can the patient actively participate in an intensive therapy program of at least 3 hrs of therapy 5 days a week? Yes 6. The potential for patient to make measurable gains while on inpatient rehab is excellent 7. Anticipated functional outcomes upon discharge from inpatient rehab: supervision PT, supervision OT, independent and modified independent SLP 8. Estimated rehab length of stay to reach the above functional goals is: 14-18 days. 9. Anticipated discharge destination: Home 10. Overall Rehab/Functional Prognosis: excellent     MD Signature: Delice Lesch, MD, ABPMR

## 2020-04-27 NOTE — Progress Notes (Signed)
Inpatient Diabetes Program Recommendations  AACE/ADA: New Consensus Statement on Inpatient Glycemic Control (2015)  Target Ranges:  Prepandial:   less than 140 mg/dL      Peak postprandial:   less than 180 mg/dL (1-2 hours)      Critically ill patients:  140 - 180 mg/dL    Results for JAASIEL, HOLLYFIELD (MRN [id number]) as of 04/27/2020 11:24  Ref. Range 04/26/2020 13:47 04/26/2020 16:03 04/26/2020 20:52 04/27/2020 07:36  Glucose-Capillary Latest Ref Range: 70 - 99 mg/dL 223 (H) 269 (H) 275 (H) 230 (H)   Home DM Meds: Basaglar 200 units QHS       Amaryl 4 mg BID  Current Orders:       Novolog Moderate Correction Scale/ SSI (0-15 units) TID AC    Pt no longer on decadron  Glucose 200's on admission before steroids. Consider low weight based dosing Levemir 12-15 units.  Thanks,  Tama Headings RN, MSN, BC-ADM Inpatient Diabetes Coordinator Team Pager [phone] (8a-5p)

## 2020-04-27 NOTE — Progress Notes (Signed)
Occupational Therapy Evaluation Patient Details Name: Johnathan Barker MRN: [id number] DOB: [birth year] Today's Date: 04/27/2020    History of Present Illness Pt is a 79 y.o. M with significant PMH of HTN, diabetes mellitus type 2, who presents with increasing weakness and difficulty ambulating. 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 Now s/p decompressive anterior cervical discectomy C3-4, anterior cervical arthrodesis C3-4, and thoracic decompressive laminectomy, medial facetectomy foraminotomies T10-11 04/26/2020.   Clinical Impression   PTA, pt lived at home with his wife and was modified independent with mobility and ADL. Pt with recent falls. Pt incontinent on arrival. Alert and oriented to self and situation but not to time or president. Decreased awareness of deficits and apparent delay in problem solving and processing. Family states he has "a little problem" with his memory, but this is not his baseline. Pt requires +2 Mod A for safe ambulation and Max A with LB ADL due to abnormal LE tone and deficits listed below. Recommend rehab at CIR to maximize functional level fo independence and facilitate safe DC home with 24/7 S. Will follow acutely.     Follow Up Recommendations  CIR;Supervision/Assistance - 24 hour    Equipment Recommendations  3 in 1 bedside commode    Recommendations for Other Services Rehab consult     Precautions / Restrictions Precautions Precautions: Fall;Back;Other (comment) Precaution Booklet Issued: Yes (comment) Restrictions Weight Bearing Restrictions: No      Mobility Bed Mobility Overal bed mobility: Needs Assistance Bed Mobility: Rolling;Sidelying to Sit Rolling: Min guard Sidelying to sit: Mod assist       General bed mobility comments: vc and tactile cues for bed mobility; did not remember precautions from yesterday's PT session  Transfers Overall transfer level: Needs assistance   Transfers: Sit  to/from Stand Sit to Stand: Mod assist +2 from lower level; poor awareness of feet; impaired proprioception; vc for foot placement; narrow base of support              Balance Overall balance assessment: Needs assistance   Sitting balance-Leahy Scale: Fair       Standing balance-Leahy Scale: Poor                             ADL either performed or assessed with clinical judgement   ADL Overall ADL's : Needs assistance/impaired Eating/Feeding: Set up   Grooming: Set up;Supervision/safety;Sitting Grooming Details (indicate cue type and reason): vc for precautions Upper Body Bathing: Set up;Supervision/ safety;Sitting   Lower Body Bathing: Maximal assistance;Sit to/from stand Lower Body Bathing Details (indicate cue type and reason): unable to release RW in standing Upper Body Dressing : Minimal assistance;Sitting   Lower Body Dressing: Maximal assistance;Sit to/from stand   Toilet Transfer: Moderate assistance;+2 for physical assistance;Stand-pivot;Cueing for safety;Cueing for sequencing;RW;BSC(Able to take a few steps; posterior lean)   Toileting- Clothing Manipulation and Hygiene: Maximal assistance Toileting - Clothing Manipulation Details (indicate cue type and reason): incontinent of BM     Functional mobility during ADLs: Moderate assistance;+2 for physical assistance;Rolling walker;Cueing for safety;Cueing for sequencing General ADL Comments: Began education regarding cervical precautions     Vision         Perception     Praxis      Pertinent Vitals/Pain Pain Assessment: Faces Faces Pain Scale: Hurts little more Pain Location: neck/throat Pain Descriptors / Indicators: Grimacing;Guarding Pain Intervention(s): Limited activity within patient's tolerance     Hand Dominance Right  Extremity/Trunk Assessment Upper Extremity Assessment Upper Extremity Assessment: Generalized weakness(but functional)   Lower Extremity Assessment Lower  Extremity Assessment: Defer to PT evaluation;RLE deficits/detail RLE Deficits / Details: ataxic   Cervical / Trunk Assessment Cervical / Trunk Assessment: Kyphotic;Other exceptions(cervical and back surgery) Cervical / Trunk Exceptions: s/p ACDF, thoracic laminectomy   Communication     Cognition Arousal/Alertness: Awake/alert Behavior During Therapy: WFL for tasks assessed/performed Overall Cognitive Status: Impaired/Different from baseline Area of Impairment: Orientation;Attention;Memory;Safety/judgement;Awareness;Problem solving                 Orientation Level: Disoriented to;Time(June 21rst;l Sherley Bounds is president) Current Attention Level: Selective Memory: Decreased recall of precautions;Decreased short-term memory Following Commands: Follows one step commands with increased time Safety/Judgement: Decreased awareness of safety;Decreased awareness of deficits Awareness: Emergent Problem Solving: Slow processing;Difficulty sequencing     General Comments  Family present during session    Exercises     Shoulder Instructions      Home Living Family/patient expects to be discharged to:: Inpatient rehab Living Arrangements: Spouse/significant other Available Help at Discharge: Family;Available 24 hours/day Type of Home: House Home Access: Stairs to enter [employer] of Steps: 3 Entrance Stairs-Rails: Left Home Layout: One level     Bathroom Shower/Tub: Occupational psychologist: Standard Bathroom Accessibility: Yes How Accessible: Accessible via walker Home Equipment: [place] - 2 wheels;Cane - single point;Wheelchair - manual;Shower seat          Prior Functioning/Environment Level of Independence: Needs assistance  Gait / Transfers Assistance Needed: uses walker ADL's / Homemaking Assistance Needed: intermittent assist with ADL's            OT Problem List: Decreased strength;Decreased activity tolerance;Impaired balance  (sitting and/or standing);Decreased safety awareness;Decreased cognition;Decreased knowledge of use of DME or AE;Decreased knowledge of precautions;Impaired tone;Pain      OT Treatment/Interventions: Self-care/ADL training;Therapeutic exercise;Neuromuscular education;DME and/or AE instruction;Therapeutic activities;Cognitive remediation/compensation;Patient/family education;Balance training    OT Goals(Current goals can be found in the care plan section) Acute Rehab OT Goals Patient Stated Goal: "go to intensive rehab." OT Goal Formulation: With patient/family Time For Goal Achievement: 05/11/20 Potential to Achieve Goals: Good  OT Frequency: Min 2X/week   Barriers to D/C:            Co-evaluation PT/OT/SLP Co-Evaluation/Treatment: Yes Reason for Co-Treatment: Complexity of the patient's impairments (multi-system involvement);For patient/therapist safety;To address functional/ADL transfers   OT goals addressed during session: ADL's and self-care      AM-PAC OT "6 Clicks" Daily Activity     Outcome Measure Help from another person eating meals?: A Little Help from another person taking care of personal grooming?: A Little Help from another person toileting, which includes using toliet, bedpan, or urinal?: A Lot Help from another person bathing (including washing, rinsing, drying)?: A Lot Help from another person to put on and taking off regular upper body clothing?: A Little Help from another person to put on and taking off regular lower body clothing?: A Lot 6 Click Score: 15   End of Session Equipment Utilized During Treatment: Gait belt;Rolling walker Nurse Communication: Mobility status  Activity Tolerance: Patient tolerated treatment well Patient left: in chair;with call bell/phone within reach;with chair alarm set;with family/visitor present  OT Visit Diagnosis: Unsteadiness on feet (R26.81);Other abnormalities of gait and mobility (R26.89);Muscle weakness (generalized)  (M62.81);History of falling (Z91.81);Other symptoms and signs involving cognitive function;Pain;Ataxia, unspecified (R27.0) Pain - part of body: (neck/back)  Time: 1110-1146 OT Time Calculation (min): 36 min Charges:  OT General Charges $OT Visit: 1 Visit OT Evaluation $OT Eval Moderate Complexity: [place], OT/L   Acute OT Clinical Specialist Acute Rehabilitation Services Pager [phone] Office [phone]   [facility] 04/27/2020, 1:16 PM

## 2020-04-27 NOTE — Progress Notes (Signed)
Pt admitted to [phone]. Family is at bedside. Pt alert. Pt's family reports patient has not had urinary episode since foley was removed. BS was for 550 and cath was 650. Pt left with call bell within reach and report given to [place].

## 2020-04-28 ENCOUNTER — Inpatient Hospital Stay (HOSPITAL_COMMUNITY): Payer: Medicare Other | Admitting: Physical Therapy

## 2020-04-28 ENCOUNTER — Inpatient Hospital Stay (HOSPITAL_COMMUNITY): Payer: Medicare Other

## 2020-04-28 ENCOUNTER — Inpatient Hospital Stay (HOSPITAL_COMMUNITY): Payer: Medicare Other | Admitting: Speech Pathology

## 2020-04-28 ENCOUNTER — Inpatient Hospital Stay (HOSPITAL_COMMUNITY): Payer: Medicare Other | Admitting: Occupational Therapy

## 2020-04-28 DIAGNOSIS — G8918 Other acute postprocedural pain: Secondary | ICD-10-CM

## 2020-04-28 DIAGNOSIS — R9389 Abnormal findings on diagnostic imaging of other specified body structures: Secondary | ICD-10-CM

## 2020-04-28 DIAGNOSIS — R339 Retention of urine, unspecified: Secondary | ICD-10-CM

## 2020-04-28 DIAGNOSIS — R509 Fever, unspecified: Secondary | ICD-10-CM

## 2020-04-28 DIAGNOSIS — R1310 Dysphagia, unspecified: Secondary | ICD-10-CM

## 2020-04-28 DIAGNOSIS — G959 Disease of spinal cord, unspecified: Secondary | ICD-10-CM

## 2020-04-28 DIAGNOSIS — Z794 Long term (current) use of insulin: Secondary | ICD-10-CM

## 2020-04-28 DIAGNOSIS — E1165 Type 2 diabetes mellitus with hyperglycemia: Secondary | ICD-10-CM

## 2020-04-28 DIAGNOSIS — K5900 Constipation, unspecified: Secondary | ICD-10-CM

## 2020-04-28 DIAGNOSIS — I10 Essential (primary) hypertension: Secondary | ICD-10-CM

## 2020-04-28 DIAGNOSIS — S14109S Unspecified injury at unspecified level of cervical spinal cord, sequela: Secondary | ICD-10-CM

## 2020-04-28 LAB — CBC WITH DIFFERENTIAL/PLATELET
Abs Immature Granulocytes: 0.07 10*3/uL (ref 0.00–0.07)
Basophils Absolute: 0 10*3/uL (ref 0.0–0.1)
Basophils Relative: 0 %
Eosinophils Absolute: 0 10*3/uL (ref 0.0–0.5)
Eosinophils Relative: 0 %
HCT: 43.4 % (ref 39.0–52.0)
Hemoglobin: 14.7 g/dL (ref 13.0–17.0)
Immature Granulocytes: 0 %
Lymphocytes Relative: 5 %
Lymphs Abs: 0.9 10*3/uL (ref 0.7–4.0)
MCH: 30.9 pg (ref 26.0–34.0)
MCHC: 33.9 g/dL (ref 30.0–36.0)
MCV: 91.2 fL (ref 80.0–100.0)
Monocytes Absolute: 1.8 10*3/uL — ABNORMAL HIGH (ref 0.1–1.0)
Monocytes Relative: 10 %
Neutro Abs: 14.7 10*3/uL — ABNORMAL HIGH (ref 1.7–7.7)
Neutrophils Relative %: 85 %
Platelets: 310 10*3/uL (ref 150–400)
RBC: 4.76 MIL/uL (ref 4.22–5.81)
RDW: 12.9 % (ref 11.5–15.5)
WBC: 17.5 10*3/uL — ABNORMAL HIGH (ref 4.0–10.5)
nRBC: 0 % (ref 0.0–0.2)

## 2020-04-28 LAB — URINALYSIS, ROUTINE W REFLEX MICROSCOPIC
Bacteria, UA: NONE SEEN
Bilirubin Urine: NEGATIVE
Glucose, UA: >=500 mg/dL — AB
Ketones, ur: 20 mg/dL — AB
Leukocytes,Ua: NEGATIVE
Nitrite: NEGATIVE
Protein, ur: 100 mg/dL — AB
Specific Gravity, Urine: 1.02 (ref 1.005–1.030)
pH: 5 (ref 5.0–8.0)

## 2020-04-28 LAB — COMPREHENSIVE METABOLIC PANEL
ALT: 14 U/L (ref 0–44)
AST: 17 U/L (ref 15–41)
Albumin: 2.9 g/dL — ABNORMAL LOW (ref 3.5–5.0)
Alkaline Phosphatase: 77 U/L (ref 38–126)
Anion gap: 11 (ref 5–15)
BUN: 13 mg/dL (ref 8–23)
CO2: 26 mmol/L (ref 22–32)
Calcium: 8.6 mg/dL — ABNORMAL LOW (ref 8.9–10.3)
Chloride: 99 mmol/L (ref 98–111)
Creatinine, Ser: 0.96 mg/dL (ref 0.61–1.24)
GFR calc Af Amer: >60 mL/min (ref >60)
GFR calc non Af Amer: >60 mL/min (ref >60)
Glucose, Bld: 211 mg/dL — ABNORMAL HIGH (ref 70–99)
Potassium: 4.2 mmol/L (ref 3.5–5.1)
Sodium: 136 mmol/L (ref 135–145)
Total Bilirubin: 3 mg/dL — ABNORMAL HIGH (ref 0.3–1.2)
Total Protein: 6.3 g/dL — ABNORMAL LOW (ref 6.5–8.1)

## 2020-04-28 LAB — GLUCOSE, CAPILLARY
Glucose-Capillary: 181 mg/dL — ABNORMAL HIGH (ref 70–99)
Glucose-Capillary: 228 mg/dL — ABNORMAL HIGH (ref 70–99)
Glucose-Capillary: 177 mg/dL — ABNORMAL HIGH (ref 70–99)
Glucose-Capillary: 222 mg/dL — ABNORMAL HIGH (ref 70–99)

## 2020-04-28 LAB — VITAMIN B1: Vitamin B1 (Thiamine): 182.8 nmol/L (ref 66.5–200.0)

## 2020-04-28 IMAGING — CR DG CHEST 2V
2 series · 2 of 2 positions shown · non-contrast
Comparison: [DATE]

CLINICAL DATA: Productive cough, patient coughing up copious
amounts of yellow mucus. Hx of diabetes, hypertension. Nonsmoker.

EXAM:
CHEST - 2 VIEW

[chest lat]
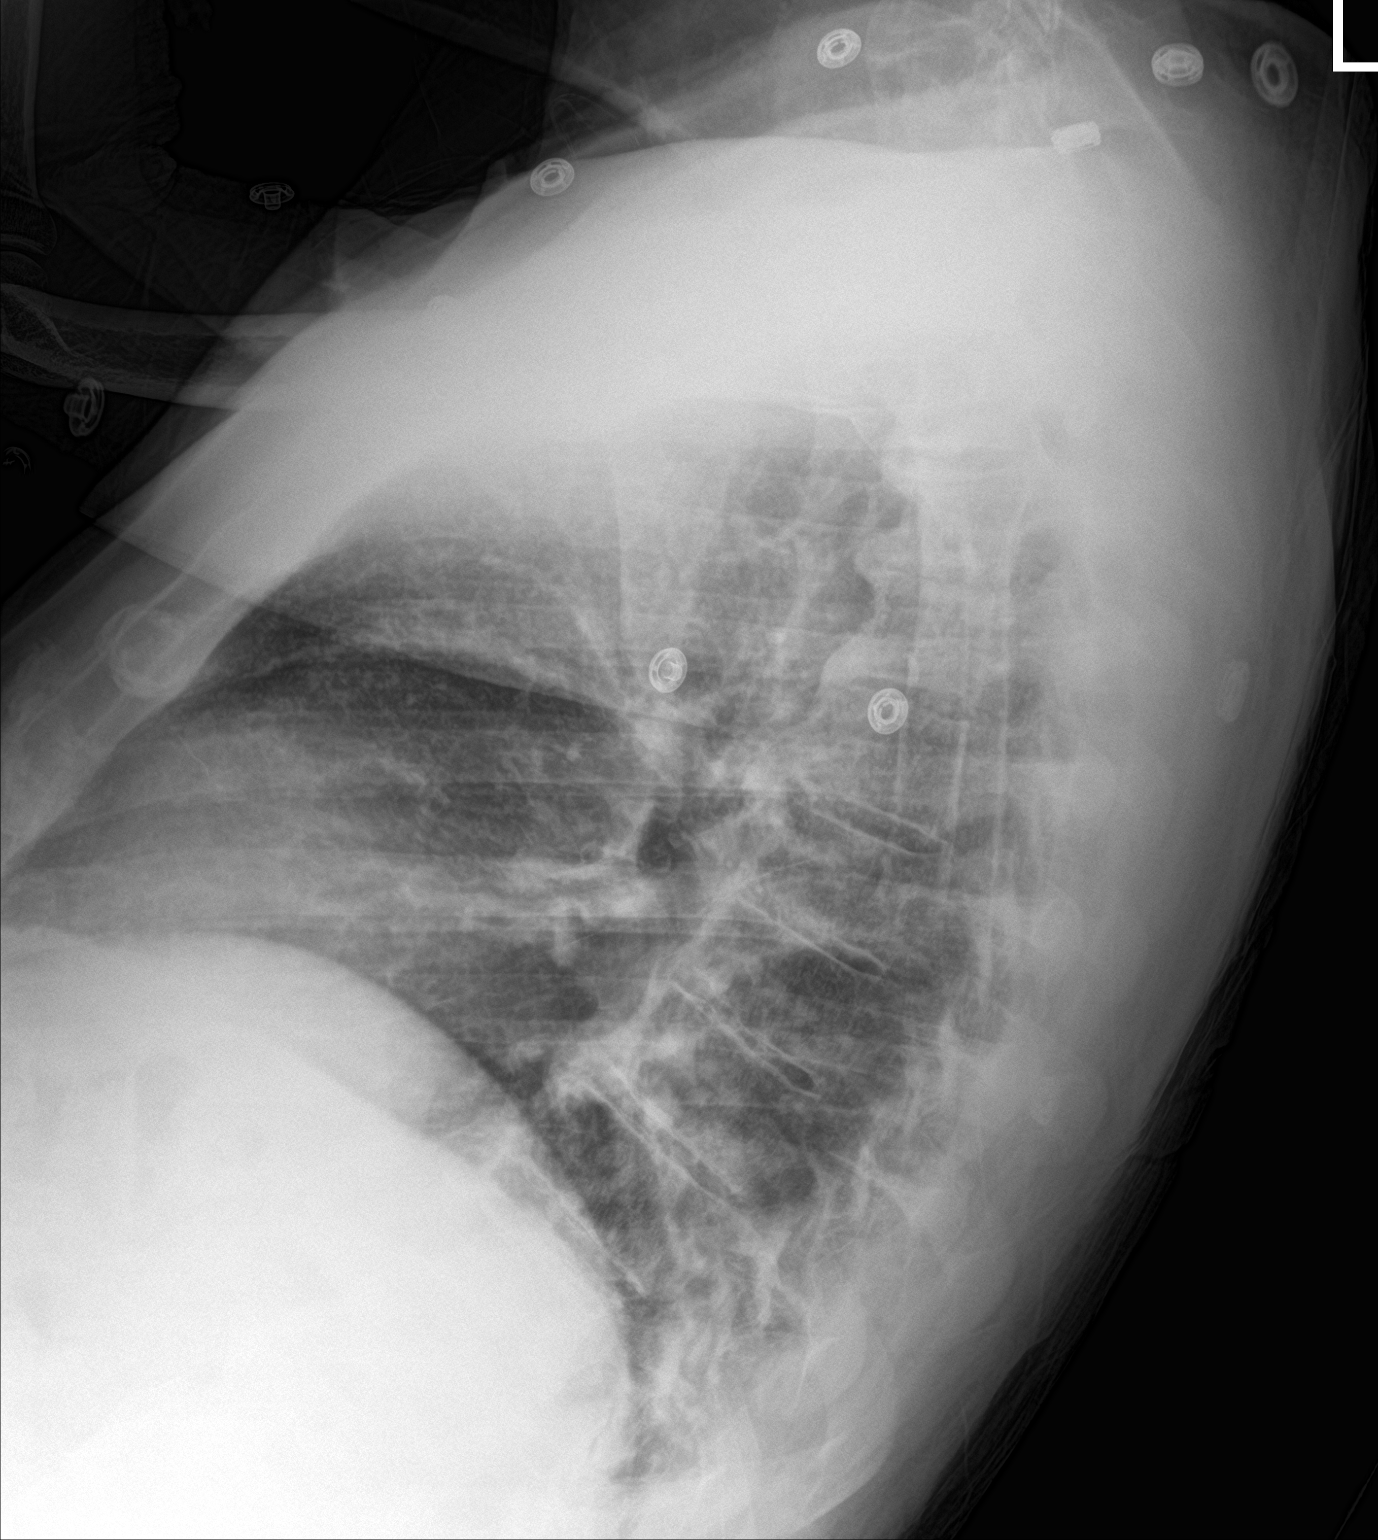

[chest ap]
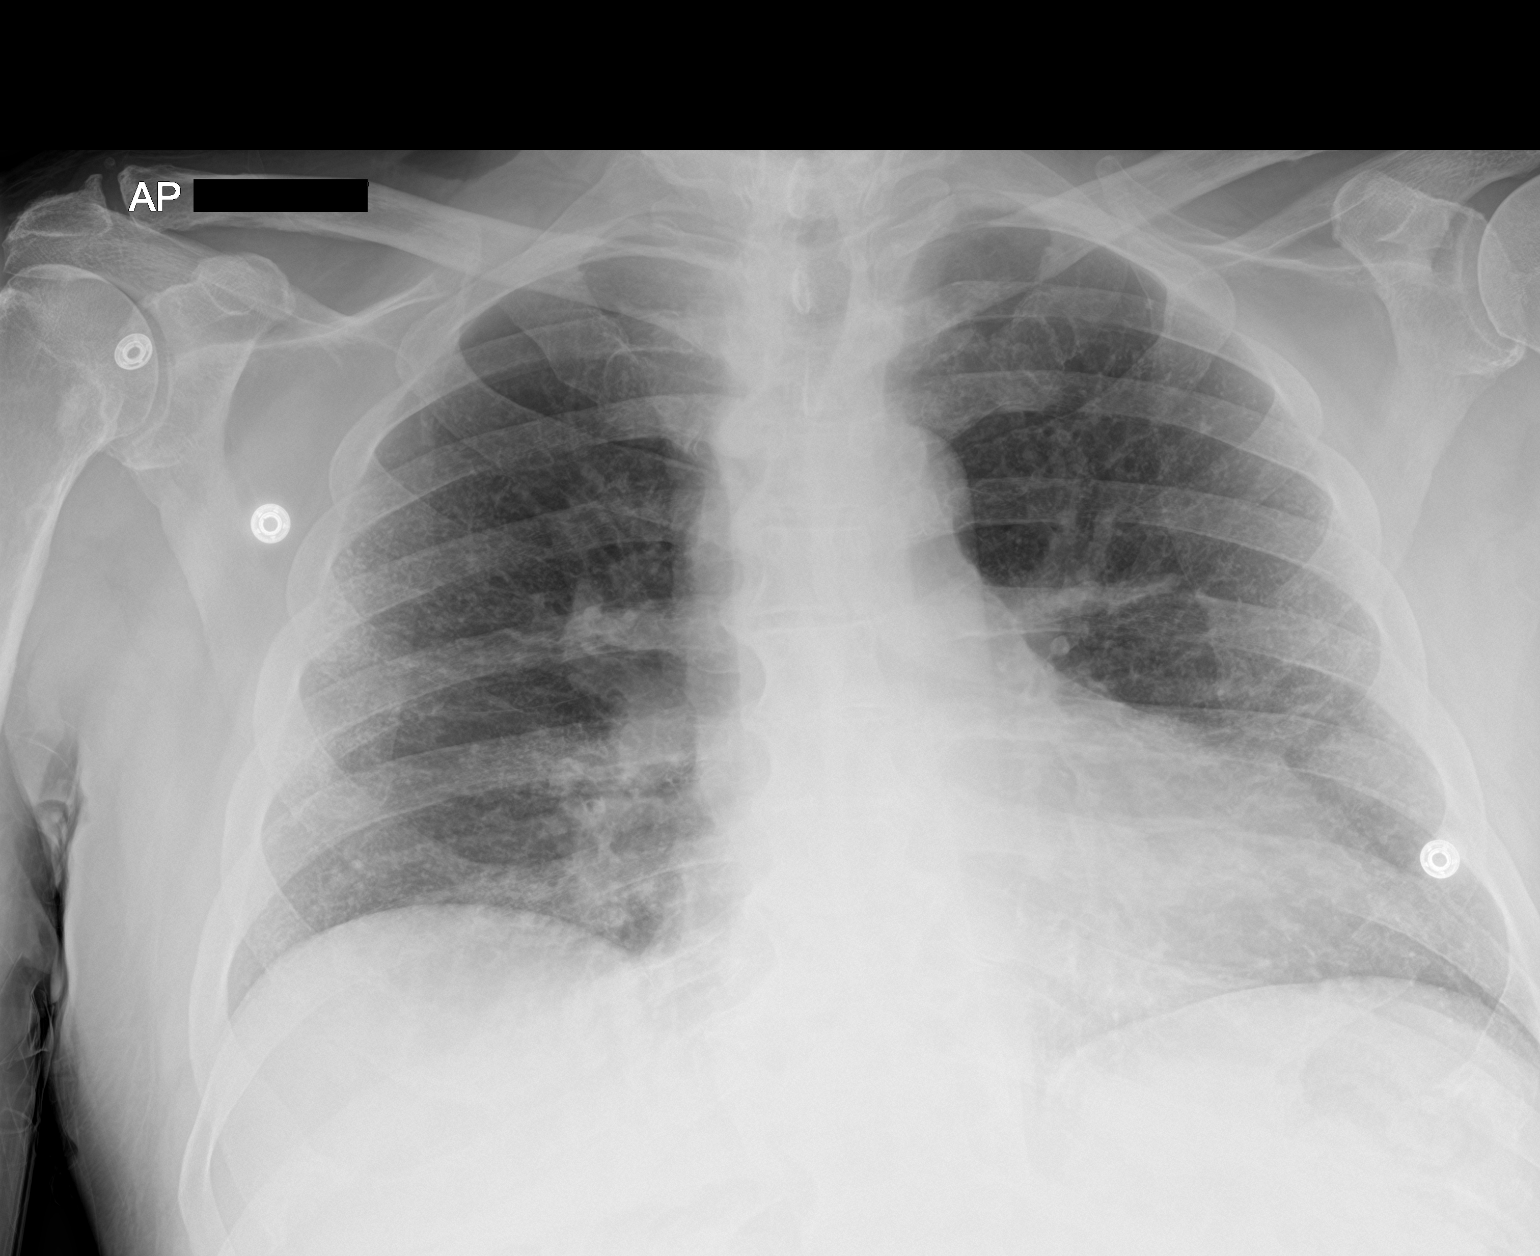

[2 of 2 positions shown; findings below may reference images not displayed]

FINDINGS: Relatively low volumes. Mild peripheral and basilar interstitial
somewhat reticular opacities as before. Some increase in coarse
airspace opacities versus atelectasis in the posterior right lower
lobe.

Heart size upper limits normal.  No effusion.

No pneumothorax.

Visualized bones unremarkable.
IMPRESSION: Worsening posterior right lower lobe airspace disease

## 2020-04-28 IMAGING — DX DG ABDOMEN 1V
1 series · 1 of 1 positions shown · non-contrast
Comparison: None.

CLINICAL DATA: Nasogastric tube placement.

EXAM:
ABDOMEN - 1 VIEW

[abdomen kub]
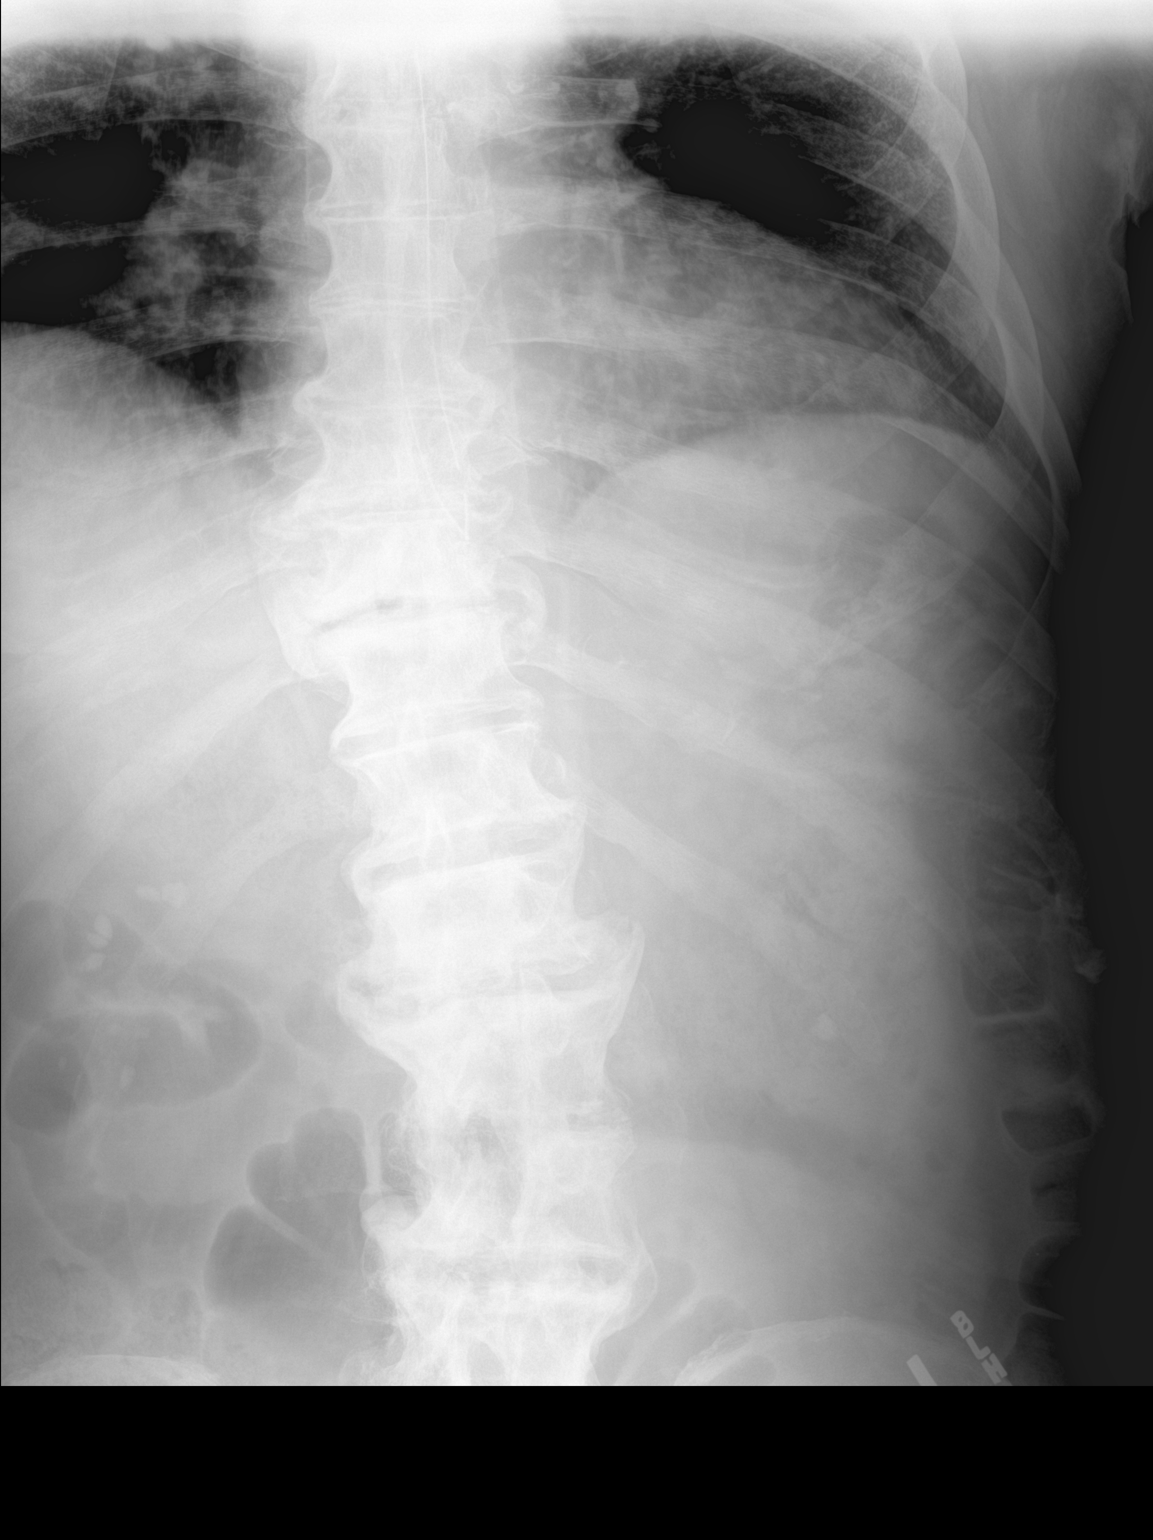

[1 of 1 positions shown; findings below may reference images not displayed]

FINDINGS: A nasogastric tube is seen. Its distal end is looped within the
distal aspect of the esophagus. The bowel gas pattern is normal.
Numerous subcentimeter soft tissue calcifications are seen overlying
in the expected region of the right kidney. An additional 6 mm soft
tissue calcification is seen projecting over the expected region of
the lower pole of the left kidney. Multilevel marked severity
degenerative changes seen throughout the lumbar spine.
IMPRESSION: 1. Nasogastric tube looped within the distal esophagus.
Repositioning is recommended.
2. Bilateral renal calculi.
3. No evidence of bowel obstruction or ileus.

## 2020-04-28 IMAGING — DX DG ABDOMEN 1V
1 series · 1 of 1 positions shown · non-contrast
Comparison: [DATE] [DATE], [DATE] ([DATE] p.m.)

CLINICAL DATA: Follow-up nasogastric tube placement.

EXAM:
ABDOMEN - 1 VIEW

[abdomen]
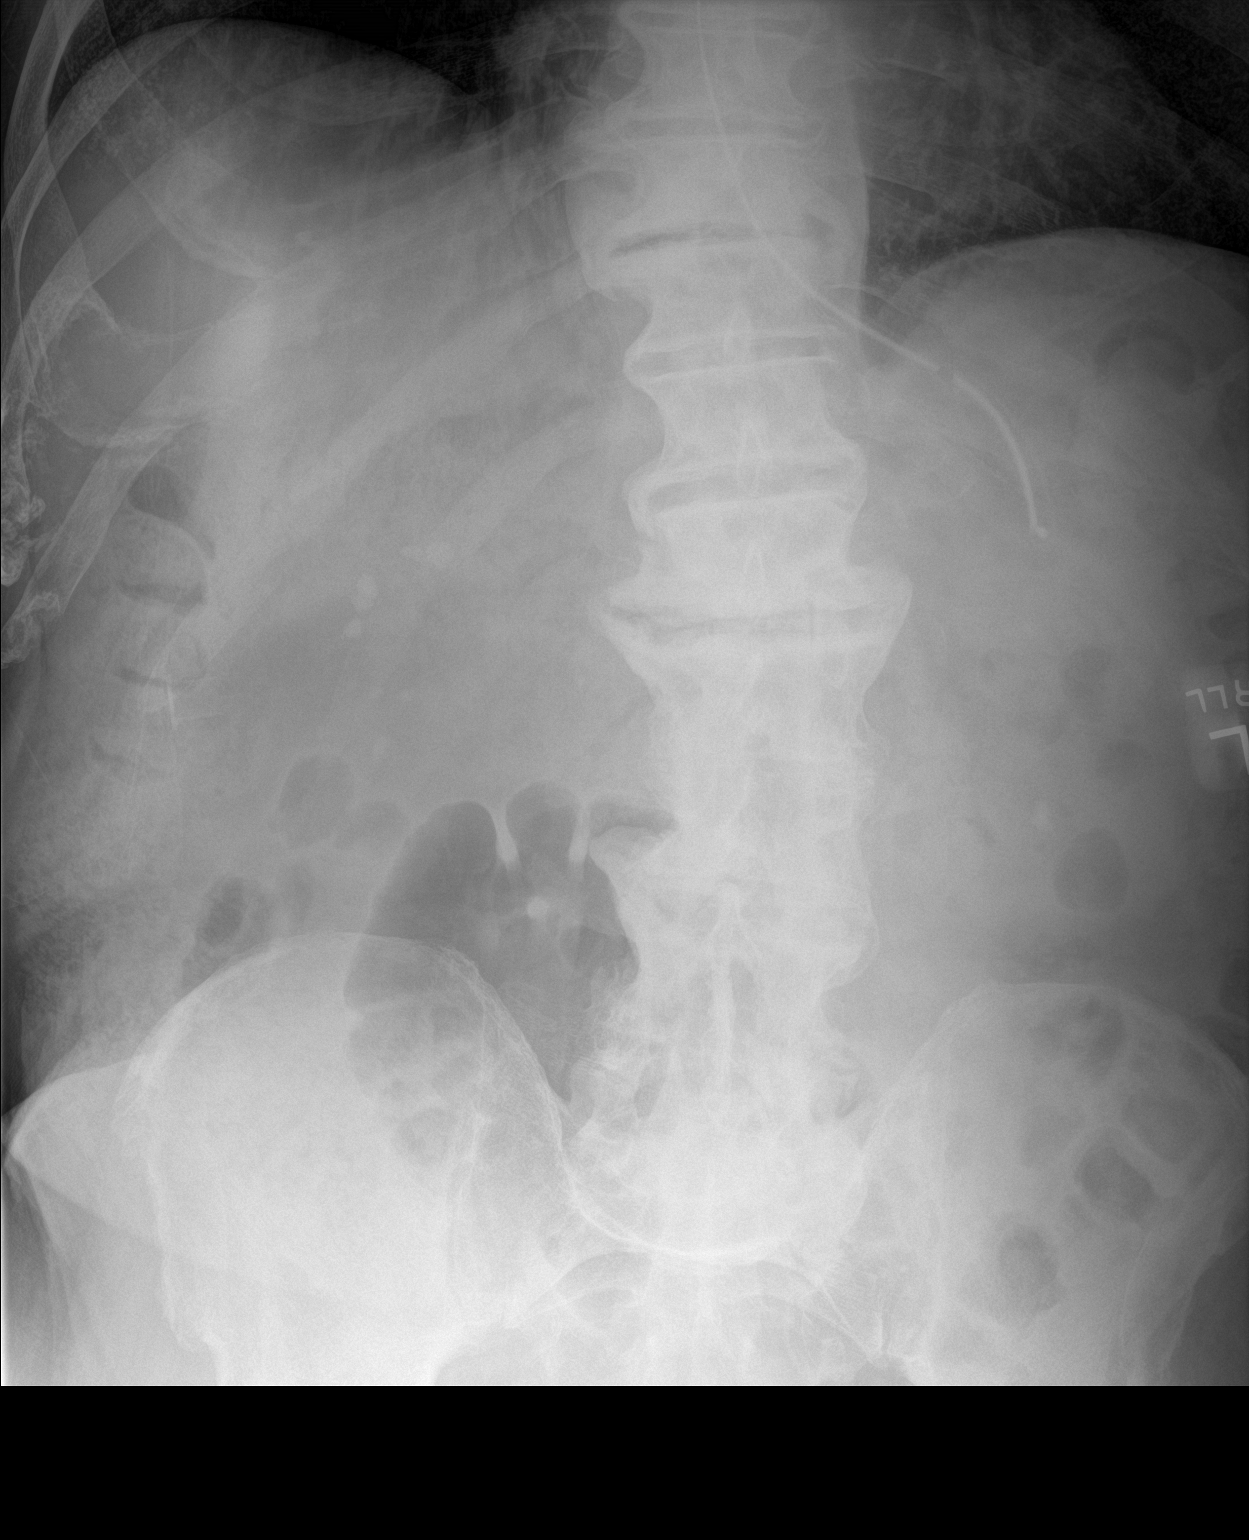

[1 of 1 positions shown; findings below may reference images not displayed]

FINDINGS: A nasogastric tube is seen with its distal tip noted within the body
of the stomach. This is approximately 8 cm distal to the
gastroesophageal junction. The bowel gas pattern is normal. Multiple
bilateral small renal calculi are noted.
IMPRESSION: 1. Nasogastric tube positioning, as described above. Further
advancement of the NG tube by approximately 5 cm is recommended to
further decrease the risk of aspiration.
2. Multiple bilateral small renal calculi.

## 2020-04-28 IMAGING — DX DG ABDOMEN 1V
1 series · 1 of 1 positions shown · non-contrast
Comparison: Radiograph [DATE]

CLINICAL DATA: NG tube placement

EXAM:
ABDOMEN - 1 VIEW

[abdomen]
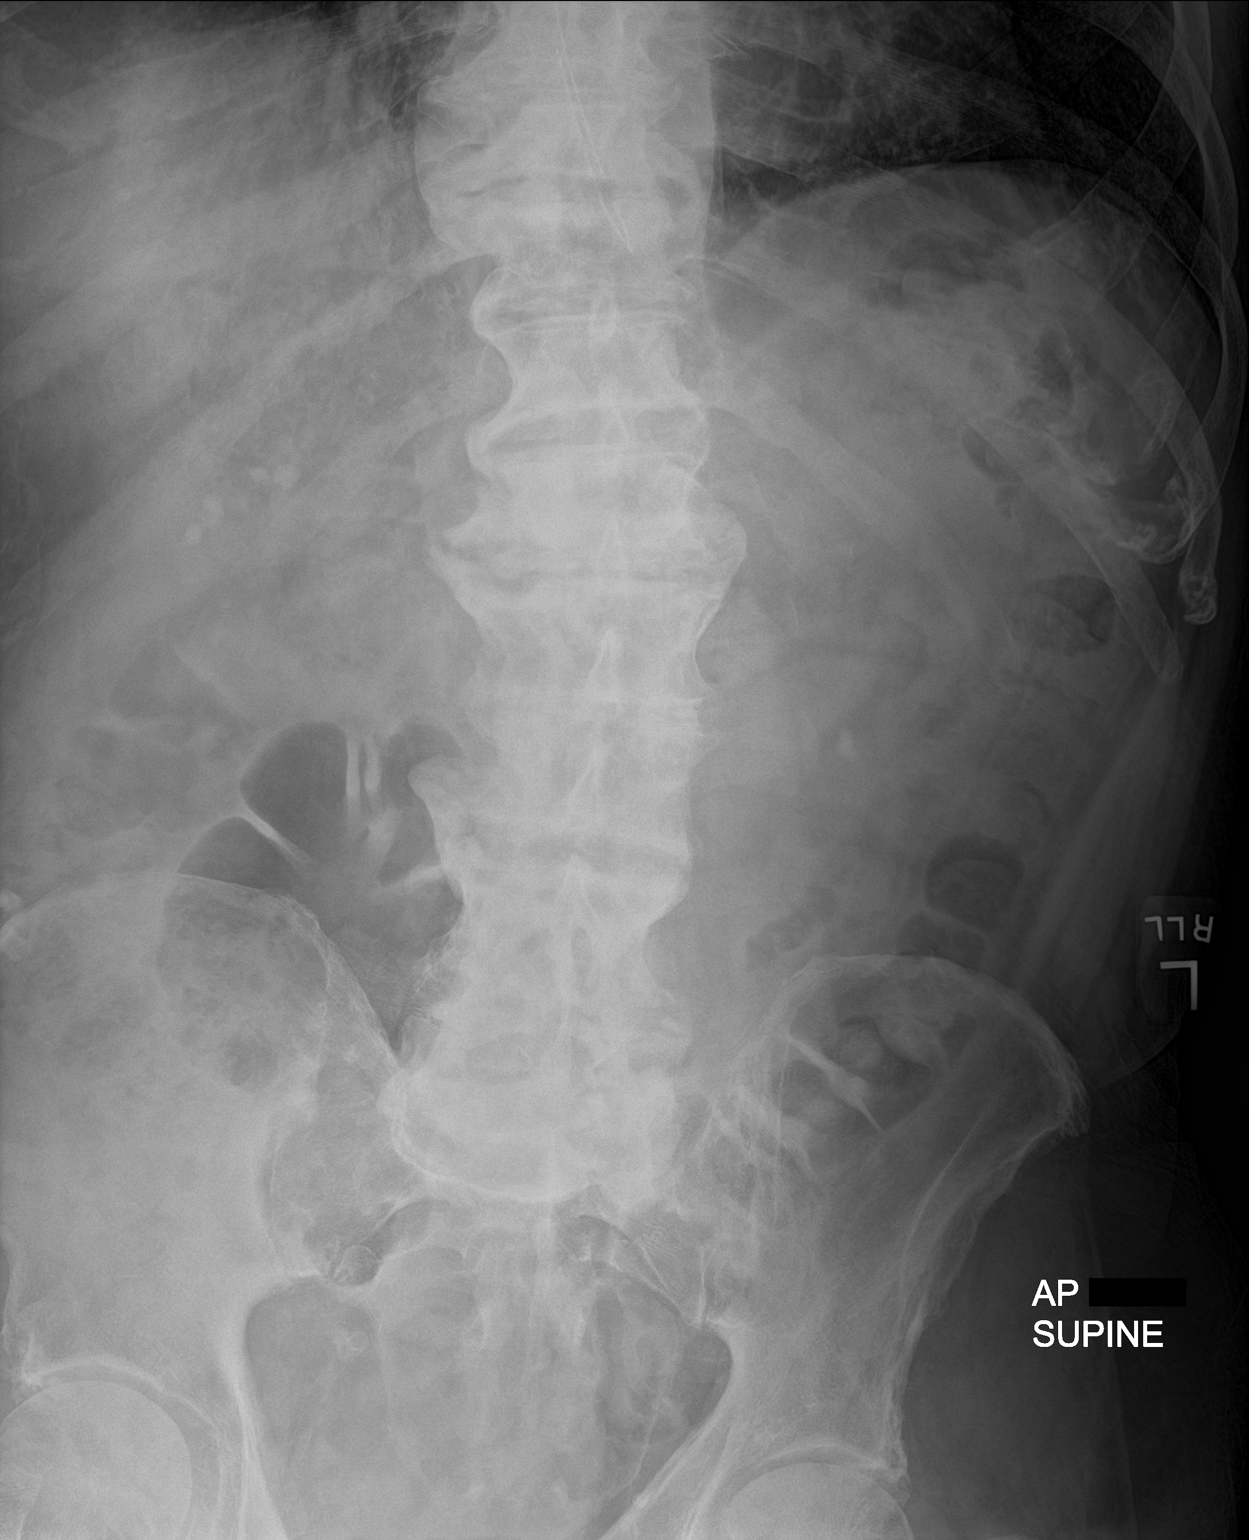

[1 of 1 positions shown; findings below may reference images not displayed]

FINDINGS: Nasogastric tube again seen curling in the lower thoracic esophagus,
redirected superiorly. Redemonstration of a single air distended
loop of what appears to be colon in the mid to lower abdomen. Is
unchanged in appearance from comparison study. Bowel gas pattern is
otherwise unremarkable. Calcifications overlie the bilateral renal
shadows, predominantly upper pole of the right lower pole on the
left. Severe multilevel discogenic change throughout the spine with
exuberant osteophyte formation. Additional degenerative changes of
the SI joints and both hips.
IMPRESSION: 1. Stable appearance of a single air distended loop of what appears
to be colon in the mid to lower abdomen. No high-grade obstructive
pattern.
2. Nasogastric tube remains curled in the lower thoracic esophagus,
redirected superiorly. Recommend repositioning.
3. Bilateral nephrolithiasis.

## 2020-04-28 MED ORDER — SODIUM CHLORIDE 0.9 % IV SOLN: INTRAVENOUS | Status: DC

## 2020-04-28 MED ORDER — AMLODIPINE BESYLATE 5 MG PO TABS
5.0000 mg | ORAL_TABLET | Freq: Every day | ORAL | Status: DC
  Administered 2020-04-29 – 2020-05-17 (×20): 5 mg
  Filled 2020-04-28 (×21): qty 1

## 2020-04-28 MED ORDER — SODIUM CHLORIDE 0.9 % IV SOLN
2.0000 g | Freq: Three times a day (TID) | INTRAVENOUS | Status: AC
  Administered 2020-04-28 – 2020-05-08 (×30): 2 g via INTRAVENOUS
  Filled 2020-04-28 (×31): qty 2

## 2020-04-28 MED ORDER — METHYLPREDNISOLONE SODIUM SUCC 125 MG IJ SOLR
40.0000 mg | Freq: Every day | INTRAMUSCULAR | Status: DC
  Administered 2020-05-01: 40 mg via INTRAVENOUS
  Filled 2020-04-28 (×2): qty 2

## 2020-04-28 MED ORDER — METRONIDAZOLE IN NACL 5-0.79 MG/ML-% IV SOLN
500.0000 mg | Freq: Three times a day (TID) | INTRAVENOUS | Status: DC
  Administered 2020-04-28 – 2020-05-02 (×12): 500 mg via INTRAVENOUS
  Filled 2020-04-28 (×16): qty 100

## 2020-04-28 MED ORDER — FAMOTIDINE 20 MG PO TABS
10.0000 mg | ORAL_TABLET | Freq: Two times a day (BID) | ORAL | Status: DC
  Administered 2020-04-29 – 2020-05-18 (×40): 10 mg
  Filled 2020-04-28 (×40): qty 1

## 2020-04-28 MED ORDER — METHYLPREDNISOLONE SODIUM SUCC 40 MG IJ SOLR
40.0000 mg | Freq: Two times a day (BID) | INTRAMUSCULAR | Status: AC
  Administered 2020-04-28 – 2020-04-30 (×4): 40 mg via INTRAVENOUS
  Filled 2020-04-28 (×4): qty 1

## 2020-04-28 MED ORDER — METOPROLOL TARTRATE 50 MG PO TABS
50.0000 mg | ORAL_TABLET | Freq: Two times a day (BID) | ORAL | Status: DC
  Administered 2020-04-29 – 2020-05-18 (×39): 50 mg
  Filled 2020-04-28 (×40): qty 1

## 2020-04-28 MED ORDER — METHYLPREDNISOLONE SODIUM SUCC 125 MG IJ SOLR
40.0000 mg | Freq: Two times a day (BID) | INTRAMUSCULAR | Status: DC
  Filled 2020-04-28: qty 2

## 2020-04-28 NOTE — Progress Notes (Signed)
I was called to the patient room by RN stating that after patient took meds with apple sauce patient staring to cough and RN was concerned that patient had aspirated, upon ausculting the patient lung fields, it Sounded clear, patient had a lot of secretions in the back of his throat which I was able to suction out with a yaunker, patient sounded better after suctioning.

## 2020-04-28 NOTE — Progress Notes (Signed)
   04/28/20 1000  Assess: MEWS Score  Temp (!) 101 F (38.3 C)  BP (!) 160/75  Pulse Rate (!) 105  Resp (!) 22  Level of Consciousness Alert  SpO2 96 %  O2 Device Room Air  Patient Activity (if Appropriate) In chair  Assess: MEWS Score  MEWS Temp 1  MEWS Systolic 0  MEWS Pulse 1  MEWS RR 1  MEWS LOC 0  MEWS Score 3  MEWS Score Color Yellow  Assess: if the MEWS score is Yellow or Red  Were vital signs taken at a resting state? Yes  Focused Assessment Documented focused assessment  Early Detection of Sepsis Score *See Row Information* High  MEWS guidelines implemented *See Row Information* Yes  Treat  MEWS Interventions Other (Comment) (Ordered recieved by the MD)  Take Vital Signs  Increase Vital Sign Frequency  Yellow: Q 2hr X 2 then Q 4hr X 2, if remains yellow, continue Q 4hrs  Escalate  MEWS: Escalate Yellow: discuss with charge nurse/RN and consider discussing with provider and RRT  Notify: Charge Nurse/RN  Name of Charge Nurse/RN Notified [employer]  Date Charge Nurse/RN Notified 04/28/20  Time Charge Nurse/RN Notified 1000  Notify: Provider  Provider Name/Title Dr. Posey Pronto  Date Provider Notified 04/28/20  Time Provider Notified 1000  Notification Type Call  Notification Reason Change in status  Response See new orders  Date of Provider Response 04/28/20  Time of Provider Response 1000  Document  Progress note created (see row info) Yes

## 2020-04-28 NOTE — Progress Notes (Addendum)
[place] PHYSICAL MEDICINE & REHABILITATION PROGRESS NOTE  Subjective/Complaints: Patient seen sitting up in bed this morning.  He states he slept well overnight.  He states he is ready to begin therapies.  Later, informed by nursing regarding fever.  Work-up ordered, also communicated with therapies regarding evaluation.  Made n.p.o., NG ordered.  ROS: Denies CP shortness of breath, nausea, vomiting, diarrhea.  Objective: Vital Signs: Blood pressure (!) 161/70, pulse (!) 103, temperature 98.5 F (36.9 C), temperature source Oral, resp. rate 18, height 6' (1.829 m), weight 92.5 kg, SpO2 94 %. DG Chest 2 View  Result Date: 04/27/2020 CLINICAL DATA:  Wheezing.  Cough. EXAM: CHEST - 2 VIEW COMPARISON:  None. FINDINGS: Low lung volumes. Upper normal heart size with normal mediastinal contours. There are fine reticular opacities in a peripheral and basilar predominant distribution. No confluent airspace disease. No pleural effusion or pneumothorax. Degenerative change in the spine. No acute osseous abnormalities are seen. IMPRESSION: Fine reticular opacities in a peripheral and basilar predominant distribution, suspicious for interstitial lung disease. Possibility of atypical infection is also considered. Consider follow-up high-resolution chest CT. Electronically Signed   By: Keith Rake M.D.   On: 04/27/2020 20:51   DG Cervical Spine 2-3 Views  Result Date: 04/26/2020 CLINICAL DATA:  C3-4 ACDF and T10-11 laminectomy EXAM: CERVICAL SPINE - 2-3 VIEW; THORACOLUMBAR SPINE - 2 VIEW; DG C-ARM 1-60 MIN COMPARISON:  MRI 04/24/2020 FINDINGS: 4 C-arm fluoroscopic images were obtained intraoperatively and submitted for post operative interpretation. Intraoperative fluoroscopic images demonstrate placement of ACDF hardware at the C3-4 level. Additional images demonstrate surgical instrumentation in the lower thoracic spine the T10-11 level. A total of 24.5 seconds of fluoroscopy time was utilized. Please see  the performing provider's procedural report for further detail. IMPRESSION: As above. Electronically Signed   By: Davina Poke D.O.   On: 04/26/2020 13:20   DG THORACOLUMABAR SPINE  Result Date: 04/26/2020 CLINICAL DATA:  C3-4 ACDF and T10-11 laminectomy EXAM: CERVICAL SPINE - 2-3 VIEW; THORACOLUMBAR SPINE - 2 VIEW; DG C-ARM 1-60 MIN COMPARISON:  MRI 04/24/2020 FINDINGS: 4 C-arm fluoroscopic images were obtained intraoperatively and submitted for post operative interpretation. Intraoperative fluoroscopic images demonstrate placement of ACDF hardware at the C3-4 level. Additional images demonstrate surgical instrumentation in the lower thoracic spine the T10-11 level. A total of 24.5 seconds of fluoroscopy time was utilized. Please see the performing provider's procedural report for further detail. IMPRESSION: As above. Electronically Signed   By: Davina Poke D.O.   On: 04/26/2020 13:20   DG C-Arm 1-60 Min  Result Date: 04/26/2020 CLINICAL DATA:  C3-4 ACDF and T10-11 laminectomy EXAM: CERVICAL SPINE - 2-3 VIEW; THORACOLUMBAR SPINE - 2 VIEW; DG C-ARM 1-60 MIN COMPARISON:  MRI 04/24/2020 FINDINGS: 4 C-arm fluoroscopic images were obtained intraoperatively and submitted for post operative interpretation. Intraoperative fluoroscopic images demonstrate placement of ACDF hardware at the C3-4 level. Additional images demonstrate surgical instrumentation in the lower thoracic spine the T10-11 level. A total of 24.5 seconds of fluoroscopy time was utilized. Please see the performing provider's procedural report for further detail. IMPRESSION: As above. Electronically Signed   By: Davina Poke D.O.   On: 04/26/2020 13:20   Recent Labs    04/27/20 0757 04/28/20 0813  WBC 16.0* 17.5*  HGB 14.0 14.7  HCT 42.7 43.4  PLT 306 310   Recent Labs    04/26/20 0404 04/27/20 0757  NA 135 136  K 4.0 4.1  CL 103 104  CO2 24 23  GLUCOSE 417* 249*  BUN 24* 18  CREATININE 1.00 0.92  CALCIUM 8.9 8.4*     Physical Exam: BP (!) 161/70 (BP Location: Left Arm)   Pulse (!) 103   Temp 98.5 F (36.9 C) (Oral)   Resp 18   Ht 6' (1.829 m)   Wt 92.5 kg   SpO2 94%   BMI 27.66 kg/m  Constitutional: No distress . Vital signs reviewed. HENT: Normocephalic.  Atraumatic. Eyes: EOMI. No discharge. Cardiovascular: No JVD. Respiratory: Normal effort.  No stridor.  Upper airway sounds. GI: Non-distended. Skin: Right neck with edema Dressings C/D/I Psych: Some confusion. Musc: No edema in extremities.  No tenderness in extremities. Neurological: Alert and oriented, except for date Delayed processing Motor: 4+/5 throughout  Assessment/Plan: 1. 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  Physiatrist is providing close team supervision and 24 hour management of active medical problems listed below.  Physiatrist and rehab team continue to assess barriers to discharge/monitor patient progress toward functional and medical goals  Care Tool:  Bathing              Bathing assist       Upper Body Dressing/Undressing Upper body dressing        Upper body assist      Lower Body Dressing/Undressing Lower body dressing            Lower body assist       Toileting Toileting    Toileting assist Assist for toileting: 2 Helpers     Transfers Chair/bed transfer  Transfers assist           Locomotion Ambulation   Ambulation assist              Walk 10 feet activity   Assist           Walk 50 feet activity   Assist           Walk 150 feet activity   Assist           Walk 10 feet on uneven surface  activity   Assist           Wheelchair     Assist               Wheelchair 50 feet with 2 turns activity    Assist            Wheelchair 150 feet activity     Assist             Medical Problem List and Plan: 1.  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  Begin CIR evaluations 2.  Antithrombotics: -DVT/anticoagulation:  Mechanical: Sequential compression devices, below knee Bilateral lower extremities             -antiplatelet therapy: N/A 3. Pain Management: Tylenol prn. Does not like Oxycodone.  Ordered ultram prn             Monitor with increased exertion as well as cognition side effects 4. Mood: LCSW to follow for evaluation and support.              -antipsychotic agents: N/A 5. Neuropsych: This patient is not fully capable of making decisions on his own behalf. 6. Skin/Wound Care: Monitor wound for healing.  7. Fluids/Electrolytes/Nutrition: Encourage fluid intake. Monitor I/O.  8. HTN: Monitor BP -  continue amlodipine, Irbesartan, and metoprolol.   Monitor with increased mobility 9. T2DM with hyperglycemia: Hgb A1c- 7.5. Monitor BS ac/hs. Used amaryl 4 mg bid and lantus 2 units HS. Will resume lower dose amaryl and titrate to home dose as indicated--continue to taper lantus to home dose.   Monitor with increased mobility 10. Leucocytosis: Likely secondary to steroids.  Monitor for fever or other signs of infection.              WBC 17.5 on 6/5  Afebrile  Continue to monitor 11. Constipation:              Adjust bowel meds as necessary 12. Hyperbilirubinemia: CMP pending 13. Dysphagia: ST to follow for evaluation. Will down grade diet to dysphagia 2. Intake has been poor today per RN. Voice getting worse per family  Chest x-ray personally reviewed, no previous chest x-ray for comparison, suggesting ILD versus atypical infection, repeat chest x-ray ordered for Monday 14.  Urinary retention             PVRs with retention,?  Improving, continue to monitor  LOS: 1 days A FACE TO FACE EVALUATION WAS PERFORMED  Jamari Diana Lorie [place]ix 04/28/2020, 9:16  AM

## 2020-04-28 NOTE — Progress Notes (Signed)
   04/28/20 1410  Assess: MEWS Score  Temp 98.1 F (36.7 C)  BP 131/72  Pulse Rate 93  Resp (!) 27  SpO2 93 %  O2 Device Nasal Cannula  Assess: MEWS Score  MEWS Temp 0  MEWS Systolic 0  MEWS Pulse 0  MEWS RR 2  MEWS LOC 0  MEWS Score 2  MEWS Score Color Yellow  Assess: if the MEWS score is Yellow or Red  Were vital signs taken at a resting state? Yes  Focused Assessment Documented focused assessment  Early Detection of Sepsis Score *See Row Information* High  MEWS guidelines implemented *See Row Information* Yes  Take Vital Signs  Increase Vital Sign Frequency  Yellow: Q 2hr X 2 then Q 4hr X 2, if remains yellow, continue Q 4hrs  Escalate  MEWS: Escalate Yellow: discuss with charge nurse/RN and consider discussing with provider and RRT  Document  Progress note created (see row info) Yes

## 2020-04-28 NOTE — Progress Notes (Signed)
Asked to assist with NGT placement per primary RN. NGT placed in pt's right nare with no complications. No resistance met with insertion. KUB ordered to check for placement. RN to confirm placement with MD prior to using NGT. Be sure to flush NGT after medication administration to ensure tube does not become occluded.

## 2020-04-28 NOTE — Progress Notes (Signed)
Upon examination this morning patient sounded to be "wet" with his voice. Nurse completed assessment. Patient denies any pain or difficulty breathing. This nurse asked charge nurse for second opinion and respiratory to come visit. Respiratory came and documented their findings and brought a different yaunker for reaching further in the back of the throat for secretions. New set of vitals taken which led to a MEWS of 3 (fever, respirations & pulse). Dr. Posey Pronto called and notified of all findings. New orders given. Patient is now NPO. Will continue plan of care. [place]

## 2020-04-28 NOTE — Evaluation (Signed)
Occupational Therapy Assessment and Plan  Patient Details  Name: Johnathan Barker MRN: [id number] Date of Birth: 07/17/1941  OT Diagnosis: abnormal posture, acute pain, apraxia, ataxia, cognitive deficits, lumbago (low back pain), muscle weakness (generalized) and coordination disorder Rehab Potential: Rehab Potential (ACUTE ONLY): Excellent ELOS: 14-16 days   Today's Date: 04/28/2020 OT Individual Time: 1324-4010 OT Individual Time Calculation (min): 77 min     Problem List:  Patient Active Problem List   Diagnosis Date Noted  . Urinary retention   . Dysphagia   . Constipation   . Type 2 diabetes mellitus with hyperglycemia, with long-term current use of insulin ([place])   . Benign essential HTN   . Post-operative pain   . Abnormal chest x-ray   . FUO (fever of unknown origin)   . 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 Controlled type 2 diabetes mellitus with hyperglycemia, with long-term current use of insulin ([place])   . Drug induced constipation   . Leucocytosis   . Hyperbilirubinemia   . Myelopathy ([place]) 04/26/2020  . Weakness of right lower extremity 04/24/2020  . Essential hypertension 04/24/2020  . Diabetes mellitus type 2 in nonobese ([place]) 04/24/2020  . Cord compression ([place]) 04/24/2020  . 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 Arthritis    knees  . Diabetes mellitus without complication ([place])    type II  . Dysrhythmia   . History of kidney stones    lithotripsy  . Hypertension   . Weakness of both legs 04/24/2020   Past Surgical History:  Past Surgical History:  Procedure Laterality Date  . ANTERIOR CERVICAL DECOMP/DISCECTOMY FUSION N/A 04/26/2020   Procedure: Cervical Three-Four anterior cervical decompression/discectomy/fusion with plate;  Surgeon: Eustace Moore, MD;  Location: [facility];  Service: Neurosurgery;  Laterality: N/A;  anterior, Part-1  . EYE SURGERY Bilateral    cataracts  removed= IOL  . LUMBAR LAMINECTOMY/DECOMPRESSION MICRODISCECTOMY N/A 04/26/2020   Procedure: Thoracic Ten-Eleven Laminectomy;  Surgeon: Eustace Moore, MD;  Location: [facility];  Service: Neurosurgery;  Laterality: N/A;  Posterior, Part-2  . TONSILLECTOMY    . TOTAL KNEE ARTHROPLASTY Right 11/30/2017   Procedure: RIGHT TOTAL KNEE ARTHROPLASTY;  Surgeon: Vickey Huger, MD;  Location: [facility];  Service: Orthopedics;  Laterality: Right;  . TOTAL KNEE ARTHROPLASTY Left 09/13/2018   Procedure: LEFT TOTAL KNEE ARTHROPLASTY;  Surgeon: Vickey Huger, MD;  Location: [facility];  Service: Orthopedics;  Laterality: Left;  with block    Assessment & Plan Clinical Impression: Johnathan Barker is a 79 year old male with history of HTN, T2DM, BLE weakness with difficulty walking over past couple of months with frequent falls.  History taken from chart review, son, and wife due to mentation.  He was admitted on 04/24/2020 with lower extremity weakness and feeling cold.  MRI brain, personally reviewed, unremarkable for acute intracranial process. 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  Also found to have 3.5 cm right thyroid nodule with recommendations for thyroid ultrasound. Dr. 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 He was started on IV decadron and on 04/26/2020, patient underwent ACDF of C3/C4 and decompressive thoracic laminectomy/medial facetectomy.  Post  op course complicated by confusion and disorientation per nurse and family (thought his wife was his mother and his son was his brother--has not been acting like himself per family).  Patient with associated bilateral lower extremity spasms.  Please see  preadmission assessment from earlier today as well.   Patient currently requires max with basic self-care skills secondary to muscle weakness and muscle paralysis, decreased cardiorespiratoy endurance, abnormal tone, unbalanced muscle activation, ataxia, decreased coordination and decreased motor planning, decreased motor planning, decreased attention, decreased awareness, decreased problem solving, decreased safety awareness and decreased memory and decreased sitting balance, decreased standing balance and decreased postural control.  Prior to hospitalization, patient could complete BADLs with min.  Patient will benefit from skilled intervention to increase independence with basic self-care skills prior to discharge home with wife.  Anticipate patient will require 24 hour supervision and follow up home health.  OT - End of Session Endurance Deficit: Yes(pt requesting to return to bed at end of session due to fatigue) Endurance Deficit Description: frequent rest breaks during functional activity OT Assessment Rehab Potential (ACUTE ONLY): Excellent OT Barriers to Discharge: Medical stability;Nutrition means OT Patient demonstrates impairments in the following area(s): Balance;Safety;Cognition;Endurance;Motor;Nutrition;Pain OT Basic ADL's Functional Problem(s): Grooming;Bathing;Dressing;Toileting;Eating OT Advanced ADL's Functional Problem(s): Simple Meal Preparation OT Transfers Functional Problem(s): Toilet;Tub/Shower OT Additional Impairment(s): None OT Plan OT Intensity: Minimum of 1-2 x/day, 45 to 90 minutes OT Frequency: 5 out of 7 days OT Duration/Estimated Length of Stay: 14-16 days OT Treatment/Interventions: Balance/vestibular training;Discharge planning;Functional electrical stimulation;Pain management;Self Care/advanced ADL retraining;Therapeutic Activities;UE/LE Coordination activities;Therapeutic Exercise;Patient/family education;Functional mobility training;Disease  mangement/prevention;Cognitive remediation/compensation;Community reintegration;DME/adaptive equipment instruction;Neuromuscular re-education;Psychosocial support;UE/LE Strength taining/ROM;Wheelchair propulsion/positioning OT Self Feeding Anticipated Outcome(s): No goal- pt NPO OT Basic Self-Care Anticipated Outcome(s): Supervision/cuing OT Toileting Anticipated Outcome(s): Supervision/cuing OT Bathroom Transfers Anticipated Outcome(s): Supervision/cuing OT Recommendation Patient destination: Home Follow Up Recommendations: Home health OT;24 hour supervision/assistance Equipment Recommended: To be determined  Skilled Therapeutic Intervention Skilled OT session completed with focus on initial evaluation, education on OT role/POC, and establishment of patient-centered goals.   Pt greeted in bed with no c/o pain. Family at bedside. Supine<sit from flat bed via logroll technique completed with Max A. While EOB, pt able to maintain sitting balance with supervision-Min A. Vcs for sequencing and increased assistance for thoroughness during bathing tasks. Tried to have pt attempt seated figure 4 but this was too difficult due to cognition. Pt did initiate pulling 1 limb into figure 4 position when he was attempting to thread 1 leg into his pants. Max A for LB self care overall due to back precautions and decreased adherence/awareness from pt. Mod-Max A sit<stand using RW for perihygiene and dressing tasks. Note that pt has Rt LE ataxia and foot would kick out before standing. He needed facilitation to keep his leg on the floor. Mod A for dynamic standing balance with pt tending to exhibit anterior lean towards the Rt. He completed Stedy transfer to [facility] for bowel movement. Pt having continent bowel void! Min A sit<stand in [place] with vcs and assistance for LE placement at times. He was also a bit impulsive with sit<stands in [place]. Pt required Total A for hygiene and clothing mgt. He returned to bed via [place],  [place] A for logroll to supine. Left pt comfortably in bed with all needs within reach, bed alarm set, and family still present.     OT Evaluation Precautions/Restrictions  Precautions Precautions: Cervical;Back;Fall Precaution Comments: reviewed precautions Restrictions Weight Bearing Restrictions: No Vital Signs Therapy Vitals Temp: 98.1  F (36.7 C) Pulse Rate: 93 Resp: (!) 27 BP: 131/72 Patient Position (if appropriate): Lying Oxygen Therapy SpO2: 93 % O2 Device: Nasal Cannula Pain: 5/10 in back, notified RN at end of session that he wanted some pain medicine to address Pain Assessment Pain Scale: 0-10 Pain Score: 0-No pain Home Living/Prior Functioning Home Living Family/patient expects to be discharged to:: Inpatient rehab Living Arrangements: Spouse/significant other Available Help at Discharge: Family, Available 24 hours/day Type of Home: House Home Access: Stairs to enter Technical brewer of Steps: 3 Entrance Stairs-Rails: Left Home Layout: One level Bathroom Shower/Tub: Multimedia programmer: Standard Bathroom Accessibility: Yes Additional Comments: obtained via chart, pt unable to recall home setup and assist available  Lives With: Spouse([name]) IADL History Occupation: Retired Type of Occupation: Art gallery manager Leisure and Hobbies: Spending time with his grandchildren, going to little league games Prior Function Level of Independence: Needs assistance with ADLs, Requires assistive device for independence(pt spouse Butch Penny, pt has needed increased assistance with mobility and ADLs for the past 3 months)  Able to Take Stairs?: Yes Comments: 4 falls in past several months, used RW for past few months ADL ADL Eating: NPO Grooming: Supervision/safety Where Assessed-Grooming: Edge of bed Upper Body Bathing: Minimal assistance Where Assessed-Upper Body Bathing: Edge of bed Lower Body Bathing: Moderate assistance Where Assessed-Lower Body Bathing:  Edge of bed Upper Body Dressing: Minimal assistance Where Assessed-Upper Body Dressing: Edge of bed Lower Body Dressing: Maximal assistance Where Assessed-Lower Body Dressing: Edge of bed Toileting: Dependent(using Stedy) Where Assessed-Toileting: Bedside Commode Toilet Transfer: Dependent Toilet Transfer Method: Other (comment)(Stedy) Science writer: Radiographer, therapeutic: Not assessed Vision Baseline Vision/History: Wears glasses Wears Glasses: Reading only Patient Visual Report: Blurring of vision Perception  Perception: Within Functional Limits Praxis Praxis: Impaired Praxis Impairment Details: Motor planning Cognition Overall Cognitive Status: Impaired/Different from baseline Arousal/Alertness: Awake/alert Orientation Level: Person;Place;Situation Person: Oriented Place: Oriented Situation: Disoriented(pt reporting he was at [facility] after knee surgery) Year: 2021 Month: June Day of Week: Incorrect Memory: Impaired Memory Impairment: Decreased recall of new information;Decreased short term memory Decreased Short Term Memory: Verbal basic;Functional basic Immediate Memory Recall: Sock;Blue;Bed Memory Recall Sock: Not able to recall Memory Recall Blue: With Cue Memory Recall Bed: With Cue Attention: Sustained Focused Attention: Appears intact Sustained Attention: Impaired Sustained Attention Impairment: Verbal basic;Functional basic Awareness: Impaired Awareness Impairment: Emergent impairment Problem Solving: Impaired(unable to figure out how to orient pants correctly after donning them backwards) Problem Solving Impairment: Verbal basic;Functional basic Executive Function: Decision Making;Organizing;Reasoning Reasoning: Impaired Reasoning Impairment: Verbal basic;Functional basic Organizing: Impaired Organizing Impairment: Verbal basic;Functional basic Decision Making: Impaired Decision Making Impairment: Verbal basic;Functional  basic Behaviors: Impulsive Safety/Judgment: Impaired(pt often initiated standing in St[place] when cued to sit for safety) Sensation Sensation Light Touch: Impaired by gross assessment(impaired in BLE vs cognitive deficits) Proprioception: Impaired Detail Proprioception Impaired Details: Impaired RLE;Impaired LLE Coordination Gross Motor Movements are Fluid and Coordinated: No Fine Motor Movements are Fluid and Coordinated: No Coordination and Movement Description: 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; RLE ataxia Motor  Motor Motor: Tetraplegia;Abnormal tone;Ataxia;Abnormal postural alignment and control Motor - Skilled Clinical Observations: 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; RLE ataxia Mobility  Bed Mobility Bed Mobility: Rolling Right;Rolling Left;Supine to Sit;Sit to Supine Rolling Right: Moderate Assistance - Patient 50-74% Rolling Left: Moderate Assistance - Patient 50-74% Supine to Sit: Moderate Assistance - Patient 50-74% Sit to Supine: Moderate Assistance - Patient 50-74% Transfers Sit to Stand: Maximal Assistance - Patient 25-49%  Trunk/Postural Assessment  Cervical Assessment Cervical Assessment: Exceptions  to WFL(forward head, cervical precautions) Thoracic Assessment Thoracic Assessment: Exceptions to WFL(rounded shoulders) Lumbar Assessment Lumbar Assessment: Exceptions to WFL(posterior pelvic tilt) Postural Control Postural Control: Deficits on evaluation(anterior Rt lean in standing)  Balance Balance Balance Assessed: Yes Static Sitting Balance Static Sitting - Balance Support: Bilateral upper extremity supported;Feet supported Static Sitting - Level of Assistance: 5: Stand by assistance Dynamic Sitting Balance Dynamic Sitting - Balance Support: No upper extremity supported;Feet supported;During functional activity Dynamic Sitting - Level of Assistance: 4: Min assist(attempting to wash his foot once OT placed leg in figure 4 position) Static Standing  Balance Static Standing - Balance Support: Bilateral upper extremity supported;During functional activity Static Standing - Level of Assistance: 3: Mod assist Dynamic Standing Balance Dynamic Standing - Balance Support: During functional activity;Left upper extremity supported Dynamic Standing - Level of Assistance: 3: Mod assist(perihygiene completion while standing with RW) Extremity/Trunk Assessment RUE Assessment RUE Assessment: Within Functional Limits LUE Assessment LUE Assessment: Within Functional Limits   Refer to Care Plan for Long Term Goals  Recommendations for other services: None    Discharge Criteria: Patient will be discharged from OT if patient refuses treatment 3 consecutive times without medical reason, if treatment goals not met, if there is a change in medical status, if patient makes no progress towards goals or if patient is discharged from hospital.  The above assessment, treatment plan, treatment alternatives and goals were discussed and mutually agreed upon: by patient  Skeet Simmer 04/28/2020, 3:58 PM

## 2020-04-28 NOTE — Progress Notes (Signed)
Pharmacy Antibiotic Note  Johnathan Barker is a 79 y.o. male admitted on 04/27/2020 with possible aspiration PNA.  Pharmacy has been consulted for Cefepime dosing. WBC up to 17.5 (on steroids). Afebrile. Renal function stable. CXR ordered for Monday.   Plan:  Start Cefepime 2 g IV Q8H Flagyl per MD F/u cultures, renal function and clinical improvement De-escalate as needed   Height: 6' (182.9 cm) Weight: 92.5 kg (203 lb 14.8 oz) IBW/kg (Calculated) : 77.6  Temp (24hrs), Avg:98.7 F (37.1 C), Min:98.2 F (36.8 C), Max:101 F (38.3 C)  Recent Labs  Lab 04/24/20 0548 04/25/20 0546 04/26/20 0404 04/27/20 0757 04/28/20 0813  WBC 13.4* 12.4* 16.7* 16.0* 17.5*  CREATININE 0.81 1.04 1.00 0.92 0.96    Estimated Creatinine Clearance: 70.7 mL/min (by C-G formula based on SCr of 0.96 mg/dL).    Allergies  Allergen Reactions  . Oxycodone     Ineffective/disorientation  . Penicillins Hives, Rash and Other (See Comments)    FEVER  PATIENT HAS HAD A PCN REACTION WITH IMMEDIATE RASH, FACIAL/TONGUE/THROAT SWELLING, SOB, OR LIGHTHEADEDNESS WITH HYPOTENSION:  #  #  #  YES  #  #  #   Has patient had a PCN reaction causing severe rash involving mucus membranes or skin necrosis: No Has patient had a PCN reaction that required hospitalization: No Has patient had a PCN reaction occurring within the last 10 years: No If all of the above answers are "NO", then may proceed with Cephalosporin use.     Antimicrobials this admission: Cefepime 6/5>> Flagyl 6/5>>  Dose adjustments this admission: None  Microbiology results: 6/5 BCx: pending    Thank you for allowing pharmacy to be a part of this patient's care.  Lorel Monaco, PharmD PGY1 Ambulatory Care Resident [facility] # [phone]

## 2020-04-28 NOTE — Progress Notes (Signed)
   04/28/20 1410  Assess: MEWS Score  Temp 98.1 F (36.7 C)  BP 131/72  Pulse Rate 93  Resp (!) 27  SpO2 93 %  O2 Device Nasal Cannula  Assess: MEWS Score  MEWS Temp 0  MEWS Systolic 0  MEWS Pulse 0  MEWS RR 2  MEWS LOC 0  MEWS Score 2  MEWS Score Color Yellow

## 2020-04-28 NOTE — Plan of Care (Signed)
  Problem: RH Balance Goal: LTG Patient will maintain dynamic standing with ADLs (OT) Description: LTG:  Patient will maintain dynamic standing balance with assist during activities of daily living (OT)  Flowsheets (Taken 04/28/2020 1627) LTG: Pt will maintain dynamic standing balance during ADLs with: Supervision/Verbal cueing   Problem: Sit to Stand Goal: LTG:  Patient will perform sit to stand in prep for activites of daily living with assistance level (OT) Description: LTG:  Patient will perform sit to stand in prep for activites of daily living with assistance level (OT) Flowsheets (Taken 04/28/2020 1627) LTG: PT will perform sit to stand in prep for activites of daily living with assistance level: Supervision/Verbal cueing   Problem: RH Bathing Goal: LTG Patient will bathe all body parts with assist levels (OT) Description: LTG: Patient will bathe all body parts with assist levels (OT) Flowsheets (Taken 04/28/2020 1627) LTG: Pt will perform bathing with assistance level/cueing: Supervision/Verbal cueing   Problem: RH Dressing Goal: LTG Patient will perform upper body dressing (OT) Description: LTG Patient will perform upper body dressing with assist, with/without cues (OT). Flowsheets (Taken 04/28/2020 1627) LTG: Pt will perform upper body dressing with assistance level of: Supervision/Verbal cueing Goal: LTG Patient will perform lower body dressing w/assist (OT) Description: LTG: Patient will perform lower body dressing with assist, with/without cues in positioning using equipment (OT) Flowsheets (Taken 04/28/2020 1627) LTG: Pt will perform lower body dressing with assistance level of: Supervision/Verbal cueing   Problem: RH Toileting Goal: LTG Patient will perform toileting task (3/3 steps) with assistance level (OT) Description: LTG: Patient will perform toileting task (3/3 steps) with assistance level (OT)  Flowsheets (Taken 04/28/2020 1627) LTG: Pt will perform toileting task (3/3  steps) with assistance level: Supervision/Verbal cueing   Problem: RH Toilet Transfers Goal: LTG Patient will perform toilet transfers w/assist (OT) Description: LTG: Patient will perform toilet transfers with assist, with/without cues using equipment (OT) Flowsheets (Taken 04/28/2020 1627) LTG: Pt will perform toilet transfers with assistance level of: Supervision/Verbal cueing   Problem: RH Tub/Shower Transfers Goal: LTG Patient will perform tub/shower transfers w/assist (OT) Description: LTG: Patient will perform tub/shower transfers with assist, with/without cues using equipment (OT) Flowsheets (Taken 04/28/2020 1627) LTG: Pt will perform tub/shower stall transfers with assistance level of: Supervision/Verbal cueing

## 2020-04-28 NOTE — Progress Notes (Signed)
Radiology called regarding need for positioning of NG tube. NG advanced to a measured external length of 40cm. Awaiting follow up abd xray before use.

## 2020-04-28 NOTE — Evaluation (Signed)
Physical Therapy Assessment and Plan  Patient Details  Name: Johnathan Barker MRN: [id number] Date of Birth: 11-10-1941  PT Diagnosis: Abnormal posture, Abnormality of gait, Ataxia, Ataxic gait, Cognitive deficits, Coordination disorder, Difficulty walking, Impaired cognition, Impaired sensation, Muscle weakness and Quadriplegia Rehab Potential: Good ELOS: 14-16 days   Today's Date: 04/28/2020 PT Individual Time: 1740-8144 PT Individual Time Calculation (min): 55 min    Problem List:  Patient Active Problem List   Diagnosis Date Noted  . Urinary retention   . Dysphagia   . Constipation   . Type 2 diabetes mellitus with hyperglycemia, with long-term current use of insulin ([place])   . Benign essential HTN   . Post-operative pain   . Abnormal chest x-ray   . FUO (fever of unknown origin)   . 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 Controlled type 2 diabetes mellitus with hyperglycemia, with long-term current use of insulin ([place])   . Drug induced constipation   . Leucocytosis   . Hyperbilirubinemia   . Myelopathy ([place]) 04/26/2020  . Weakness of right lower extremity 04/24/2020  . Essential hypertension 04/24/2020  . Diabetes mellitus type 2 in nonobese ([place]) 04/24/2020  . Cord compression ([place]) 04/24/2020  . 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 Arthritis    knees  . Diabetes mellitus without complication ([place])    type II  . Dysrhythmia   . History of kidney stones    lithotripsy  . Hypertension   . Weakness of both legs 04/24/2020   Past Surgical History:  Past Surgical History:  Procedure Laterality Date  . ANTERIOR CERVICAL DECOMP/DISCECTOMY FUSION N/A 04/26/2020   Procedure: Cervical Three-Four anterior cervical decompression/discectomy/fusion with plate;  Surgeon: Eustace Moore, MD;  Location: [facility];  Service: Neurosurgery;  Laterality: N/A;  anterior, Part-1  . EYE SURGERY Bilateral     cataracts removed= IOL  . LUMBAR LAMINECTOMY/DECOMPRESSION MICRODISCECTOMY N/A 04/26/2020   Procedure: Thoracic Ten-Eleven Laminectomy;  Surgeon: Eustace Moore, MD;  Location: [facility];  Service: Neurosurgery;  Laterality: N/A;  Posterior, Part-2  . TONSILLECTOMY    . TOTAL KNEE ARTHROPLASTY Right 11/30/2017   Procedure: RIGHT TOTAL KNEE ARTHROPLASTY;  Surgeon: Vickey Huger, MD;  Location: [facility];  Service: Orthopedics;  Laterality: Right;  . TOTAL KNEE ARTHROPLASTY Left 09/13/2018   Procedure: LEFT TOTAL KNEE ARTHROPLASTY;  Surgeon: Vickey Huger, MD;  Location: [facility];  Service: Orthopedics;  Laterality: Left;  with block    Assessment & Plan Clinical Impression:  Johnathan Denardo. Barker is a 79 year old male with history of HTN, T2DM, BLE weakness with difficulty walking over past couple of months with frequent falls.  History taken from chart review, son, and wife due to mentation.  He was admitted on 04/24/2020 with lower extremity weakness and feeling cold.  MRI brain, personally reviewed, unremarkable for acute intracranial process. 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  Also found to have 3.5 cm right thyroid nodule with recommendations for thyroid ultrasound. Dr. 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 He was started on IV decadron and on 04/26/2020, patient underwent ACDF of C3/C4 and decompressive thoracic laminectomy/medial facetectomy.  Post  op course complicated by confusion and disorientation per nurse and family (thought his wife was his mother and his son was his brother--has not been acting like himself per family).  Patient with associated bilateral lower extremity spasms.   Please see preadmission assessment from earlier today as well. Patient transferred to CIR on 04/27/2020 .   Patient currently requires max with mobility secondary to muscle weakness, decreased cardiorespiratoy endurance, impaired timing and sequencing, unbalanced muscle activation, ataxia, decreased coordination and decreased motor planning, decreased attention, decreased awareness, decreased problem solving, decreased safety awareness, decreased memory and delayed processing and decreased sitting balance, decreased standing balance, decreased postural control and decreased balance strategies.  Prior to hospitalization, patient was modified independent  with mobility and lived with Spouse in a House home.  Home access is 3Stairs to enter.  Patient will benefit from skilled PT intervention to maximize safe functional mobility, minimize fall risk and decrease caregiver burden for planned discharge home with 24 hour assist.  Anticipate patient will benefit from follow up [facility] at discharge.  PT - End of Session Activity Tolerance: Tolerates 30+ min activity with multiple rests Endurance Deficit: Yes Endurance Deficit Description: frequent rest breaks during functional activity PT Assessment Rehab Potential (ACUTE/IP ONLY): Good PT Barriers to Discharge: Medical stability;Incontinence PT Patient demonstrates impairments in the following area(s): Balance;Endurance;Motor;Perception;Safety;Sensory PT Transfers Functional Problem(s): Bed Mobility;Bed to Chair;Car;Furniture;Floor PT Locomotion Functional Problem(s): Ambulation;Wheelchair Mobility;Stairs PT Plan PT Intensity: Minimum of 1-2 x/day ,45 to 90 minutes PT Frequency: 5 out of 7 days PT Duration Estimated Length of Stay: 14-16 days PT Treatment/Interventions: Ambulation/gait training;Balance/vestibular training;Cognitive remediation/compensation;Community reintegration;Discharge planning;Disease management/prevention;DME/adaptive equipment  instruction;Functional mobility training;Neuromuscular re-education;Pain management;Patient/family education;Psychosocial support;Splinting/orthotics;Stair training;Therapeutic Activities;Therapeutic Exercise;UE/LE Strength taining/ROM;UE/LE Coordination activities;Wheelchair propulsion/positioning PT Transfers Anticipated Outcome(s): Supervision PT Locomotion Anticipated Outcome(s): Supervision with LRAD PT Recommendation Follow Up Recommendations: Home health PT;24 hour supervision/assistance Patient destination: Home Equipment Recommended: Rolling walker with 5" wheels Equipment Details: TBD pending progress  Skilled Therapeutic Intervention Evaluation completed (see details above and below) with education on PT POC and goals and individual treatment initiated with focus on functional transfer assessment, short distance gait assessment, and setting pt up with appropriate equipment to be used during rehab stay. Pt received semi-reclined in bed, agreeable to PT evaluation. No complaints of pain. Pt found to be incontinent of urine. Rolling L/R with mod A for dependent pericare.  Supine to sit with mod A for some LE management and trunk elevated. Sit to stand with mod A to RW. Stand pivot transfer to w/c with mod A. Sit to stand with max A to RW from w/c seat height. Ambulation x 5 ft with RW and mod A to turn and sit in recliner. Pt left seated in recliner in room with needs in reach, quick release belt and chair alarm in place at end of session.  PT Evaluation Precautions/Restrictions Precautions Precautions: Fall;Back;Other (comment)(cervical) Precaution Comments: reviewed precautions Restrictions Weight Bearing Restrictions: No Home Living/Prior Functioning Home Living Available Help at Discharge: Family;Available 24 hours/day Type of Home: House Home Access: Stairs to enter [employer] of Steps: 3 Entrance Stairs-Rails: Left Home Layout: One level Additional Comments:  obtained via chart, pt unable to recall home setup and assist available  Lives With: Spouse Prior Function Level of Independence: Independent with gait;Independent with transfers;Requires assistive device for independence  Able to Take Stairs?: Yes Comments: 4 falls in past several months, used RW for past few months Vision/Perception  Perception Perception: Within Functional Limits Praxis Praxis: Impaired Praxis Impairment Details:  Motor planning  Cognition Overall Cognitive Status: Impaired/Different from baseline Arousal/Alertness: Lethargic Orientation Level: Oriented to person;Oriented to place;Disoriented to time;Oriented to situation Attention: Focused;Sustained Focused Attention: Appears intact Sustained Attention: Impaired Memory: Impaired Memory Impairment: Decreased recall of new information;Decreased short term memory Awareness: Impaired Awareness Impairment: Anticipatory impairment Problem Solving: Impaired Behaviors: Impulsive Safety/Judgment: Impaired Sensation Sensation Light Touch: Impaired by gross assessment(impaired in BLE vs cognitive deficits) Proprioception: Impaired Detail Proprioception Impaired Details: Impaired RLE;Impaired LLE Coordination Gross Motor Movements are Fluid and Coordinated: No Fine Motor Movements are Fluid and Coordinated: No Coordination and Movement Description: 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; RLE ataxia Motor  Motor Motor: Tetraplegia;Abnormal tone;Ataxia;Abnormal postural alignment and control Motor - Skilled Clinical Observations: 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; RLE ataxia  Mobility Bed Mobility Bed Mobility: Rolling Right;Rolling Left;Supine to Sit;Sit to Supine Rolling Right: Moderate Assistance - Patient 50-74% Rolling Left: Moderate Assistance - Patient 50-74% Supine to Sit: Moderate Assistance - Patient 50-74% Sit to Supine: Moderate Assistance - Patient 50-74% Transfers Transfers: Sit to Stand;Stand Pivot  Transfers Sit to Stand: Maximal Assistance - Patient 25-49% Stand Pivot Transfers: Moderate Assistance - Patient 50 - 74% Stand Pivot Transfer Details: Tactile cues for initiation;Tactile cues for sequencing;Tactile cues for posture;Tactile cues for placement;Verbal cues for sequencing;Verbal cues for technique;Verbal cues for precautions/safety;Verbal cues for gait pattern;Verbal cues for safe use of DME/AE Transfer (Assistive device): Rolling walker Locomotion  Gait Assistive device: Rolling walker Gait Gait Pattern: Impaired(decreased B step length and LE clearance, ataxic RLE) Gait velocity: decreased Stairs / Additional Locomotion Stairs: No Wheelchair Mobility Wheelchair Mobility: No  Trunk/Postural Assessment  Cervical Assessment Cervical Assessment: Exceptions to WFL(forward head; cervical precautions) Thoracic Assessment Thoracic Assessment: Exceptions to WFL(rounded shoulders; back precautions) Lumbar Assessment Lumbar Assessment: Exceptions to WFL(posterior pelvic tilt) Postural Control Postural Control: Deficits on evaluation  Balance Balance Balance Assessed: Yes Static Sitting Balance Static Sitting - Balance Support: Bilateral upper extremity supported;Feet supported Static Sitting - Level of Assistance: 5: Stand by assistance Dynamic Sitting Balance Dynamic Sitting - Balance Support: No upper extremity supported;Feet supported;During functional activity Dynamic Sitting - Level of Assistance: 4: Min Insurance risk surveyor Standing - Balance Support: Bilateral upper extremity supported;During functional activity Static Standing - Level of Assistance: 3: Mod assist Dynamic Standing Balance Dynamic Standing - Balance Support: Bilateral upper extremity supported;During functional activity Dynamic Standing - Level of Assistance: 3: Mod assist Extremity Assessment   RLE Assessment RLE Assessment: Exceptions to [facility] General Strength Comments: impaired,  at least 3/5, difficulty with formal assessment 2/2 cognitive deficits LLE Assessment LLE Assessment: Exceptions to [facility] General Strength Comments: impaired, at least 3/5, difficulty with formal assessment 2/2 cognitive deficits     Refer to Care Plan for Long Term Goals  Recommendations for other services: None   Discharge Criteria: Patient will be discharged from PT if patient refuses treatment 3 consecutive times without medical reason, if treatment goals not met, if there is a change in medical status, if patient makes no progress towards goals or if patient is discharged from hospital.  The above assessment, treatment plan, treatment alternatives and goals were discussed and mutually agreed upon: by patient   Excell Seltzer, PT, DPT 04/28/2020, 2:59 PM

## 2020-04-28 NOTE — Progress Notes (Signed)
Notified by staff of malplacement of NGT. Attempted repositioning of 30fr tube. Unsuccessful. 16 fr NGT placed. Awaiting confirmation with abd xray.

## 2020-04-28 NOTE — Evaluation (Signed)
Speech Language Pathology Assessment and Plan  Patient Details  Name: Johnathan Barker MRN: [id number] Date of Birth: 1941-07-09  SLP Diagnosis: Speech and Language deficits;Cognitive Impairments;Dysphagia  Rehab Potential: Good ELOS: 14-16 days    Today's Date: 04/28/2020 SLP Individual Time: 4401-0272 SLP Individual Time Calculation (min): 47 min   Problem List:  Patient Active Problem List   Diagnosis Date Noted  . Urinary retention   . Dysphagia   . Constipation   . Type 2 diabetes mellitus with hyperglycemia, with long-term current use of insulin ([facility])   . Benign essential HTN   . Post-operative pain   . Abnormal chest x-ray   . FUO (fever of unknown origin)   . Contusion of cervical cord ([facility]) 04/27/2020  . Controlled type 2 diabetes mellitus with hyperglycemia, with long-term current use of insulin ([place])   . Drug induced constipation   . Leucocytosis   . Hyperbilirubinemia   . Myelopathy ([place]) 04/26/2020  . Weakness of right lower extremity 04/24/2020  . Essential hypertension 04/24/2020  . Diabetes mellitus type 2 in nonobese ([facility]) 04/24/2020  . Cord compression ([place]) 04/24/2020  . 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 Arthritis    knees  . Diabetes mellitus without complication ([facility])    type II  . Dysrhythmia   . History of kidney stones    lithotripsy  . Hypertension   . Weakness of both legs 04/24/2020   Past Surgical History:  Past Surgical History:  Procedure Laterality Date  . ANTERIOR CERVICAL DECOMP/DISCECTOMY FUSION N/A 04/26/2020   Procedure: Cervical Three-Four anterior cervical decompression/discectomy/fusion with plate;  Surgeon: Eustace Moore, MD;  Location: [facility];  Service: Neurosurgery;  Laterality: N/A;  anterior, Part-1  . EYE SURGERY Bilateral    cataracts removed= IOL  . LUMBAR LAMINECTOMY/DECOMPRESSION MICRODISCECTOMY N/A 04/26/2020   Procedure: Thoracic Ten-Eleven  Laminectomy;  Surgeon: Eustace Moore, MD;  Location: [facility];  Service: Neurosurgery;  Laterality: N/A;  Posterior, Part-2  . TONSILLECTOMY    . TOTAL KNEE ARTHROPLASTY Right 11/30/2017   Procedure: RIGHT TOTAL KNEE ARTHROPLASTY;  Surgeon: Vickey Huger, MD;  Location: [facility];  Service: Orthopedics;  Laterality: Right;  . TOTAL KNEE ARTHROPLASTY Left 09/13/2018   Procedure: LEFT TOTAL KNEE ARTHROPLASTY;  Surgeon: Vickey Huger, MD;  Location: [facility];  Service: Orthopedics;  Laterality: Left;  with block    Assessment / Plan / Recommendation Clinical Impression   HPI:  Johnathan Barker. [name] is a 79 year old male with history of HTN, T2DM, BLE weakness with difficulty walking over past couple of months with frequent falls.  History taken from chart review, son, and wife due to mentation.  He was admitted on 04/24/2020 with lower extremity weakness and feeling cold.  MRI brain, personally reviewed, unremarkable for acute intracranial process. 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  Also found to have 3.5 cm right thyroid nodule with recommendations for thyroid ultrasound. Dr. 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 He was started on IV decadron and on 04/26/2020, patient underwent ACDF of C3/C4 and decompressive thoracic laminectomy/medial facetectomy.  Post op course complicated by confusion and disorientation per nurse and family (  thought his wife was his mother and his son was his brother--has not been acting like himself per family).  Patient with associated bilateral lower extremity spasms.  Pt admitted to CIR 04/27/20 and ST evaluations were completed 04/28/20 with results as follows:  Clinical  bedside swallow evaluation: Pt presents with s/sx concerning for pharyngeal dysphagia. Given results of recent CXR and current bedside presentation, would recommend pt continue NPO until instrumental swallow assessment can be administered to fully assess oropharyngeal swallow function. Medical team may wish to pursue alternative means for nutrition and medication administration, temporarily. Pt's baseline vocal quality sounded wet throughout evaluation, and he frequently expectorated yellow/green/brown secretions via suction. After thorough oral care, conservative trials of ice chips were administered, with immediate cough response noted in 100% of trials. Pt also with cough and expectoration of puree trial X1. SLP contacted pt's MD regarding results, who requested to wait for medical clearance prior to scheduling MBSS.   Cognitive-linguistic evaluation: Pt presented with fluctuating levels of confusion throughout evaluation today. He was oriented to self and place, but disoriented to time and situation. His family at bedside reports his level of confusion is greater today than any other time during admission. Pt demonstrated inconsistent ability to follow 1 and 2 step commands. He also demonstrated decreased ability to perform mental calculations, recall new information, and maintain topics of conversation. Pt's expressive and receptive language skills appeared in tact, however intelligibility was reduced due to extremely wet vocal quality, requiring cues for pt to clear his throat and use increased vocal intensity.   Recommend pt receive skilled ST services while inpatient to address dysphagia, cognitive impairments, and functional communication in order to maximize his functional independence and safety prior to discharge.    Skilled Therapeutic Interventions          Bedside swallow and cognitive-linguistic evaluations were completed and results were reviewed with pt (please see above for details regarding  results).    SLP Assessment  Patient will need skilled Speech Lanaguage Pathology Services during CIR admission    Recommendations  SLP Diet Recommendations: Alternative means - temporary;NPO Medication Administration: Via alternative means Oral Care Recommendations: Oral care QID Patient destination: Home Follow up Recommendations: Home Health SLP;24 hour supervision/assistance Equipment Recommended: None recommended by SLP    SLP Frequency 3 to 5 out of 7 days   SLP Duration  SLP Intensity  SLP Treatment/Interventions 14-16 days  Minumum of 1-2 x/day, 30 to 90 minutes  Cognitive remediation/compensation;Cueing hierarchy;Dysphagia/aspiration precaution training;Functional tasks;Patient/family education;Internal/external aids;Speech/Language facilitation    Pain Pain Assessment Pain Scale: 0-10 Pain Score: 0-No pain  Prior Functioning Type of Home: House  Lives With: Spouse Available Help at Discharge: Family;Available 24 hours/day  SLP Evaluation Cognition Overall Cognitive Status: Impaired/Different from baseline Arousal/Alertness: Awake/alert Orientation Level: Oriented to person;Oriented to place;Disoriented to time;Oriented to situation Attention: Sustained Focused Attention: Appears intact Sustained Attention: Impaired Sustained Attention Impairment: Verbal basic;Functional basic Memory: Impaired Memory Impairment: Decreased recall of new information;Decreased short term memory Decreased Short Term Memory: Verbal basic;Functional basic Awareness: Impaired Awareness Impairment: Emergent impairment Problem Solving: Impaired Problem Solving Impairment: Verbal basic;Functional basic Executive Function: Decision Making;Organizing;Reasoning Reasoning: Impaired Reasoning Impairment: Verbal basic;Functional basic Organizing: Impaired Organizing Impairment: Verbal basic;Functional basic Decision Making: Impaired Decision Making Impairment: Verbal basic;Functional  basic Behaviors: Impulsive Safety/Judgment: Impaired  Comprehension Auditory Comprehension Overall Auditory Comprehension: Impaired Yes/No Questions: Within Functional Limits Commands: Impaired One Step Basic Commands: 50-74% accurate Two Step Basic Commands: 50-74% accurate Conversation: Simple Interfering Components: Attention EffectiveTechniques:  Repetition;Stressing words Visual Recognition/Discrimination Discrimination: Not tested Reading Comprehension Reading Status: Not tested Expression Expression Primary Mode of Expression: Verbal Verbal Expression Overall Verbal Expression: Appears within functional limits for tasks assessed Written Expression Written Expression: Not tested Oral Motor Oral Motor/Sensory Function Overall Oral Motor/Sensory Function: Within functional limits Motor Speech Overall Motor Speech: Impaired Respiration: Within functional limits Phonation: Wet Resonance: Within functional limits Articulation: Within functional limitis Intelligibility: Intelligibility reduced Word: 75-100% accurate Phrase: 75-100% accurate Sentence: 75-100% accurate Conversation: 75-100% accurate Motor Planning: Witnin functional limits Motor Speech Errors: Not applicable   Intelligibility: Intelligibility reduced Word: 75-100% accurate Phrase: 75-100% accurate Sentence: 75-100% accurate Conversation: 75-100% accurate  Bedside Swallowing Assessment General Date of Onset: 04/28/20 Previous Swallow Assessment: none found in chart Diet Prior to this Study: NPO Temperature Spikes Noted: Yes Respiratory Status: Room air History of Recent Intubation: (procedure only 6/3) Behavior/Cognition: Alert;Confused;Distractible;Other (Comment)(didn't always follow directions) Oral Cavity - Dentition: Adequate natural dentition Self-Feeding Abilities: Able to feed self Patient Positioning: Upright in bed Baseline Vocal Quality: Wet;Low vocal intensity Volitional Cough:  Congested;Wet  Oral Care Assessment Does patient have any of the following "high(er) risk" factors?: Nutritional status - fluids only or NPO for >24 hours Does patient have any of the following "at risk" factors?: Other - dysphagia Patient is HIGH RISK: Non-ventilated: Order set for Adult Oral Care Protocol initiated - "High Risk Patients - Non-Ventilated" option selected  (see row information) Ice Chips Ice chips: Impaired Presentation: Spoon Pharyngeal Phase Impairments: Wet Vocal Quality;Multiple swallows;Cough - Immediate Thin Liquid Thin Liquid: Not tested Nectar Thick Nectar Thick Liquid: Not tested Honey Thick Honey Thick Liquid: Not tested Puree Puree: Impaired Pharyngeal Phase Impairments: Wet Vocal Quality;Cough - Immediate Solid Solid: Not tested BSE Assessment Risk for Aspiration Impact on safety and function: Moderate aspiration risk Other Related Risk Factors: Decreased management of secretions  Short Term Goals: Week 1: SLP Short Term Goal 1 (Week 1): Pt will consume therapeutic trials of ice chips and/or puree textures with minimal overt s/sx aspiration and Min A for use of swallow strategies. SLP Short Term Goal 2 (Week 1): Pt will participate in instrumental swallow study in order to assess pharyngeal swallow function. SLP Short Term Goal 3 (Week 1): Pt will sustain attention to functional tasks for 5 minute intervals with Mod a cues for redirection. SLP Short Term Goal 4 (Week 1): Pt will demonstrate carryover and recall of new and/or daily information with Mod A for use of memory strateiges/aids. SLP Short Term Goal 5 (Week 1): Pt will use strategies to compensate for wet vocal quality (throat clear, increased vocal intensity) to increase speech intelligibility to 85% at the sentence level.  Refer to Care Plan for Long Term Goals  Recommendations for other services: None   Discharge Criteria: Patient will be discharged from SLP if patient refuses treatment 3  consecutive times without medical reason, if treatment goals not met, if there is a change in medical status, if patient makes no progress towards goals or if patient is discharged from hospital.  The above assessment, treatment plan, treatment alternatives and goals were discussed and mutually agreed upon: by patient and family  [name] 04/28/2020, 3:22 PM

## 2020-04-29 DIAGNOSIS — T380X5A Adverse effect of glucocorticoids and synthetic analogues, initial encounter: Secondary | ICD-10-CM

## 2020-04-29 DIAGNOSIS — R739 Hyperglycemia, unspecified: Secondary | ICD-10-CM

## 2020-04-29 DIAGNOSIS — A419 Sepsis, unspecified organism: Secondary | ICD-10-CM

## 2020-04-29 DIAGNOSIS — G8918 Other acute postprocedural pain: Secondary | ICD-10-CM

## 2020-04-29 DIAGNOSIS — J690 Pneumonitis due to inhalation of food and vomit: Secondary | ICD-10-CM

## 2020-04-29 LAB — BLOOD CULTURE ID PANEL (REFLEXED)

## 2020-04-29 LAB — GLUCOSE, CAPILLARY
Glucose-Capillary: 247 mg/dL — ABNORMAL HIGH (ref 70–99)
Glucose-Capillary: 232 mg/dL — ABNORMAL HIGH (ref 70–99)
Glucose-Capillary: 204 mg/dL — ABNORMAL HIGH (ref 70–99)
Glucose-Capillary: 205 mg/dL — ABNORMAL HIGH (ref 70–99)

## 2020-04-29 MED ORDER — ORAL CARE MOUTH RINSE
15.0000 mL | Freq: Two times a day (BID) | OROMUCOSAL | Status: DC
  Administered 2020-04-29 – 2020-05-17 (×37): 15 mL via OROMUCOSAL

## 2020-04-29 MED ORDER — CHLORHEXIDINE GLUCONATE 0.12 % MT SOLN
15.0000 mL | Freq: Two times a day (BID) | OROMUCOSAL | Status: DC
  Administered 2020-04-29 – 2020-05-18 (×41): 15 mL via OROMUCOSAL
  Filled 2020-04-29 (×38): qty 15

## 2020-04-29 NOTE — Progress Notes (Signed)
PHARMACY - PHYSICIAN COMMUNICATION CRITICAL VALUE ALERT - BLOOD CULTURE IDENTIFICATION (BCID)  Johnathan Barker is an 79 y.o. male who presented to [facility] [place] on 04/27/2020 with diagnosis of cervical/thoracic myelopathy with functional deficits.  Assessment: started on antibiotics for possible aspiration PNA.  Blood cultures growing 2 of 4 Staph spp, MecA negative.  Now afebrile, WBC elevated but patient is also on steroid, stable on room air.  Name of physician (or Provider) Contacted: sent chat msg to Dr. Posey Pronto since no urgent need  Current antibiotics:  Cefepime and Flagyl  Changes to prescribed antibiotics recommended:  Patient is on recommended antibiotics - No changes needed  F/U clinical status and sensitivity to narrow cefepime further and D/C Flagyl soon  Results for orders placed or performed during the hospital encounter of 04/27/20  Blood Culture ID Panel (Reflexed) (Collected: 04/28/2020 12:51 PM)  Result Value Ref Range   Enterococcus species NOT DETECTED NOT DETECTED   Listeria monocytogenes NOT DETECTED NOT DETECTED   Staphylococcus species DETECTED (A) NOT DETECTED   Staphylococcus aureus (BCID) NOT DETECTED NOT DETECTED   Methicillin resistance NOT DETECTED NOT DETECTED   Streptococcus species NOT DETECTED NOT DETECTED   Streptococcus agalactiae NOT DETECTED NOT DETECTED   Streptococcus pneumoniae NOT DETECTED NOT DETECTED   Streptococcus pyogenes NOT DETECTED NOT DETECTED   Acinetobacter baumannii NOT DETECTED NOT DETECTED   Enterobacteriaceae species NOT DETECTED NOT DETECTED   Enterobacter cloacae complex NOT DETECTED NOT DETECTED   Escherichia coli NOT DETECTED NOT DETECTED   Klebsiella oxytoca NOT DETECTED NOT DETECTED   Klebsiella pneumoniae NOT DETECTED NOT DETECTED   Proteus species NOT DETECTED NOT DETECTED   Serratia marcescens NOT DETECTED NOT DETECTED   Haemophilus influenzae NOT DETECTED NOT DETECTED   Neisseria meningitidis NOT DETECTED NOT  DETECTED   Pseudomonas aeruginosa NOT DETECTED NOT DETECTED   Candida albicans NOT DETECTED NOT DETECTED   Candida glabrata NOT DETECTED NOT DETECTED   Candida krusei NOT DETECTED NOT DETECTED   Candida parapsilosis NOT DETECTED NOT DETECTED   Candida tropicalis NOT DETECTED NOT DETECTED    Danna Sewell D. Mina Marble, PharmD, BCPS, [place] 04/29/2020, 1:52 PM

## 2020-04-29 NOTE — Progress Notes (Signed)
Advanced NG tube 5 cm per last abd xray. Patient with congested & wet cough. Coughing up thick green sputum. Scattered rhonchi throughout. Paged Dr. Posey Pronto R/T CBG & patient NPO, orders to give SSI and hold amaryl. A & O x 1-2. No void thus far. Johnathan Barker A

## 2020-04-29 NOTE — Progress Notes (Signed)
Restless night. Continues with wet voice and congested cough. At 0100, voided 600cc's, bladder scan=183. NS infusing at 75cc/hr.Johnathan Barker A

## 2020-04-29 NOTE — Progress Notes (Signed)
[facility] PHYSICAL MEDICINE & REHABILITATION PROGRESS NOTE  Subjective/Complaints: Patient seen sitting up in bed this morning.  Overnight, patient had issues with NG placement.  Discussed with pharmacy antibiotics.  Discussed with family current course.  Discussed with therapies swallowing.  Patient states he wants to drink, attempted to educate patient.  ROS: Denies CP shortness of breath, nausea, vomiting, diarrhea.  Objective: Vital Signs: Blood pressure (!) 155/86, pulse (!) 102, temperature 97.7 F (36.5 C), resp. rate 18, height 6' (1.829 m), weight 92.5 kg, SpO2 97 %. DG Chest 2 View  Result Date: 04/28/2020 CLINICAL DATA:  Productive cough, patient coughing up copious amounts of yellow mucus. Hx of diabetes, hypertension. Nonsmoker. EXAM: CHEST - 2 VIEW COMPARISON:  04/27/2020 FINDINGS: Relatively low volumes. Mild peripheral and basilar interstitial somewhat reticular opacities as before. Some increase in coarse airspace opacities versus atelectasis in the posterior right lower lobe. Heart size upper limits normal.  No effusion. No pneumothorax. Visualized bones unremarkable. IMPRESSION: Worsening posterior right lower lobe airspace disease Electronically Signed   By: Lucrezia Europe M.D.   On: 04/28/2020 11:31   DG Chest 2 View  Result Date: 04/27/2020 CLINICAL DATA:  Wheezing.  Cough. EXAM: CHEST - 2 VIEW COMPARISON:  None. FINDINGS: Low lung volumes. Upper normal heart size with normal mediastinal contours. There are fine reticular opacities in a peripheral and basilar predominant distribution. No confluent airspace disease. No pleural effusion or pneumothorax. Degenerative change in the spine. No acute osseous abnormalities are seen. IMPRESSION: Fine reticular opacities in a peripheral and basilar predominant distribution, suspicious for interstitial lung disease. Possibility of atypical infection is also considered. Consider follow-up high-resolution chest CT. Electronically Signed   By:  Keith Rake M.D.   On: 04/27/2020 20:51   DG Abd 1 View  Result Date: 04/28/2020 CLINICAL DATA:  Follow-up nasogastric tube placement. EXAM: ABDOMEN - 1 VIEW COMPARISON:  April 28, 2020 (7:16 p.m.) FINDINGS: A nasogastric tube is seen with its distal tip noted within the body of the stomach. This is approximately 8 cm distal to the gastroesophageal junction. The bowel gas pattern is normal. Multiple bilateral small renal calculi are noted. IMPRESSION: 1. Nasogastric tube positioning, as described above. Further advancement of the NG tube by approximately 5 cm is recommended to further decrease the risk of aspiration. 2. Multiple bilateral small renal calculi. Electronically Signed   By: Virgina Norfolk M.D.   On: 04/28/2020 23:40   DG Abd 1 View  Result Date: 04/28/2020 CLINICAL DATA:  NG tube placement EXAM: ABDOMEN - 1 VIEW COMPARISON:  Radiograph 04/28/2020 FINDINGS: Nasogastric tube again seen curling in the lower thoracic esophagus, redirected superiorly. Redemonstration of a single air distended loop of what appears to be colon in the mid to lower abdomen. Is unchanged in appearance from comparison study. Bowel gas pattern is otherwise unremarkable. Calcifications overlie the bilateral renal shadows, predominantly upper pole of the right lower pole on the left. Severe multilevel discogenic change throughout the spine with exuberant osteophyte formation. Additional degenerative changes of the SI joints and both hips. IMPRESSION: 1. Stable appearance of a single air distended loop of what appears to be colon in the mid to lower abdomen. No high-grade obstructive pattern. 2. Nasogastric tube remains curled in the lower thoracic esophagus, redirected superiorly. Recommend repositioning. 3. Bilateral nephrolithiasis. Electronically Signed   By: Lovena Le M.D.   On: 04/28/2020 19:50   DG Abd 1 View  Result Date: 04/28/2020 CLINICAL DATA:  Nasogastric tube placement. EXAM: ABDOMEN -  1 VIEW  COMPARISON:  None. FINDINGS: A nasogastric tube is seen. Its distal end is looped within the distal aspect of the esophagus. The bowel gas pattern is normal. Numerous subcentimeter soft tissue calcifications are seen overlying in the expected region of the right kidney. An additional 6 mm soft tissue calcification is seen projecting over the expected region of the lower pole of the left kidney. Multilevel marked severity degenerative changes seen throughout the lumbar spine. IMPRESSION: 1. Nasogastric tube looped within the distal esophagus. Repositioning is recommended. 2. Bilateral renal calculi. 3. No evidence of bowel obstruction or ileus. Electronically Signed   By: Virgina Norfolk M.D.   On: 04/28/2020 18:31   Recent Labs    04/27/20 0757 04/28/20 0813  WBC 16.0* 17.5*  HGB 14.0 14.7  HCT 42.7 43.4  PLT 306 310   Recent Labs    04/27/20 0757 04/28/20 0813  NA 136 136  K 4.1 4.2  CL 104 99  CO2 23 26  GLUCOSE 249* 211*  BUN 18 13  CREATININE 0.92 0.96  CALCIUM 8.4* 8.6*    Physical Exam: BP (!) 155/86   Pulse (!) 102   Temp 97.7 F (36.5 C)   Resp 18   Ht 6' (1.829 m)   Wt 92.5 kg   SpO2 97%   BMI 27.66 kg/m   Constitutional: No distress . Vital signs reviewed. HENT: Normocephalic.  Atraumatic. Eyes: EOMI. No discharge. Cardiovascular: No JVD. Respiratory: Normal effort.  No stridor.  Scattered rhonchi. GI: Non-distended. Skin: Right neck edema, dressing C/D/I Psych: Confusion noted  Musc: No edema in extremities.  No tenderness in extremities. Neurological: Alert  Delayed processing Motor: 4+/5 throughout, unchanged  Assessment/Plan: 1. 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  Physiatrist is providing close team supervision and 24 hour management of active medical problems listed below.  Physiatrist and rehab team continue  to assess barriers to discharge/monitor patient progress toward functional and medical goals  Care Tool:  Bathing    Body parts bathed by patient: Chest, Abdomen, Front perineal area, Right upper leg, Left upper leg, Face   Body parts bathed by helper: Right arm, Left arm, Buttocks, Right lower leg, Left lower leg     Bathing assist Assist Level: Moderate Assistance - Patient 50 - 74%     Upper Body Dressing/Undressing Upper body dressing   What is the patient wearing?: Pull over shirt    Upper body assist Assist Level: Minimal Assistance - Patient > 75%    Lower Body Dressing/Undressing Lower body dressing      What is the patient wearing?: Pants(shorts replaced with brief)     Lower body assist Assist for lower body dressing: Minimal Assistance - Patient > 75%     Toileting Toileting    Toileting assist Assist for toileting: Moderate Assistance - Patient 50 - 74%     Transfers Chair/bed transfer  Transfers assist     Chair/bed transfer assist level: Maximal Assistance - Patient 25 - 49%     Locomotion Ambulation   Ambulation assist      Assist level: Moderate Assistance - Patient 50 - 74% Assistive device: Walker-rolling Max distance: 5'   Walk 10 feet activity   Assist  Walk 10 feet activity did not occur: Safety/medical concerns        Walk 50 feet activity   Assist Walk 50 feet with 2 turns  activity did not occur: Safety/medical concerns         Walk 150 feet activity   Assist Walk 150 feet activity did not occur: Safety/medical concerns         Walk 10 feet on uneven surface  activity   Assist Walk 10 feet on uneven surfaces activity did not occur: Safety/medical concerns         Wheelchair     Assist Will patient use wheelchair at discharge?: (TBD)   Wheelchair activity did not occur: Safety/medical concerns         Wheelchair 50 feet with 2 turns activity    Assist    Wheelchair 50 feet with 2  turns activity did not occur: Safety/medical concerns       Wheelchair 150 feet activity     Assist Wheelchair 150 feet activity did not occur: Safety/medical concerns          Medical Problem List and Plan: 1.  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  Continue CIR  Updated family on status and recovery  Steroids started on 6/5 2.  Antithrombotics: -DVT/anticoagulation:  Mechanical: Sequential compression devices, below knee Bilateral lower extremities             -antiplatelet therapy: N/A 3. Pain Management: Tylenol prn. Does not like Oxycodone.  Ordered ultram prn  Appears controlled on 6/6  Monitor with increased exertion as well as cognition side effects 4. Mood: LCSW to follow for evaluation and support.              -antipsychotic agents: N/A 5. Neuropsych: This patient is not fully capable of making decisions on his own behalf. 6. Skin/Wound Care: Monitor wound for healing.  7. Fluids/Electrolytes/Nutrition: Encourage fluid intake. Monitor I/O.  8. HTN: Monitor BP -continue amlodipine, Irbesartan, and metoprolol.   Mildly elevated on 6/6  Monitor with increased mobility 9.  Steroid-induced hyperglycemia on T2DM with hyperglycemia: Hgb A1c- 7.5. Monitor BS ac/hs. Used amaryl 4 mg bid and lantus 2 units HS.   Amaryl on hold at present   Continue to taper lantus to home dose.   Elevated on 6/6, monitor when off steroids  Monitor with increased mobility 10. Leucocytosis: Likely secondary to steroids.  Monitor for fever or other signs of infection.              WBC 17.5 on 6/5, labs ordered for tomorrow  Afebrile  Continue to monitor 11. Constipation:              Adjust bowel meds as necessary 12. Hyperbilirubinemia:   Elevated on 6/5, continue to monitor 13. Dysphagia: ST to follow for evaluation.   N.p.o. with NG at present  IV fluids initiated  Will consider  starting tube feeds tomorrow if no improvement  Advance diet as tolerated 14.  Urinary retention             PVRs with retention, appears to be improving 15.  Sepsis-aspiration pneumonia  Repeat chest x-ray personally reviewed, showing right lower lobe consolidation.  Follow-up x-ray ordered for tomorrow.  Discussed with pharmacy due to antibiotic coverage and allergies.  IV cefepime initiated along with IV Flagyl   LOS: 2 days A FACE TO FACE EVALUATION WAS PERFORMED  Laroy Mustard Lorie Phenix 04/29/2020, 11:59 AM

## 2020-04-30 ENCOUNTER — Inpatient Hospital Stay (HOSPITAL_COMMUNITY): Payer: Medicare Other

## 2020-04-30 ENCOUNTER — Inpatient Hospital Stay (HOSPITAL_COMMUNITY): Payer: Medicare Other | Admitting: Occupational Therapy

## 2020-04-30 DIAGNOSIS — R1313 Dysphagia, pharyngeal phase: Secondary | ICD-10-CM

## 2020-04-30 LAB — CBC
HCT: 44.4 % (ref 39.0–52.0)
Hemoglobin: 15 g/dL (ref 13.0–17.0)
MCH: 30.3 pg (ref 26.0–34.0)
MCHC: 33.8 g/dL (ref 30.0–36.0)
MCV: 89.7 fL (ref 80.0–100.0)
Platelets: 400 10*3/uL (ref 150–400)
RBC: 4.95 MIL/uL (ref 4.22–5.81)
RDW: 12.7 % (ref 11.5–15.5)
WBC: 18.7 10*3/uL — ABNORMAL HIGH (ref 4.0–10.5)
nRBC: 0 % (ref 0.0–0.2)

## 2020-04-30 LAB — BASIC METABOLIC PANEL
Anion gap: 12 (ref 5–15)
BUN: 23 mg/dL (ref 8–23)
CO2: 21 mmol/L — ABNORMAL LOW (ref 22–32)
Calcium: 8.5 mg/dL — ABNORMAL LOW (ref 8.9–10.3)
Chloride: 107 mmol/L (ref 98–111)
Creatinine, Ser: 0.95 mg/dL (ref 0.61–1.24)
GFR calc Af Amer: >60 mL/min (ref >60)
GFR calc non Af Amer: >60 mL/min (ref >60)
Glucose, Bld: 264 mg/dL — ABNORMAL HIGH (ref 70–99)
Potassium: 3.9 mmol/L (ref 3.5–5.1)
Sodium: 140 mmol/L (ref 135–145)

## 2020-04-30 LAB — GLUCOSE, CAPILLARY
Glucose-Capillary: 205 mg/dL — ABNORMAL HIGH (ref 70–99)
Glucose-Capillary: 220 mg/dL — ABNORMAL HIGH (ref 70–99)
Glucose-Capillary: 259 mg/dL — ABNORMAL HIGH (ref 70–99)
Glucose-Capillary: 294 mg/dL — ABNORMAL HIGH (ref 70–99)
Glucose-Capillary: 267 mg/dL — ABNORMAL HIGH (ref 70–99)

## 2020-04-30 IMAGING — DX DG CHEST 2V
2 series · 3 of 3 positions shown · non-contrast
Comparison: Radiograph [DATE]

CLINICAL DATA: Abnormal chest radiograph

EXAM:
CHEST - 2 VIEW

[Series 2: chest lat · 0.14mm/px · 2 of 2 slices shown]
[im 1/2]
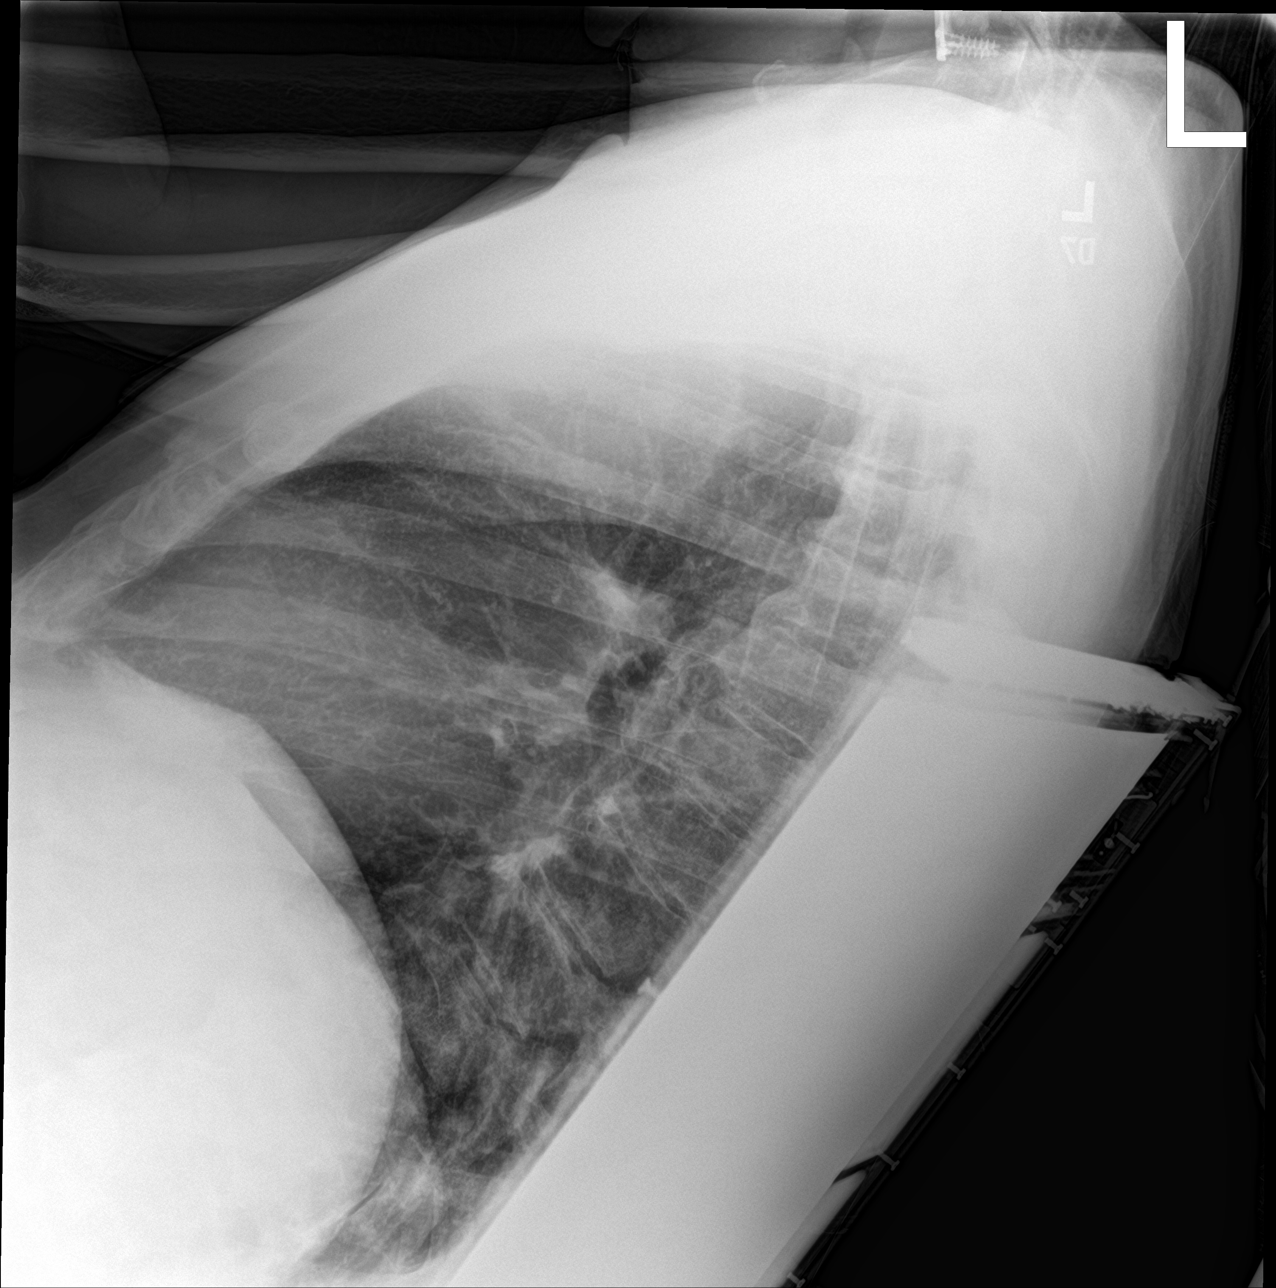
[im 2/2]
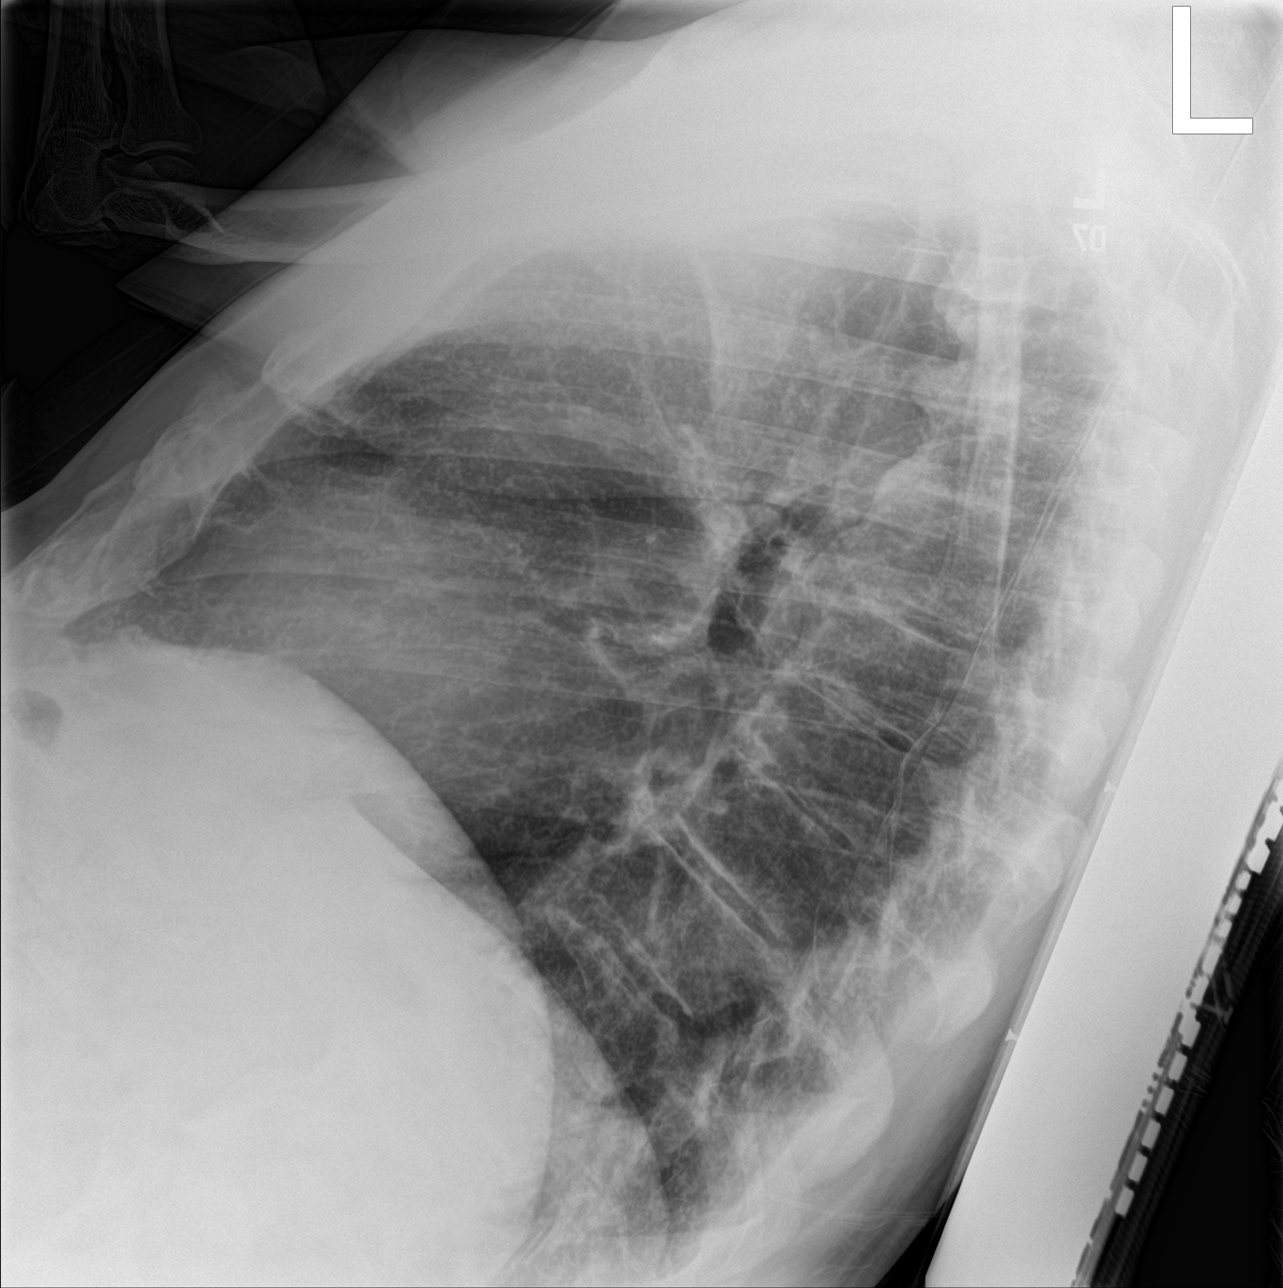

[chest ap]
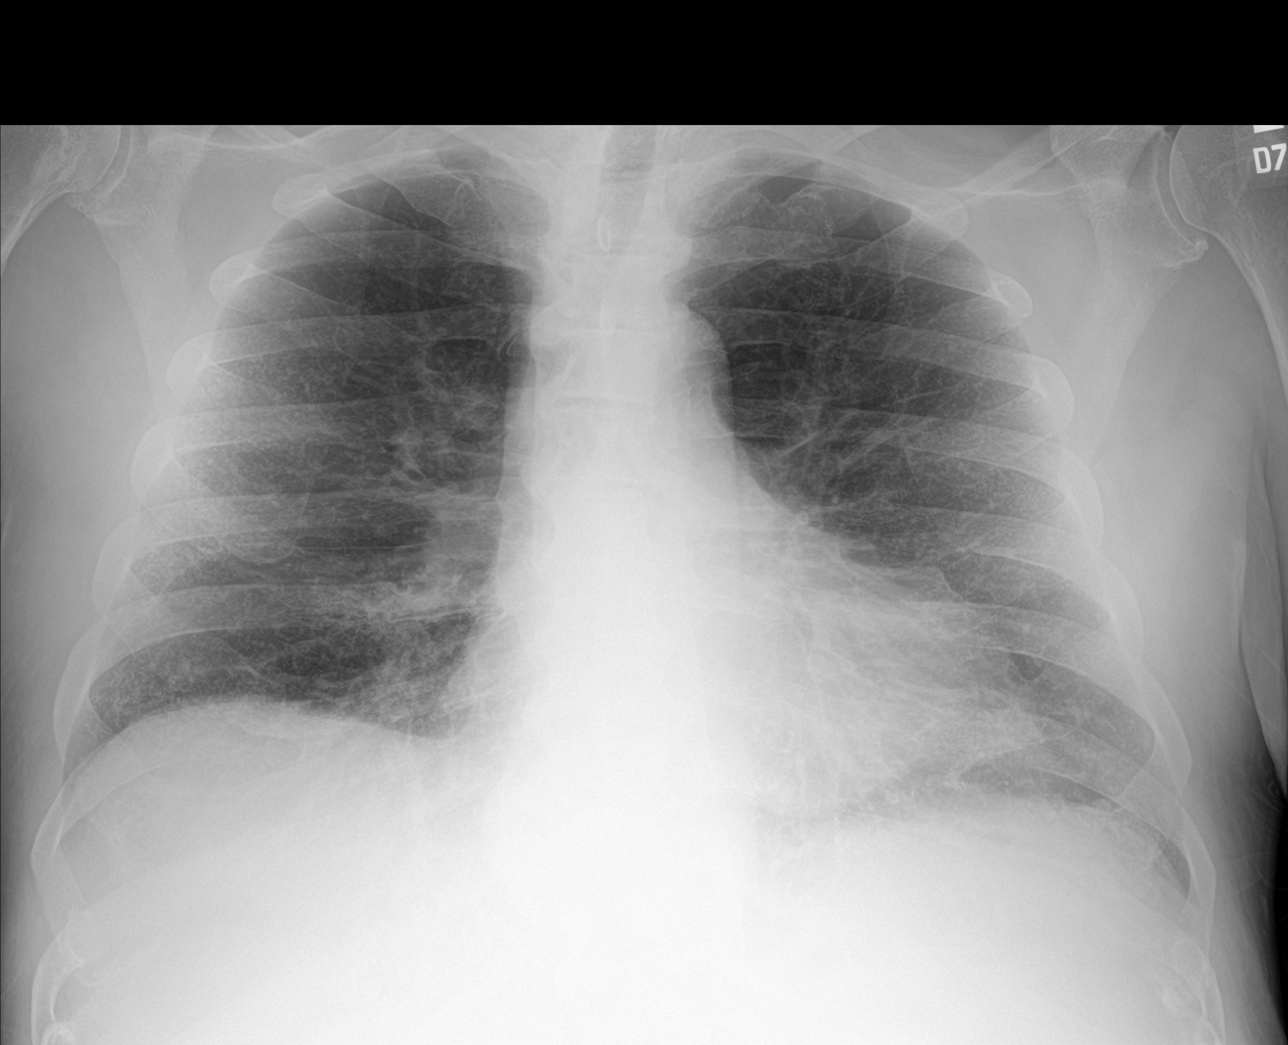

[3 of 3 positions shown; findings below may reference images not displayed]

FINDINGS: Diffuse reticular opacities throughout both lungs are similar to
prior with more coalescent opacity in the posterior right lower
lobe. Overall extent is similar to the comparison radiograph. No new
areas of consolidative airspace disease. Some streaky opacities
towards the bases likely reflecting some atelectatic changes well.
No effusion or pneumothorax. Stable cardiomediastinal contours. No
acute osseous or soft tissue abnormality. Degenerative changes are
present in the imaged spine and shoulders.
IMPRESSION: Diffuse reticular opacities throughout both lungs are similar to
prior with persistent coalescent opacity in the posterior right
lower lobe. Overall extent not significantly changed from prior
accounting for some shifting atelectatic changes.

## 2020-04-30 MED ORDER — GLUCERNA 1.5 CAL PO LIQD
1000.0000 mL | ORAL | Status: DC
  Administered 2020-04-30 – 2020-05-15 (×15): 1000 mL
  Filled 2020-04-30 (×26): qty 1000

## 2020-04-30 MED ORDER — PRO-STAT SUGAR FREE PO LIQD
30.0000 mL | Freq: Every day | ORAL | Status: DC
  Administered 2020-04-30 – 2020-05-16 (×17): 30 mL
  Filled 2020-04-30 (×16): qty 30

## 2020-04-30 MED ORDER — SODIUM CHLORIDE 0.9 % IV SOLN: INTRAVENOUS | Status: DC

## 2020-04-30 NOTE — Progress Notes (Signed)
At [facility], patient yelling, upon entering room patient had removed NG tube and honeycomb dressing to anterior neck. Foam dressing applied. Patient reported NG tube was hung on fridge, so he had to pull it out. A & O to self only. Difficult to reorient. NS infusing at 75cc/hr. Continuously. Remains NPO. At 0400, patient was adamant about getting envelope from his grandchildren, when staff unable to locate envelope, patient became agitated and attempted to get OOB. Required 1:1 for 30 minutes. Assisted with I.S., able to get up to 1500. Continues with congested cough, thick yellow sputum. Congestion in upper airway/throat. Rhonchi to all lobes. Continent Using urinal. Patrici Ranks A

## 2020-04-30 NOTE — Progress Notes (Signed)
Speech Language Pathology Daily Session Note  Patient Details  Name: Johnathan Barker MRN: [id number] Date of Birth: August 05, 1941  Today's Date: 04/30/2020 SLP Individual Time: 1305-1330 SLP Individual Time Calculation (min): 25 min  Short Term Goals: Week 1: SLP Short Term Goal 1 (Week 1): Pt will consume therapeutic trials of ice chips and/or puree textures with minimal overt s/sx aspiration and Min A for use of swallow strategies. SLP Short Term Goal 2 (Week 1): Pt will participate in instrumental swallow study in order to assess pharyngeal swallow function. SLP Short Term Goal 3 (Week 1): Pt will sustain attention to functional tasks for 5 minute intervals with Mod a cues for redirection. SLP Short Term Goal 4 (Week 1): Pt will demonstrate carryover and recall of new and/or daily information with Mod A for use of memory strateiges/aids. SLP Short Term Goal 5 (Week 1): Pt will use strategies to compensate for wet vocal quality (throat clear, increased vocal intensity) to increase speech intelligibility to 85% at the sentence level.  Skilled Therapeutic Interventions: Skilled ST services focused on swallow skills. Pt's family member Arrie Aran was present for treatment.  Pt demonstrated primarily wet vocal quality when speaking, SLP instructed pt to manage secretions with throat clear/cough and swallow, inwhich would result in clear vocal quality only for 1-2 words then quality would become wet again. Pt required max A verbal cues to continuously utilize strategy. SLP provided oral care piror to trials of ice chips, pt demonstrated immediate cough or throat clear on 60% of trials, along with continued wet vocal quality. SLP ordered MBS for tomorrow to further investigate swallow function and least restrictive diet. Dawn requested education on anatomy and presents of wet vocal quality/congestion. SLP provided education utilizing visual aids to explain laryngeal anatomy and possible causes of wet vocal  quality. All questions were answered to satisfaction.  Pt was left in room with family member, call bell within reach and bed/chair alarm set. ST recommends to continue skilled ST services.      Pain Pain Assessment Pain Score: 0-No pain  Therapy/Group: Individual Therapy  Haizel Gatchell  [facility] 04/30/2020, 4:07 PM

## 2020-04-30 NOTE — Progress Notes (Signed)
Inpatient Rehabilitation Care Coordinator Assessment and Plan  Patient Details  Name: Johnathan Barker MRN: [id number] Date of Birth: 1941-09-13  Today's Date: 04/30/2020  Problem List:  Patient Active Problem List   Diagnosis Date Noted  . Sepsis without acute organ dysfunction ([place])   . Aspiration pneumonia of right lower lobe ([place])   . Steroid-induced hyperglycemia   . Postoperative pain   . Urinary retention   . Dysphagia   . Constipation   . Type 2 diabetes mellitus with hyperglycemia, with long-term current use of insulin ([place])   . Benign essential HTN   . Post-operative pain   . Abnormal chest x-ray   . FUO (fever of unknown origin)   . 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 Controlled type 2 diabetes mellitus with hyperglycemia, with long-term current use of insulin ([place])   . Drug induced constipation   . Leucocytosis   . Hyperbilirubinemia   . Myelopathy ([place]) 04/26/2020  . Weakness of right lower extremity 04/24/2020  . Essential hypertension 04/24/2020  . Diabetes mellitus type 2 in nonobese ([place]) 04/24/2020  . Cord compression ([place]) 04/24/2020  . 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 Arthritis    knees  . Diabetes mellitus without complication ([place])    type II  . Dysrhythmia   . History of kidney stones    lithotripsy  . Hypertension   . Weakness of both legs 04/24/2020   Past Surgical History:  Past Surgical History:  Procedure Laterality Date  . ANTERIOR CERVICAL DECOMP/DISCECTOMY FUSION N/A 04/26/2020   Procedure: Cervical Three-Four anterior cervical decompression/discectomy/fusion with plate;  Surgeon: Eustace Moore, MD;  Location: [facility];  Service: Neurosurgery;  Laterality: N/A;  anterior, Part-1  . EYE SURGERY Bilateral    cataracts removed= IOL  . LUMBAR LAMINECTOMY/DECOMPRESSION MICRODISCECTOMY N/A 04/26/2020   Procedure: Thoracic Ten-Eleven Laminectomy;  Surgeon: Eustace Moore, MD;  Location: [facility];  Service: Neurosurgery;  Laterality: N/A;  Posterior, Part-2  . TONSILLECTOMY    . TOTAL KNEE ARTHROPLASTY Right 11/30/2017   Procedure: RIGHT TOTAL KNEE ARTHROPLASTY;  Surgeon: Vickey Huger, MD;  Location: [facility];  Service: Orthopedics;  Laterality: Right;  . TOTAL KNEE ARTHROPLASTY Left 09/13/2018   Procedure: LEFT TOTAL KNEE ARTHROPLASTY;  Surgeon: Vickey Huger, MD;  Location: [facility];  Service: Orthopedics;  Laterality: Left;  with block   Social History:  reports that he has never smoked. He has never used smokeless tobacco. He reports that he does not drink alcohol or use drugs.  Family / Support Systems Patient Roles: Spouse Spouse/Significant Other: Butch Penny Anticipated Caregiver: Spouse Ability/Limitations of Caregiver: none (patient MOD I to Independent) Caregiver Availability: 24/7  Social History Preferred language: English Religion: Christian Read: Yes Write: Yes Employment Status: Disabled   Abuse/Neglect Abuse/Neglect Assessment Can Be Completed: Yes Physical Abuse: Denies Verbal Abuse: Denies Sexual Abuse: Denies Exploitation of patient/patient's resources: Denies Self-Neglect: Denies  Emotional Status Recent Psychosocial Issues: no Psychiatric History: no Substance Abuse History: no  Patient / Family Perceptions, Expectations & Goals Pt/Family understanding of illness & functional limitations: yes Pt/family expectations/goals: Goal to discharge home with spouse  [employer]: None Premorbid Home Care/DME Agencies: None Transportation available at discharge: spouse able to transport  Discharge Planning Living Arrangements: Spouse/significant other [place]: Spouse/significant other Type of Residence: Private residence(3 steps to enter, left side rails) Insurance Resources: Chartered certified accountant Resources: SSD Does the patient have  any problems obtaining your medications?: No Care Coordinator  Barriers to Discharge: Medical stability Care Coordinator Anticipated Follow Up Needs: HH/OP Expected length of stay: 14-16 Days  Clinical Impression Sw entered room, spouse at bedside. SW introduced self, explained role and process. Addressed questions and concerns with spouse. SW will continue to follow up with questions/concerns  Dyanne Iha 04/30/2020, 1:40 PM

## 2020-04-30 NOTE — Progress Notes (Signed)
At 8:30 NS called me to the deck and told me that patient had removed his IV, went into the room both IV were out. Patient A&O to self but very confused. IV consult placed for new IV. We continue to monitor.

## 2020-04-30 NOTE — Progress Notes (Signed)
Inpatient [facility] Individual Statement of Services  Patient Name:  Johnathan Barker  Date:  04/30/2020  Welcome to the [facility].  Our goal is to provide you with an individualized program based on your diagnosis and situation, designed to meet your specific needs.  With this comprehensive rehabilitation program, you will be expected to participate in at least 3 hours of rehabilitation therapies Monday-Friday, with modified therapy programming on the weekends.  Your rehabilitation program will include the following services:  Physical Therapy (PT), Occupational Therapy (OT), Speech Therapy (ST), 24 hour per day rehabilitation nursing, Therapeutic Recreaction (TR), Neuropsychology, Care Coordinator, Rehabilitation Medicine, Nutrition Services, Pharmacy Services and Other  Weekly team conferences will be held on Wednesdays to discuss your progress.  Your Inpatient Rehabilitation Care Coordinator will talk with you frequently to get your input and to update you on team discussions.  Team conferences with you and your family in attendance may also be held.  Expected length of stay: 14-16 Days  Overall anticipated outcome: MOD I to Supervision  Depending on your progress and recovery, your program may change. Your Inpatient Rehabilitation Care Coordinator will coordinate services and will keep you informed of any changes. Your Inpatient Rehabilitation Care Coordinator's name and contact numbers are listed  below.  The following services may also be recommended but are not provided by the [facility]:    [place] will be made to provide these services after discharge if needed.  Arrangements include referral to agencies that provide these services.  Your insurance has been verified to be: Medicare Your primary doctor is:  Nelda Bucks, MD  Pertinent  information will be shared with your doctor and your insurance company.  Inpatient Rehabilitation Care Coordinator:  Erlene Quan, [place] or [phone]  Information discussed with and copy given to patient by: Dyanne Iha, 04/30/2020, 10:18 AM

## 2020-04-30 NOTE — Progress Notes (Signed)
Occupational Therapy Session Note  Patient Details  Name: Johnathan Barker MRN: [id number] Date of Birth: Jan 12, 1941  Today's Date: 04/30/2020 OT Individual Time: 1030-1130 OT Individual Time Calculation (min): 60 min    Short Term Goals: Week 1:  OT Short Term Goal 1 (Week 1): Pt will complete toilet or BSC transfer with 1 assist without Stedy OT Short Term Goal 2 (Week 1): Pt will complete LB dressing sit<stand with Min balance assist OT Short Term Goal 3 (Week 1): Pt will complete one grooming task while standing or in supported standing position near the sink to improve balance  Skilled Therapeutic Interventions/Progress Updates:    Treatment session with focus on functional transfers, sit > stand, standing balance, and sequencing/awareness during table top task.  Pt received supine in bed reporting already dressed this AM and declining bathing and dressing unless he could take a shower.  Educated on not medically cleared for shower at this time.  Completed bed mobility with min assist and cues for log rolling to maintain back precautions during mobility.  Completed stand pivot transfer bed > w/c with mod assist and multimodal cues for sequencing and weight shifting.  Engaged in sit > stand from therapy mat with focus on weight shifting and foot placement as pt with RLE ataxia and decreased awareness/proprioception with picking up RLE intermittently during standing, even causing pt to loose his balance.  Therapist positioned herself to provide increased tactile cues and feedback to RLE to increase standing balance and RLE maintain contact with floor.  Engaged in pipe tree activity in sitting and standing with focus on sequencing, awareness of errors, and increased dynamic standing balance.  Mod assist sit > stand and min-mod assist standing balance due to RLE ataxia.  Pt required max multimodal cues for problem solving and sequencing to complete pipe tree puzzle, therapist often decreasing choice  of pieces to increase success.  Pt returned to room and remained upright in w/c with wife and son present.  Therapist applied seat belt alarm for safety as pt with decreased safety awareness and impulsivity.  Remained upright in w/c with call bell in reach and family present.  Therapy Documentation Precautions:  Precautions Precautions: Cervical, Back, Fall Precaution Comments: no brace needed Restrictions Weight Bearing Restrictions: No Vital Signs: Therapy Vitals Pulse Rate: 92 BP: (!) 153/80 Pain:  Pt with no c/o pain   Therapy/Group: Individual Therapy  [name] 04/30/2020, 12:10 PM

## 2020-04-30 NOTE — Progress Notes (Signed)
Inpatient Rehabilitation  Patient information reviewed and entered into eRehab system by Tanica Gaige M. Wisam Siefring, M.A., CCC/SLP, PPS Coordinator.  Information including medical coding, functional ability and quality indicators will be reviewed and updated through discharge.    

## 2020-04-30 NOTE — Progress Notes (Signed)
At 3 pm this RN was on break when NS called him and said patient has removed his cortrak while with PT. Patient assessed, PA notified and new order given for cortrak replacement. cortrak team notified and they came and replaced it. PA ordered bilateral wrist restraint to prevent patient from pulling tube again. Patient and family members educated on wrist restraint and all questions answered. We continue to monitor.

## 2020-04-30 NOTE — Procedures (Signed)
Cortrak  Person Inserting Tube:  Jacklynn Barnacle E, RD Tube Type:  Cortrak - 43 inches Tube Location:  Left nare Initial Placement:  Stomach Secured by: Bridle Technique Used to Measure Tube Placement:  Documented cm marking at nare/ corner of mouth Cortrak Secured At:  76 cm    Cortrak Tube Team Note:  Consult received to place a Cortrak feeding tube.   No x-ray is required. RN may begin using tube.   If the tube becomes dislodged please keep the tube and contact the Cortrak team at [email or web address] ([email or web address]) for replacement.  If after hours and replacement cannot be delayed, place a NG tube and confirm placement with an abdominal x-ray.   Jacklynn Barnacle, MS, RD, LDN Pager number available on Amion

## 2020-04-30 NOTE — Progress Notes (Signed)
Initial Nutrition Assessment  DOCUMENTATION CODES:   Not applicable  INTERVENTION:   Tube feeding via Cortrak: - Glucerna 1.5 @ 65 ml/hr (can be held for up to 4 hours for therapies) - Pro-stat 30 ml daily - Free water per MD/PA  Tube feeding regimen provides 2050 kcal, 122 grams of protein, and 987 ml of H2O.   NUTRITION DIAGNOSIS:   Inadequate oral intake related to dysphagia as evidenced by NPO status.  GOAL:   Patient will meet greater than or equal to 90% of their needs  MONITOR:   Diet advancement, Labs, Weight trends, TF tolerance, Skin, I & O's  REASON FOR ASSESSMENT:   Consult Enteral/tube feeding initiation and management  ASSESSMENT:   79 year old male with PMH of HTN, T2DM, BLE weakness with difficulty walking over past couple of months with frequent falls. Admitted on 04/24/20 with lower extremity weakness and feeling cold. 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  Also found to have right thyroid nodule with recommendations for thyroid ultrasound. On 04/26/20 pt underwent ACDF of C3/C4 and decompressive thoracic laminectomy/medial facetectomy. Admitted to CIR on 04/27/20.   6/05 - concern for aspiration, pt made NPO, NGT placed 6/07 - pt removed NGT overnight, Cortrak placed (tip gastric per Cortrak team)  Spoke with pt and family at bedside. Pt reports that PTA, he had a good appetite and ate 3 meals daily. Pt's family confirms. Pt reports his UBW s 220 lbs and states he thinks he has lost some weight. Weight history in chart is limited as last available weight PTA is from 2019 but shows pt's weight is stable compared to that time.  Discussed TF with RN.  Medications reviewed and include: Pepcid, SSI, glimepiride, Lantus 10 units daily, solu-medrol, IV abx, NS @ 75  ml/hr  Labs reviewed. CBG's: 204-259 x 24 hours  UOP: 1650 ml x 24 hours  NUTRITION - FOCUSED PHYSICAL EXAM:    Most Recent Value  Orbital Region  No depletion  Upper Arm Region  No depletion  Thoracic and Lumbar Region  No depletion  Buccal Region  No depletion  Temple Region  No depletion  Clavicle Bone Region  Mild depletion  Clavicle and Acromion Bone Region  Mild depletion  Scapular Bone Region  No depletion  Dorsal Hand  No depletion  Patellar Region  No depletion  Anterior Thigh Region  No depletion  Posterior Calf Region  No depletion  Edema (RD Assessment)  None  Hair  Reviewed  Eyes  Reviewed  Mouth  Reviewed  Skin  Reviewed  Nails  Reviewed       Diet Order:   Diet Order            Diet NPO time specified  Diet effective now              EDUCATION NEEDS:   Education needs have been addressed  Skin:  Skin Assessment: Skin Integrity Issues: Incisions: neck, back  Last BM:  04/29/20 smear type 7  Height:   Ht Readings from Last 1 Encounters:  04/27/20 6' (1.829 m)    Weight:   Wt Readings from Last 1 Encounters:  04/27/20 92.5 kg    BMI:  Body mass index is 27.66 kg/m.  Estimated Nutritional Needs:   Kcal:  2100-2300  Protein:  110-125 grams  Fluid:  >/= 2.0 L    Gaynell Face, MS, RD, LDN Inpatient Clinical Dietitian Pager: [phone] Weekend/After Hours: [phone]

## 2020-04-30 NOTE — Progress Notes (Signed)
Physical Therapy Session Note  Patient Details  Name: Johnathan Barker MRN: [id number] Date of Birth: 16-Jun-1941  Today's Date: 04/30/2020 PT Individual Time: 0900-0940 and 1430-1526  PT Individual Time Calculation (min): 40 min and 56 min Today's Date: 04/30/2020 PT Missed Time: 20 Minutes Missed Time Reason: Other (Comment)(NG tube placement)  Short Term Goals: Week 1:  PT Short Term Goal 1 (Week 1): Pt will complete least restrictive transfer with min A consistently PT Short Term Goal 2 (Week 1): Pt will ambulate x 50 ft with LRAD and mod A PT Short Term Goal 3 (Week 1): Pt will maintain dynamic standing balance with min A x 5 min  Skilled Therapeutic Interventions/Progress Updates:   Treatment Session 1: 1324-4010 40 min Received pt supine in bed, pt agreeable to therapy, and denied any pain during session. MD and RN present in room discussing pt's current status and increased confusion. Pt very verbose and randomly rambling throughout session with severely increased confusion requiring cues for redirection. Pt required cues to maintain low back and cervical precautions throughout session. Session with emphasis on dressing, functional mobility/transfers, generalized strengthening, dynamic standing balance/coordination, and improved activity tolerance. Pt donned pants in supine with mod A and rolled to L and R with supervision and use of bedrails with verbal cues to maintain back precautions. Pt transferred supine<>sitting EOB with min A and doffed dirty shirt and donned clean one with mod A. Pt transferred stand<>pivot bed<>WC without AD mod A. Pt reported urge to use restroom and transferred WC<>toilet with bedside commode over top with mod A using grab bars and required max A to doff pants/brief. Pt able to void and transferred stand<>pivot toilet with bedside commode over top<>WC mod A with max A to pull pants/brief over hips. Pt washed hands and combed hair seated in WC at sink with  supervision. Pt transported to therapy gym in [facility] total assist for time management purposes. Pt transferred stand<>pivot WC<>mat with RW min A with cues for stepping sequence and turning technique. Pt transferred sit<>stand with RW 2x5 reps with min A from elevated mat with cues for hand placement on RW and mat. RN present and reported pt needed to return to room for NG tube placement. Pt transferred mat<>WC stand<>pivot with RW mod A and transported back to room in [facility] total assist. Pt transferred WC<>bed stand<>pivot without AD mod A with cues for sequencing and sit<>supine with CGA. Concluded session with pt supine in bed, needs within reach, bed alarm on and RN and dietitian present at bedside. 20 minutes missed skilled physical therapy due to NG tube placement.   Treatment Session 2: 1430-1526 56 min Received pt sitting in recliner with family present at bedside, pt agreeable to therapy, and denied any pain during session. Session with emphasis on functional mobility/transfers, generalized strengthening, dynamic standing balance/coordinaiton, simulated car transfers, and improved activity tolerance. Pt transferred recliner<>WC stand<>pivot without AD max A and transported to ortho gym in [facility] total assist. Pt performed simulated car transfer without AD max A. Pt required cues for technique, turning and stepping sequence, and safety as pt trying to stand on on leg and lift the other into the car sideways rather than turning and backing up to sit. Pt reported intense urge to have BM and was transported back to room in [facility] total assist. Pt transferred WC<>toilet with bedside commode over top mod A using grab bars and doffed pants/brief with max A. While therapist went to retreive clean brief pt pulled out NG  tube. RN notified and present to assess, cut, and remove NG tube. Pt unable to have BM despite feeling like he needed to and extensive time. Doffed soiled brief and donned clean one max A. Pt transferred bedside  commode with toilet over top<>WC stand<>pivot using grab bars mod A and required max A to pull pants/brief over hips. Concluded session with pt sitting in WC, needs within reach, and seatbelt alarm on. Updated RN on pt's current status and RN reporting pt will be getting mitts to prevent pulling out any further lines.   Therapy Documentation Precautions:  Precautions Precautions: Cervical, Back, Fall Precaution Comments: no brace needed Restrictions Weight Bearing Restrictions: No  Therapy/Group: Individual Therapy [name] PT, DPT   04/30/2020, 7:19 AM

## 2020-04-30 NOTE — Progress Notes (Addendum)
Johnathan Barker PHYSICAL MEDICINE & REHABILITATION PROGRESS NOTE  Subjective/Complaints: Pt pulled out his NGT overnight. Was replaced by cortrak team this morning. Pt remains very confused, restless.   ROS: Limited due to cognitive/behavioral    Objective: Vital Signs: Blood pressure (!) 153/80, pulse 92, temperature 97.7 F (36.5 C), temperature source Oral, resp. rate 20, height 6' (1.829 m), weight 92.5 kg, SpO2 99 %. DG Chest 2 View  Result Date: 04/30/2020 CLINICAL DATA:  Abnormal chest radiograph EXAM: CHEST - 2 VIEW COMPARISON:  Radiograph 04/28/2020 FINDINGS: Diffuse reticular opacities throughout both lungs are similar to prior with more coalescent opacity in the posterior right lower lobe. Overall extent is similar to the comparison radiograph. No new areas of consolidative airspace disease. Some streaky opacities towards the bases likely reflecting some atelectatic changes well. No effusion or pneumothorax. Stable cardiomediastinal contours. No acute osseous or soft tissue abnormality. Degenerative changes are present in the imaged spine and shoulders. IMPRESSION: Diffuse reticular opacities throughout both lungs are similar to prior with persistent coalescent opacity in the posterior right lower lobe. Overall extent not significantly changed from prior accounting for some shifting atelectatic changes. Electronically Signed   By: Lovena Le M.D.   On: 04/30/2020 06:51   DG Abd 1 View  Result Date: 04/28/2020 CLINICAL DATA:  Follow-up nasogastric tube placement. EXAM: ABDOMEN - 1 VIEW COMPARISON:  April 28, 2020 (7:16 p.m.) FINDINGS: A nasogastric tube is seen with its distal tip noted within the body of the stomach. This is approximately 8 cm distal to the gastroesophageal junction. The bowel gas pattern is normal. Multiple bilateral small renal calculi are noted. IMPRESSION: 1. Nasogastric tube positioning, as described above. Further advancement of the NG tube by approximately 5 cm is  recommended to further decrease the risk of aspiration. 2. Multiple bilateral small renal calculi. Electronically Signed   By: Virgina Norfolk M.D.   On: 04/28/2020 23:40   DG Abd 1 View  Result Date: 04/28/2020 CLINICAL DATA:  NG tube placement EXAM: ABDOMEN - 1 VIEW COMPARISON:  Radiograph 04/28/2020 FINDINGS: Nasogastric tube again seen curling in the lower thoracic esophagus, redirected superiorly. Redemonstration of a single air distended loop of what appears to be colon in the mid to lower abdomen. Is unchanged in appearance from comparison study. Bowel gas pattern is otherwise unremarkable. Calcifications overlie the bilateral renal shadows, predominantly upper pole of the right lower pole on the left. Severe multilevel discogenic change throughout the spine with exuberant osteophyte formation. Additional degenerative changes of the SI joints and both hips. IMPRESSION: 1. Stable appearance of a single air distended loop of what appears to be colon in the mid to lower abdomen. No high-grade obstructive pattern. 2. Nasogastric tube remains curled in the lower thoracic esophagus, redirected superiorly. Recommend repositioning. 3. Bilateral nephrolithiasis. Electronically Signed   By: Lovena Le M.D.   On: 04/28/2020 19:50   DG Abd 1 View  Result Date: 04/28/2020 CLINICAL DATA:  Nasogastric tube placement. EXAM: ABDOMEN - 1 VIEW COMPARISON:  None. FINDINGS: A nasogastric tube is seen. Its distal end is looped within the distal aspect of the esophagus. The bowel gas pattern is normal. Numerous subcentimeter soft tissue calcifications are seen overlying in the expected region of the right kidney. An additional 6 mm soft tissue calcification is seen projecting over the expected region of the lower pole of the left kidney. Multilevel marked severity degenerative changes seen throughout the lumbar spine. IMPRESSION: 1. Nasogastric tube looped within the distal esophagus. Repositioning is  recommended. 2.  Bilateral renal calculi. 3. No evidence of bowel obstruction or ileus. Electronically Signed   By: Virgina Norfolk M.D.   On: 04/28/2020 18:31   Recent Labs    04/28/20 0813  WBC 17.5*  HGB 14.7  HCT 43.4  PLT 310   Recent Labs    04/28/20 0813 04/30/20 0833  NA 136 140  K 4.2 3.9  CL 99 107  CO2 26 21*  GLUCOSE 211* 264*  BUN 13 23  CREATININE 0.96 0.95  CALCIUM 8.6* 8.5*    Physical Exam: BP (!) 153/80   Pulse 92   Temp 97.7 F (36.5 C) (Oral)   Resp 20   Ht 6' (1.829 m)   Wt 92.5 kg   SpO2 99%   BMI 27.66 kg/m   Constitutional: No distress . Vital signs reviewed. HEENT: EOMI, oral membranes moist Neck: supple Cardiovascular: RRR without murmur. No JVD    Respiratory/Chest: scattered rhonchi, inconsistent cooperation on exam.   GI/Abdomen: BS +, non-tender, non-distended Ext: no clubbing, cyanosis, or edema  Skin: Right neck edema, wound CDI Psych: oriented to self only. Very restless and anxious. Could follow simple commands with redirection Musc: No edema in extremities.  No tenderness in extremities. Neurological: Alert  Delayed processing Motor: 4+/5 throughout all 4's  Assessment/Plan: 1. 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  Physiatrist is providing close team supervision and 24 hour management of active medical problems listed below.  Physiatrist and rehab team continue to assess barriers to discharge/monitor patient progress toward functional and medical goals  Care Tool:  Bathing    Body parts bathed by patient: Chest, Abdomen, Front perineal area, Right upper leg, Left upper leg, Face   Body parts bathed by helper: Right arm, Left arm, Buttocks, Right lower leg, Left lower leg     Bathing assist Assist Level: Moderate Assistance - Patient 50 - 74%     Upper Body Dressing/Undressing Upper body dressing    What is the patient wearing?: Pull over shirt    Upper body assist Assist Level: Moderate Assistance - Patient 50 - 74%    Lower Body Dressing/Undressing Lower body dressing      What is the patient wearing?: Incontinence brief     Lower body assist Assist for lower body dressing: Moderate Assistance - Patient 50 - 74%     Toileting Toileting    Toileting assist Assist for toileting: Maximal Assistance - Patient 25 - 49%     Transfers Chair/bed transfer  Transfers assist     Chair/bed transfer assist level: Moderate Assistance - Patient 50 - 74%     Locomotion Ambulation   Ambulation assist      Assist level: Moderate Assistance - Patient 50 - 74% Assistive device: Walker-rolling Max distance: 5'   Walk 10 feet activity   Assist  Walk 10 feet activity did not occur: Safety/medical concerns        Walk 50 feet activity   Assist Walk 50 feet with 2 turns activity did not occur: Safety/medical concerns         Walk 150 feet activity   Assist Walk 150 feet activity did not occur: Safety/medical concerns         Walk 10 feet on uneven surface  activity   Assist Walk 10 feet on uneven surfaces activity did not occur: Safety/medical concerns  Wheelchair     Assist Will patient use wheelchair at discharge?: (TBD)   Wheelchair activity did not occur: Safety/medical concerns         Wheelchair 50 feet with 2 turns activity    Assist    Wheelchair 50 feet with 2 turns activity did not occur: Safety/medical concerns       Wheelchair 150 feet activity     Assist Wheelchair 150 feet activity did not occur: Safety/medical concerns          Medical Problem List and Plan: 1.  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  Continue CIR  Pt remains quite confused  IV Steroids started on 6/5 2.   Antithrombotics: -DVT/anticoagulation:  Mechanical: Sequential compression devices, below knee Bilateral lower extremities             -antiplatelet therapy: N/A 3. Pain Management: Tylenol prn. Does not like Oxycodone.  Ordered ultram prn  ?controlled 6/7  Monitor with increased exertion as well as cognition side effects 4. Mood: LCSW to follow for evaluation and support.              -antipsychotic agents: N/A 5. Neuropsych: This patient is not fully capable of making decisions on his own behalf. 6. Skin/Wound Care: Monitor wound for healing.  7. Fluids/Electrolytes/Nutrition: Encourage fluid intake. Monitor I/O.  8. HTN: Monitor BP -continue amlodipine, Irbesartan, and metoprolol.   Mildly elevated on 6/7  Monitor with increased mobility 9.  Steroid-induced hyperglycemia on T2DM with hyperglycemia: Hgb A1c- 7.5. Monitor BS ac/hs. Used amaryl 4 mg bid and lantus 2 units HS.   Amaryl on hold at present   Continue to taper lantus to home dose.   Elevated on 6/7,solumedrol decreased to QD today, cover with SSI for now as steroids are being tapered  Monitor with increased mobility 10. Leucocytosis: Likely secondary to steroids.  Monitor for fever or other signs of infection.              WBC 17.5 on 6/5, labs pending for 6/7  Afebrile  Continue to monitor 11. Constipation:              Adjust bowel meds as necessary 12. Hyperbilirubinemia:   Elevated on 6/5, continue to monitor 13. Pharyngeal dysphagia: ST to follow for evaluation.   N.p.o. with NG at present  IV fluids initiated. Keep continuous given frequency that NGT being pulled out.  Consult RD for initiation of TF  Advance diet as tolerated 14.  Urinary retention             PVRs with retention, appears to be generally emptying 15.  Sepsis/aspiration pneumonia, associated delirium  -bcx + x 2 for staph hominis, pharmacy spoke with Dr. Posey Pronto yesterday  -Initial CXR's have demonstrated right lower lobe consolidation.   -I  personally reviewed today's chest x-ray 6/7 which continues to demonstrate RLL consolidation  -plan is  To continue IV cefepime initiated along with IV Flagyl for now  -recheck cbc today  - supportive care for behavior/safety    LOS: 3 days A FACE TO FACE EVALUATION WAS PERFORMED  Meredith Staggers 04/30/2020, 11:09 AM

## 2020-04-30 NOTE — Procedures (Signed)
Cortrak  Person Inserting Tube:  Jacklynn Barnacle E, RD Tube Type:  Cortrak - 43 inches Tube Location:  Left nare Initial Placement:  Stomach Secured by: Bridle Technique Used to Measure Tube Placement:  Documented cm marking at nare/ corner of mouth Cortrak Secured At:  71 cm    Cortrak Tube Team Note:  Cortrak feeding tube was placed in patient earlier today. Received page from RN at 1551 that patient had pulled out Cortrak feeding tube. Replaced with new Cortrak feeding tube as previous tube had been discarded.  No x-ray is required. RN may begin using tube.   If the tube becomes dislodged please keep the tube and contact the Cortrak team at [email or web address] (password [email or web address]) for replacement.  If after hours and replacement cannot be delayed, place a NG tube and confirm placement with an abdominal x-ray.   Jacklynn Barnacle, MS, RD, LDN Pager number available on Amion

## 2020-05-01 ENCOUNTER — Encounter (HOSPITAL_COMMUNITY): Payer: Medicare Other | Admitting: Speech Pathology

## 2020-05-01 ENCOUNTER — Inpatient Hospital Stay (HOSPITAL_COMMUNITY): Payer: Medicare Other

## 2020-05-01 ENCOUNTER — Inpatient Hospital Stay (HOSPITAL_COMMUNITY): Payer: Medicare Other | Admitting: Occupational Therapy

## 2020-05-01 DIAGNOSIS — R7881 Bacteremia: Secondary | ICD-10-CM

## 2020-05-01 LAB — CULTURE, BLOOD (ROUTINE X 2)
Special Requests: ADEQUATE
Special Requests: ADEQUATE

## 2020-05-01 LAB — CBC WITH DIFFERENTIAL/PLATELET
Abs Immature Granulocytes: 0.06 10*3/uL (ref 0.00–0.07)
Basophils Absolute: 0 10*3/uL (ref 0.0–0.1)
Basophils Relative: 0 %
Eosinophils Absolute: 0 10*3/uL (ref 0.0–0.5)
Eosinophils Relative: 0 %
HCT: 42.8 % (ref 39.0–52.0)
Hemoglobin: 14.4 g/dL (ref 13.0–17.0)
Immature Granulocytes: 0 %
Lymphocytes Relative: 13 %
Lymphs Abs: 2.4 10*3/uL (ref 0.7–4.0)
MCH: 30.4 pg (ref 26.0–34.0)
MCHC: 33.6 g/dL (ref 30.0–36.0)
MCV: 90.5 fL (ref 80.0–100.0)
Monocytes Absolute: 1.6 10*3/uL — ABNORMAL HIGH (ref 0.1–1.0)
Monocytes Relative: 9 %
Neutro Abs: 13.6 10*3/uL — ABNORMAL HIGH (ref 1.7–7.7)
Neutrophils Relative %: 78 %
Platelets: 321 10*3/uL (ref 150–400)
RBC: 4.73 MIL/uL (ref 4.22–5.81)
RDW: 12.9 % (ref 11.5–15.5)
WBC: 17.6 10*3/uL — ABNORMAL HIGH (ref 4.0–10.5)
nRBC: 0 % (ref 0.0–0.2)

## 2020-05-01 LAB — BASIC METABOLIC PANEL
Anion gap: 12 (ref 5–15)
BUN: 28 mg/dL — ABNORMAL HIGH (ref 8–23)
CO2: 22 mmol/L (ref 22–32)
Calcium: 8.1 mg/dL — ABNORMAL LOW (ref 8.9–10.3)
Chloride: 109 mmol/L (ref 98–111)
Creatinine, Ser: 0.82 mg/dL (ref 0.61–1.24)
GFR calc Af Amer: >60 mL/min (ref >60)
GFR calc non Af Amer: >60 mL/min (ref >60)
Glucose, Bld: 293 mg/dL — ABNORMAL HIGH (ref 70–99)
Potassium: 4.1 mmol/L (ref 3.5–5.1)
Sodium: 143 mmol/L (ref 135–145)

## 2020-05-01 LAB — GLUCOSE, CAPILLARY
Glucose-Capillary: 281 mg/dL — ABNORMAL HIGH (ref 70–99)
Glucose-Capillary: 283 mg/dL — ABNORMAL HIGH (ref 70–99)
Glucose-Capillary: 328 mg/dL — ABNORMAL HIGH (ref 70–99)
Glucose-Capillary: 288 mg/dL — ABNORMAL HIGH (ref 70–99)
Glucose-Capillary: 236 mg/dL — ABNORMAL HIGH (ref 70–99)

## 2020-05-01 LAB — ECHOCARDIOGRAM COMPLETE
Height: 72 in
Weight: 3262.81 oz

## 2020-05-01 MED ORDER — INSULIN ASPART 100 UNIT/ML ~~LOC~~ SOLN
5.0000 [IU] | Freq: Three times a day (TID) | SUBCUTANEOUS | Status: DC
  Administered 2020-05-01 – 2020-05-04 (×12): 5 [IU] via SUBCUTANEOUS

## 2020-05-01 NOTE — Progress Notes (Signed)
[facility] PHYSICAL MEDICINE & REHABILITATION PROGRESS NOTE  Subjective/Complaints: Patient seen sitting up in bed this morning.  He states he slept well overnight.  Per nursing, patient slept fairly well with episode of urinary incontinence and pain.  Discussed with therapies plans for MBS.   ROS: Limited due to cognition, but appears to deny CP, shortness of breath, nausea, vomiting, diarrhea.  Objective: Vital Signs: Blood pressure (!) 153/85, pulse 79, temperature 98.7 F (37.1 C), temperature source Oral, resp. rate (!) 22, height 6' (1.829 m), weight 92.5 kg, SpO2 100 %. DG Chest 2 View  Result Date: 04/30/2020 CLINICAL DATA:  Abnormal chest radiograph EXAM: CHEST - 2 VIEW COMPARISON:  Radiograph 04/28/2020 FINDINGS: Diffuse reticular opacities throughout both lungs are similar to prior with more coalescent opacity in the posterior right lower lobe. Overall extent is similar to the comparison radiograph. No new areas of consolidative airspace disease. Some streaky opacities towards the bases likely reflecting some atelectatic changes well. No effusion or pneumothorax. Stable cardiomediastinal contours. No acute osseous or soft tissue abnormality. Degenerative changes are present in the imaged spine and shoulders. IMPRESSION: Diffuse reticular opacities throughout both lungs are similar to prior with persistent coalescent opacity in the posterior right lower lobe. Overall extent not significantly changed from prior accounting for some shifting atelectatic changes. Electronically Signed   By: Lovena Le M.D.   On: 04/30/2020 06:51   Recent Labs    04/30/20 1151 05/01/20 0540  WBC 18.7* 17.6*  HGB 15.0 14.4  HCT 44.4 42.8  PLT 400 321   Recent Labs    04/30/20 0833  NA 140  K 3.9  CL 107  CO2 21*  GLUCOSE 264*  BUN 23  CREATININE 0.95  CALCIUM 8.5*    Physical Exam: BP (!) 153/85 (BP Location: Right Arm)   Pulse 79   Temp 98.7 F (37.1 C) (Oral)   Resp (!) 22   Ht 6'  (1.829 m)   Wt 92.5 kg   SpO2 100%   BMI 27.66 kg/m   Constitutional: No distress . Vital signs reviewed. HENT: Normocephalic.  Atraumatic. Eyes: EOMI. No discharge. Cardiovascular: No JVD.  RRR. Respiratory: Normal effort.  No stridor.  Clear to auscultation. GI: Non-distended. Skin: Right neck wound with decreasing edema, C/D/I Psych: Some delay and confusion Musc: No edema in extremities.  No tenderness in extremities. Neurological: Alert and oriented x4 with increased time Delayed processing Motor: 4+-5/5 throughout   Assessment/Plan: 1. 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  Physiatrist is providing close team supervision and 24 hour management of active medical problems listed below.  Physiatrist and rehab team continue to assess barriers to discharge/monitor patient progress toward functional and medical goals  Care Tool:  Bathing    Body parts bathed by patient: Chest, Abdomen, Front perineal area, Right upper leg, Left upper leg, Face   Body parts bathed by helper: Right arm, Left arm, Buttocks, Right lower leg, Left lower leg     Bathing assist Assist Level: Moderate Assistance - Patient 50 - 74%     Upper Body Dressing/Undressing Upper body dressing   What is the patient wearing?: Pull over shirt    Upper body assist Assist Level: Moderate Assistance - Patient 50 - 74%    Lower Body Dressing/Undressing Lower body dressing      What is the patient wearing?: Incontinence brief     Lower  body assist Assist for lower body dressing: Moderate Assistance - Patient 50 - 74%     Toileting Toileting    Toileting assist Assist for toileting: Maximal Assistance - Patient 25 - 49%     Transfers Chair/bed transfer  Transfers assist     Chair/bed transfer assist level: Moderate Assistance - Patient 50 - 74%      Locomotion Ambulation   Ambulation assist      Assist level: Moderate Assistance - Patient 50 - 74% Assistive device: Walker-rolling Max distance: 5'   Walk 10 feet activity   Assist  Walk 10 feet activity did not occur: Safety/medical concerns        Walk 50 feet activity   Assist Walk 50 feet with 2 turns activity did not occur: Safety/medical concerns         Walk 150 feet activity   Assist Walk 150 feet activity did not occur: Safety/medical concerns         Walk 10 feet on uneven surface  activity   Assist Walk 10 feet on uneven surfaces activity did not occur: Safety/medical concerns         Wheelchair     Assist Will patient use wheelchair at discharge?: (TBD)   Wheelchair activity did not occur: Safety/medical concerns         Wheelchair 50 feet with 2 turns activity    Assist    Wheelchair 50 feet with 2 turns activity did not occur: Safety/medical concerns       Wheelchair 150 feet activity     Assist Wheelchair 150 feet activity did not occur: Safety/medical concerns          Medical Problem List and Plan: 1.  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  Continue CIR  IV Steroids started on 6/5, plan to transition tomorrow 2.  Antithrombotics: -DVT/anticoagulation:  Mechanical: Sequential compression devices, below knee Bilateral lower extremities             -antiplatelet therapy: N/A 3. Pain Management: Tylenol prn. Does not like Oxycodone.  Ordered ultram prn  Appears controlled on 6/8  Monitor with increased exertion as well as cognition side effects 4. Mood: LCSW to follow for evaluation and support.              -antipsychotic agents: N/A 5. Neuropsych: This patient is not fully capable of making decisions on his own behalf. 6. Skin/Wound Care: Monitor wound for healing.  7.  Fluids/Electrolytes/Nutrition: Encourage fluid intake. Monitor I/O.  8. HTN: Monitor BP -continue amlodipine, Irbesartan, and metoprolol.   Elevated on 6/8, monitor after weaning of steroids  Monitor with increased mobility 9.  Steroid-induced hyperglycemia on T2DM with hyperglycemia: Hgb A1c- 7.5. Monitor BS ac/hs. Used amaryl 4 mg bid and lantus 2 units HS.   Amaryl on hold at present   Continue to taper lantus to home dose.   Elevated on 6/8, will consider medication adjustment if persistently elevated off steroids  Monitor with increased mobility 10. Leucocytosis: Likely secondary to steroids.  Monitor for fever or other signs of infection.              WBC 17.6 on 6/8  Afebrile  Continue to monitor 11. Constipation:              Adjust bowel meds as necessary 12. Hyperbilirubinemia:   Elevated on 6/5, continue to monitor 13. Pharyngeal dysphagia: ST to follow for evaluation.  N.p.o. with NG at present  Continue tube feeds  DC IVF  Advance diet as tolerated  Plan for MBS today 14.  Urinary retention             ?  Improving with 15.  Sepsis/aspiration pneumonia, associated delirium  Bcx + x 2 for staph hominis, discussed with pharmacy, continue antibiotics  Initial CXR's have demonstrated right lower lobe consolidation, repeat chest x-ray personally reviewed, showing same  Continue IV cefepime initiated along with IV Flagyl for now   LOS: 4 days A FACE TO FACE EVALUATION WAS PERFORMED  Jakolby Sedivy Lorie Phenix 05/01/2020, 8:45 AM

## 2020-05-01 NOTE — Progress Notes (Addendum)
Occupational Therapy Session Note  Patient Details  Name: Johnathan Barker MRN: [id number] Date of Birth: 04-Nov-1941  Today's Date: 05/01/2020 OT Individual Time: 4193-7902 OT Individual Time Calculation (min): 63 min    Short Term Goals: Week 1:  OT Short Term Goal 1 (Week 1): Pt will complete toilet or BSC transfer with 1 assist without Stedy OT Short Term Goal 2 (Week 1): Pt will complete LB dressing sit<stand with Min balance assist OT Short Term Goal 3 (Week 1): Pt will complete one grooming task while standing or in supported standing position near the sink to improve balance  Skilled Therapeutic Interventions/Progress Updates:    Treatment session with focus on functional transfers, sit > stand, standing tolerance, and sequencing during functional mobility and self-care tasks.  Pt received semi-reclined in bed with wrist restraints on to maintain NG tube and IV placement.  Removed restraints to allow for participation in treatment session.  Completed bed mobility with min assist and min cues for sequencing of transfer.  Stand pivot transfer bed > w/c with mod assist and multimodal cues for safety and awareness during transfer due to instability in BLE.  Engaged in bathing and dressing at sink with setup of items and min cues for sequencing during UB bathing.  Therapist monitoring NG tube throughout to ensure proper placement and allow for UB dressing without effecting NG tube.  Pt donned shirts with setup and min assist when pulling shirt over head d/t NG tube.  Engaged in sit > stand from therapy mat with use of mirror for visual feedback.  Therapist providing mod faded to min assist for upright standing posture during table top task.  Therapist blocking at [place] knee to provide additional feedback for improved weight bearing in standing as pt with decreased proprioception in LLE in standing.  Engaged in simple pattern replication in standing with colored blocks increasing challenge with focus on  awareness of errors and sequencing throughout task.  Returned to w/c min assist squat pivot with improved sequencing once therapist positioned hand on arm rest, then pt able to sequence weight shift with min assist.  Returned to bed at end of session with min assist squat pivot.  Pt returned to semi-reclined in bed and therapist reapplied wrist restraints for safety.  [name] present in room, observing self-care and mobility.    Therapy Documentation Precautions:  Precautions Precautions: Cervical, Back, Fall Precaution Comments: no brace needed Restrictions Weight Bearing Restrictions: No General:   Vital Signs:  Pain: Pain Assessment Pain Scale: Faces Pain Score: 0-No pain   Therapy/Group: Individual Therapy  [name] 05/01/2020, 11:50 AM

## 2020-05-01 NOTE — Progress Notes (Signed)
Speech Language Pathology Daily Session Note  Patient Details  Name: Johnathan Barker MRN: [id number] Date of Birth: 1941-02-05  Today's Date: 05/01/2020 SLP Individual Time: 1303-1350 SLP Individual Time Calculation (min): 47 min  Short Term Goals: Week 1: SLP Short Term Goal 1 (Week 1): Pt will consume therapeutic trials of ice chips and/or puree textures with minimal overt s/sx aspiration and Min A for use of swallow strategies. SLP Short Term Goal 2 (Week 1): Pt will participate in instrumental swallow study in order to assess pharyngeal swallow function. SLP Short Term Goal 3 (Week 1): Pt will sustain attention to functional tasks for 5 minute intervals with Mod a cues for redirection. SLP Short Term Goal 4 (Week 1): Pt will demonstrate carryover and recall of new and/or daily information with Mod A for use of memory strateiges/aids. SLP Short Term Goal 5 (Week 1): Pt will use strategies to compensate for wet vocal quality (throat clear, increased vocal intensity) to increase speech intelligibility to 85% at the sentence level.  Skilled Therapeutic Interventions: Skilled ST services focused on education and swallow skills. SLP provided education to pt, pt's wife and pt's son, pertaining to swallow function observed during today's MBS and rational for continuing NPO status with initiation of water protocol. SLP utilized images from [facility] to explain how edema has impacted pt' swallow function and need for carryover of swallow strategies when participating in water protocol. All questions were answered to satisfaction. SLP preformed oral care with suction toothbrush, piror to thin trials via cup. Pt demonstrated x1 large immediate cough when consuming uncontrolled larger cup sips and then x 1 smaller immediate cough with controled cups sips of 5cc out 5 trials. Pt required max A verbal cues to utilize swallow strategies of immediate throat clear and dry swallow. Pt continued to demonstrate  intermittent language of confusion and tangentail speech. Pt's family supports some cognitive decline prior to surgery, but it was unclear of the extent. [name] and son stated they were in the progress of taking over money management responsibilities. SLP will continue to investigate cognitive skills and carryover of novel swallow strategies during trials of thin liquids. SLP posted swallow strategy sign in room. Pt was left with bed alarms set and family in room. SLP recommends to continued skilled [place] services.     Pain Pain Assessment Faces Pain Scale: No hurt  Therapy/Group: Individual Therapy  Lillyonna Armstead  [facility] 05/01/2020, 4:55 PM

## 2020-05-01 NOTE — Progress Notes (Signed)
Echocardiogram 2D Echocardiogram has been performed.  Oneal Deputy Nadine Ryle 05/01/2020, 2:35 PM

## 2020-05-01 NOTE — Progress Notes (Signed)
Physical Therapy Session Note  Patient Details  Name: Johnathan Barker MRN: [id number] Date of Birth: October 19, 1941  Today's Date: 05/01/2020 PT Individual Time: 0800-0855 PT Individual Time Calculation (min): 55 min   Short Term Goals: Week 1:  PT Short Term Goal 1 (Week 1): Pt will complete least restrictive transfer with min A consistently PT Short Term Goal 2 (Week 1): Pt will ambulate x 50 ft with LRAD and mod A PT Short Term Goal 3 (Week 1): Pt will maintain dynamic standing balance with min A x 5 min  Skilled Therapeutic Interventions/Progress Updates:   Received pt supine in bed, pt agreeable to therapy, and denied any pain during session. RN present administering medications. Session with emphasis on functional mobility/transfers, toileting, generalized strengthening, dynamic standing balance/coordination, and improved activity tolerance. Doffed bilateral wrist restraints with total A. Pt donned pants supine in bed with mod A and transferred supine<>sitting EOB with supervision and use of bedrails. Donned shoes with max A sitting EOB. Pt transferred stand<>pivot bed<>WC without AD mod A with cues for hand placement, stepping sequence, and turning technique. Pt reported urge to urinate and transferred WC<>toilet with bedside commode over top with mod A using grab bars. Pt required max A to doff brief/pants. Pt able to void and transferred stand<>pivot toilet<>WC using grab bars with mod A and max A to pull pants/brief over hips. Pt randomly beginning to lift R LE from floor while standing and required max cues to maintain R LE flat on floor while therapist pulled up pants. Pt washed hands seated in WC at sink with supervision and transported to dayroom in [facility] total assist for time management purposes. Pt with increased confusion and verbose throughout session discussing plans for his funeral arrangements in [place]; required mod cues for redirection and attention to task. Pt transferred  stand<>pivot WC<>Nustep without AD mod A with cues for hand placement and technique and performed bilateral UE/LE strengthening on Nustep at workload 5 for 10 minutes. Pt with LE spasms/ataxia on Nustep resulting in sudden jerking of LEs off of the Nustep and shoe coming off requiring max A to don again. Pt reported he gets these spasms in his legs that have caused him to lose his balance previously. Pt transferred Nustep<>WC via stand<>pivot mod A and transported back to room in [facility] total assist. Pt transferred WC<>bed via stand<>pivot mod A and sit<>supine with supervision. Doffed shoes and donned bilateral wrist restraints. Concluded session with pt supine in bed, needs within reach, and bed alarm on.   Therapy Documentation Precautions:  Precautions Precautions: Cervical, Back, Fall Precaution Comments: no brace needed Restrictions Weight Bearing Restrictions: No  Therapy/Group: Individual Therapy [name] PT, DPT   05/01/2020, 7:31 AM

## 2020-05-01 NOTE — Progress Notes (Signed)
Modified Barium Swallow Progress Note  Patient Details  Name: Johnathan Barker MRN: [id number] Date of Birth: 12/21/1940  Today's Date: 05/01/2020  Modified Barium Swallow completed.  Full report located under Chart Review in the Imaging Section.  Brief recommendations include the following:  Clinical Impression      Pt demonstrate severe-moderate pharyngeal dysphagia. Pt's pharyngeal edema as well as the reduced pharyngeal space from [place], resulted in decrease epiglottic inversion impacting laryngeal vestibule closure and decrease pharyngeal wall contraction leading to residue in the vallecula and pyriform sinuses. Residue in the vallecula and pyriform sinuses entered the laryngeal vestibule before and during the swallow with trials of thin liquids, nectar thick liquids and puree. The consumption of thin liquids via TSP, cup and straw resulted in deep penetration (PAS 2 and 3) in which majority was sensed and cleared with cued throat clear/cough. Trials of nectar thick liquids more often resulted in shallow penetration (PAS 2 and 3), but when utilized as a liquid wash following puree trials, lead to increase pharyngeal residue with aspiration noted before and during the swallow (PAS 7.) Oral phase on assessed textures were characterized by intermittent piecemeal deglutition and oral holding due to bolus inattention and cognitive impairments. SLP recommends to continue nutrition via [place] and initiate water protocol with use of compensatory strategies (throat clear and multiple swallows) to reduce pharyngeal residue. Pt will likely tolerate clear liquid diet as edema continues to improve and small trials of puree at bedside, but will require repeat MBSS to advance diet.    Swallow Evaluation Recommendations   SLP Diet Recommendations: NPO   Compensations: Clear throat after each swallow;Multiple dry swallows after each bite/sip(water protocol only)   Oral Care Recommendations: Oral care  BID   Other Recommendations: Remove water pitcher;Have oral suction available    Clotilda Hafer 05/01/2020,4:53 PM

## 2020-05-01 NOTE — Progress Notes (Signed)
@   1945, large thick red sputum noted on patient's shirt. Sputum rest of night yellow/green. Congested cough with wet voice. Oral care provided several times during night. Restless from 1900-0100. Patient awake past 2 nights. PRN trazodone 25mg 's given at 2046. Slept from 0100-0600.  PRN tylenol given at 2245. At 2335 grimacing, C/O leg pain, removed SCD's, PRN ultram given. Bilateral wrist restraints in place, to prevent pulling at NSL and coretrak. More alert and coherent this AM. Tearful this AM. Continent of urine X 2 and incontinent x 1. Bladder scan=208cc's. Patrici Ranks A

## 2020-05-02 ENCOUNTER — Inpatient Hospital Stay (HOSPITAL_COMMUNITY): Payer: Medicare Other

## 2020-05-02 ENCOUNTER — Inpatient Hospital Stay (HOSPITAL_COMMUNITY): Payer: Medicare Other | Admitting: Occupational Therapy

## 2020-05-02 LAB — GLUCOSE, CAPILLARY
Glucose-Capillary: 232 mg/dL — ABNORMAL HIGH (ref 70–99)
Glucose-Capillary: 165 mg/dL — ABNORMAL HIGH (ref 70–99)
Glucose-Capillary: 169 mg/dL — ABNORMAL HIGH (ref 70–99)
Glucose-Capillary: 141 mg/dL — ABNORMAL HIGH (ref 70–99)

## 2020-05-02 MED ORDER — DEXAMETHASONE 4 MG PO TABS: 4.0000 mg | ORAL_TABLET | Freq: Three times a day (TID) | ORAL | Status: DC

## 2020-05-02 MED ORDER — DEXAMETHASONE 2 MG PO TABS: 2.0000 mg | ORAL_TABLET | Freq: Three times a day (TID) | ORAL | Status: DC

## 2020-05-02 MED ORDER — DEXAMETHASONE 2 MG PO TABS: 2.0000 mg | ORAL_TABLET | Freq: Two times a day (BID) | ORAL | Status: DC

## 2020-05-02 NOTE — Patient Care Conference (Signed)
Inpatient RehabilitationTeam Conference and Plan of Care Update Date: 05/02/2020   Time: 3:20 PM    Patient Name: Johnathan Barker      Medical Record Number: [id number]  Date of Birth: December 27, 1940 Sex: Male         Room/Bed: 4W18C/4W18C-01 Payor Info: Payor: MEDICARE / Plan: MEDICARE PART A AND B / Product Type: *No Product type* /    Admit Date/Time:  04/27/2020  6:34 PM  Primary Diagnosis:  Myelopathy ([place])  Patient Active Problem List   Diagnosis Date Noted  . Sepsis without acute organ dysfunction ([place])   . Aspiration pneumonia of right lower lobe ([place])   . Steroid-induced hyperglycemia   . Postoperative pain   . Urinary retention   . Dysphagia   . Constipation   . Type 2 diabetes mellitus with hyperglycemia, with long-term current use of insulin ([place])   . Benign essential HTN   . Post-operative pain   . Abnormal chest x-ray   . FUO (fever of unknown origin)   . 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 Controlled type 2 diabetes mellitus with hyperglycemia, with long-term current use of insulin ([place])   . Drug induced constipation   . Leucocytosis   . Hyperbilirubinemia   . Myelopathy ([place]) 04/26/2020  . Weakness of right lower extremity 04/24/2020  . Essential hypertension 04/24/2020  . Diabetes mellitus type 2 in nonobese ([place]) 04/24/2020  . Cord compression ([place]) 04/24/2020  . 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 Delice Lesch Care Coodinator Present: Erlene Quan, BSW;Joyclyn Plazola Tobin Chad, RN, BSN, CRRN Nurse Present: Judee Clara, LPN PT Present: Deniece Ree, PT OT Present: [name], OT SLP Present: Weston Anna, SLP PPS Coordinator present : Ileana Ladd, PT     Current Status/Progress Goal Weekly Team Focus  Bowel/Bladder   Pt incontient to B&B, I&O q6, PVR and bladder scan q4-6  Assess QS/ PRN, pt using the urinal       Swallow/Nutrition/ Hydration   NPO, [place]. water protocol  Supervision A  water protocol, carryover of swallow strategies, possible upgrade to clear liquid diet and trials of puree   ADL's   Min Assist UB bathing and dressing, Mod Assist LB bathing and Max Assist LB dressing, Mod assist stand pivot transfers, Min Assist squat pivot transfers  Pt with fluctuating levels of confusion and decreased proprioception/awareness of positioning of LLE  Supervision  ADL retraining, functional transfers, awareness of deficits, dynamic standing balance   Mobility   bed mobility min A, transfers mod A with/without RW, gait 61ft with RW mod A  supervision overall, min A stairs  functional mobility/transfers, generalized strengthening, dynamic standing balance/coordination, ambulation, and endurance   Communication   conversation 80% due to fast rate and wet vocal quality  Supervison A  speech intelligibility and awareness of vocal quality   Safety/Cognition/ Behavioral Observations  max-mod A, intermittent language of confusion, preseverative. family supports some cognitive decline prior to admission  Supervision A - may downgrade after today  basic problem solving, recall with aid, sustained attention and awareness   Pain   Pt states pain is 0/10 using pain scale  Maintain pain level at 0/10      Skin   +1 edema R/L lower extremities, abrasion on right elbow  Maintain skin intergrity and no skin breakdown         *See Care Plan and progress notes for long  and short-term goals.     Barriers to Discharge  Current Status/Progress Possible Resolutions Date Resolved   Nursing                  PT  Medical stability;Incontinence;Behavior  impulsive, recently pulling out NG tube, and confused              OT Medical stability;Nutrition means  impulsive, fluctuating levels of cognition             SLP                Care Coordinator Medical stability              Discharge Planning/Teaching  Needs:    Medication administration, nutritional means, transfers, toileting, etc     Team Discussion:  Slow progression; function impaired by complications with aspiration pneumonia. Continue to note lack of awareness of location of legs, ataxia- scissoring gait; issues with sequencing. Swallowing affected by edema in neck. Staff note family reported patient had prior cognitive issues. Continue NPO and TF to meet nutritional needs.  Revisions to Treatment Plan:  Monitor effects of steroid administration - changing to po form 05/03/20. MBS pending. Recommend HH follow up services    Medical Summary Current Status: Deficits with mobility, swallowing, transfers, cognition, self-care secondary to myelopathy s/p decompression of cervical and thoracic spine Weekly Focus/Goal: Improve mobility, HTN, hyerglycemia, leukocytosis, dyspahgia, aspiration pna  Barriers to Discharge: Behavior;Incontinence;IV antibiotics;Medical stability;Nutrition means   Possible Resolutions to Barriers: Therapies, advance diet as tolerated, wean steroids, follow labs, optimize DM/BP meds   Continued Need for Acute Rehabilitation Level of Care: The patient requires daily medical management by a physician with specialized training in physical medicine and rehabilitation for the following reasons: Direction of a multidisciplinary physical rehabilitation program to maximize functional independence : Yes Medical management of patient stability for increased activity during participation in an intensive rehabilitation regime.: Yes Analysis of laboratory values and/or radiology reports with any subsequent need for medication adjustment and/or medical intervention. : Yes   I attest that I was present, lead the team conference, and concur with the assessment and plan of the team.   Dorien Chihuahua B 05/02/2020, 3:20 PM

## 2020-05-02 NOTE — Progress Notes (Signed)
Speech Language Pathology Daily Session Note  Patient Details  Name: AMY BELLOSO MRN: [id number] Date of Birth: November 16, 1941  Today's Date: 05/02/2020 SLP Individual Time: 1033-1140 SLP Individual Time Calculation (min): 67 min  Short Term Goals: Week 1: SLP Short Term Goal 1 (Week 1): Pt will consume therapeutic trials of ice chips and/or puree textures with minimal overt s/sx aspiration and Min A for use of swallow strategies. SLP Short Term Goal 2 (Week 1): Pt will participate in instrumental swallow study in order to assess pharyngeal swallow function. SLP Short Term Goal 2 - Progress (Week 1): Met SLP Short Term Goal 3 (Week 1): Pt will sustain attention to functional tasks for 5 minute intervals with Mod a cues for redirection. SLP Short Term Goal 4 (Week 1): Pt will demonstrate carryover and recall of new and/or daily information with Mod A for use of memory strateiges/aids. SLP Short Term Goal 5 (Week 1): Pt will use strategies to compensate for wet vocal quality (throat clear, increased vocal intensity) to increase speech intelligibility to 85% at the sentence level.  Skilled Therapeutic Interventions: Skilled ST services focused on education, swallow and cognitive skills. Pt was oriented to situation, place and time except for day of the week. Pt demonstrated recall of swallow strategies (throat clear and dry swallow) posted in room. Pt demonstrated basic problem solving brushing teeth with supervision A verbal cues prior to PO intake trials of thin via straw. Pt consumed 6oz of thin liquids via straw throughout the session, demonstrating x3 immediate coughs in which pt verbal confirmed sensation. Pt required max A verbal cues to properly utilize swallow strategies and mod A verbal cues to recall swallow strategies. SLP continued to investigate cognitive skills utilizing ALFA money management ( displaying, adding and subtracting change) pt required mod A fade to supervision A verbal cues  for sustained attention, problem solving and error awareness. Pt demonstrated ability to self-correct errors x2. Pt completed mildly complex medication management task ALFA, pt required max A verbal cues for sustained attention (internally distracted, tangential speech), problem solving and error awareness. Pt continues to demonstrate islands of clarity, with periods of language of confusion and tangential speech. Pt was left in room with nursing staff. SLP communicated with pt's wife outside the room and provided education about changes in cognitive skills and need to carryover swallow strategies. 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 ST recommends to continue skilled ST services.      Pain Pain Assessment Pain Score: 0-No pain  Therapy/Group: Individual Therapy  Jaiveon Suppes  [facility] 05/02/2020, 11:51 AM

## 2020-05-02 NOTE — Progress Notes (Signed)
Patient was able to be free from wrist restraints for most of the day due to family at bedside. When family left, wrist restraints applied and patient aware and also family. No signs of injury noted at this time, no complications noted. Continue plan of care. Audie Clear, LPN

## 2020-05-02 NOTE — Progress Notes (Signed)
Patient ID: Johnathan Barker, male   DOB: 1941-02-05, 79 y.o.   MRN: [id number]  Team Conference Report to Patient/Family  Team Conference discussion was reviewed with the patient and caregiver, including goals, any changes in plan of care and target discharge date.  Patient and caregiver express understanding and are in agreement.  The patient has a target discharge date of 05/15/20.  Dyanne Iha 05/02/2020, 1:49 PM

## 2020-05-02 NOTE — Progress Notes (Signed)
Occupational Therapy Session Note  Patient Details  Name: Johnathan Barker MRN: [id number] Date of Birth: 04/19/1941  Today's Date: 05/02/2020 OT Individual Time: 4193-7902 OT Individual Time Calculation (min): 54 min    Short Term Goals: Week 1:  OT Short Term Goal 1 (Week 1): Pt will complete toilet or BSC transfer with 1 assist without Stedy OT Short Term Goal 2 (Week 1): Pt will complete LB dressing sit<stand with Min balance assist OT Short Term Goal 3 (Week 1): Pt will complete one grooming task while standing or in supported standing position near the sink to improve balance  Skilled Therapeutic Interventions/Progress Updates:    Treatment session with focus on functional transfers, sit > stand, standing balance, and sequencing during therapeutic activities.  Pt received supine in bed with wrist restraints on to maintain NG tube intact, therapist removed prior to getting OOB.  Pt completed bed mobility with supervision to come to sitting at EOB.  Completed squat pivot transfer bed > w/c with min assist with cues for hand placement to increase sequencing of transfer.  Engaged in grooming tasks with pt washing face and brushing hair with setup of items.  Engaged in therapeutic activity in sitting and standing at table with focus on sequencing and awareness.  Pt completed 50% of peg board pattern replication without any errors, however when completing remaining 50% (Lt to Rt diagonal) pt demonstrating increased errors.  Pt required max question cues for sequencing and awareness of errors, pt able to correct pattern once identified primary error.  Mod assist sit > stand at table due to decreased sequencing and weight shift.  Once upright, pt constantly weight shifting bringing BOS too close requiring cues to correct.  Pt continues to demonstrate decreased proprioception in LLE > RLE impacting standing balance.    Pt very confused at beginning of session in regards to his hospital telephone wiping  out all his phone numbers and his wife no longer having a phone number.  Attempted to reorient and redirect.  Pt more redirectable once exited room to therapy gym.  Upon return, pt's wife in room.  Discussed confusion this AM with pt's wife reassuring him that she has his phone and everything is all right.  Pt remained upright in w/c with seat belt alarm on and passed off to SLP.  Therapy Documentation Precautions:  Precautions Precautions: Cervical, Back, Fall Precaution Comments: no brace needed Restrictions Weight Bearing Restrictions: No Pain:  Pt with no c/o pain   Therapy/Group: Individual Therapy  [name] 05/02/2020, 10:59 AM

## 2020-05-02 NOTE — Progress Notes (Signed)
Physical Therapy Session Note  Patient Details  Name: Johnathan Barker MRN: [id number] Date of Birth: 1941-11-24  Today's Date: 05/02/2020 PT Individual Time: 5732-2025 and 4270-6237 PT Individual Time Calculation (min): 55 min and 28 min  Short Term Goals: Week 1:  PT Short Term Goal 1 (Week 1): Pt will complete least restrictive transfer with min A consistently PT Short Term Goal 2 (Week 1): Pt will ambulate x 50 ft with LRAD and mod A PT Short Term Goal 3 (Week 1): Pt will maintain dynamic standing balance with min A x 5 min  Skilled Therapeutic Interventions/Progress Updates:   Treatment Session 1: 6283-1517 55 min Received pt supine in bed, pt agreeable to therapy, and denied any pain during session. RN present administering medications. Session with emphasis on dressing, functional mobility/transfers, generalized strengthening, dynamic standing balance/coordinaiton, ambulation, NMR, and improved activity tolerance. Pt continues to remain confused and verbose with frequent jabbering throughout session. Doffed bilateral wrist restraints total A and pt donned pants in supine with mod A. Pt transferred supine<>sitting EOB with supervision, use of bedrails, and cues for logroll technique. Doffed dirty gown and donned pull over shirt with mod A due to NG tube. Donned shoes sitting EOB with max A for time management. Pt transferred bed<>WC squat<>pivot with mod A with cues for hand placement and turning technique. Pt combed hair seated in WC at sink with supervision. Pt performed WC mobility 157ft alternating using bilateral UEs and LEs to therapy gym with supervision and verbal cues for technique. Pt with bilateral LE ataxia and difficulty sequencing movements when propelling with LEs. Pt ambulated 17ft x 1 and 77ft x 1 trial inside parallel bars with min A. Pt with LE ataxia and mild scissoring, flexed trunk, and narrow BOS requiring cues for wider BOS, upright posture, and overall technique. Pt  required multiple rest breaks throughout session due to increased fatigue. Pt performed mini squats x3 reps and alternating marching x 4 reps inside parallel bars with min A. Pt with increased difficulty with motor control and sequencing despite max verbal cues and demonstrated significant R LE ataxia when performing standing R hip flexion. Pt performed seated alternating marches 2x10 with emphasis on slow, controlled movements and focus/control of R LE. Pt transported back to room in [facility] total assist and transferred WC<>bed stand<>pivot without AD min A with same cues listed above. Doffed shoes with max A and pt transferred sit<>supine with supervision. Concluded session with pt supine in bed, needs within reach, and bed alarm on. Bilateral wrist restraints donned.   Treatment Session 2: 6160-7371 28 min Received pt supine in bed requesting to use restroom, pt agreeable to therapy, and denied any pain during session. Session with emphasis on toileting, functional mobility/transfers, generalized strengthening, dynamic standing balance/coordination, and improved activity tolerance. Doffed bilateral wrist restraints total A and pt transferred supine<>sitting EOB with HOB elevated, supervision, and verbal cues for logroll technique. Pt transferred stand<>pivot bed<>WC without AD mod A with cues for hand placement, stepping sequence, and turning technique. Pt with narrow BOS and decreased sequencing resulting in mild LOB and "plopping" into WC. Pt transferred stand<>pivot WC<>toilet with bedside commode over top with min A and use of grab bars and required max A to doff pants/brief. Pt incontinent of urine and with soiled brief and ultimately unsuccessful on toilet despite reporting urge to go. Donned clean brief total A and pt transferred sit<>stand with min A and use of grab bars and required total A for peri-care and max A  to pull pants/brief over hips. Pt washed hands seated in WC at sink with supervision and  transferred stand<>pivot mod A back to bed and sit<>supine with supervision. Concluded session with pt supine in bed, needs within reach, and bed alarm on. Bilateral wrist restraints donned and pt's wife present at bedside.     Therapy Documentation Precautions:  Precautions Precautions: Cervical, Back, Fall Precaution Comments: no brace needed Restrictions Weight Bearing Restrictions: No  Therapy/Group: Individual Therapy [name] PT, DPT   05/02/2020, 7:26 AM

## 2020-05-02 NOTE — Progress Notes (Cosign Needed)
Water protocol performed per patient request. Pt followed protocol directions and tolerated the protocol well. No complications noted. Continuing with plan of care. Janetta Hora, LPN

## 2020-05-02 NOTE — Progress Notes (Signed)
[place] PHYSICAL MEDICINE & REHABILITATION PROGRESS NOTE  Subjective/Complaints: Patient seen sitting up in bed this morning.  He states he slept well overnight.  He asks for his restraints to be removed.  Discussed agitation impulsiveness with nursing.  Discussed swallowing mobilization quality with therapies.  Voice with some improvement this morning.  ROS: Limited due to cognition, but appears to deny CP, shortness of breath, nausea, vomiting, diarrhea.  Objective: Vital Signs: Blood pressure (!) 167/82, pulse 65, temperature 98.8 F (37.1 C), temperature source Oral, resp. rate 18, height 6' (1.829 m), weight 92.5 kg, SpO2 97 %. ECHOCARDIOGRAM COMPLETE  Result Date: 05/01/2020    ECHOCARDIOGRAM REPORT   Patient Name:   DINA MOBLEY Date of Exam: 05/01/2020 Medical Rec #:  [id number]        Height:       72.0 in Accession #:    [id number]       Weight:       203.9 lb Date of Birth:  10-19-1941       BSA:          2.148 m Patient Age:    79 years         BP:           153/85 mmHg Patient Gender: M                HR:           81 bpm. Exam Location:  Inpatient Procedure: 2D Echo, Color Doppler and Cardiac Doppler Indications:    Bacteremia  History:        Patient has no prior history of Echocardiogram examinations.                 Risk Factors:Hypertension and Diabetes.  Sonographer:    Raquel Sarna Senior RDCS Referring Phys: [place]  1. Left ventricular ejection fraction, by estimation, is 55 to 60%. The left ventricle has normal function. The left ventricle has no regional wall motion abnormalities. There is moderate asymmetric left ventricular hypertrophy of the basal-septal segment. Left ventricular diastolic parameters were normal.  2. Right ventricular systolic function is normal. The right ventricular size is normal. Tricuspid regurgitation signal is inadequate for assessing PA pressure.  3. The mitral valve is degenerative. No evidence of mitral valve regurgitation.  4.  The aortic valve is tricuspid. Aortic valve regurgitation is not visualized. Mild aortic valve sclerosis is present, with no evidence of aortic valve stenosis.  5. Aortic dilatation noted. There is borderline dilatation of the aortic root measuring 39 mm.  6. The inferior vena cava is normal in size with greater than 50% respiratory variability, suggesting right atrial pressure of 3 mmHg.  7. Degenerative mitral valve. No clear vegetation seen. If clinical suspicion for endocarditis, consider TEE FINDINGS  Left Ventricle: Left ventricular ejection fraction, by estimation, is 55 to 60%. The left ventricle has normal function. The left ventricle has no regional wall motion abnormalities. The left ventricular internal cavity size was normal in size. There is  moderate asymmetric left ventricular hypertrophy of the basal-septal segment. Left ventricular diastolic parameters were normal. Right Ventricle: The right ventricular size is normal. Right vetricular wall thickness was not assessed. Right ventricular systolic function is normal. Tricuspid regurgitation signal is inadequate for assessing PA pressure. Left Atrium: Left atrial size was normal in size. Right Atrium: Right atrial size was normal in size. Pericardium: There is no evidence of pericardial effusion. Presence of pericardial fat pad. Mitral  Valve: The mitral valve is degenerative in appearance. Moderate mitral annular calcification. No evidence of mitral valve regurgitation. Tricuspid Valve: The tricuspid valve is normal in structure. Tricuspid valve regurgitation is not demonstrated. Aortic Valve: The aortic valve is tricuspid. Aortic valve regurgitation is not visualized. Mild aortic valve sclerosis is present, with no evidence of aortic valve stenosis. Pulmonic Valve: The pulmonic valve was not well visualized. Pulmonic valve regurgitation is not visualized. Aorta: Aortic dilatation noted. There is borderline dilatation of the aortic root measuring 39  mm. Venous: The inferior vena cava is normal in size with greater than 50% respiratory variability, suggesting right atrial pressure of 3 mmHg. IAS/Shunts: The interatrial septum was not well visualized.  LEFT VENTRICLE PLAX 2D LVIDd:         4.80 cm  Diastology LVIDs:         3.40 cm  LV e' lateral:   10.60 cm/s LV PW:         0.90 cm  LV E/e' lateral: 6.6 LV IVS:        1.20 cm  LV e' medial:    5.77 cm/s LVOT diam:     2.50 cm  LV E/e' medial:  12.2 LV SV:         74 LV SV Index:   35 LVOT Area:     4.91 cm  LEFT ATRIUM             Index       RIGHT ATRIUM           Index LA diam:        3.20 cm 1.49 cm/m  RA Area:     18.50 cm LA Vol (A2C):   63.1 ml 29.37 ml/m RA Volume:   39.80 ml  18.53 ml/m LA Vol (A4C):   47.5 ml 22.11 ml/m LA Biplane Vol: 55.3 ml 25.74 ml/m  AORTIC VALVE LVOT Vmax:   75.40 cm/s LVOT Vmean:  53.500 cm/s LVOT VTI:    0.151 m  AORTA Ao Root diam: 3.90 cm Ao Asc diam:  3.20 cm MITRAL VALVE MV Area (PHT): 2.91 cm    SHUNTS MV Decel Time: 261 msec    Systemic VTI:  0.15 m MV E velocity: 70.40 cm/s  Systemic Diam: 2.50 cm MV A velocity: 64.30 cm/s MV E/A ratio:  1.09 Oswaldo Milian MD Electronically signed by Oswaldo Milian MD Signature Date/Time: 05/01/2020/4:56:16 PM    Final    Recent Labs    04/30/20 1151 05/01/20 0540  WBC 18.7* 17.6*  HGB 15.0 14.4  HCT 44.4 42.8  PLT 400 321   Recent Labs    04/30/20 0833 05/01/20 0540  NA 140 143  K 3.9 4.1  CL 107 109  CO2 21* 22  GLUCOSE 264* 293*  BUN 23 28*  CREATININE 0.95 0.82  CALCIUM 8.5* 8.1*    Physical Exam: BP (!) 167/82 (BP Location: Left Arm)   Pulse 65   Temp 98.8 F (37.1 C) (Oral)   Resp 18   Ht 6' (1.829 m)   Wt 92.5 kg   SpO2 97%   BMI 27.66 kg/m   Constitutional: No distress . Vital signs reviewed. HENT: Normocephalic.  Atraumatic. Eyes: EOMI. No discharge. Cardiovascular: No JVD. Respiratory: Normal effort.  No stridor. GI: Non-distended. Skin: Right neck with incision C/D/I,  improving edema Psych: Normal mood.  Normal behavior. Musc: No edema in extremities.  No tenderness in extremities. Neurological: Alert and oriented Delayed processing,?  Improving Motor:  4+-5/5 throughout, improving  Assessment/Plan: 1. 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  Physiatrist is providing close team supervision and 24 hour management of active medical problems listed below.  Physiatrist and rehab team continue to assess barriers to discharge/monitor patient progress toward functional and medical goals  Care Tool:  Bathing    Body parts bathed by patient: Right arm, Left arm, Chest, Abdomen, Face   Body parts bathed by helper: Right arm, Left arm, Buttocks, Right lower leg, Left lower leg     Bathing assist Assist Level: Minimal Assistance - Patient > 75%(UB only)     Upper Body Dressing/Undressing Upper body dressing   What is the patient wearing?: Pull over shirt    Upper body assist Assist Level: Contact Guard/Touching assist    Lower Body Dressing/Undressing Lower body dressing      What is the patient wearing?: Incontinence brief     Lower body assist Assist for lower body dressing: Moderate Assistance - Patient 50 - 74%     Toileting Toileting    Toileting assist Assist for toileting: Maximal Assistance - Patient 25 - 49%     Transfers Chair/bed transfer  Transfers assist     Chair/bed transfer assist level: Moderate Assistance - Patient 50 - 74%     Locomotion Ambulation   Ambulation assist      Assist level: Moderate Assistance - Patient 50 - 74% Assistive device: Parallel bars Max distance: 57ft   Walk 10 feet activity   Assist  Walk 10 feet activity did not occur: Safety/medical concerns  Assist level: Moderate Assistance - Patient - 50 - 74% Assistive device: Parallel bars   Walk 50  feet activity   Assist Walk 50 feet with 2 turns activity did not occur: Safety/medical concerns         Walk 150 feet activity   Assist Walk 150 feet activity did not occur: Safety/medical concerns         Walk 10 feet on uneven surface  activity   Assist Walk 10 feet on uneven surfaces activity did not occur: Safety/medical concerns         Wheelchair     Assist Will patient use wheelchair at discharge?: (TBD) Type of Wheelchair: Manual Wheelchair activity did not occur: Safety/medical concerns  Wheelchair assist level: Supervision/Verbal cueing Max wheelchair distance: 161ft    Wheelchair 50 feet with 2 turns activity    Assist    Wheelchair 50 feet with 2 turns activity did not occur: Safety/medical concerns   Assist Level: Supervision/Verbal cueing   Wheelchair 150 feet activity     Assist Wheelchair 150 feet activity did not occur: Safety/medical concerns   Assist Level: Supervision/Verbal cueing      Medical Problem List and Plan: 1.  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  Continue CIR  IV Steroids started on 6/5, plan to transition tomorrow  Team conference today to discuss current and goals and coordination of care, home and environmental barriers, and discharge planning with nursing, case manager, and therapies.  2.  Antithrombotics: -DVT/anticoagulation:  Mechanical: Sequential compression devices, below knee Bilateral lower extremities             -antiplatelet therapy: N/A 3. Pain Management: Tylenol prn. Does not like Oxycodone.  Ordered ultram prn  Appears controlled on 6/9  Monitor with increased exertion  as well as cognition side effects 4. Mood: LCSW to follow for evaluation and support.              -antipsychotic agents: N/A 5. Neuropsych: This patient is not fully capable of making decisions on his own behalf. 6.  Skin/Wound Care: Monitor wound for healing.  7. Fluids/Electrolytes/Nutrition: Encourage fluid intake. Monitor I/O.  8. HTN: Monitor BP -continue amlodipine, Irbesartan, and metoprolol.   Elevated on 6/9, monitor with steroid wean  Monitor with increased mobility 9.  Steroid-induced hyperglycemia on T2DM with hyperglycemia: Hgb A1c- 7.5. Monitor BS ac/hs. Used amaryl 4 mg bid and lantus 2 units HS.   Amaryl on hold at present   Continue to taper lantus to home dose.   Elevated on 6/9, will consider medication adjustment if persistently elevated off steroids  Monitor with increased mobility 10. Leucocytosis: Likely secondary to steroids.  Monitor for fever or other signs of infection.              WBC 17.6 on 6/8  Afebrile  Continue to monitor 11. Constipation:              Adjust bowel meds as necessary 12. Hyperbilirubinemia:   Elevated on 6/5, continue to monitor 13. Pharyngeal dysphagia: ST to follow for evaluation.   N.p.o. with NG at present after MBS  Continue tube feeds  DC IVF  Advance diet as tolerated 14.  Urinary retention             ?  Improving  15.  Sepsis/aspiration pneumonia, associated delirium  Bcx + x 2 for staph hominis, discussed with pharmacy  Initial CXR's have demonstrated right lower lobe consolidation, repeat chest x-ray personally reviewed, showing same  Continue IV cefepime initiated along with IV Flagyl for now  Repeat blood cultures pending  Echo reviewed, no signs of vegetation, EF 55-60%   LOS: 5 days A FACE TO FACE EVALUATION WAS PERFORMED  Roselyne Stalnaker Lorie Phenix 05/02/2020, 9:14 AM

## 2020-05-02 NOTE — Progress Notes (Addendum)
Pharmacy Antibiotic Note  Johnathan Barker is a 79 y.o. male admitted on 04/27/2020 with possible aspiration PNA.  Pharmacy has been consulted for Cefepime dosing.   Pt is on D5 abx now for PNA. Blood culture came back with staph hominis in 1 set on 6/5, however, only one set was drawn. Staph hominis is a coag neg staph. Repeat cultures were sent and pending. Since staph hominis is a CNS, it could potentially be a contaminant but we only have one set. D/w Dr. Posey Pronto, we will go ahead and treat with cefepime for a total of 10d for possible bacteremia and PNA. Ok to [employer] flagyl  Plan:  Cont Cefepime 2 g IV Q8H x10d Rx signs off  Height: 6' (182.9 cm) Weight: 92.5 kg (203 lb 14.8 oz) IBW/kg (Calculated) : 77.6  Temp (24hrs), Avg:98.6 F (37 C), Min:98.4 F (36.9 C), Max:98.8 F (37.1 C)  Recent Labs  Lab 04/26/20 0404 04/27/20 0757 04/28/20 0813 04/30/20 0833 04/30/20 1151 05/01/20 0540  WBC 16.7* 16.0* 17.5*  --  18.7* 17.6*  CREATININE 1.00 0.92 0.96 0.95  --  0.82    Estimated Creatinine Clearance: 82.8 mL/min (by C-G formula based on SCr of 0.82 mg/dL).    Allergies  Allergen Reactions  . Oxycodone     Ineffective/disorientation  . Penicillins Hives, Rash and Other (See Comments)    FEVER  PATIENT HAS HAD A PCN REACTION WITH IMMEDIATE RASH, FACIAL/TONGUE/THROAT SWELLING, SOB, OR LIGHTHEADEDNESS WITH HYPOTENSION:  #  #  #  YES  #  #  #   Has patient had a PCN reaction causing severe rash involving mucus membranes or skin necrosis: No Has patient had a PCN reaction that required hospitalization: No Has patient had a PCN reaction occurring within the last 10 years: No If all of the above answers are "NO", then may proceed with Cephalosporin use.     Antimicrobials this admission: Cefepime 6/5>>6/15 Flagyl 6/5>>6/9  Dose adjustments this admission: None  Microbiology results: 6/5 BCx: 1 set positive for staph hominis 6/8 blood>>  Onnie Boer, PharmD, [place], AAHIVP,  [facility] Infectious Disease Pharmacist 05/02/2020 9:21 AM

## 2020-05-03 ENCOUNTER — Inpatient Hospital Stay (HOSPITAL_COMMUNITY): Payer: Medicare Other | Admitting: Occupational Therapy

## 2020-05-03 ENCOUNTER — Inpatient Hospital Stay (HOSPITAL_COMMUNITY): Payer: Medicare Other

## 2020-05-03 ENCOUNTER — Inpatient Hospital Stay (HOSPITAL_COMMUNITY): Payer: Medicare Other | Admitting: Speech Pathology

## 2020-05-03 LAB — GLUCOSE, CAPILLARY
Glucose-Capillary: 215 mg/dL — ABNORMAL HIGH (ref 70–99)
Glucose-Capillary: 281 mg/dL — ABNORMAL HIGH (ref 70–99)
Glucose-Capillary: 234 mg/dL — ABNORMAL HIGH (ref 70–99)
Glucose-Capillary: 217 mg/dL — ABNORMAL HIGH (ref 70–99)
Glucose-Capillary: 226 mg/dL — ABNORMAL HIGH (ref 70–99)

## 2020-05-03 MED ORDER — DEXAMETHASONE 2 MG PO TABS
2.0000 mg | ORAL_TABLET | Freq: Four times a day (QID) | ORAL | Status: DC
  Administered 2020-05-03 – 2020-05-04 (×4): 2 mg via ORAL
  Filled 2020-05-03 (×4): qty 1

## 2020-05-03 NOTE — Progress Notes (Signed)
[place] PHYSICAL MEDICINE & REHABILITATION PROGRESS NOTE  Subjective/Complaints: Patient seen sitting up in bed this morning, working with therapies.  He states he slept well overnight.  He is tearful and wants to speak with his wife.  ROS: Limited due to cognition, but appears to deny CP, shortness of breath, nausea, vomiting, diarrhea.  Objective: Vital Signs: Blood pressure (!) 143/80, pulse 86, temperature 98.3 F (36.8 C), temperature source Oral, resp. rate 17, height 6' (1.829 m), weight 92.5 kg, SpO2 96 %. ECHOCARDIOGRAM COMPLETE  Result Date: 05/01/2020    ECHOCARDIOGRAM REPORT   Patient Name:   Johnathan Barker Date of Exam: 05/01/2020 Medical Rec #:  [id number]        Height:       72.0 in Accession #:    [id number]       Weight:       203.9 lb Date of Birth:  1940-12-28       BSA:          2.148 m Patient Age:    79 years         BP:           153/85 mmHg Patient Gender: M                HR:           81 bpm. Exam Location:  Inpatient Procedure: 2D Echo, Color Doppler and Cardiac Doppler Indications:    Bacteremia  History:        Patient has no prior history of Echocardiogram examinations.                 Risk Factors:Hypertension and Diabetes.  Sonographer:    Raquel Sarna Senior RDCS Referring Phys: [place]  1. Left ventricular ejection fraction, by estimation, is 55 to 60%. The left ventricle has normal function. The left ventricle has no regional wall motion abnormalities. There is moderate asymmetric left ventricular hypertrophy of the basal-septal segment. Left ventricular diastolic parameters were normal.  2. Right ventricular systolic function is normal. The right ventricular size is normal. Tricuspid regurgitation signal is inadequate for assessing PA pressure.  3. The mitral valve is degenerative. No evidence of mitral valve regurgitation.  4. The aortic valve is tricuspid. Aortic valve regurgitation is not visualized. Mild aortic valve sclerosis is present, with  no evidence of aortic valve stenosis.  5. Aortic dilatation noted. There is borderline dilatation of the aortic root measuring 39 mm.  6. The inferior vena cava is normal in size with greater than 50% respiratory variability, suggesting right atrial pressure of 3 mmHg.  7. Degenerative mitral valve. No clear vegetation seen. If clinical suspicion for endocarditis, consider TEE FINDINGS  Left Ventricle: Left ventricular ejection fraction, by estimation, is 55 to 60%. The left ventricle has normal function. The left ventricle has no regional wall motion abnormalities. The left ventricular internal cavity size was normal in size. There is  moderate asymmetric left ventricular hypertrophy of the basal-septal segment. Left ventricular diastolic parameters were normal. Right Ventricle: The right ventricular size is normal. Right vetricular wall thickness was not assessed. Right ventricular systolic function is normal. Tricuspid regurgitation signal is inadequate for assessing PA pressure. Left Atrium: Left atrial size was normal in size. Right Atrium: Right atrial size was normal in size. Pericardium: There is no evidence of pericardial effusion. Presence of pericardial fat pad. Mitral Valve: The mitral valve is degenerative in appearance. Moderate mitral annular calcification. No evidence of  mitral valve regurgitation. Tricuspid Valve: The tricuspid valve is normal in structure. Tricuspid valve regurgitation is not demonstrated. Aortic Valve: The aortic valve is tricuspid. Aortic valve regurgitation is not visualized. Mild aortic valve sclerosis is present, with no evidence of aortic valve stenosis. Pulmonic Valve: The pulmonic valve was not well visualized. Pulmonic valve regurgitation is not visualized. Aorta: Aortic dilatation noted. There is borderline dilatation of the aortic root measuring 39 mm. Venous: The inferior vena cava is normal in size with greater than 50% respiratory variability, suggesting right atrial  pressure of 3 mmHg. IAS/Shunts: The interatrial septum was not well visualized.  LEFT VENTRICLE PLAX 2D LVIDd:         4.80 cm  Diastology LVIDs:         3.40 cm  LV e' lateral:   10.60 cm/s LV PW:         0.90 cm  LV E/e' lateral: 6.6 LV IVS:        1.20 cm  LV e' medial:    5.77 cm/s LVOT diam:     2.50 cm  LV E/e' medial:  12.2 LV SV:         74 LV SV Index:   35 LVOT Area:     4.91 cm  LEFT ATRIUM             Index       RIGHT ATRIUM           Index LA diam:        3.20 cm 1.49 cm/m  RA Area:     18.50 cm LA Vol (A2C):   63.1 ml 29.37 ml/m RA Volume:   39.80 ml  18.53 ml/m LA Vol (A4C):   47.5 ml 22.11 ml/m LA Biplane Vol: 55.3 ml 25.74 ml/m  AORTIC VALVE LVOT Vmax:   75.40 cm/s LVOT Vmean:  53.500 cm/s LVOT VTI:    0.151 m  AORTA Ao Root diam: 3.90 cm Ao Asc diam:  3.20 cm MITRAL VALVE MV Area (PHT): 2.91 cm    SHUNTS MV Decel Time: 261 msec    Systemic VTI:  0.15 m MV E velocity: 70.40 cm/s  Systemic Diam: 2.50 cm MV A velocity: 64.30 cm/s MV E/A ratio:  1.09 Oswaldo Milian MD Electronically signed by Oswaldo Milian MD Signature Date/Time: 05/01/2020/4:56:16 PM    Final    Recent Labs    04/30/20 1151 05/01/20 0540  WBC 18.7* 17.6*  HGB 15.0 14.4  HCT 44.4 42.8  PLT 400 321   Recent Labs    05/01/20 0540  NA 143  K 4.1  CL 109  CO2 22  GLUCOSE 293*  BUN 28*  CREATININE 0.82  CALCIUM 8.1*    Physical Exam: BP (!) 143/80 (BP Location: Right Arm)   Pulse 86   Temp 98.3 F (36.8 C) (Oral)   Resp 17   Ht 6' (1.829 m)   Wt 92.5 kg   SpO2 96%   BMI 27.66 kg/m   Constitutional: No distress . Vital signs reviewed. HENT: Normocephalic.  Atraumatic. Eyes: EOMI. No discharge. Cardiovascular: No JVD. Respiratory: Normal effort.  No stridor. GI: Non-distended. Skin: Right neck with incision C/D/I Psych: Normal mood.  Normal behavior. Musc: Right neck with edema Neurological: Alert Motor: 4+-5/5 throughout, unchanged  Assessment/Plan: 1. Functional deficits  secondary to myelopathy status post decompression which require 3+ hours per day of interdisciplinary therapy in a comprehensive inpatient rehab setting.  Physiatrist is providing close team supervision and 24  hour management of active medical problems listed below.  Physiatrist and rehab team continue to assess barriers to discharge/monitor patient progress toward functional and medical goals  Care Tool:  Bathing    Body parts bathed by patient: Right arm, Left arm, Chest, Abdomen, Face   Body parts bathed by helper: Right arm, Left arm, Buttocks, Right lower leg, Left lower leg     Bathing assist Assist Level: Minimal Assistance - Patient > 75% (UB only)     Upper Body Dressing/Undressing Upper body dressing   What is the patient wearing?: Pull over shirt    Upper body assist Assist Level: Contact Guard/Touching assist    Lower Body Dressing/Undressing Lower body dressing      What is the patient wearing?: Incontinence brief     Lower body assist Assist for lower body dressing: Moderate Assistance - Patient 50 - 74%     Toileting Toileting    Toileting assist Assist for toileting: Maximal Assistance - Patient 25 - 49%     Transfers Chair/bed transfer  Transfers assist     Chair/bed transfer assist level: Moderate Assistance - Patient 50 - 74%     Locomotion Ambulation   Ambulation assist      Assist level: Moderate Assistance - Patient 50 - 74% Assistive device: Parallel bars Max distance: 7ft   Walk 10 feet activity   Assist  Walk 10 feet activity did not occur: Safety/medical concerns  Assist level: Moderate Assistance - Patient - 50 - 74% Assistive device: Parallel bars   Walk 50 feet activity   Assist Walk 50 feet with 2 turns activity did not occur: Safety/medical concerns         Walk 150 feet activity   Assist Walk 150 feet activity did not occur: Safety/medical concerns         Walk 10 feet on uneven surface   activity   Assist Walk 10 feet on uneven surfaces activity did not occur: Safety/medical concerns         Wheelchair     Assist Will patient use wheelchair at discharge?:  (TBD) Type of Wheelchair: Manual Wheelchair activity did not occur: Safety/medical concerns  Wheelchair assist level: Supervision/Verbal cueing Max wheelchair distance: 145ft    Wheelchair 50 feet with 2 turns activity    Assist    Wheelchair 50 feet with 2 turns activity did not occur: Safety/medical concerns   Assist Level: Supervision/Verbal cueing   Wheelchair 150 feet activity     Assist Wheelchair 150 feet activity did not occur: Safety/medical concerns   Assist Level: Supervision/Verbal cueing      Medical Problem List and Plan: 1.  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  Continue CIR  IV Steroids started on 6/5, transition to p.o. on 6/10-discussed with pharmacy 2.  Antithrombotics: -DVT/anticoagulation:  Mechanical: Sequential compression devices, below knee Bilateral lower extremities             -antiplatelet therapy: N/A 3. Pain Management: Tylenol prn. Does not like Oxycodone.  Ordered ultram prn  Appears controlled on 6/10  Monitor with increased exertion as well as cognition side effects 4. Mood: LCSW to follow for evaluation and support.              -antipsychotic agents: N/A 5. Neuropsych: This patient is not fully capable of making decisions on his own behalf. 6. Skin/Wound Care: Monitor wound for healing.  7. Fluids/Electrolytes/Nutrition: Encourage fluid intake. Monitor  I/O.  8. HTN: Monitor BP -continue amlodipine, Irbesartan, and metoprolol.   Remains elevated on 6/10, monitor with steroid wean  Monitor with increased mobility 9.  Steroid-induced hyperglycemia on T2DM with hyperglycemia: Hgb A1c- 7.5. Monitor BS ac/hs. Used amaryl 4 mg bid and lantus 2 units  HS.   Amaryl on hold at present   Continue to taper lantus to home dose.   Labile on 6/10, will consider medication adjustment if persistently elevated off steroids  Monitor with increased mobility 10. Leucocytosis: Likely secondary to steroids.  Monitor for fever or other signs of infection.              WBC 17.6 on 6/8  Afebrile  Continue to monitor 11. Constipation:              Adjust bowel meds as necessary 12. Hyperbilirubinemia:   Elevated on 6/5, continue to monitor 13. Pharyngeal dysphagia: ST to follow for evaluation.   N.p.o. with NG at present after MBS, water protocol  Continue tube feeds  DC IVF  Advance diet as tolerated 14.  Urinary retention             Improving 15.  Sepsis/aspiration pneumonia, associated delirium  Bcx + x 2 for staph hominis, discussed with pharmacy  Initial CXR's have demonstrated right lower lobe consolidation, repeat chest x-ray personally reviewed, showing same  Continue IV cefepime  IV Flagyl DC'd after discussion with pharmacy  Repeat blood cultures no growth to date  Echo reviewed, no signs of vegetation, EF 55-60%   LOS: 6 days A FACE TO FACE EVALUATION WAS PERFORMED  Raelea Gosse Lorie Phenix 05/03/2020, 8:49 AM

## 2020-05-03 NOTE — Progress Notes (Signed)
Occupational Therapy Session Note  Patient Details  Name: Johnathan Barker MRN: [id number] Date of Birth: [birth year]  Today's Date: 05/03/2020 OT Individual Time: 8144-8185 and 6314-9702 OT Individual Time Calculation (min): 58 min and 44 mins   Short Term Goals: Week 1:  OT Short Term Goal 1 (Week 1): Pt will complete toilet or BSC transfer with 1 assist without Stedy OT Short Term Goal 2 (Week 1): Pt will complete LB dressing sit<stand with Min balance assist OT Short Term Goal 3 (Week 1): Pt will complete one grooming task while standing or in supported standing position near the sink to improve balance  Skilled Therapeutic Interventions/Progress Updates:    Session 1: Upon entering the room, pt supine in bed and sleeping soundly. Multiple attempts made before pt waking and agreeable to OT intervention. Pt declined toileting needs and requesting bathing tasks from EOB. Pt needing mod cuing for sequencing and initiation of tasks. Pt appears to be internally and externally distracted during this session and needing mod cuing to attend to task. Pt standing with mod A for LB clothing management this session. Pt then began to perseverate on calling wife with max multimodal cuing to return to task. Pt returning to supine secondary to fatigue and B soft wrist restraints applied. Call bell and all needs within reach. Bed alarm activated.   Session 2: Upon entering the room, pt supine in bed with wife present in room. Pt performs supine >sit with min A to EOB. Pt having been incontinent of urine and bed and clothing saturated. OT transferred pt with mod A into wheelchair with stand pivot transfer. Pt seated in wheelchair at sink to wash and change clothing items again this session with less cuing being needed in this session. Pt brushing teeth with set up A to obtain all needed items and increased time with min cuing to sequence task. Pt unable to utilize figure four position or leaning forward fully to  don B socks without partial assist to get the over toes. Pt is able to lean forward with assist from therapist for safety to tie shoe laces. Wife reports she would have to help him on occasion with this task. Pt may benefit from circle sitting on EOB. Pt remained in wheelchair and soft restraints donned while in wheelchair for safety. Call bell and all needed items within reach and wife present in room.   Therapy Documentation Precautions:  Precautions Precautions: Cervical, Back, Fall Precaution Comments: no brace needed Restrictions Weight Bearing Restrictions: No ADL: ADL Eating: NPO Grooming: Supervision/safety Where Assessed-Grooming: Edge of bed Upper Body Bathing: Minimal assistance Where Assessed-Upper Body Bathing: Edge of bed Lower Body Bathing: Moderate assistance Where Assessed-Lower Body Bathing: Edge of bed Upper Body Dressing: Minimal assistance Where Assessed-Upper Body Dressing: Edge of bed Lower Body Dressing: Maximal assistance Where Assessed-Lower Body Dressing: Edge of bed Toileting: Dependent (using Stedy) Where Assessed-Toileting: Bedside Commode Toilet Transfer: Dependent Toilet Transfer Method: Other (comment) Charlaine Dalton) Toilet Transfer Equipment: Radiographer, therapeutic: Not assessed   Therapy/Group: Individual Therapy  Gypsy Decant 05/03/2020, 12:47 PM

## 2020-05-03 NOTE — Progress Notes (Addendum)
Speech Language Pathology Daily Session Note  Patient Details  Name: Johnathan Barker MRN: [id number] Date of Birth: [birth year]  Today's Date: 05/03/2020 SLP Individual Time: 1415-1450 SLP Individual Time Calculation (min): 35 min  Short Term Goals: Week 1: SLP Short Term Goal 1 (Week 1): Pt will consume therapeutic trials of ice chips and/or puree textures with minimal overt s/sx aspiration and Min A for use of swallow strategies. SLP Short Term Goal 2 (Week 1): Pt will participate in instrumental swallow study in order to assess pharyngeal swallow function. SLP Short Term Goal 2 - Progress (Week 1): Met SLP Short Term Goal 3 (Week 1): Pt will sustain attention to functional tasks for 5 minute intervals with Mod a cues for redirection. SLP Short Term Goal 4 (Week 1): Pt will demonstrate carryover and recall of new and/or daily information with Mod A for use of memory strateiges/aids. SLP Short Term Goal 5 (Week 1): Pt will use strategies to compensate for wet vocal quality (throat clear, increased vocal intensity) to increase speech intelligibility to 85% at the sentence level.  Skilled Therapeutic Interventions: Pt was seen for skilled ST targeting dysphagia. [name] and son were present throughout session. Pt exhibited increasing fatigue throughout session, however he did arouse appropriately to participate in portion of session (missed last 10 minutes due to lethargy). After performing oral care, pt accepted trials of thin H2O via straw per the water protocol, with immediate apparent throat clear or cough in ~30% of trials. Total intake was 6-7 oz. Pt's strongest cough response was on very first sip of thin, and resulted in expectoration of thick but clear secretions. Pt did require overall Max A verbal and demonstrational cues to implement swallow strategies (volitional throat clear and swallow) during intake. Recommend continue NPO with water protocol. Pt's suction noted to be missing from  room; replaced with a suction in working order prior to leaving room. Pt and family with no further questions. Pt left laying in bed with alarm set and needs within reach, family still present. Continue per current plan of care.        Pain Pain Assessment Pain Scale: Faces Faces Pain Scale: No hurt  Therapy/Group: Individual Therapy  [name] 05/03/2020, 7:20 AM

## 2020-05-03 NOTE — Progress Notes (Signed)
Physical Therapy Session Note  Patient Details  Name: Johnathan Barker MRN: [id number] Date of Birth: Jul 07, 1941  Today's Date: 05/03/2020 PT Individual Time: 1300-1343 PT Individual Time Calculation (min): 43 min   Short Term Goals: Week 1:  PT Short Term Goal 1 (Week 1): Pt will complete least restrictive transfer with min A consistently PT Short Term Goal 2 (Week 1): Pt will ambulate x 50 ft with LRAD and mod A PT Short Term Goal 3 (Week 1): Pt will maintain dynamic standing balance with min A x 5 min  Skilled Therapeutic Interventions/Progress Updates:   Received pt sitting in WC, pt agreeable to therapy, and denied any pain at start of session but reported increased neck discomfort while in the gym. Session with emphasis on functional mobility/transfers, generalized strengthening, dynamic standing balance/coordination, NMR, and improved activity tolerance. Doffed bilateral wrist restraints with total A and pt transported to dayroom in [facility] total A for time management purposes. Pt transferred sit<>stand with RW min A and performed alternating marching 3x10 min A using mirror for visual feedback. Pt with R LE ataxia requiring cues to increase BOS, for upright posture, and for slow and controlled movements. Pt reported increased "neck" pain and pointed to L upper trapezius. Noted pt with small trigger point along L upper trapezius. Therapist performed manual therapy/trigger point release to L upper trap; pt reported relief and noted trigger point less prominent afterwards. Pt transferred sit<>stand with RW min A and performed R LE toe taps to cone 2x10 with emphasis on motor control/planning. Pt with continued R LE ataxia requiring same cues listed above. Pt transferred WC<>Nustep stand<>pivot without AD mod A with cues for hand placement and transfer technique and performed bilateral LE strengthening on Nustep at workload 4 for 5 minutes. Stand<>pivot Nustep<>WC mod A with same cues and pt  transported back to room in [facility] total A. Pt requested to return to bed due to fatigue and transferred stand<>pivot WC<>bed mod A and sit<>supine with supervision. Pt able to doff shoes with supervision. Concluded session with pt supine in bed, needs within reach, bed alarm on, and bilateral wrist restraints donned.   Therapy Documentation Precautions:  Precautions Precautions: Cervical, Back, Fall Precaution Comments: no brace needed Restrictions Weight Bearing Restrictions: No   Therapy/Group: Individual Therapy [name] PT, DPT   05/03/2020, 7:46 AM

## 2020-05-04 ENCOUNTER — Inpatient Hospital Stay (HOSPITAL_COMMUNITY): Payer: Medicare Other

## 2020-05-04 ENCOUNTER — Inpatient Hospital Stay (HOSPITAL_COMMUNITY): Payer: Medicare Other | Admitting: Occupational Therapy

## 2020-05-04 LAB — GLUCOSE, CAPILLARY
Glucose-Capillary: 150 mg/dL — ABNORMAL HIGH (ref 70–99)
Glucose-Capillary: 176 mg/dL — ABNORMAL HIGH (ref 70–99)
Glucose-Capillary: 203 mg/dL — ABNORMAL HIGH (ref 70–99)
Glucose-Capillary: 218 mg/dL — ABNORMAL HIGH (ref 70–99)
Glucose-Capillary: 259 mg/dL — ABNORMAL HIGH (ref 70–99)
Glucose-Capillary: 312 mg/dL — ABNORMAL HIGH (ref 70–99)

## 2020-05-04 MED ORDER — MUSCLE RUB 10-15 % EX CREA
TOPICAL_CREAM | Freq: Three times a day (TID) | CUTANEOUS | Status: DC
  Administered 2020-05-06 – 2020-05-16 (×7): 1 via TOPICAL
  Filled 2020-05-04: qty 85

## 2020-05-04 MED ORDER — TROLAMINE SALICYLATE 10 % EX CREA: TOPICAL_CREAM | Freq: Three times a day (TID) | CUTANEOUS | Status: DC

## 2020-05-04 MED ORDER — INSULIN ASPART 100 UNIT/ML ~~LOC~~ SOLN
0.0000 [IU] | SUBCUTANEOUS | Status: DC
  Administered 2020-05-04: 3 [IU] via SUBCUTANEOUS
  Administered 2020-05-04: 5 [IU] via SUBCUTANEOUS
  Administered 2020-05-04: 1 [IU] via SUBCUTANEOUS
  Administered 2020-05-04 – 2020-05-05 (×2): 3 [IU] via SUBCUTANEOUS
  Administered 2020-05-05 (×2): 5 [IU] via SUBCUTANEOUS
  Administered 2020-05-05 (×2): 3 [IU] via SUBCUTANEOUS
  Administered 2020-05-05 – 2020-05-06 (×2): 2 [IU] via SUBCUTANEOUS
  Administered 2020-05-06: 3 [IU] via SUBCUTANEOUS
  Administered 2020-05-06: 5 [IU] via SUBCUTANEOUS
  Administered 2020-05-06: 3 [IU] via SUBCUTANEOUS
  Administered 2020-05-06 (×2): 5 [IU] via SUBCUTANEOUS
  Administered 2020-05-07: 3 [IU] via SUBCUTANEOUS
  Administered 2020-05-07 (×2): 2 [IU] via SUBCUTANEOUS
  Administered 2020-05-07: 1 [IU] via SUBCUTANEOUS
  Administered 2020-05-07: 5 [IU] via SUBCUTANEOUS
  Administered 2020-05-08: 2 [IU] via SUBCUTANEOUS
  Administered 2020-05-08 (×2): 3 [IU] via SUBCUTANEOUS
  Administered 2020-05-08: 2 [IU] via SUBCUTANEOUS
  Administered 2020-05-08 (×2): 3 [IU] via SUBCUTANEOUS
  Administered 2020-05-09: 2 [IU] via SUBCUTANEOUS
  Administered 2020-05-09 (×2): 3 [IU] via SUBCUTANEOUS
  Administered 2020-05-09 (×2): 2 [IU] via SUBCUTANEOUS
  Administered 2020-05-09: 3 [IU] via SUBCUTANEOUS
  Administered 2020-05-10 (×2): 1 [IU] via SUBCUTANEOUS
  Administered 2020-05-10 – 2020-05-11 (×5): 2 [IU] via SUBCUTANEOUS
  Administered 2020-05-11: 3 [IU] via SUBCUTANEOUS
  Administered 2020-05-11 (×2): 2 [IU] via SUBCUTANEOUS
  Administered 2020-05-11: 5 [IU] via SUBCUTANEOUS
  Administered 2020-05-12 (×2): 2 [IU] via SUBCUTANEOUS
  Administered 2020-05-12: 3 [IU] via SUBCUTANEOUS
  Administered 2020-05-13 (×3): 2 [IU] via SUBCUTANEOUS
  Administered 2020-05-13 – 2020-05-14 (×4): 1 [IU] via SUBCUTANEOUS
  Administered 2020-05-14: 2 [IU] via SUBCUTANEOUS
  Administered 2020-05-15 (×4): 1 [IU] via SUBCUTANEOUS
  Administered 2020-05-15: 3 [IU] via SUBCUTANEOUS
  Administered 2020-05-16: 5 [IU] via SUBCUTANEOUS
  Administered 2020-05-16: 1 [IU] via SUBCUTANEOUS
  Administered 2020-05-16 (×2): 2 [IU] via SUBCUTANEOUS
  Administered 2020-05-16: 3 [IU] via SUBCUTANEOUS
  Administered 2020-05-17: 1 [IU] via SUBCUTANEOUS
  Administered 2020-05-17: 3 [IU] via SUBCUTANEOUS
  Administered 2020-05-17 (×2): 2 [IU] via SUBCUTANEOUS
  Administered 2020-05-17: 5 [IU] via SUBCUTANEOUS
  Administered 2020-05-18: 1 [IU] via SUBCUTANEOUS
  Administered 2020-05-18: 2 [IU] via SUBCUTANEOUS

## 2020-05-04 MED ORDER — DEXAMETHASONE 2 MG PO TABS
2.0000 mg | ORAL_TABLET | Freq: Two times a day (BID) | ORAL | Status: DC
  Administered 2020-05-04 – 2020-05-06 (×4): 2 mg via ORAL
  Filled 2020-05-04 (×4): qty 1

## 2020-05-04 MED ORDER — INSULIN ASPART 100 UNIT/ML ~~LOC~~ SOLN
5.0000 [IU] | SUBCUTANEOUS | Status: DC
  Administered 2020-05-04 – 2020-05-15 (×66): 5 [IU] via SUBCUTANEOUS

## 2020-05-04 NOTE — Progress Notes (Signed)
Occupational Therapy Session Note  Patient Details  Name: Johnathan Barker MRN: [id number] Date of Birth: 1941/04/11  Today's Date: 05/04/2020 OT Individual Time: 1300-1356 OT Individual Time Calculation (min): 56 min   Skilled Therapeutic Interventions/Progress Updates:    Pt greeted in the recliner wearing his soft wrist restraints, family present. Once pt was woken from a nap, he was a bit teary at sight of therapist and required encouragement to participate. Family very helpful throughout session in providing encouragement and emotional support to maximize his participation. Started with face washing, oral care, and handwashing while sitting at the sink. Pt required vcs for sequencing, able to weight shift forward with supervision to reach faucet levers and soap dispenser. Min-Mod A for maintaining balance while engaged in activity without support of the recliner. Next OT retrieved elastic shoelaces and threaded them through his sneakers. Pt able to doff his sneakers with LEs elevated in recliner and then don them again using seated figure 4 position with setup assist given that the tongue of sneaker was elevated. OT assisted with positioning of NG tube while son assisted pt with shaving, educated son on having pt set up shaving tasks and help with task termination. OT also provided pt with a shampoo cap and pt lathered and brushed hair with Min A. At end of session pt remained in the recliner with all needs within reach, safety belt fastened, B wrist restraints applied. Notified family that if they noticed any redness on wrists where restraints were placed to notify staff. Son and spouse verbalized understanding. They remained with pt upon OT departure.    Pt also drank water per water protocol during session after oral care completion. Results recorded on the sheet in his room.  Therapy Documentation Precautions:  Precautions Precautions: Cervical, Back, Fall Precaution Comments: no brace  needed Restrictions Weight Bearing Restrictions: No Pain: pt denied having pain during tx   ADL: ADL Eating: NPO Grooming: Supervision/safety Where Assessed-Grooming: Edge of bed Upper Body Bathing: Minimal assistance Where Assessed-Upper Body Bathing: Edge of bed Lower Body Bathing: Moderate assistance Where Assessed-Lower Body Bathing: Edge of bed Upper Body Dressing: Minimal assistance Where Assessed-Upper Body Dressing: Edge of bed Lower Body Dressing: Maximal assistance Where Assessed-Lower Body Dressing: Edge of bed Toileting: Dependent (using Stedy) Where Assessed-Toileting: Bedside Commode Toilet Transfer: Dependent Toilet Transfer Method: Other (comment) Charlaine Dalton) Toilet Transfer Equipment: Radiographer, therapeutic: Not assessed :     Therapy/Group: Individual Therapy  Shadi Larner A Oumar Marcott 05/04/2020, 4:32 PM

## 2020-05-04 NOTE — Plan of Care (Signed)
°  Problem: SCI BOWEL ELIMINATION Goal: RH STG MANAGE BOWEL WITH ASSISTANCE Description: STG Manage Bowel with Min Assistance. Outcome: Progressing Goal: RH STG SCI MANAGE BOWEL WITH MEDICATION WITH ASSISTANCE Description: STG SCI Manage bowel with medication with Min assistance. Outcome: Progressing   Problem: SCI BLADDER ELIMINATION Goal: RH STG MANAGE BLADDER WITH ASSISTANCE Description: STG Manage Bladder With Min Assistance Outcome: Progressing

## 2020-05-04 NOTE — Progress Notes (Signed)
Occupational Therapy Weekly Progress Note  Patient Details  Name: Johnathan Barker MRN: [id number] Date of Birth: [birth year]  Beginning of progress report period: April 28, 2020 End of progress report period: May 04, 2020  Today's Date: 05/04/2020 OT Individual Time: 6063-0160 OT Individual Time Calculation (min): 62 min    Patient has met 2 of 3 short term goals.  Pt is making steady progress towards goals.  Pt currently requires min-mod assist for squat pivot transfers and mod assist for stand pivot transfers due to decreased stability and motor planning in BLE.  Pt requires max multimodal cues during functional tasks of bathing and LB dressing due to internal and external distractions as well as decreased awareness and sequencing with tasks.  Pt currently requires increased assistance for safety due to impulsivity and decreased awareness.    Patient continues to demonstrate the following deficits: muscle weakness and muscle paralysis, decreased cardiorespiratoy endurance, abnormal tone, unbalanced muscle activation, ataxia, decreased coordination and decreased motor planning, decreased motor planning, decreased attention, decreased awareness, decreased problem solving, decreased safety awareness and decreased memory and decreased sitting balance, decreased standing balance and decreased postural control and therefore will continue to benefit from skilled OT intervention to enhance overall performance with BADL and Reduce care partner burden.  Patient progressing toward long term goals..  Continue plan of care.  OT Short Term Goals Week 1:  OT Short Term Goal 1 (Week 1): Pt will complete toilet or BSC transfer with 1 assist without Stedy OT Short Term Goal 1 - Progress (Week 1): Met OT Short Term Goal 2 (Week 1): Pt will complete LB dressing sit<stand with Min balance assist OT Short Term Goal 2 - Progress (Week 1): Partly met OT Short Term Goal 3 (Week 1): Pt will complete one grooming task  while standing or in supported standing position near the sink to improve balance OT Short Term Goal 3 - Progress (Week 1): Met Week 2:  OT Short Term Goal 1 (Week 2): Pt will don pants with min assist for standing balance OT Short Term Goal 2 (Week 2): Pt will complete toilet transfers min assist for improved postural control/sequencing OT Short Term Goal 3 (Week 2): Pt will be able to don shoes with setup assist (adaptive laces prn) OT Short Term Goal 4 (Week 2): Pt will completing toileting tasks (clothing management and hygiene) with min assist for standing balance  Skilled Therapeutic Interventions/Progress Updates:    Treatment session with focus on self-care retraining with functional transfers, sit > stand, dynamic standing balance, and increased awareness of situation and deficits.  Pt received supine in bed agreeable to bathing and dressing this session.  Pt reports being thirsty and frustrated that he is still not able to eat yet.  Educated on swallow concerns and current use of feeding through NG tube to ensure proper nourishment.  Completed bed mobility supervision and stand pivot transfer mod assist to w/c due to decreased coordination and awareness of BLE during transitional movement.  Engaged in oral care in prep for water protocol.  Pt engaged in small sips of water during session with cues for swallow strategies, pt with 3 coughs during 4 oz of water.  Engaged in bathing at sit > stand level with pt completing standing with min assist for standing balance while pt washed perineal area and buttocks.  Pt required assistance with threading LLE, but able to thread RLE in to pants and pull pants over hips with min assist for standing balance.  Engaged in shaving at sink with cues for thoroughness and use due to new electric razor.  Pt remained upright in at sink with staff supervision while completing grooming tasks.  PA arrived during session.  Discussed increased offerings of water during  sessions (with water protocol) and increasing activity out of room as pt continues to demonstrate memory deficits.  Pt may even benefit from use of largely written schedule to increase memory and awareness of therapy schedule/sessions.  Therapy Documentation Precautions:  Precautions Precautions: Cervical, Back, Fall Precaution Comments: no brace needed Restrictions Weight Bearing Restrictions: No General:   Vital Signs: Therapy Vitals Temp: 97.8 F (36.6 C) Temp Source: Oral Pulse Rate: 92 BP: (!) 174/88 Patient Position (if appropriate): Lying Oxygen Therapy SpO2: 98 % O2 Device: Room Air Pain:   Pt with no c/o pain   Therapy/Group: Individual Therapy  [name] 05/04/2020, 7:47 AM

## 2020-05-04 NOTE — Progress Notes (Addendum)
[place] PHYSICAL MEDICINE & REHABILITATION PROGRESS NOTE  Subjective/Complaints: Patient seen sitting up in bed this AM.  He is confused with nonsensical thoughts.   ROS: Limited due to cognition  Objective: Vital Signs: Blood pressure (!) 174/88, pulse 92, temperature 97.8 F (36.6 C), temperature source Oral, resp. rate 20, height 6' (1.829 m), weight 92.5 kg, SpO2 98 %. No results found. No results for input(s): WBC, HGB, HCT, PLT in the last 72 hours. No results for input(s): NA, K, CL, CO2, GLUCOSE, BUN, CREATININE, CALCIUM in the last 72 hours.  Physical Exam: BP (!) 174/88 (BP Location: Right Arm)   Pulse 92   Temp 97.8 F (36.6 C) (Oral)   Resp 20   Ht 6' (1.829 m)   Wt 92.5 kg   SpO2 98%   BMI 27.66 kg/m   Constitutional: No distress . Vital signs reviewed. HENT: Normocephalic.  Atraumatic. +NG Eyes: EOMI. No discharge. Cardiovascular: No JVD. Respiratory: Normal effort.  No stridor. GI: Non-distended. Skin: Right neck incision with edema C/D/I Psych: Confused. Musc: Right neck edema Neurological: Alert Motor: 4+-5/5 throughout, stable  Assessment/Plan: 1. 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  Physiatrist is providing close team supervision and 24 hour management of active medical problems listed below.  Physiatrist and rehab team continue to assess barriers to discharge/monitor patient progress toward functional and medical goals  Care Tool:  Bathing    Body parts bathed by patient: Right arm, Left arm, Chest, Abdomen, Face, Front perineal area, Right upper leg, Left upper leg   Body parts bathed by helper: Buttocks, Right lower leg, Left lower leg     Bathing assist Assist Level: Minimal Assistance - Patient > 75%     Upper Body Dressing/Undressing Upper body dressing   What is the patient wearing?: Pull over  shirt    Upper body assist Assist Level: Contact Guard/Touching assist    Lower Body Dressing/Undressing Lower body dressing      What is the patient wearing?: Incontinence brief, Pants     Lower body assist Assist for lower body dressing: Moderate Assistance - Patient 50 - 74%     Toileting Toileting    Toileting assist Assist for toileting: Maximal Assistance - Patient 25 - 49%     Transfers Chair/bed transfer  Transfers assist     Chair/bed transfer assist level: Moderate Assistance - Patient 50 - 74%     Locomotion Ambulation   Ambulation assist      Assist level: Moderate Assistance - Patient 50 - 74% Assistive device: Parallel bars Max distance: 77ft   Walk 10 feet activity   Assist  Walk 10 feet activity did not occur: Safety/medical concerns  Assist level: Moderate Assistance - Patient - 50 - 74% Assistive device: Parallel bars   Walk 50 feet activity   Assist Walk 50 feet with 2 turns activity did not occur: Safety/medical concerns         Walk 150 feet activity   Assist Walk 150 feet activity did not occur: Safety/medical concerns         Walk 10 feet on uneven surface  activity   Assist Walk 10 feet on uneven surfaces activity did not occur: Safety/medical concerns         Wheelchair     Assist Will patient use wheelchair at discharge?:  (TBD) Type of Wheelchair: Manual Wheelchair activity did not occur: Safety/medical  concerns  Wheelchair assist level: Supervision/Verbal cueing Max wheelchair distance: 169ft    Wheelchair 50 feet with 2 turns activity    Assist    Wheelchair 50 feet with 2 turns activity did not occur: Safety/medical concerns   Assist Level: Supervision/Verbal cueing   Wheelchair 150 feet activity     Assist Wheelchair 150 feet activity did not occur: Safety/medical concerns   Assist Level: Supervision/Verbal cueing      Medical Problem List and Plan: 1.  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  Continue CIR  IV Steroids started on 6/5, transition to p.o. on 6/10, decreased on 6/11 2.  Antithrombotics: -DVT/anticoagulation:  Mechanical: Sequential compression devices, below knee Bilateral lower extremities             -antiplatelet therapy: N/A 3. Pain Management: Tylenol prn. Does not like Oxycodone.  Ordered ultram prn  Appears controlled on 6/11  Monitor with increased exertion as well as cognition side effects 4. Mood: LCSW to follow for evaluation and support.              -antipsychotic agents: N/A 5. Neuropsych: This patient is not fully capable of making decisions on his own behalf. 6. Skin/Wound Care: Monitor wound for healing.  7. Fluids/Electrolytes/Nutrition: Encourage fluid intake. Monitor I/O.  8. HTN: Monitor BP -continue amlodipine, Irbesartan, and metoprolol.   Labile on 6/11, monitor with steroid wean  Monitor with increased mobility 9.  Steroid-induced hyperglycemia on T2DM with hyperglycemia: Hgb A1c- 7.5. Monitor BS ac/hs. Used amaryl 4 mg bid and lantus 2 units HS.   Amaryl on hold at present   Lantus 10  Novolog 5 with meals and at bedtime  Labile and elevated on 6/11, monitor with steroid wean  Monitor with increased mobility 10. Leucocytosis: Likely secondary to steroids + PNA.               WBC 17.6 on 6/8  Afebrile  Continue to monitor 11. Constipation:              Adjust bowel meds as necessary 12. Hyperbilirubinemia:   Elevated on 6/5, continue to monitor 13. Pharyngeal dysphagia: ST to follow for evaluation.   NPO with NG at present after MBS, water protocol  Continue tube feeds  DC IVF  Advance diet as tolerated 14.  Urinary retention             Improving 15.  Sepsis/aspiration pneumonia, associated delirium  Bcx + x 2 for staph hominis, discussed with pharmacy  Initial CXR's have demonstrated right lower  lobe consolidation, repeat chest x-ray personally reviewed, showing same  Continue IV cefepime  IV Flagyl DC'd after discussion with pharmacy  Repeat blood cultures no growth to date on 6/11  Echo reviewed, no signs of vegetation, EF 55-60% 16.  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

## 2020-05-04 NOTE — Progress Notes (Signed)
Physical Therapy Weekly Progress Note  Patient Details  Name: Johnathan Barker MRN: [id number] Date of Birth: [birth year]  Beginning of progress report period: April 28, 2020 End of progress report period: May 04, 2020  Today's Date: 05/04/2020 PT Individual Time: 1100-1200 PT Individual Time Calculation (min): 60 min   Patient has met 0 of 3 short term goals. Pt demonstrates very gradual progress towards long term goals. Pt currently able to perform bed mobility with supervision, transfers without AD mod A, and gait 69ft with RW mod A +2 for WC follow. Pt continues to demonstrate significant LE ataxia R>L, scissoring, narrow BOS, and flexed posture with gait. Pt also demonstrates poor safety awareness, cognitive deficits, and difficulty with motor control/planning and sequencing and requires mod/max cues with mobility. Pt currently with bilateral wrist restraints to prevent him from pulling out his NG tube.   Patient continues to demonstrate the following deficits muscle weakness, impaired timing and sequencing, unbalanced muscle activation, motor apraxia, ataxia, decreased coordination and decreased motor planning, decreased awareness, decreased problem solving, decreased safety awareness and decreased memory and decreased standing balance, decreased postural control and decreased balance strategies and therefore will continue to benefit from skilled PT intervention to increase functional independence with mobility.  Patient progressing toward long term goals..  Continue plan of care.  PT Short Term Goals Week 1:  PT Short Term Goal 1 (Week 1): Pt will complete least restrictive transfer with min A consistently PT Short Term Goal 1 - Progress (Week 1): Progressing toward goal PT Short Term Goal 2 (Week 1): Pt will ambulate x 50 ft with LRAD and mod A PT Short Term Goal 2 - Progress (Week 1): Progressing toward goal PT Short Term Goal 3 (Week 1): Pt will maintain dynamic standing balance with  min A x 5 min PT Short Term Goal 3 - Progress (Week 1): Progressing toward goal Week 2:  PT Short Term Goal 1 (Week 2): Pt will transfer bed<>chair with LRAD min A PT Short Term Goal 2 (Week 2): Pt will ambulate 57ft with LRAD mod A PT Short Term Goal 3 (Week 2): Pt will perform simulated car transfer with LRAD min A  Skilled Therapeutic Interventions/Progress Updates:  Ambulation/gait training;Balance/vestibular training;Cognitive remediation/compensation;Community reintegration;Discharge planning;Disease management/prevention;DME/adaptive equipment instruction;Functional mobility training;Neuromuscular re-education;Pain management;Patient/family education;Psychosocial support;Splinting/orthotics;Stair training;Therapeutic Activities;Therapeutic Exercise;UE/LE Strength taining/ROM;UE/LE Coordination activities;Wheelchair propulsion/positioning   Today's Interventions: Received pt supine in bed with wife present at bedside, pt agreeable to therapy, and denied any pain during session. Session with emphasis on functional mobility/transfers, generalized strengthening, dynamic standing balance/coordinaiton, ambulation, and improved activity tolerance. Doffed bilateral wrist restraints total A and pt donned pants in supine with mod A and rolled to L and R with supervision and use of bedrails to pull pants over hips. RN present to unhook NG tube. Pt transferred supine<>sitting EOB from flat bed with supervision, use of bedrails, and max verbal cues for logroll technique. Doffed dirty shirt and donned clean one with mod A. Pt able to don shoes sitting EOB with supervision and increased time. Showed pt's wife trigger point on pt's L upper trapezius and demonstrated technique for trigger point release when pt begins to c/o of discomfort. Pt transferred stand<>pivot bed<>WC without AD mod A with cues for technique. Pt transported to therapy gym in [facility] total A and ambulated 59ft with RW mod A +2 for WC follow. Pt  demonstrated flexed trunk, narrow BOS, scissoring, and ataxia R>L. Pt required cues for increased step length, increased BOS, and RW  safety. Pt transferred sit<>stand min A at table and performed the following exercises with min A, bilateral UE support on table, and max cues for technique: -alternating marching 3x10 -hip abduction 2x10 bilaterally -mini-squats 3x5 -hamstring curls x10 bilaterally Noted pt with significant ataxia RLE>LLE and required cues for slow pace and motor control with exercises. Pt transported back to room in [facility] total A and transferred WC<>recliner stand<>pivot mod A. Concluded session with pt sitting in recliner, needs within reach, and seatbelt alarm on. Bilateral wrist restraints donned.   Therapy Documentation Precautions:  Precautions Precautions: Cervical, Back, Fall Precaution Comments: no brace needed Restrictions Weight Bearing Restrictions: No  Therapy/Group: Individual Therapy [name] PT, DPT   05/04/2020, 7:37 AM

## 2020-05-04 NOTE — Progress Notes (Signed)
Speech Language Pathology Weekly Progress and Session Note  Patient Details  Name: Johnathan Barker MRN: [id number] Date of Birth: [birth year]  Beginning of progress report period: April 27, 2020 End of progress report period: May 04, 2020    Short Term Goals: Week 1: SLP Short Term Goal 1 (Week 1): Pt will consume therapeutic trials of ice chips and/or puree textures with minimal overt s/sx aspiration and Min A for use of swallow strategies. SLP Short Term Goal 1 - Progress (Week 1): Progressing toward goal SLP Short Term Goal 2 (Week 1): Pt will participate in instrumental swallow study in order to assess pharyngeal swallow function. SLP Short Term Goal 2 - Progress (Week 1): Met SLP Short Term Goal 3 (Week 1): Pt will sustain attention to functional tasks for 5 minute intervals with Mod a cues for redirection. SLP Short Term Goal 3 - Progress (Week 1): Met SLP Short Term Goal 4 (Week 1): Pt will demonstrate carryover and recall of new and/or daily information with Mod A for use of memory strateiges/aids. SLP Short Term Goal 4 - Progress (Week 1): Progressing toward goal SLP Short Term Goal 5 (Week 1): Pt will use strategies to compensate for wet vocal quality (throat clear, increased vocal intensity) to increase speech intelligibility to 85% at the sentence level. SLP Short Term Goal 5 - Progress (Week 1): Met    New Short Term Goals: Week 2: SLP Short Term Goal 1 (Week 2): Pt will consume therapeutic trials of ice chips and/or puree textures with minimal overt s/sx aspiration and Min A for use of swallow strategies. SLP Short Term Goal 2 (Week 2): Pt will demonstrate carryover and recall of new and/or daily information with Mod A for use of memory strateiges/aids. SLP Short Term Goal 3 (Week 2): Pt will sustain attention to functional tasks for 10 minute intervals with Min a cues for redirection. SLP Short Term Goal 4 (Week 2): Pt will demonstrate ability to problem solving basic  familiar situations with Mod A verbal/visual cues. SLP Short Term Goal 5 (Week 2): Pt will detect functional errors with Mod A verbal/visual cues.  Weekly Progress Updates: Pt has made slow functional gains but still met 3 out of 5 short term goals this reporting period. ST interventions have focused on dysphagia and continues diagnostic treatment of cognition this week. Pt is currently Mod-Max assist for basic cognitive tasks due to impairments impacting his short term memory, problem solving, emergent awareness, and attention. Pt participated in MBSS this week, with recommendation to remain NPO with temporary alternative means of nutrition, due to decreased airway protection believed to be primarily due to edema. Pt is on water protocol and is exhibiting less s/sx aspiration during trials per the protocol than day of evaluation. ST plans to repeat MBSS prior to diet advancement to determine least restrictive diet. Pt and family education is ongoing. Pt would continue to benefit from skilled ST while inpatient in order to maximize functional independence and reduce burden of care prior to discharge. Anticipate that pt will need 24/7 supervision at discharge in addition to [place] follow up at next level of care.       Intensity: Minumum of 1-2 x/day, 30 to 90 minutes Frequency: 3 to 5 out of 7 days Duration/Length of Stay: 05/15/20 Treatment/Interventions: Cognitive remediation/compensation;Cueing hierarchy;Dysphagia/aspiration precaution training;Functional tasks;Patient/family education;Internal/external aids;Speech/Language facilitation     Johnathan Barker 05/04/2020, 7:18 AM

## 2020-05-05 ENCOUNTER — Inpatient Hospital Stay (HOSPITAL_COMMUNITY): Payer: Medicare Other | Admitting: Occupational Therapy

## 2020-05-05 LAB — GLUCOSE, CAPILLARY
Glucose-Capillary: 196 mg/dL — ABNORMAL HIGH (ref 70–99)
Glucose-Capillary: 203 mg/dL — ABNORMAL HIGH (ref 70–99)
Glucose-Capillary: 233 mg/dL — ABNORMAL HIGH (ref 70–99)
Glucose-Capillary: 244 mg/dL — ABNORMAL HIGH (ref 70–99)
Glucose-Capillary: 264 mg/dL — ABNORMAL HIGH (ref 70–99)
Glucose-Capillary: 297 mg/dL — ABNORMAL HIGH (ref 70–99)

## 2020-05-05 MED ORDER — WHITE PETROLATUM EX OINT
TOPICAL_OINTMENT | CUTANEOUS | Status: DC | PRN
  Administered 2020-05-05: 1 via TOPICAL
  Filled 2020-05-05 (×2): qty 28.35

## 2020-05-05 MED ORDER — INSULIN GLARGINE 100 UNIT/ML ~~LOC~~ SOLN
14.0000 [IU] | Freq: Every day | SUBCUTANEOUS | Status: DC
  Administered 2020-05-06 – 2020-05-18 (×13): 14 [IU] via SUBCUTANEOUS
  Filled 2020-05-05 (×13): qty 0.14

## 2020-05-05 NOTE — Progress Notes (Signed)
Occupational Therapy Session Note  Patient Details  Name: Johnathan Barker MRN: [id number] Date of Birth: [birth year]  Today's Date: 05/05/2020 OT Individual Time: 1047-1200 OT Individual Time Calculation (min): 73 min    Short Term Goals: Week 2:  OT Short Term Goal 1 (Week 2): Pt will don pants with min assist for standing balance OT Short Term Goal 2 (Week 2): Pt will complete toilet transfers min assist for improved postural control/sequencing OT Short Term Goal 3 (Week 2): Pt will be able to don shoes with setup assist (adaptive laces prn) OT Short Term Goal 4 (Week 2): Pt will completing toileting tasks (clothing management and hygiene) with min assist for standing balance  Skilled Therapeutic Interventions/Progress Updates:   Treatment session with focus on self-care retraining with bathing/dressing, functional transfers, and dynamic standing balance.  Pt received supine in bed agreeable to therapy session.  Pt completed bed mobility supervision.  Therapist assisted pt with donning shoes, requiring max assist despite use of elastic laces.  Pt continues to demonstrate ataxia in BLE therefore increasing challenge with coordination of feet when donning shoes.  Completed stand pivot transfer mod assist to w/c with mod cues for sequencing and hand placement.  Engaged in bathing/dressing at sit > stand level at sink.  Completed oral care seated at sink with setup to correctly locate toothpaste.  Engaged in sips of water during bathing per water protocol with mod cues for swallow precautions.  Pt completed UB bathing/dressing with setup assist and CGA.  Min assist sit > stand for LB bathing/dressing.  Pt noted to have been incontinent of bowel/bladder.  When questioned, pt stated he knew he had to go but "staff doesn't come".  Educated on need to use call button to notify staff of toileting needs, even in incontinent prior to awareness.  Mod assist LB bathing with pt maintaining static standing  balance CGA while therapist washed buttocks and donned incontinence brief.  Pt donned pants with mod assist and was able to pull pants over hips with increased time and cues for sequencing and switching UE support.  Engaged in sit > stand at high-low table with min assist.  Engaged in reaching task at table with focus on weight shifting and maintaining good BOS during standing.  Pt demonstrating decreased ataxic movements in BLE during standing activity this session.  Pt remained upright in w/c with seat belt alarm on and wife present.    Therapy Documentation Precautions:  Precautions Precautions: Cervical, Back, Fall Precaution Comments: no brace needed Restrictions Weight Bearing Restrictions: No Pain:  Pt with no c/o pain  Therapy/Group: Individual Therapy  [name] 05/05/2020, 12:28 PM

## 2020-05-05 NOTE — Plan of Care (Signed)
  Problem: Consults Goal: RH SPINAL CORD INJURY PATIENT EDUCATION Description:  See Patient Education module for education specifics.  Outcome: Progressing   Problem: SCI BOWEL ELIMINATION Goal: RH STG MANAGE BOWEL WITH ASSISTANCE Description: STG Manage Bowel with Min Assistance. Outcome: Progressing Goal: RH STG SCI MANAGE BOWEL WITH MEDICATION WITH ASSISTANCE Description: STG SCI Manage bowel with medication with Min assistance. Outcome: Progressing   Problem: SCI BLADDER ELIMINATION Goal: RH STG MANAGE BLADDER WITH ASSISTANCE Description: STG Manage Bladder With Min Assistance Outcome: Progressing   Problem: RH SKIN INTEGRITY Goal: RH STG SKIN FREE OF INFECTION/BREAKDOWN Description: No new breakdown with min assist  Outcome: Progressing Goal: RH STG ABLE TO PERFORM INCISION/WOUND CARE W/ASSISTANCE Description: STG Able To Perform Incision/Wound Care With [employer]. Outcome: Progressing   Problem: RH SAFETY Goal: RH STG ADHERE TO SAFETY PRECAUTIONS W/ASSISTANCE/DEVICE Description: STG Adhere to Safety Precautions With Min Assistance/Device. Outcome: Progressing   Problem: RH PAIN MANAGEMENT Goal: RH STG PAIN MANAGED AT OR BELOW PT'S PAIN GOAL Description: Pt will manage pain at 3 or less on a scale of 0-10.  Outcome: Progressing   Problem: RH KNOWLEDGE DEFICIT SCI Goal: RH STG INCREASE KNOWLEDGE OF SELF CARE AFTER SCI Description: Pt will verbalize increased knowledge of caring for self after SCI including pain management, precautions, and wound care with min assist using educational materials provided by staff.  Outcome: Progressing

## 2020-05-05 NOTE — Progress Notes (Signed)
[facility] PHYSICAL MEDICINE & REHABILITATION PROGRESS NOTE  Subjective/Complaints: Pt up in bed. Confused, appears comfortable  ROS: Limited due to cognitive/behavioral    Objective: Vital Signs: Blood pressure (!) 129/91, pulse 70, temperature 98.7 F (37.1 C), temperature source Oral, resp. rate 18, height 6' (1.829 m), weight 92.5 kg, SpO2 97 %. No results found. No results for input(s): WBC, HGB, HCT, PLT in the last 72 hours. No results for input(s): NA, K, CL, CO2, GLUCOSE, BUN, CREATININE, CALCIUM in the last 72 hours.  Physical Exam: BP (!) 129/91 (BP Location: Left Arm)   Pulse 70   Temp 98.7 F (37.1 C) (Oral)   Resp 18   Ht 6' (1.829 m)   Wt 92.5 kg   SpO2 97%   BMI 27.66 kg/m   Constitutional: No distress . Vital signs reviewed. HEENT: EOMI, oral membranes moist, NGT Neck: supple Cardiovascular: RRR without murmur. No JVD    Respiratory/Chest: CTA Bilaterally without wheezes or rales. Normal effort    GI/Abdomen: BS +, non-tender, non-distended Ext: no clubbing, cyanosis, or edema Psych: pleasant and cooperative Skin: Right neck incision with edema C/D/I Psych: Confused. Follows basic commands. Fairly alert Musc: Right neck edema Neurological:   Motor: 4+-5/5 throughout, stable  Assessment/Plan: 1. 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  Physiatrist is providing close team supervision and 24 hour management of active medical problems listed below.  Physiatrist and rehab team continue to assess barriers to discharge/monitor patient progress toward functional and medical goals  Care Tool:  Bathing    Body parts bathed by patient: Right arm, Left arm, Chest, Abdomen, Face, Front perineal area, Right upper leg, Left upper leg, Buttocks   Body parts bathed by helper: Buttocks, Right lower leg, Left lower leg     Bathing  assist Assist Level: Minimal Assistance - Patient > 75%     Upper Body Dressing/Undressing Upper body dressing   What is the patient wearing?: Pull over shirt    Upper body assist Assist Level: Contact Guard/Touching assist    Lower Body Dressing/Undressing Lower body dressing      What is the patient wearing?: Incontinence brief, Pants     Lower body assist Assist for lower body dressing: Moderate Assistance - Patient 50 - 74%     Toileting Toileting Toileting Activity did not occur Landscape architect and hygiene only): Refused  Toileting assist Assist for toileting: Maximal Assistance - Patient 25 - 49%     Transfers Chair/bed transfer  Transfers assist     Chair/bed transfer assist level: Moderate Assistance - Patient 50 - 74%     Locomotion Ambulation   Ambulation assist      Assist level: 2 helpers Assistive device: Walker-rolling Max distance: 59ft   Walk 10 feet activity   Assist  Walk 10 feet activity did not occur: Safety/medical concerns  Assist level: 2 helpers Assistive device: Walker-rolling   Walk 50 feet activity   Assist Walk 50 feet with 2 turns activity did not occur: Safety/medical concerns  Assist level: 2 helpers Assistive device: Walker-rolling    Walk 150 feet activity   Assist Walk 150 feet activity did not occur: Safety/medical concerns         Walk 10 feet on uneven surface  activity   Assist Walk 10 feet on uneven surfaces activity did not occur: Safety/medical concerns         Wheelchair  Assist Will patient use wheelchair at discharge?:  (TBD) Type of Wheelchair: Manual Wheelchair activity did not occur: Safety/medical concerns  Wheelchair assist level: Supervision/Verbal cueing Max wheelchair distance: 153ft    Wheelchair 50 feet with 2 turns activity    Assist    Wheelchair 50 feet with 2 turns activity did not occur: Safety/medical concerns   Assist Level: Supervision/Verbal  cueing   Wheelchair 150 feet activity     Assist Wheelchair 150 feet activity did not occur: Safety/medical concerns   Assist Level: Supervision/Verbal cueing      Medical Problem List and Plan: 1.  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  Continue CIR  IV Steroids started on 6/5, transition to p.o. on 6/10, decreased on 6/11 2.  Antithrombotics: -DVT/anticoagulation:  Mechanical: Sequential compression devices, below knee Bilateral lower extremities             -antiplatelet therapy: N/A 3. Pain Management: Tylenol prn. Does not like Oxycodone.  Ordered ultram prn  Appears controlled on 6/12  Monitor with increased exertion as well as cognition side effects 4. Mood: LCSW to follow for evaluation and support.              -antipsychotic agents: N/A 5. Neuropsych: This patient is not fully capable of making decisions on his own behalf. 6. Skin/Wound Care: Monitor wound for healing.  7. Fluids/Electrolytes/Nutrition: Encourage fluid intake. Monitor I/O.  8. HTN: Monitor BP -continue amlodipine, Irbesartan, and metoprolol.   borderline on 6/12, monitor with steroid wean  Monitor with increased mobility 9.  Steroid-induced hyperglycemia on T2DM with hyperglycemia: Hgb A1c- 7.5. Monitor BS ac/hs. Used amaryl 4 mg bid and lantus 2 units HS.   Amaryl on hold at present   Lantus 10  Novolog 5 with meals and at bedtime  Still elevated despite decreasing steroids. Increase lantus to 14u 10. Leucocytosis: Likely secondary to steroids + PNA.               WBC 17.6 on 6/8  Afebrile  Continue to monitor 11. Constipation:              Adjust bowel meds as necessary 12. Hyperbilirubinemia:   Elevated on 6/5, continue to monitor 13. Pharyngeal dysphagia: ST to follow for evaluation.   NPO with NG at present after MBS, water protocol  Continue tube feeds  DC IVF  Advance diet as  tolerated 14.  Urinary retention             Improving 15.  Sepsis/aspiration pneumonia, associated delirium  Bcx + x 2 for staph hominis, discussed with pharmacy  Initial CXR's have demonstrated right lower lobe consolidation, repeat chest x-ray personally reviewed, showing same  Continue IV cefepime  IV Flagyl DC'd after discussion with pharmacy  Repeat blood cultures no growth to date on 6/11  Echo reviewed, no signs of vegetation, EF 55-60% 16.  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

## 2020-05-06 ENCOUNTER — Inpatient Hospital Stay (HOSPITAL_COMMUNITY): Payer: Medicare Other | Admitting: Speech Pathology

## 2020-05-06 LAB — GLUCOSE, CAPILLARY
Glucose-Capillary: 190 mg/dL — ABNORMAL HIGH (ref 70–99)
Glucose-Capillary: 207 mg/dL — ABNORMAL HIGH (ref 70–99)
Glucose-Capillary: 220 mg/dL — ABNORMAL HIGH (ref 70–99)
Glucose-Capillary: 258 mg/dL — ABNORMAL HIGH (ref 70–99)
Glucose-Capillary: 277 mg/dL — ABNORMAL HIGH (ref 70–99)
Glucose-Capillary: 282 mg/dL — ABNORMAL HIGH (ref 70–99)

## 2020-05-06 LAB — CULTURE, BLOOD (ROUTINE X 2)
Culture: NO GROWTH
Culture: NO GROWTH
Special Requests: ADEQUATE
Special Requests: ADEQUATE

## 2020-05-06 MED ORDER — DEXAMETHASONE 2 MG PO TABS
1.0000 mg | ORAL_TABLET | Freq: Two times a day (BID) | ORAL | Status: DC
  Administered 2020-05-06 – 2020-05-09 (×6): 1 mg via ORAL
  Filled 2020-05-06 (×6): qty 1

## 2020-05-06 NOTE — Progress Notes (Signed)
[facility] PHYSICAL MEDICINE & REHABILITATION PROGRESS NOTE  Subjective/Complaints: Sitting up in bed. No problems overnight. Remains confused  ROS: Limited due to cognitive/behavioral    Objective: Vital Signs: Blood pressure 131/75, pulse 73, temperature 97.9 F (36.6 C), resp. rate 17, height 6' (1.829 m), weight 92.5 kg, SpO2 97 %. No results found. No results for input(s): WBC, HGB, HCT, PLT in the last 72 hours. No results for input(s): NA, K, CL, CO2, GLUCOSE, BUN, CREATININE, CALCIUM in the last 72 hours.  Physical Exam: BP 131/75    Pulse 73    Temp 97.9 F (36.6 C)    Resp 17    Ht 6' (1.829 m)    Wt 92.5 kg    SpO2 97%    BMI 27.66 kg/m   Constitutional: No distress . Vital signs reviewed. HEENT: EOMI, oral membranes moist Neck: supple Cardiovascular: RRR without murmur. No JVD    Respiratory/Chest: CTA Bilaterally without wheezes or rales. Normal effort    GI/Abdomen: BS +, non-tender, non-distended Ext: no clubbing, cyanosis, or edema Psych: confused. Language of confusion Skin: Right neck incision with edema C/D/I Psych: Confused. Follows basic commands. Fairly alert Musc: Right neck edema Neurological:   Motor: 4+-5/5 throughout, stable  Assessment/Plan: 1. 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  Physiatrist is providing close team supervision and 24 hour management of active medical problems listed below.  Physiatrist and rehab team continue to assess barriers to discharge/monitor patient progress toward functional and medical goals  Care Tool:  Bathing    Body parts bathed by patient: Right arm, Left arm, Chest, Abdomen, Face, Front perineal area, Right upper leg, Left upper leg   Body parts bathed by helper: Buttocks     Bathing assist Assist Level: Minimal Assistance - Patient > 75%     Upper Body  Dressing/Undressing Upper body dressing   What is the patient wearing?: Pull over shirt    Upper body assist Assist Level: Contact Guard/Touching assist    Lower Body Dressing/Undressing Lower body dressing      What is the patient wearing?: Incontinence brief, Pants     Lower body assist Assist for lower body dressing: Moderate Assistance - Patient 50 - 74%     Toileting Toileting Toileting Activity did not occur Landscape architect and hygiene only): Refused  Toileting assist Assist for toileting: Maximal Assistance - Patient 25 - 49%     Transfers Chair/bed transfer  Transfers assist     Chair/bed transfer assist level: Moderate Assistance - Patient 50 - 74%     Locomotion Ambulation   Ambulation assist      Assist level: 2 helpers Assistive device: Walker-rolling Max distance: 20ft   Walk 10 feet activity   Assist  Walk 10 feet activity did not occur: Safety/medical concerns  Assist level: 2 helpers Assistive device: Walker-rolling   Walk 50 feet activity   Assist Walk 50 feet with 2 turns activity did not occur: Safety/medical concerns  Assist level: 2 helpers Assistive device: Walker-rolling    Walk 150 feet activity   Assist Walk 150 feet activity did not occur: Safety/medical concerns         Walk 10 feet on uneven surface  activity   Assist Walk 10 feet on uneven surfaces activity did not occur: Safety/medical concerns         Wheelchair     Assist Will patient use  wheelchair at discharge?:  (TBD) Type of Wheelchair: Manual Wheelchair activity did not occur: Safety/medical concerns  Wheelchair assist level: Supervision/Verbal cueing Max wheelchair distance: 114ft    Wheelchair 50 feet with 2 turns activity    Assist    Wheelchair 50 feet with 2 turns activity did not occur: Safety/medical concerns   Assist Level: Supervision/Verbal cueing   Wheelchair 150 feet activity     Assist Wheelchair 150  feet activity did not occur: Safety/medical concerns   Assist Level: Supervision/Verbal cueing      Medical Problem List and Plan: 1.  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  Continue CIR  IV Steroids started on 6/5, transition to p.o. on 6/10, decreased on 6/11---further taper today  2.  Antithrombotics: -DVT/anticoagulation:  Mechanical: Sequential compression devices, below knee Bilateral lower extremities             -antiplatelet therapy: N/A 3. Pain Management: Tylenol prn. Does not like Oxycodone.  Ordered ultram prn  Appears controlled on 6/13  Monitor with increased exertion as well as cognition side effects 4. Mood: LCSW to follow for evaluation and support.              -antipsychotic agents: N/A 5. Neuropsych: This patient is not fully capable of making decisions on his own behalf. 6. Skin/Wound Care: Monitor wound for healing.  7. Fluids/Electrolytes/Nutrition: Encourage fluid intake. Monitor I/O.  8. HTN: Monitor BP -continue amlodipine, Irbesartan, and metoprolol.   borderline on 6/12, monitor with steroid wean  Monitor with increased mobility 9.  Steroid-induced hyperglycemia on T2DM with hyperglycemia: Hgb A1c- 7.5. Monitor BS ac/hs. Used amaryl 4 mg bid and lantus 2 units HS.   Amaryl on hold at present   Lantus 10  Novolog 5 with meals and at bedtime  Continues to be poorly controlled. Lantus increased to 14u daily effective this morning 6/13--observe 10. Leucocytosis: Likely secondary to steroids + PNA.               WBC 17.6 on 6/8  Afebrile  Continue to monitor 11. Constipation:              Adjust bowel meds as necessary 12. Hyperbilirubinemia:   Elevated on 6/5, continue to monitor 13. Pharyngeal dysphagia: ST to follow for evaluation.   NPO with NG at present after MBS, water protocol  Continue tube feeds  DC IVF  Advance diet as tolerated 14.   Urinary retention             Improving 15.  Sepsis/aspiration pneumonia, associated delirium. Currently afebrile  Bcx + x 2 for staph hominis, discussed with pharmacy  Initial CXR's have demonstrated right lower lobe consolidation, repeat chest x-ray personally reviewed, showing same  Continue IV cefepime  IV Flagyl DC'd after discussion with pharmacy  Repeat blood cultures no growth to date on 6/11  Echo reviewed, no signs of vegetation, EF 55-60% 16.  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     -may need to restart IV flagyl if no improvement or blood cx +    - Cont wrist restraints for safety/NG    -hold all narcs for now   LOS: 9 days A FACE TO FACE EVALUATION WAS PERFORMED  Meredith Staggers 05/06/2020, 10:11 AM

## 2020-05-06 NOTE — Progress Notes (Signed)
Speech Language Pathology Daily Session Note  Patient Details  Name: Johnathan Barker MRN: [id number] Date of Birth: 12/03/1940  Today's Date: 05/06/2020 SLP Individual Time: 1450-1535 SLP Individual Time Calculation (min): 45 min  Short Term Goals: Week 2: SLP Short Term Goal 1 (Week 2): Pt will consume therapeutic trials of ice chips and/or puree textures with minimal overt s/sx aspiration and Min A for use of swallow strategies. SLP Short Term Goal 2 (Week 2): Pt will demonstrate carryover and recall of new and/or daily information with Mod A for use of memory strateiges/aids. SLP Short Term Goal 3 (Week 2): Pt will sustain attention to functional tasks for 10 minute intervals with Min a cues for redirection. SLP Short Term Goal 4 (Week 2): Pt will demonstrate ability to problem solving basic familiar situations with Mod A verbal/visual cues. SLP Short Term Goal 5 (Week 2): Pt will detect functional errors with Mod A verbal/visual cues.  Skilled Therapeutic Interventions:  Pt was seen for skilled ST targeting dysphagia goals.  SLP facilitated the session with trials of straw sips of water following oral care per the water protocol.  Pt demonstrated immediate coughing on almost 100% of trials and needed max assist multimodal cues for use of swallowing precautions.  Suspect edema and limited space in the pharynx from Cortrak is continuing to impact bolus flow and airway protection given pt's tendency to grimace and bear down when swallowing.  Pt's mentation initially appeared pretty clear at the beginning of today's session; however, as session progressed pt had increased language of confusion.  [name] presented with questions regarding the nature of pt's swallowing dysfunction and SLP reviewed results from most recent MBS.  Pt was left in bed with bed alarm set and call bell within reach.  Wife was at bedside and bilateral wrist restraints were reapplied.  Continue per current plan of care.     Pain Pain Assessment Pain Scale: 0-10 Pain Score: 0-No pain  Therapy/Group: Individual Therapy  Johnathan Barker, Selinda Orion 05/06/2020, 3:47 PM

## 2020-05-06 NOTE — Plan of Care (Signed)
  Problem: Consults Goal: RH SPINAL CORD INJURY PATIENT EDUCATION Description:  See Patient Education module for education specifics.  Outcome: Progressing   Problem: SCI BOWEL ELIMINATION Goal: RH STG MANAGE BOWEL WITH ASSISTANCE Description: STG Manage Bowel with Min Assistance. Outcome: Progressing Goal: RH STG SCI MANAGE BOWEL WITH MEDICATION WITH ASSISTANCE Description: STG SCI Manage bowel with medication with Min assistance. Outcome: Progressing   Problem: SCI BLADDER ELIMINATION Goal: RH STG MANAGE BLADDER WITH ASSISTANCE Description: STG Manage Bladder With Min Assistance Outcome: Progressing   Problem: RH SKIN INTEGRITY Goal: RH STG SKIN FREE OF INFECTION/BREAKDOWN Description: No new breakdown with min assist  Outcome: Progressing Goal: RH STG ABLE TO PERFORM INCISION/WOUND CARE W/ASSISTANCE Description: STG Able To Perform Incision/Wound Care With [employer]. Outcome: Progressing   Problem: RH SAFETY Goal: RH STG ADHERE TO SAFETY PRECAUTIONS W/ASSISTANCE/DEVICE Description: STG Adhere to Safety Precautions With Min Assistance/Device. Outcome: Progressing   Problem: RH PAIN MANAGEMENT Goal: RH STG PAIN MANAGED AT OR BELOW PT'S PAIN GOAL Description: Pt will manage pain at 3 or less on a scale of 0-10.  Outcome: Progressing   Problem: RH KNOWLEDGE DEFICIT SCI Goal: RH STG INCREASE KNOWLEDGE OF SELF CARE AFTER SCI Description: Pt will verbalize increased knowledge of caring for self after SCI including pain management, precautions, and wound care with min assist using educational materials provided by staff.  Outcome: Progressing

## 2020-05-07 ENCOUNTER — Inpatient Hospital Stay (HOSPITAL_COMMUNITY): Payer: Medicare Other | Admitting: Occupational Therapy

## 2020-05-07 ENCOUNTER — Inpatient Hospital Stay (HOSPITAL_COMMUNITY): Payer: Medicare Other

## 2020-05-07 ENCOUNTER — Inpatient Hospital Stay (HOSPITAL_COMMUNITY): Payer: Medicare Other | Admitting: Physical Therapy

## 2020-05-07 DIAGNOSIS — D72828 Other elevated white blood cell count: Secondary | ICD-10-CM

## 2020-05-07 LAB — BASIC METABOLIC PANEL
Anion gap: 8 (ref 5–15)
BUN: 28 mg/dL — ABNORMAL HIGH (ref 8–23)
CO2: 30 mmol/L (ref 22–32)
Calcium: 8.8 mg/dL — ABNORMAL LOW (ref 8.9–10.3)
Chloride: 102 mmol/L (ref 98–111)
Creatinine, Ser: 0.93 mg/dL (ref 0.61–1.24)
GFR calc Af Amer: >60 mL/min (ref >60)
GFR calc non Af Amer: >60 mL/min (ref >60)
Glucose, Bld: 219 mg/dL — ABNORMAL HIGH (ref 70–99)
Potassium: 4.6 mmol/L (ref 3.5–5.1)
Sodium: 140 mmol/L (ref 135–145)

## 2020-05-07 LAB — CBC
HCT: 44.9 % (ref 39.0–52.0)
Hemoglobin: 14.8 g/dL (ref 13.0–17.0)
MCH: 30.3 pg (ref 26.0–34.0)
MCHC: 33 g/dL (ref 30.0–36.0)
MCV: 91.8 fL (ref 80.0–100.0)
Platelets: 357 10*3/uL (ref 150–400)
RBC: 4.89 MIL/uL (ref 4.22–5.81)
RDW: 13.1 % (ref 11.5–15.5)
WBC: 20.9 10*3/uL — ABNORMAL HIGH (ref 4.0–10.5)
nRBC: 0 % (ref 0.0–0.2)

## 2020-05-07 LAB — GLUCOSE, CAPILLARY
Glucose-Capillary: 252 mg/dL — ABNORMAL HIGH (ref 70–99)
Glucose-Capillary: 208 mg/dL — ABNORMAL HIGH (ref 70–99)
Glucose-Capillary: 176 mg/dL — ABNORMAL HIGH (ref 70–99)
Glucose-Capillary: 146 mg/dL — ABNORMAL HIGH (ref 70–99)
Glucose-Capillary: 160 mg/dL — ABNORMAL HIGH (ref 70–99)

## 2020-05-07 NOTE — Progress Notes (Signed)
Occupational Therapy Session Note  Patient Details  Name: Johnathan Barker MRN: [id number] Date of Birth: 04-21-1941  Today's Date: 05/07/2020 OT Individual Time: 2952-8413 OT Individual Time Calculation (min): 57 min   Short Term Goals: Week 2:  OT Short Term Goal 1 (Week 2): Pt will don pants with min assist for standing balance OT Short Term Goal 2 (Week 2): Pt will complete toilet transfers min assist for improved postural control/sequencing OT Short Term Goal 3 (Week 2): Pt will be able to don shoes with setup assist (adaptive laces prn) OT Short Term Goal 4 (Week 2): Pt will completing toileting tasks (clothing management and hygiene) with min assist for standing balance  Skilled Therapeutic Interventions/Progress Updates:    Pt greeted in bed with family present. ADL needs were met. Pt agreeable to go outdoors during session for psychosocial health. RN in to disconnect IV and tube feed. Pt then completed supine<sit with CGA and HOB elevated. OT donned his sneakers for time mgt. Min A for sit<stand and Min-Mod A for standing balance due to lateral swaying while son threaded his belt into pants. Mod A for stand pivot<w/c with pt exhibiting ataxic LE movements. He was then escorted to the outdoor patio. Blocked practice sit<stands completed, focusing on hand placement and technique to maximize his independence. Pt required CGA-Min A for power ups, Min-Mod for static and dynamic balance. Son able to provide safety cuing to pt regarding power up technique after OT demonstrated. While seated, pt engaged in B UE therapeutic exercises using the 2# bar x10 reps. He was then returned to room via w/c and completed stand pivot<bed with Mod A using RW. Min A for transition back to supine and pt was able to boost himself up in bed when his hands were placed on the headboard. Left pt with all needs within reach, bed alarm set, family present, and in care of RN to reconnect pt to IV and tube feed.   Therapy  Documentation Precautions:  Precautions Precautions: Cervical, Back, Fall Precaution Comments: no brace needed Restrictions Weight Bearing Restrictions: No Pain: no c/o pain during session    ADL: ADL Eating: NPO Grooming: Supervision/safety Where Assessed-Grooming: Edge of bed Upper Body Bathing: Minimal assistance Where Assessed-Upper Body Bathing: Edge of bed Lower Body Bathing: Moderate assistance Where Assessed-Lower Body Bathing: Edge of bed Upper Body Dressing: Minimal assistance Where Assessed-Upper Body Dressing: Edge of bed Lower Body Dressing: Maximal assistance Where Assessed-Lower Body Dressing: Edge of bed Toileting: Dependent (using Stedy) Where Assessed-Toileting: Bedside Commode Toilet Transfer: Dependent Toilet Transfer Method: Other (comment) Charlaine Dalton) Toilet Transfer Equipment: Radiographer, therapeutic: Not assessed      Therapy/Group: Individual Therapy  Larue Lightner A Adeeb Konecny 05/07/2020, 3:48 PM

## 2020-05-07 NOTE — Progress Notes (Signed)
Physical Therapy Session Note  Patient Details  Name: Johnathan Barker MRN: [id number] Date of Birth: Oct 19, 1941  Today's Date: 05/07/2020 PT Individual Time: 1048-1130 PT Individual Time Calculation (min): 42 min   Short Term Goals: Week 2:  PT Short Term Goal 1 (Week 2): Pt will transfer bed<>chair with LRAD min A PT Short Term Goal 2 (Week 2): Pt will ambulate 74ft with LRAD mod A PT Short Term Goal 3 (Week 2): Pt will perform simulated car transfer with LRAD min A  Skilled Therapeutic Interventions/Progress Updates: Pt presents sitting in w/c and agreeable to therapy.  Pt very confused asking to donate to Rehab unit.  Spouse present.  Pt transfers sit to stand w/ min A and verbal cues for initiation, especially forward lean.  Pt performed multiple SPT w/c <> Nu-step, mat table, arm chair w/ min A.  Pt performed Nu-step at level 4 x 5' w/ all 4 extremities, 3' w/ LEs only.  Pt performed standing marching w/ decreased BOS and foot placement, 3 x 10.  Pt amb w/ RW and min A up to 50' including turns to return to w/c.  Pt requires verbal cues for posture and maintaining BOS, especially w/ turns.  Pt returned to room and remained in w/c w/ alarm on and all needs in reach.  Spouse present.     Therapy Documentation Precautions:  Precautions Precautions: Cervical, Back, Fall Precaution Comments: no brace needed Restrictions Weight Bearing Restrictions: No General:   Vital Signs:   Pain:no c/o pain. Pain Assessment Pain Scale: Faces Faces Pain Scale: No hurt Mobility:   Other Treatments:      Therapy/Group: Individual Therapy  Ladoris Gene 05/07/2020, 11:34 AM

## 2020-05-07 NOTE — Progress Notes (Signed)
Nutrition Follow-up  DOCUMENTATION CODES:   Not applicable  INTERVENTION:   Continue tube feeding via Cortrak: - Glucerna 1.5 @ 65 ml/hr (can be held for up to 4 hours for therapies) - Pro-stat 30 ml daily - Free water per MD/PA  Tube feeding regimen and free water provides 2050 kcal, 122 grams of protein, and 987 ml of H2O.   NUTRITION DIAGNOSIS:   Inadequate oral intake related to dysphagia as evidenced by NPO status.  Ongoing, being addressed via TF  GOAL:   Patient will meet greater than or equal to 90% of their needs  Met via TF  MONITOR:   Diet advancement, Labs, Weight trends, TF tolerance, Skin, I & O's  REASON FOR ASSESSMENT:   Consult Enteral/tube feeding initiation and management  ASSESSMENT:   79-year-old male with PMH of HTN, T2DM, BLE weakness with difficulty walking over past couple of months with frequent falls. Admitted on 04/24/20 with lower extremity weakness and feeling cold. 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  Also found to have right thyroid nodule with recommendations for thyroid ultrasound. On 04/26/20 pt underwent ACDF of C3/C4 and decompressive thoracic laminectomy/medial facetectomy. Admitted to CIR on 04/27/20.  6/05 - concern for aspiration, pt made NPO, NGT placed 6/07 - pt removed NGT overnight, Cortrak placed (tip gastric per Cortrak team) x 2 6/08 - MBSS with recommendations for NPO  Noted target d/c date of 05/15/20.  Pt on free water protocol.  Spoke with pt's son and pt's wife. Pt is confused this afternoon. [name] and wife with questions regarding when pt will be able to eat again. Encouraged family to reach out to SLP. Explained that pt is meeting his nutritional needs via tube feeding.  Medications reviewed and include: decadron,  pepcid, SSI q 4 hours, Novolog 5 units q 4 hours. Lantus 14 units daily, IV abx  Labs reviewed. CBG's: 176-252 x 24 hours  Diet Order:   Diet Order            Diet NPO time specified  Diet effective now                 EDUCATION NEEDS:   Education needs have been addressed  Skin:  Skin Assessment: Skin Integrity Issues: Incisions: neck, back  Last BM:  05/07/20 medium type 6  Height:   Ht Readings from Last 1 Encounters:  04/27/20 6' (1.829 m)    Weight:   Wt Readings from Last 1 Encounters:  04/27/20 92.5 kg    BMI:  Body mass index is 27.66 kg/m.  Estimated Nutritional Needs:   Kcal:  2100-2300  Protein:  110-125 grams  Fluid:  >/= 2.0 L    Kate Jablonski Holmes, MS, RD, LDN Inpatient Clinical Dietitian Pager: [phone] Weekend/After Hours: [phone]  

## 2020-05-07 NOTE — Plan of Care (Signed)
°  Problem: Consults Goal: RH SPINAL CORD INJURY PATIENT EDUCATION Description:  See Patient Education module for education specifics.  Outcome: Progressing   Problem: SCI BOWEL ELIMINATION Goal: RH STG MANAGE BOWEL WITH ASSISTANCE Description: STG Manage Bowel with Min Assistance. Outcome: Progressing Goal: RH STG SCI MANAGE BOWEL WITH MEDICATION WITH ASSISTANCE Description: STG SCI Manage bowel with medication with Min assistance. Outcome: Progressing   Problem: SCI BLADDER ELIMINATION Goal: RH STG MANAGE BLADDER WITH ASSISTANCE Description: STG Manage Bladder With Min Assistance Outcome: Progressing   Problem: RH SKIN INTEGRITY Goal: RH STG SKIN FREE OF INFECTION/BREAKDOWN Description: No new breakdown with min assist  Outcome: Progressing Goal: RH STG ABLE TO PERFORM INCISION/WOUND CARE W/ASSISTANCE Description: STG Able To Perform Incision/Wound Care With [employer]. Outcome: Progressing   Problem: RH SAFETY Goal: RH STG ADHERE TO SAFETY PRECAUTIONS W/ASSISTANCE/DEVICE Description: STG Adhere to Safety Precautions With Min Assistance/Device. Outcome: Progressing   Problem: RH PAIN MANAGEMENT Goal: RH STG PAIN MANAGED AT OR BELOW PT'S PAIN GOAL Description: Pt will manage pain at 3 or less on a scale of 0-10.  Outcome: Progressing   Problem: RH KNOWLEDGE DEFICIT SCI Goal: RH STG INCREASE KNOWLEDGE OF SELF CARE AFTER SCI Description: Pt will verbalize increased knowledge of caring for self after SCI including pain management, precautions, and wound care with min assist using educational materials provided by staff.  Outcome: Progressing

## 2020-05-07 NOTE — Progress Notes (Signed)
Speech Language Pathology Daily Session Note  Patient Details  Name: Johnathan Barker MRN: [id number] Date of Birth: [birth year]  Today's Date: 05/07/2020 SLP Individual Time: 1135-1205 SLP Individual Time Calculation (min): 30 min  Short Term Goals: Week 2: SLP Short Term Goal 1 (Week 2): Pt will consume therapeutic trials of ice chips and/or puree textures with minimal overt s/sx aspiration and Min A for use of swallow strategies. SLP Short Term Goal 2 (Week 2): Pt will demonstrate carryover and recall of new and/or daily information with Mod A for use of memory strateiges/aids. SLP Short Term Goal 3 (Week 2): Pt will sustain attention to functional tasks for 10 minute intervals with Min a cues for redirection. SLP Short Term Goal 4 (Week 2): Pt will demonstrate ability to problem solving basic familiar situations with Mod A verbal/visual cues. SLP Short Term Goal 5 (Week 2): Pt will detect functional errors with Mod A verbal/visual cues.  Skilled Therapeutic Interventions: Skilled ST services focused on swallow and cognitive skills. Pt completed oral care via suction toothbrush, pt demonstrated ability to brush lower dentition mod I, missing the postorier upper dentition, however pt demonstrated carryover with return demonstration. Pt required max A verbal/visual cues fading to mod A verbal/visual cues to recall and utiliize swallow strategies when consuming trials of thin via straw. Pt demonstrated increase carryover of strategies once SLP removed the cup from pt's hand following initial swallow. Pt consumed 6oz of thin liquid via straw with x1 delayed cough. SLP also facilitated basic problem solving, error awareness and sustained attention in basic PEG task, pt demonstrated ability to carryover patten following initial mod A verbal cues fading to min A verbal cues. Pt demonstrated sustained attention in 10 minute interval with min A verbal cues for redirection.SLP communicated with pt and wife  about repeat MBS this week to assess swallow function. Pt was left in room with wife and chair alarm set.     Pain Pain Assessment Pain Score: 0-No pain  Therapy/Group: Individual Therapy  Shaul Trautman  [facility] 05/07/2020, 4:23 PM

## 2020-05-07 NOTE — Progress Notes (Signed)
[place] PHYSICAL MEDICINE & REHABILITATION PROGRESS NOTE  Subjective/Complaints: Patient seen laying in bed this morning.  He states he slept well overnight.  He states he had a "quiet weekend".  No reported issues overnight.  ROS: Limited due to cognition  Objective: Vital Signs: Blood pressure 121/64, pulse 70, temperature 98.1 F (36.7 C), temperature source Oral, resp. rate 16, height 6' (1.829 m), weight 92.5 kg, SpO2 97 %. No results found. Recent Labs    05/07/20 0701  WBC 20.9*  HGB 14.8  HCT 44.9  PLT 357   Recent Labs    05/07/20 0701  NA 140  K 4.6  CL 102  CO2 30  GLUCOSE 219*  BUN 28*  CREATININE 0.93  CALCIUM 8.8*    Physical Exam: BP 121/64 (BP Location: Left Arm)   Pulse 70   Temp 98.1 F (36.7 C) (Oral)   Resp 16   Ht 6' (1.829 m)   Wt 92.5 kg   SpO2 97%   BMI 27.66 kg/m   Constitutional: No distress . Vital signs reviewed. HENT: Normocephalic.  Atraumatic.  + NG. Eyes: EOMI. No discharge. Cardiovascular: No JVD. Respiratory: Normal effort.  No stridor. GI: Non-distended. Skin: Incision with edema C/D/I Psych: Confused. Musc: No edema in extremities.  No tenderness in extremities. Neurological: Alert Motor: 4+-5/5 throughout, unchanged  Assessment/Plan: 1. 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  Physiatrist is providing close team supervision and 24 hour management of active medical problems listed below.  Physiatrist and rehab team continue to assess barriers to discharge/monitor patient progress toward functional and medical goals  Care Tool:  Bathing    Body parts bathed by patient: Right arm, Left arm, Chest, Abdomen, Face, Front perineal area, Right upper leg, Left upper leg   Body parts bathed by helper: Buttocks     Bathing assist Assist Level: Minimal Assistance - Patient > 75%      Upper Body Dressing/Undressing Upper body dressing   What is the patient wearing?: Pull over shirt    Upper body assist Assist Level: Contact Guard/Touching assist    Lower Body Dressing/Undressing Lower body dressing      What is the patient wearing?: Incontinence brief, Pants     Lower body assist Assist for lower body dressing: Moderate Assistance - Patient 50 - 74%     Toileting Toileting Toileting Activity did not occur Landscape architect and hygiene only): Refused  Toileting assist Assist for toileting: Maximal Assistance - Patient 25 - 49%     Transfers Chair/bed transfer  Transfers assist     Chair/bed transfer assist level: Moderate Assistance - Patient 50 - 74%     Locomotion Ambulation   Ambulation assist      Assist level: 2 helpers Assistive device: Walker-rolling Max distance: 39ft   Walk 10 feet activity   Assist  Walk 10 feet activity did not occur: Safety/medical concerns  Assist level: 2 helpers Assistive device: Walker-rolling   Walk 50 feet activity   Assist Walk 50 feet with 2 turns activity did not occur: Safety/medical concerns  Assist level: 2 helpers Assistive device: Walker-rolling    Walk 150 feet activity   Assist Walk 150 feet activity did not occur: Safety/medical concerns         Walk 10 feet on uneven surface  activity   Assist Walk 10 feet on uneven surfaces activity did not occur: Safety/medical concerns  Wheelchair     Assist Will patient use wheelchair at discharge?:  (TBD) Type of Wheelchair: Manual Wheelchair activity did not occur: Safety/medical concerns  Wheelchair assist level: Supervision/Verbal cueing Max wheelchair distance: 172ft    Wheelchair 50 feet with 2 turns activity    Assist    Wheelchair 50 feet with 2 turns activity did not occur: Safety/medical concerns   Assist Level: Supervision/Verbal cueing   Wheelchair 150 feet activity     Assist  Wheelchair 150 feet activity did not occur: Safety/medical concerns   Assist Level: Supervision/Verbal cueing      Medical Problem List and Plan: 1.  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  Continue CIR  IV Steroids started on 6/5, transition to p.o. on 6/10, decreased on 6/11, decreased presented 6/13 2.  Antithrombotics: -DVT/anticoagulation:  Mechanical: Sequential compression devices, below knee Bilateral lower extremities             -antiplatelet therapy: N/A 3. Pain Management: Tylenol prn. Does not like Oxycodone.  Ordered ultram prn  Appears controlled on 6/14  Monitor with increased exertion as well as cognition side effects 4. Mood: LCSW to follow for evaluation and support.              -antipsychotic agents: N/A 5. Neuropsych: This patient is not fully capable of making decisions on his own behalf. 6. Skin/Wound Care: Monitor wound for healing.  7. Fluids/Electrolytes/Nutrition: Encourage fluid intake. Monitor I/O.  8. HTN: Monitor BP -continue amlodipine, Irbesartan, and metoprolol.   Controlled on 6/14  Monitor with increased mobility 9.  Steroid-induced hyperglycemia on T2DM with hyperglycemia: Hgb A1c- 7.5. Monitor BS ac/hs. Used amaryl 4 mg bid and lantus 2 units HS.   Amaryl on hold at present   Lantus 10, increased to 14 on 6/13  Novolog 5 with meals and at bedtime  Remains elevated on 6/14, will need to monitor closely with steroid wean 10. Leucocytosis: Likely secondary to steroids + PNA.               WBC 20.9 on 6/14, labs ordered for tomorrow  Afebrile  Continue to monitor 11. Constipation:              Adjust bowel meds as necessary 12. Hyperbilirubinemia:   Elevated on 6/5, continue to monitor 13. Pharyngeal dysphagia: ST to follow for evaluation.   N.p.o. with NG at present after MBS, water protocol  Continue tube feeds  DC IVF  Advance diet as  tolerated 14.  Urinary retention             Improving 15.  Sepsis/aspiration pneumonia, associated delirium. Currently afebrile  Bcx + x 2 for staph hominis, discussed with pharmacy  Initial CXR's have demonstrated right lower lobe consolidation, repeat chest x-ray personally reviewed, showing same  Continue IV cefepime  IV Flagyl DC'd after discussion with pharmacy  Repeat blood cultures no growth to date on 6/11  Echo reviewed, no signs of vegetation, EF 55-60% 16.  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

## 2020-05-07 NOTE — Progress Notes (Signed)
Physical Therapy Session Note  Patient Details  Name: Johnathan Barker MRN: [id number] Date of Birth: 06-06-1941  Today's Date: 05/07/2020 PT Individual Time: 0900-1013 PT Individual Time Calculation (min): 73 min   Short Term Goals: Week 1:  PT Short Term Goal 1 (Week 1): Pt will complete least restrictive transfer with min A consistently PT Short Term Goal 1 - Progress (Week 1): Progressing toward goal PT Short Term Goal 2 (Week 1): Pt will ambulate x 50 ft with LRAD and mod A PT Short Term Goal 2 - Progress (Week 1): Progressing toward goal PT Short Term Goal 3 (Week 1): Pt will maintain dynamic standing balance with min A x 5 min PT Short Term Goal 3 - Progress (Week 1): Progressing toward goal Week 2:  PT Short Term Goal 1 (Week 2): Pt will transfer bed<>chair with LRAD min A PT Short Term Goal 2 (Week 2): Pt will ambulate 72ft with LRAD mod A PT Short Term Goal 3 (Week 2): Pt will perform simulated car transfer with LRAD min A  Skilled Therapeutic Interventions/Progress Updates:   Received pt supine in bed with RN and NT present assisting with toileting; PT took over with care, pt agreeable to therapy, and denied any pain during session. Session with emphasis on toileting, dressing, functional mobility/transfers, generalized strengthening, dynamic standing balance/coordinaiton, ambulation, motor control/coordination, and improved activity tolerance. Pt remains confused and very verbose with constant jabbering throughout session. Doffed bilateral wrist restraints with total A and pt transferred supine<>sitting EOB with supervision with HOB elevated and cues for logroll technique. Pt transferred sit<>stand mod A without AD and pt able to void in urinal while standing with RN while PT provided mod A to maintain static standing balance. Pt initially fearful upon standing and required verbal encouragement and cues for upright posture and deep breathing to relax to void. Stand<>sit CGA and  sit<>supine with supervision. Pt rolled to L and R with supervision/CGA and use of bedrails x 2 trials to don clean brief max A. Pt donned pants in supine with mod A with cues for sequencing as pt frequently slamming LEs into bed and lifting them excessively high in air when attempting to don pants. Doffed non-skid socks and donned clean ones total A and transferred supine<>sitting EOB with supervision and same cues mentioned above and donned shoes max A and doffed dirty undershirt and donned clean undershirt and pull over shirt with min/mod A. Pt transferred bed<>WC stand<>pivot without AD mod A with cues for hand placement and turning technique. Pt combed hair seated in WC at sink with supervision and performed WC mobility 188ft using bilateral LEs and supervision to therapy gym. Pt ambulated 23ft with RW mod A +2 for WC follow. Pt demonstrated narrow BOS, flexed trunk, and LE ataxia R>L requiring max cues to increase BOS for safety and to maintain upright posture. Pt transported to ortho gym in [facility] total A and performed simulated car transfer with mod A and cues for hand placement on WC and car, turning technique, safety awareness, and stepping sequence as pt forgetting to pick up LEs when turning to sit. Pt transferred stand<>pivot without AD mod A x 2 additional trials throughout session with same cues mentioned for technique. Pt transferred sit<>stand 2x10 reps min A with bilateral UE support on mat. Pt required cues for anterior weight shifting, upright posture, and eccentric control when sitting. While sitting on mat worked on LE Optometrist and reaction time kicking soccer ball x 8 reps. Pt with  LE ataxia R>L and frequently demonstrating delayed reaction time and over/undershooting when kicking. Pt transported back to room in [facility] total A. Concluded session with pt sitting in WC, seatbelt alarm on, and needs within reach. Wrist restraints not on but pt's wife present at bedside.   Therapy  Documentation Precautions:  Precautions Precautions: Cervical, Back, Fall Precaution Comments: no brace needed Restrictions Weight Bearing Restrictions: No  Therapy/Group: Individual Therapy [name] PT, DPT   05/07/2020, 7:21 AM

## 2020-05-08 ENCOUNTER — Inpatient Hospital Stay (HOSPITAL_COMMUNITY): Payer: Medicare Other

## 2020-05-08 ENCOUNTER — Inpatient Hospital Stay (HOSPITAL_COMMUNITY): Payer: Medicare Other | Admitting: Occupational Therapy

## 2020-05-08 ENCOUNTER — Encounter (HOSPITAL_COMMUNITY): Payer: Medicare Other | Admitting: Psychology

## 2020-05-08 LAB — URINALYSIS, COMPLETE (UACMP) WITH MICROSCOPIC
Bacteria, UA: NONE SEEN
Bilirubin Urine: NEGATIVE
Glucose, UA: 150 mg/dL — AB
Hgb urine dipstick: NEGATIVE
Ketones, ur: NEGATIVE mg/dL
Leukocytes,Ua: NEGATIVE
Nitrite: NEGATIVE
Protein, ur: 30 mg/dL — AB
Specific Gravity, Urine: 1.02 (ref 1.005–1.030)
pH: 7 (ref 5.0–8.0)

## 2020-05-08 LAB — CBC WITH DIFFERENTIAL/PLATELET
Abs Immature Granulocytes: 0.13 10*3/uL — ABNORMAL HIGH (ref 0.00–0.07)
Basophils Absolute: 0.1 10*3/uL (ref 0.0–0.1)
Basophils Relative: 0 %
Eosinophils Absolute: 0.3 10*3/uL (ref 0.0–0.5)
Eosinophils Relative: 1 %
HCT: 41.4 % (ref 39.0–52.0)
Hemoglobin: 13.6 g/dL (ref 13.0–17.0)
Immature Granulocytes: 1 %
Lymphocytes Relative: 10 %
Lymphs Abs: 2.2 10*3/uL (ref 0.7–4.0)
MCH: 30 pg (ref 26.0–34.0)
MCHC: 32.9 g/dL (ref 30.0–36.0)
MCV: 91.2 fL (ref 80.0–100.0)
Monocytes Absolute: 1.5 10*3/uL — ABNORMAL HIGH (ref 0.1–1.0)
Monocytes Relative: 7 %
Neutro Abs: 16.9 10*3/uL — ABNORMAL HIGH (ref 1.7–7.7)
Neutrophils Relative %: 81 %
Platelets: 310 10*3/uL (ref 150–400)
RBC: 4.54 MIL/uL (ref 4.22–5.81)
RDW: 12.9 % (ref 11.5–15.5)
WBC: 21.1 10*3/uL — ABNORMAL HIGH (ref 4.0–10.5)
nRBC: 0 % (ref 0.0–0.2)

## 2020-05-08 LAB — GLUCOSE, CAPILLARY
Glucose-Capillary: 180 mg/dL — ABNORMAL HIGH (ref 70–99)
Glucose-Capillary: 217 mg/dL — ABNORMAL HIGH (ref 70–99)
Glucose-Capillary: 227 mg/dL — ABNORMAL HIGH (ref 70–99)
Glucose-Capillary: 236 mg/dL — ABNORMAL HIGH (ref 70–99)
Glucose-Capillary: 241 mg/dL — ABNORMAL HIGH (ref 70–99)

## 2020-05-08 LAB — PROCALCITONIN: Procalcitonin: <0.1 ng/mL

## 2020-05-08 IMAGING — DX DG CHEST 2V
3 series · 3 of 3 positions shown · non-contrast
Comparison: [DATE] and [DATE]

CLINICAL DATA: Hypertension and cardiac arrhythmia

EXAM:
CHEST - 2 VIEW

[w chest lat (1 of 2)]
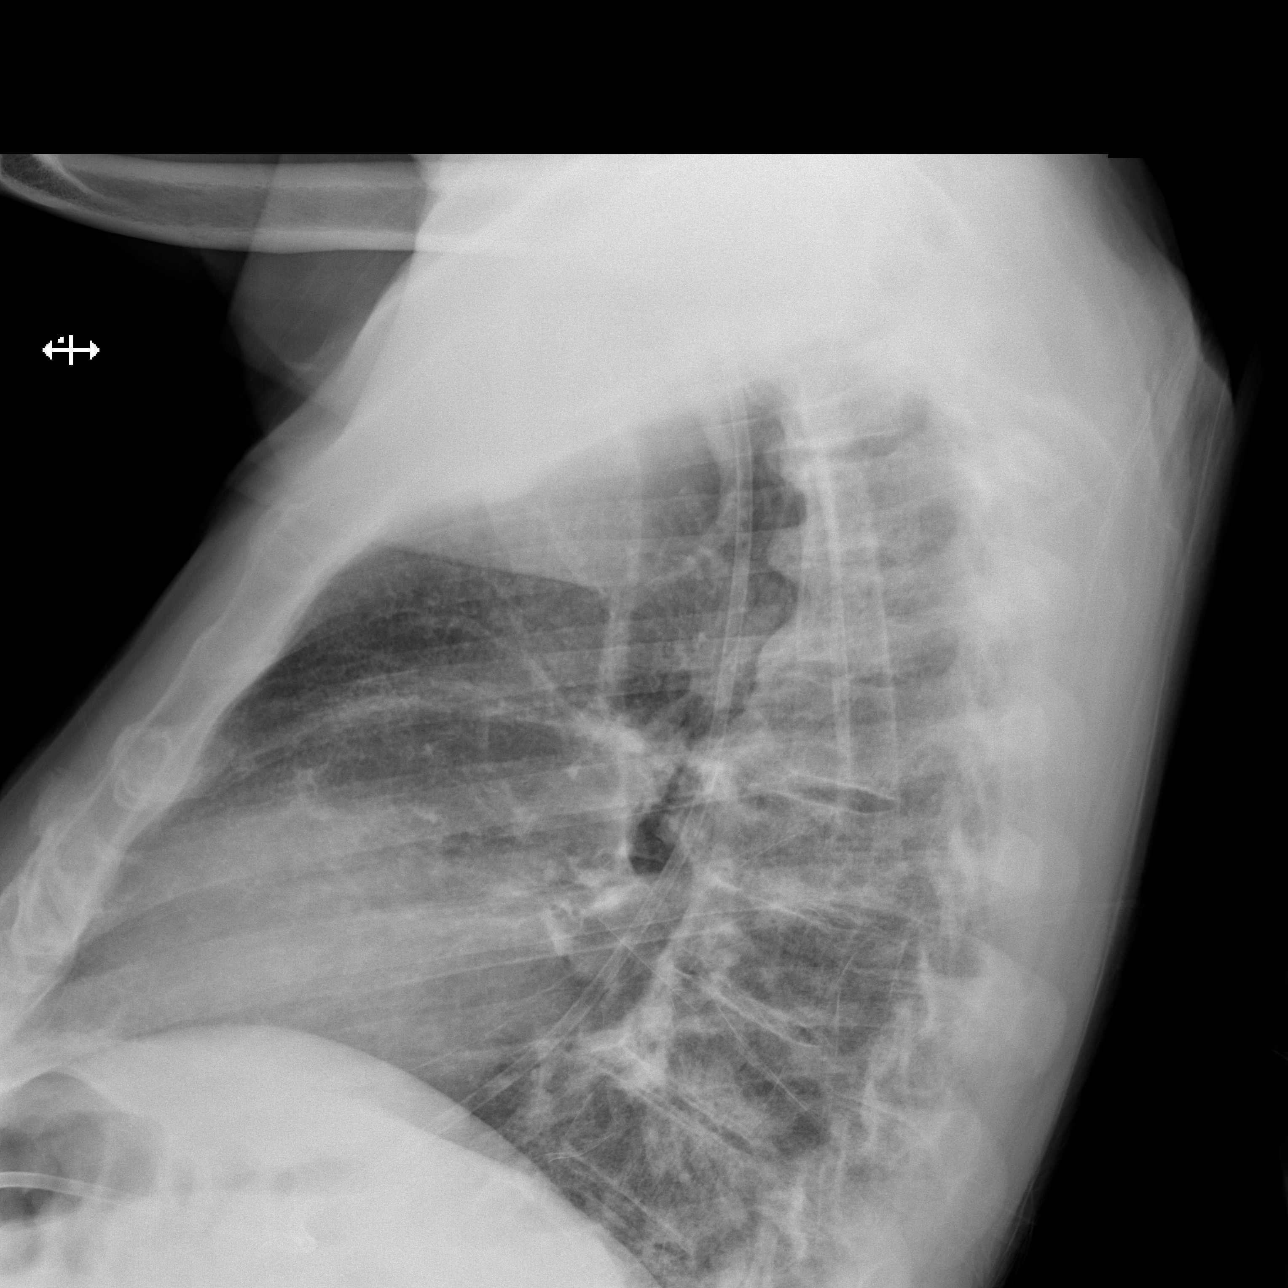

[w chest lat (2 of 2)]
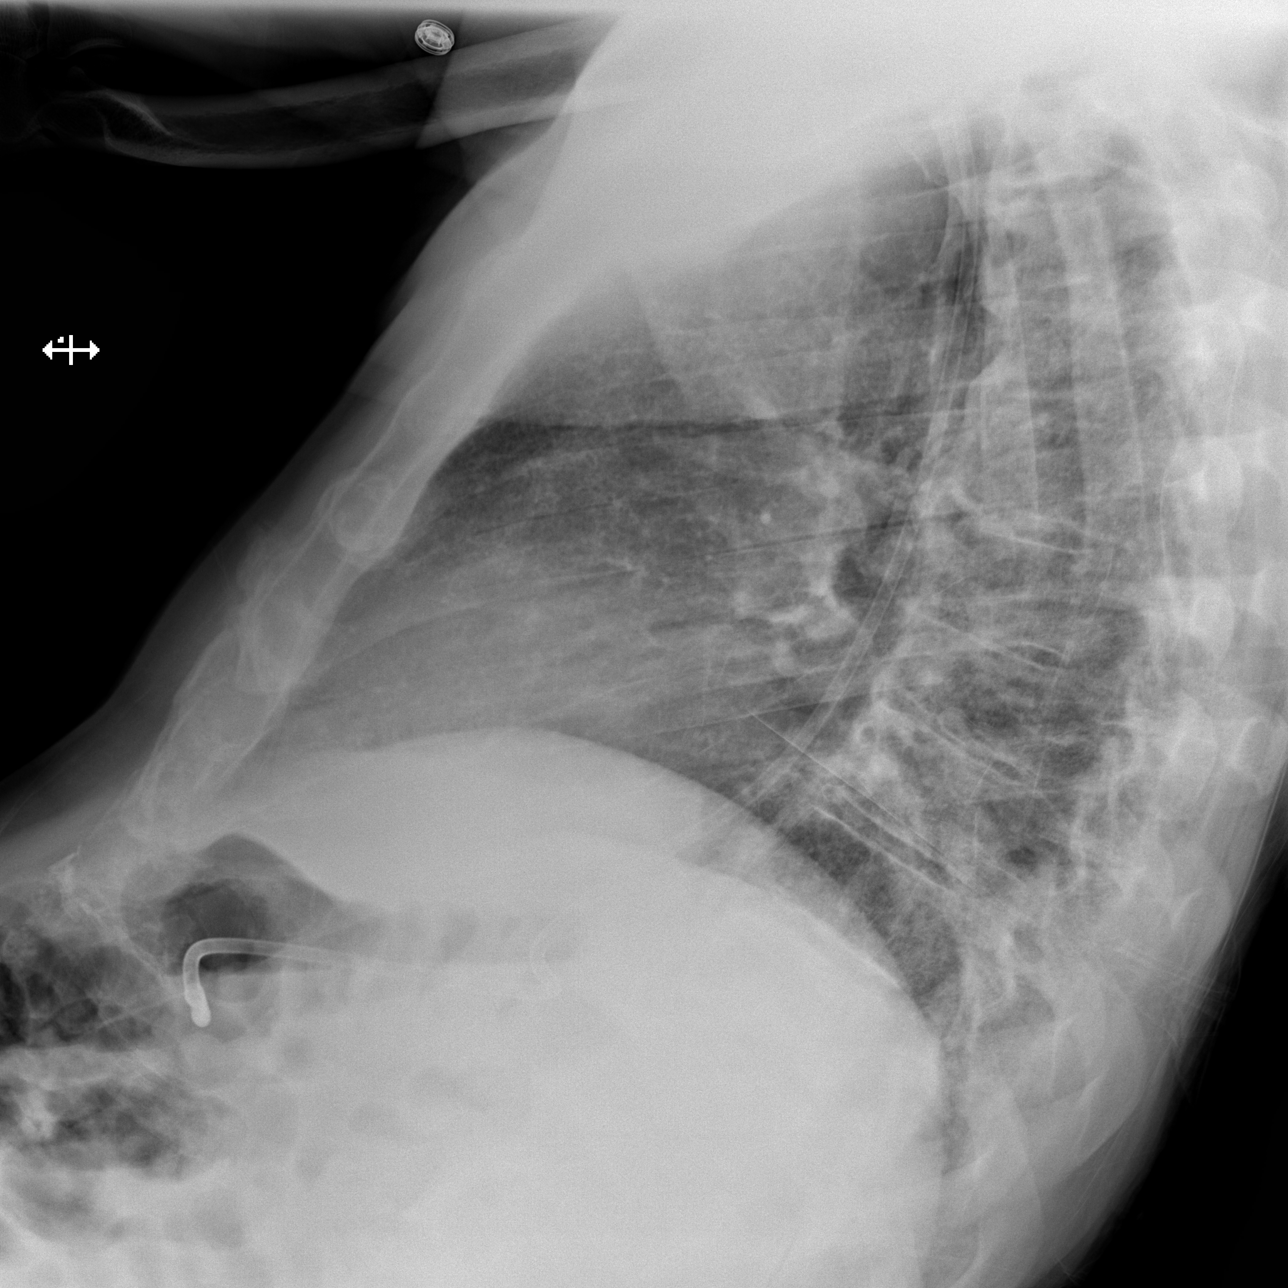

[x chest ap]
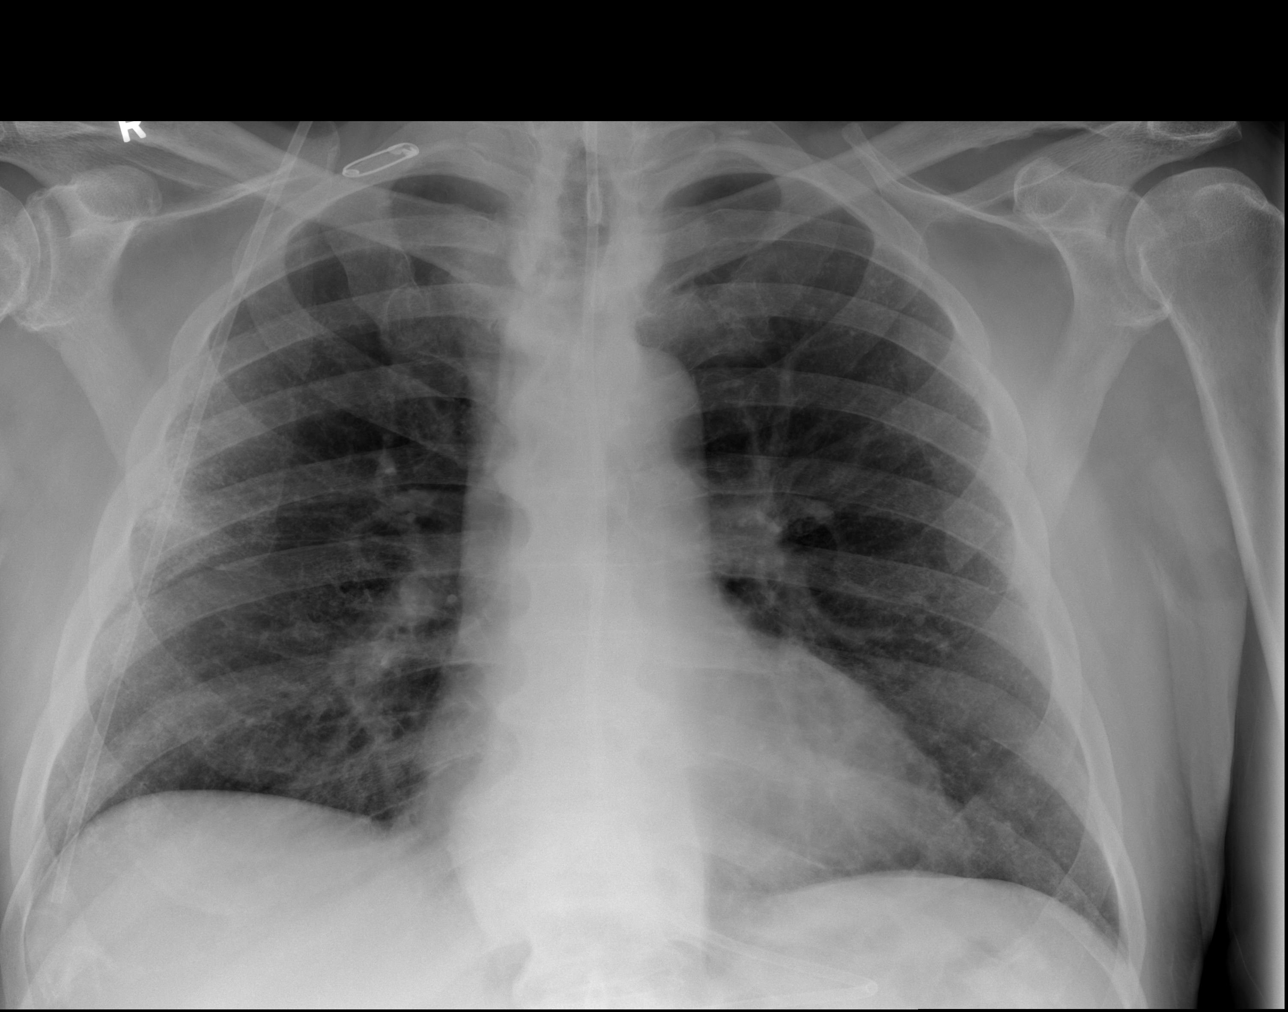

[3 of 3 positions shown; findings below may reference images not displayed]

FINDINGS: Right base is now clear. There is suspected mild fibrosis in the
lung bases. There is new ill-defined opacity in the periphery of the
right mid lung. Heart size and pulmonary vascularity are normal. No
adenopathy. Feeding tube extends below the diaphragm. There is
degenerative change in the thoracic spine.
IMPRESSION: Airspace opacity in the periphery of the right mid lung concerning
for developing pneumonia or possible aspiration. Mild fibrosis in
the bases. Lungs elsewhere clear. Cardiac silhouette normal. No
adenopathy.

## 2020-05-08 IMAGING — CT CT CHEST HIGH RESOLUTION W/O CM
2 of 6 series · 14 of 36 positions shown, 17 images · non-contrast
Comparison: No priors.

CLINICAL DATA: 78-year-old male with history of interstitial lung
disease.

EXAM:
CT CHEST WITHOUT CONTRAST
TECHNIQUE: Multidetector CT imaging of the chest was performed following the
standard protocol without intravenous contrast. High resolution
imaging of the lungs, as well as inspiratory and expiratory imaging,
was performed.

[Series 7: high resolution thins · axial · 0.91mm/px · z∈[+544,+795]mm · 11 of 433 slices shown, 14 images]
[im 23/433  mediastinal]
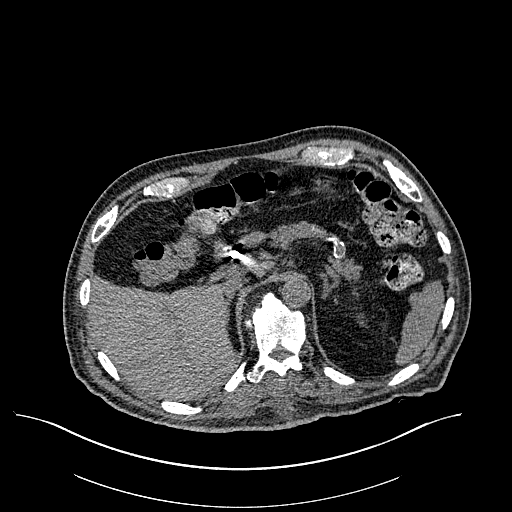
[im 23/433  lung]
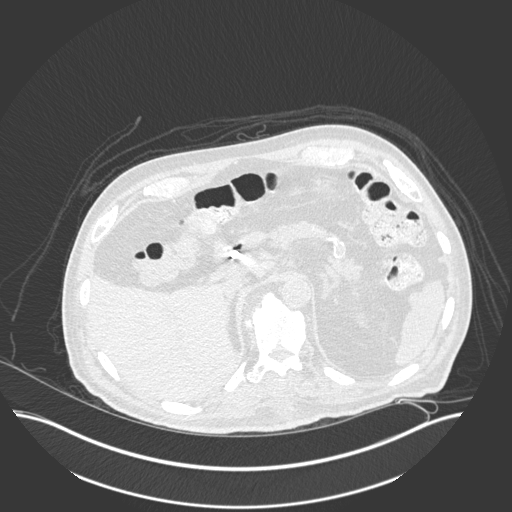
[im 69/433  lung]
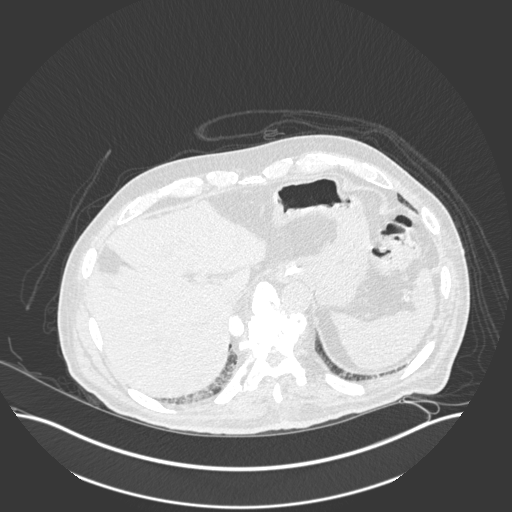
[im 114/433  lung]
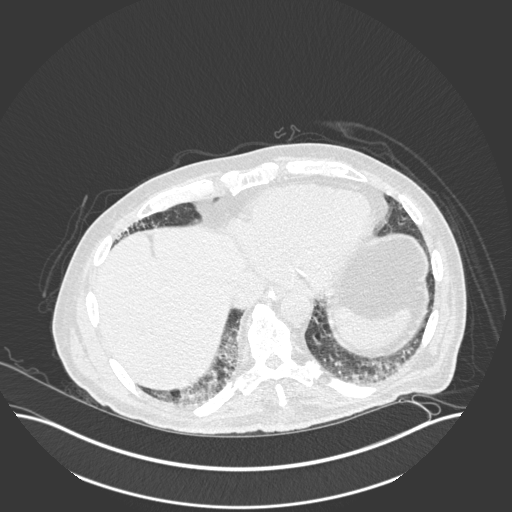
[im 137/433  lung]
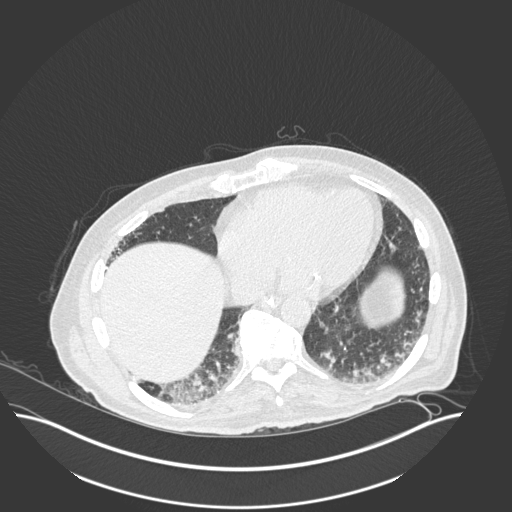
[im 182/433  mediastinal]
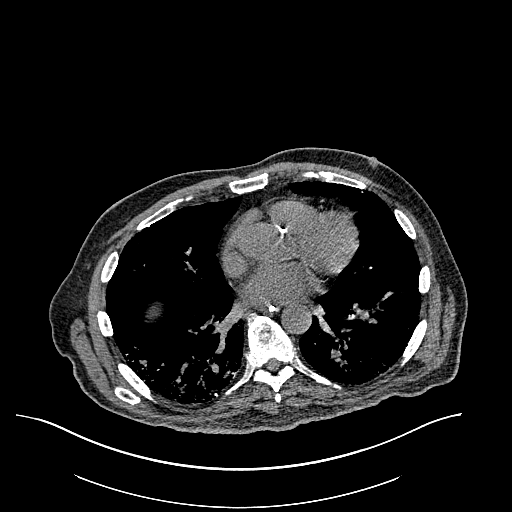
[im 182/433  lung]
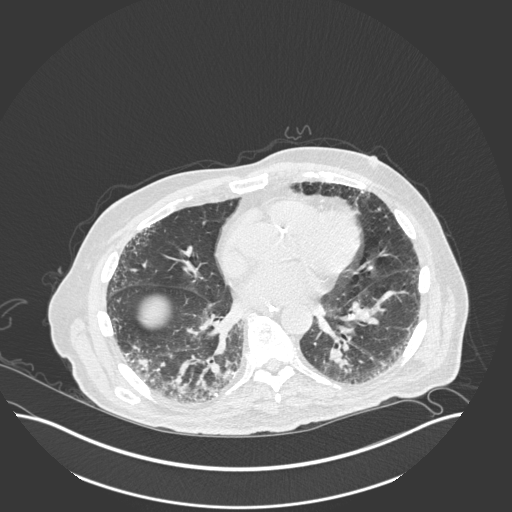
[im 228/433  lung]
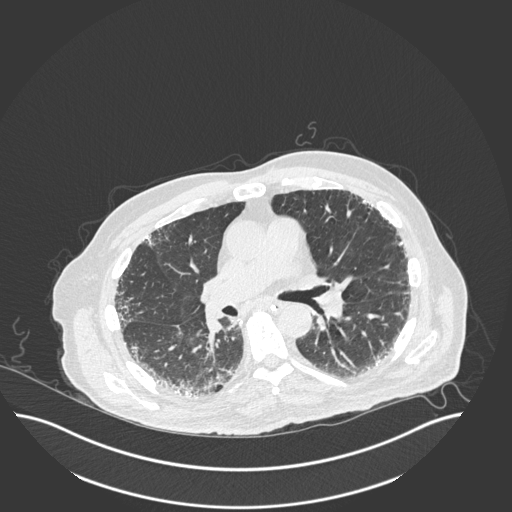
[im 251/433  lung]
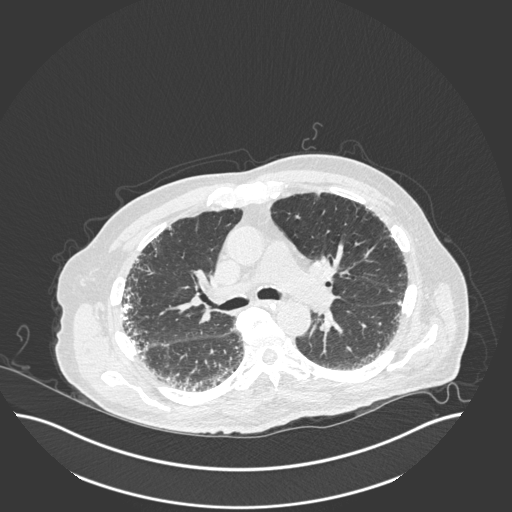
[im 296/433  lung]
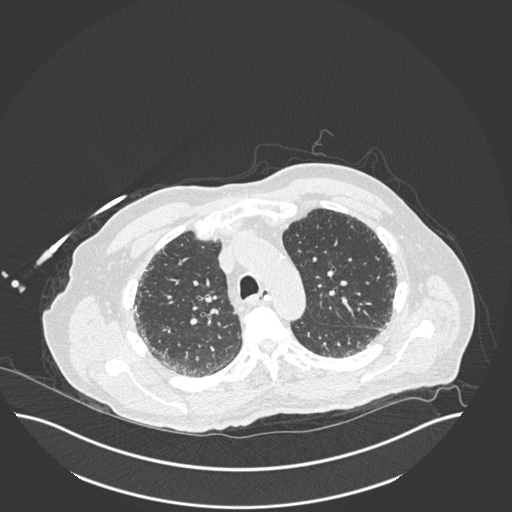
[im 319/433  mediastinal]
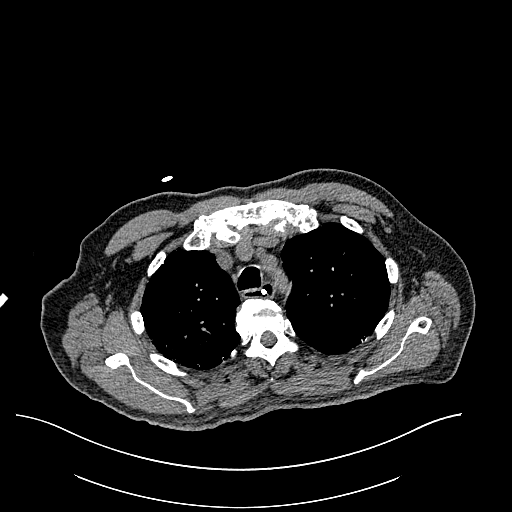
[im 319/433  lung]
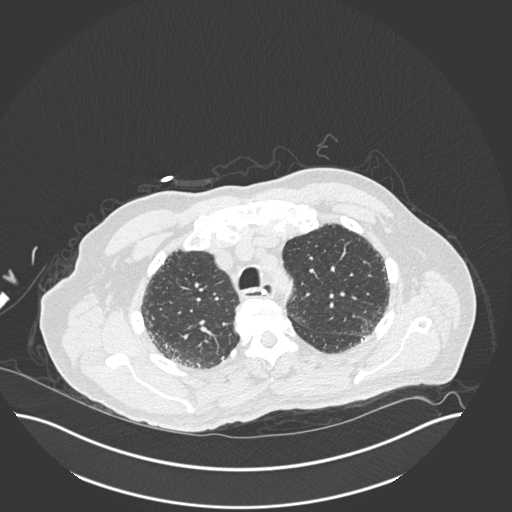
[im 364/433  lung]
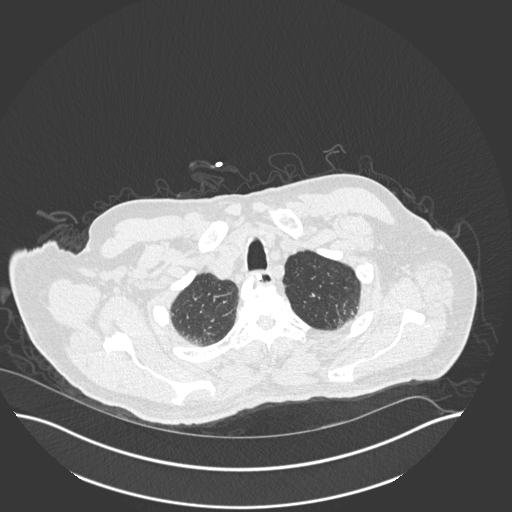
[im 410/433  lung]
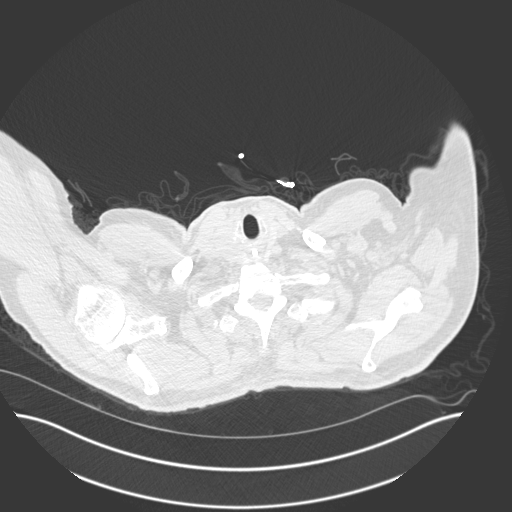

[Series 8: coronal · coronal · 0.57mm/px · 3 of 151 slices shown]
[im 31/151  lung]
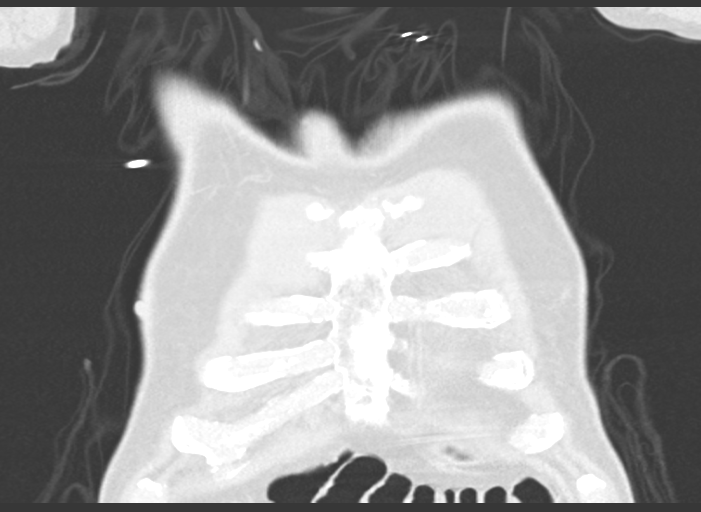
[im 61/151  lung]
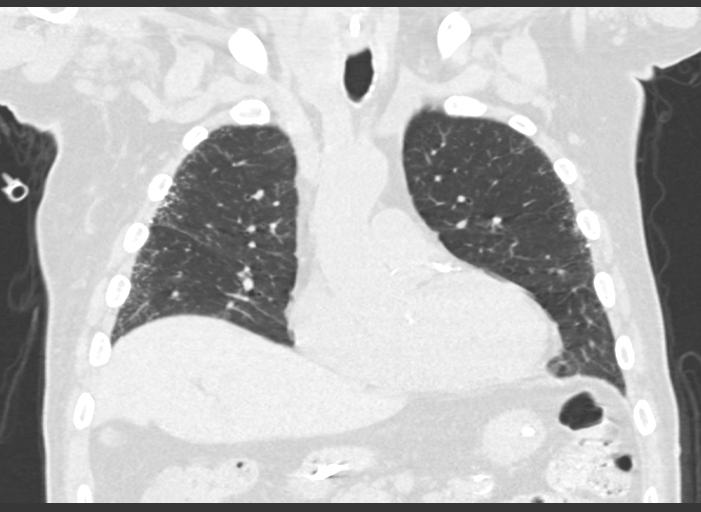
[im 91/151  lung]
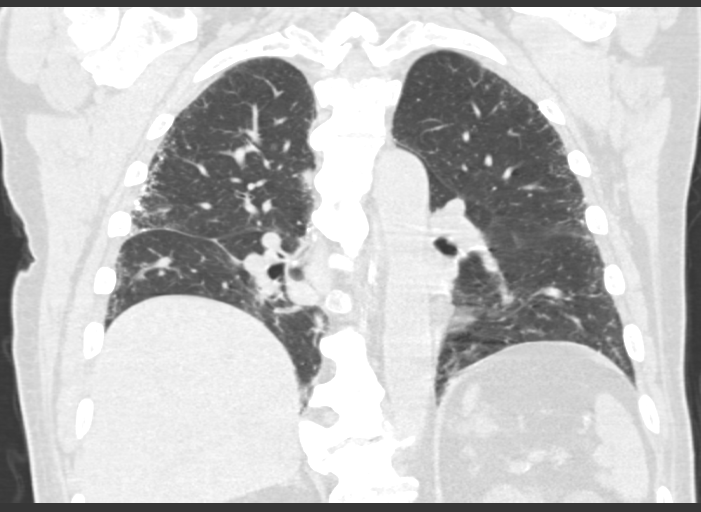

[14 of 36 positions shown; findings below may reference images not displayed]

FINDINGS: Cardiovascular: Heart size is normal. There is no significant
pericardial fluid, thickening or pericardial calcification. There is
aortic atherosclerosis, as well as atherosclerosis of the great
vessels of the mediastinum and the coronary arteries, including
calcified atherosclerotic plaque in the left main, left anterior
descending, left circumflex and right coronary arteries.
Calcifications of the aortic valve and mitral annulus.

Mediastinum/Nodes: No pathologically enlarged mediastinal or hilar
lymph nodes. Please note that accurate exclusion of hilar adenopathy
is limited on noncontrast CT scans. Feeding tube present extending
into the stomach. Esophagus is otherwise unremarkable in appearance.
No axillary lymphadenopathy.

Lungs/Pleura: High-resolution images demonstrate widespread areas of
peripheral predominant ground-glass attenuation, septal thickening
and subpleural reticulation in the lungs bilaterally. These findings
have a mild craniocaudal gradient. No traction bronchiectasis or
frank honeycombing. Inspiratory and expiratory imaging is
unremarkable. No acute consolidative airspace disease. No pleural
effusions. No definite suspicious appearing pulmonary nodules or
masses are noted.

Upper Abdomen: Several faintly calcified small are subcentimeter
gallstones lying dependently in the gallbladder.

Musculoskeletal: There are no aggressive appearing lytic or blastic
lesions noted in the visualized portions of the skeleton.
IMPRESSION: 1. The appearance of the lungs is compatible with interstitial lung
disease, but the spectrum of findings is considered indeterminate
for usual interstitial pneumonia (UIP) per current ats guidelines.
Repeat high-resolution chest CT is suggested in 12 months to assess
for temporal changes in the appearance of the lung parenchyma.
2. Aortic atherosclerosis, in addition to left main and 3 vessel
coronary artery disease. Assessment for potential risk factor
modification, dietary therapy or pharmacologic therapy may be
warranted, if clinically indicated.
3. There are calcifications of the aortic valve and mitral annulus.
Echocardiographic correlation for evaluation of potential valvular
dysfunction may be warranted if clinically indicated.
4. Cholelithiasis without evidence of acute cholecystitis at this
time.

Aortic Atherosclerosis ([PC]-[PC]).

## 2020-05-08 MED ORDER — FLUTICASONE PROPIONATE 50 MCG/ACT NA SUSP
1.0000 | Freq: Two times a day (BID) | NASAL | Status: DC
  Administered 2020-05-08 – 2020-05-18 (×12): 1 via NASAL
  Filled 2020-05-08: qty 16

## 2020-05-08 MED ORDER — PANTOPRAZOLE SODIUM 40 MG PO PACK
40.0000 mg | PACK | Freq: Two times a day (BID) | ORAL | Status: DC
  Administered 2020-05-08 – 2020-05-18 (×20): 40 mg
  Filled 2020-05-08 (×14): qty 20

## 2020-05-08 NOTE — Plan of Care (Signed)
  Problem: Consults Goal: RH SPINAL CORD INJURY PATIENT EDUCATION Description:  See Patient Education module for education specifics.  Outcome: Progressing   Problem: SCI BOWEL ELIMINATION Goal: RH STG MANAGE BOWEL WITH ASSISTANCE Description: STG Manage Bowel with Min Assistance. Outcome: Progressing Goal: RH STG SCI MANAGE BOWEL WITH MEDICATION WITH ASSISTANCE Description: STG SCI Manage bowel with medication with Min assistance. Outcome: Progressing   Problem: SCI BLADDER ELIMINATION Goal: RH STG MANAGE BLADDER WITH ASSISTANCE Description: STG Manage Bladder With Min Assistance Outcome: Progressing   Problem: RH SKIN INTEGRITY Goal: RH STG SKIN FREE OF INFECTION/BREAKDOWN Description: No new breakdown with min assist  Outcome: Progressing Goal: RH STG ABLE TO PERFORM INCISION/WOUND CARE W/ASSISTANCE Description: STG Able To Perform Incision/Wound Care With [employer]. Outcome: Progressing   Problem: RH SAFETY Goal: RH STG ADHERE TO SAFETY PRECAUTIONS W/ASSISTANCE/DEVICE Description: STG Adhere to Safety Precautions With Min Assistance/Device. Outcome: Progressing   Problem: RH PAIN MANAGEMENT Goal: RH STG PAIN MANAGED AT OR BELOW PT'S PAIN GOAL Description: Pt will manage pain at 3 or less on a scale of 0-10.  Outcome: Progressing   Problem: RH KNOWLEDGE DEFICIT SCI Goal: RH STG INCREASE KNOWLEDGE OF SELF CARE AFTER SCI Description: Pt will verbalize increased knowledge of caring for self after SCI including pain management, precautions, and wound care with min assist using educational materials provided by staff.  Outcome: Progressing

## 2020-05-08 NOTE — Plan of Care (Signed)
  Problem: RH Car Transfers Goal: LTG Patient will perform car transfers with assist (PT) Description: LTG: Patient will perform car transfers with assistance (PT). Flowsheets (Taken 05/08/2020 1259) LTG: Pt will perform car transfers with assist:: (downgraded due to decreased balance, difficulty with sequencing and motor planning) Contact Guard/Touching assist Note: downgraded due to decreased balance, difficulty with sequencing and motor planning

## 2020-05-08 NOTE — Plan of Care (Signed)
  Problem: RH Swallowing Goal: LTG Patient will consume least restrictive diet using compensatory strategies with assistance (SLP) Description: LTG:  Patient will consume least restrictive diet using compensatory strategies with assistance (SLP) Flowsheets (Taken 05/08/2020 1656) LTG: Pt Patient will consume least restrictive diet using compensatory strategies with assistance of (SLP): (downgraded due to limited progress of carryover with strategies) Minimal Assistance - Patient > 75% Note: Downgraded due to limited progress with carryover of strategies    Problem: RH Swallowing Goal: LTG Patient will participate in dysphagia therapy to increase swallow function with assistance (SLP) Description: LTG:  Patient will participate in dysphagia therapy to increase swallow function with assistance (SLP) Flowsheets (Taken 05/08/2020 1656) LTG: Pt will participate in dysphagia therapy to increase swallow function with assistance of (SLP): Minimal Assistance - Patient > 75% Note: Downgraded due to limited progress with carryover of strategies   Problem: RH Problem Solving Goal: LTG Patient will demonstrate problem solving for (SLP) Description: LTG:  Patient will demonstrate problem solving for basic/complex daily situations with cues  (SLP) Flowsheets (Taken 05/08/2020 1656) LTG Patient will demonstrate problem solving for: (downgraded due to limited progress) Minimal Assistance - Patient > 75% Note: Downgraded due to limited progress   Problem: RH Memory Goal: LTG Patient will demonstrate ability for day to day (SLP) Description: LTG:   Patient will demonstrate ability for day to day recall/carryover during cognitive/linguistic activities with assist  (SLP) Flowsheets (Taken 05/08/2020 1656) LTG: Patient will demonstrate ability for day to day recall: New information LTG: Patient will demonstrate ability for day to day recall/carryover during cognitive/linguistic activities with assist (SLP):  (downgraded due to slow progress) Minimal Assistance - Patient > 75% Note: Downgraded due to limited progress   Problem: RH Memory Goal: LTG Patient will use memory compensatory aids to (SLP) Description: LTG:  Patient will use memory compensatory aids to recall biographical/new, daily complex information with cues (SLP) Flowsheets (Taken 05/08/2020 1656) LTG: Patient will use memory compensatory aids to (SLP): (downgraded due to slow progress) Minimal Assistance - Patient > 75% Note: Downgraded due to slow progress   Problem: RH Attention Goal: LTG Patient will demonstrate this level of attention during functional activites (SLP) Description: LTG:  Patient will will demonstrate this level of attention during functional activites (SLP) Flowsheets (Taken 05/08/2020 1656) LTG: Patient will demonstrate this level of attention during cognitive/linguistic activities with assistance of (SLP): (downgraded due to slow progress) Minimal Assistance - Patient > 75% Number of minutes patient will demonstrate attention during cognitive/linguistic activities: 10 minutes Note: Downgraded due to slow progress

## 2020-05-08 NOTE — Progress Notes (Signed)
Occupational Therapy Session Note  Patient Details  Name: Johnathan Barker MRN: [id number] Date of Birth: October 27, 1941  Today's Date: 05/08/2020 OT Individual Time: 3299-2426 OT Individual Time Calculation (min): 25 min  OT missed time: 35 mins d/t patient taken down for X-ray   Short Term Goals: Week 2:  OT Short Term Goal 1 (Week 2): Pt will don pants with min assist for standing balance OT Short Term Goal 2 (Week 2): Pt will complete toilet transfers min assist for improved postural control/sequencing OT Short Term Goal 3 (Week 2): Pt will be able to don shoes with setup assist (adaptive laces prn) OT Short Term Goal 4 (Week 2): Pt will completing toileting tasks (clothing management and hygiene) with min assist for standing balance  Skilled Therapeutic Interventions/Progress Updates:    Pt greeted at time of session supine in bed, lethargic and sleepy but arousable. No c/o pain. Discussed OT plan for the day to work on sink level bathing/dressing and pt agreeable but when attempted to transition supine to sit pt was Max A despite hand over hand assist to bring RUE to bed rail and log roll onto side, Max A to manage BLEs and bring UB into an upright sitting position. Once sitting up at EOB, Mod A for scooting buttocks in EOB in preparation for standing to transfer to wheelchair. Attempted to have the pt don shoes, but instead of donning with simple commands pt kept trying to take off shoes so therapist donned for time management to transfer the pt to wheelchair to perform ADLs. At that time, RN entered room stating pt had to be taken down for Xray. Pt returned to bed Max A while maintaining back precautions and scooted up in bed Max A with attempts to have pt assist in bearing weight through feet to bridge. Pt left with tech to take to xray, unable to finish session.  Therapy Documentation Precautions:  Precautions Precautions: Cervical, Back, Fall Precaution Comments: no brace  needed Restrictions Weight Bearing Restrictions: No   Therapy/Group: Individual Therapy  Viona Gilmore 05/08/2020, 10:07 AM

## 2020-05-08 NOTE — Progress Notes (Signed)
Patient ID: Johnathan Barker, male   DOB: 11-07-1941, 79 y.o.   MRN: [id number]   Family education set 6/16 1-3

## 2020-05-08 NOTE — Progress Notes (Addendum)
Speech Language Pathology Daily Session Note  Patient Details  Name: Johnathan Barker MRN: [id number] Date of Birth: 05-15-1941  Today's Date: 05/08/2020 SLP Individual Time: 1446-1520 SLP Individual Time Calculation (min): 34 min  Short Term Goals: Week 2: SLP Short Term Goal 1 (Week 2): Pt will consume therapeutic trials of ice chips and/or puree textures with minimal overt s/sx aspiration and Min A for use of swallow strategies. SLP Short Term Goal 2 (Week 2): Pt will demonstrate carryover and recall of new and/or daily information with Mod A for use of memory strateiges/aids. SLP Short Term Goal 3 (Week 2): Pt will sustain attention to functional tasks for 10 minute intervals with Min a cues for redirection. SLP Short Term Goal 4 (Week 2): Pt will demonstrate ability to problem solving basic familiar situations with Mod A verbal/visual cues. SLP Short Term Goal 5 (Week 2): Pt will detect functional errors with Mod A verbal/visual cues.  Skilled Therapeutic Interventions: Skilled ST services focused on swallow and cognitive skills. Pt expressed fatigue but was wiling to participate in treatment session. SLP provided education on scheduled repeat MBS and all questions were answered to satisfaction. Pt required supervision A verbal cues to complete thorough oral care with suction toothbrush piror to trials of thin via straw. Pt required max A verbal cues to carryover swallow strategies and demonstrating x1 cough during consumption of 4oz. SLP facilitated sustained attention, error awareness and basic problem solving skills in card sorting task by color (with various shapes and numbers of shapes present), pt required min A verbal cues for sustained attention in 5 minute intervals, min A verbal cues for problem solving and mod A verbal cues for error awareness. Pt closed his eyes after task and requested water to "help stay awake." SLP provided thin liquid trials and cold compress. SLP attempted to  have pt sort cards by shapes, however pt requested to end treatment services early due to fatigue. SLP repositioned pt in recliner chair to rest. Pt was left in room with wife call bell within reach and chair alarm set. SLP downgraded swallow and cognitive goals due to slow progress. ST recommends to continue skilled ST services.      Pain Pain Assessment Pain Score: 0-No pain  Therapy/Group: Individual Therapy  Roch Quach  [facility] 05/08/2020, 3:37 PM

## 2020-05-08 NOTE — Progress Notes (Signed)
Physical Therapy Session Note  Patient Details  Name: Johnathan Barker MRN: [id number] Date of Birth: 1941-04-12  Today's Date: 05/08/2020 PT Individual Time: 7253-6644 and 1300-1345 PT Individual Time Calculation (min): 42 min and 45 min  Short Term Goals: Week 1:  PT Short Term Goal 1 (Week 1): Pt will complete least restrictive transfer with min A consistently PT Short Term Goal 1 - Progress (Week 1): Progressing toward goal PT Short Term Goal 2 (Week 1): Pt will ambulate x 50 ft with LRAD and mod A PT Short Term Goal 2 - Progress (Week 1): Progressing toward goal PT Short Term Goal 3 (Week 1): Pt will maintain dynamic standing balance with min A x 5 min PT Short Term Goal 3 - Progress (Week 1): Progressing toward goal Week 2:  PT Short Term Goal 1 (Week 2): Pt will transfer bed<>chair with LRAD min A PT Short Term Goal 2 (Week 2): Pt will ambulate 77ft with LRAD mod A PT Short Term Goal 3 (Week 2): Pt will perform simulated car transfer with LRAD min A  Skilled Therapeutic Interventions/Progress Updates:   Treatment Session 1: 1115-1157 42 min Received pt supine in bed, pt agreeable to therapy, and denied any pain during session. Session with emphasis on dressing, functional mobility/transfers, generalized strengthening, dynamic standing balance/coordination, ambulation, and improved activity tolerance. Doffed soiled brief and donned clean one with max A. Pt rolled to L and R with supervision and use of bedrails and required mod A to don pants and pull over hips. Pt transferred supine<>sitting EOB with min A and cues for logroll technique with increased difficulty problem solving. Doffed dirty gown and donned undershirt and pull over shirt with min A. Donned shoes max A for time management purposes. Pt transferred sit<>stand mod A with cues for upright posture, anterior weight shifting, and to push through LEs and required total A to don and fasten belt. Pt transferred bed<>WC stand<>pivot  mod A with cues for hand placement, stepping, and turning technique. Pt transported to therapy gym in [facility] total A and ambulated 141ft x1 and 1100ft x 1 with RW min/mod A when turning. Pt required cues to increase BOS, for upright posture, and RW safety. Concluded session with pt sitting in WC, needs within reach, and seatbelt alarm on. [name] present at bedside.   Treatment Session 2: 0347-4259 45 min Received pt sitting in WC, pt agreeable to therapy, and denied any pain during session. Session with emphasis on functional mobility/transfers, generalized strengthening, dynamic standing balance/coordination, stair navigation, and improved activity tolerance. Pt transported to therapy gym in [facility] total A for time management purposes. Pt navigated 4 steps with 1 rail on L mod A using lateral stepping technique. Noted pt stepping on top of his feet due to LE ataxia and decreased proprioception and required mod cues to increase BOS and for foot placement on step as well as overall technique. Pt transported to dayroom in [facility] total A and transferred stand<>pivot WC<>Nustep min A with cues for hand placement. Pt performed bilateral UE/LE strengthening on Nustep at workload 6 for 10 minutes with 1 rest break in between for a total of 545 steps. Pt continues to remain confused and verbose discussing family members who have passed away and randomly changing the subject to traveling to [place] and [place]. Pt transferred Nustep<>WC stand<>pivot mod A without AD with same cues mentioned above. Pt transported back to room in [facility] total A and transferred stand<>pivot WC<>recliner min A. Concluded session with pt  sitting in recliner, needs within reach, and seatbelt alarm on. [name] present at bedside.   Therapy Documentation Precautions:  Precautions Precautions: Cervical, Back, Fall Precaution Comments: no brace needed Restrictions Weight Bearing Restrictions: No  Therapy/Group: Individual Therapy [name] PT, DPT   05/08/2020, 7:30 AM

## 2020-05-08 NOTE — Consult Note (Signed)
NAME:  Johnathan Barker, MRN:  [id number], DOB:  August 28, 1941, LOS: 18 ADMISSION DATE:  04/27/2020, CONSULTATION DATE: 6/15 REFERRING MD: patel , CHIEF COMPLAINT:  Cough and dyspnea   Brief History   79 year old male who is status post ACDF of cervical and thoracic spine for myelopathy.  Has had ongoing dysphagia since his admission to rehab on 6/4.  Just completed course of cefepime on 6/14 for pneumonia, pulmonary asked to evaluate for ongoing aspiration and abnormal chest x-ray History of present illness   79 year old male who was admitted to inpatient rehab following C3/4 and T 10/T11-L4 1/2 decompressive surgery which he underwent on 6/3. This was done in the setting of degenerative joint disease with worsening myelopathy.  Hospital course was complicated by confusion and disorientation.  Had residual postoperative weakness and mobility/gait disturbance and because of this was admitted to inpatient rehab. Rehab course: Admitted to inpatient rehab on 6/4 6/5 noted to have some respiratory difficulty with upper airway secretions.  Spiking temperature of 101, requiring young car suction to assist with oral secretions.  Seen by speech-language pathology with bedside evaluation raising concern for pharyngeal dysphagia, started on IV Flagyl and cefepime for possible aspiration 6/6 remaining n.p.o., getting nutrition and medications via nasogastric tube. 6/8: Still with airway congestion, poor cough mechanics.  More restless.  Requiring wrist restraints to keep him from pulling tube out 6/8 through 6/15: Ongoing cough.  Ongoing difficulty with upper airway maintenance.  Completed antibiotics on 6/14.  A follow-up chest x-ray was obtained due to ongoing congestion demonstrating possible right midlung airspace disease because of this critical care/pulmonary asked to evaluate Past Medical History  Myelopathy s/p cervical and thoracic decompression.  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   [facility]   Admitted to inpatient rehab on 6/4 6/5 noted to have some respiratory difficulty with upper airway secretions.  Spiking temperature of 101, requiring young car suction to assist with oral secretions.  Seen by speech-language pathology with bedside evaluation raising concern for pharyngeal dysphagia, started on IV Flagyl and cefepime for possible aspiration 6/6 remaining n.p.o., getting nutrition and medications via nasogastric tube. 6/8: Still with airway congestion, poor cough mechanics.  More restless.  Requiring wrist restraints to keep him from pulling tube out 6/8 through 6/15: Ongoing cough.  Ongoing difficulty with upper airway maintenance.  Completed antibiotics on 6/14.  A follow-up chest x-ray was obtained due to ongoing congestion demonstrating possible right midlung airspace disease because of this critical care/pulmonary asked to evaluate  Consults:  Pulmonary consult the 6/15  Procedures:    Significant Diagnostic Tests:    Micro Data:  6/6 BCx2 staph homoinis  6/8 BCX2: neg  Antimicrobials:   cefepime 6/4>>>6/14  Interim history/subjective:    Objective   Blood pressure 109/61, pulse 82, temperature (Abnormal) 97.5 F (36.4 C), resp. rate 16, height 6' (1.829 m), weight 92.5 kg, SpO2 97 %.        Intake/Output Summary (Last 24 hours) at 05/08/2020 1259 Last data filed at 05/08/2020 0430 Gross per 24 hour  Intake 380 ml  Output 350 ml  Net 30 ml   Filed Weights   04/27/20 1919  Weight: 92.5 kg    Examination: General: Elderly appearing male in NAD HENT: [place]/AT, PERRL, no JVD. Small bore feeding tube.  Lungs: Clear bilateral breath sounds Cardiovascular: RRR, no MRG Abdomen: Soft, non-tender, non-distended Extremities: No edema.  Neuro: Alert, some mild delirium. Pleasant  Resolved Hospital Problem list  Assessment & Plan:  Dysphagia (mod to severe by MBS) Recurrent aspiration PNA  Cough  Nasal  congestion  Possible occult reflux Acute encephalopathy  Myelopathy Steroid induced leukocytosis  Pulm dx Cough in setting of mod to severe dysphagia and inability to adequately clear oral secretions. Does have R mid lung airspace disease worrisome for possible recurrent aspiration but w/out fever and having just completed abx would hold off from empiric rx for now  Plan/rec Cont to trend fever and WBC curve Aspiration precautions Would also add reflux precautions  Add PPI Add nasal hygiene rx  PRN cxr Hold off on abx unless spikes fever  Ck PCT  Cough/deep breathing/Incentinve spirometry  Best practice:  Per primary   Labs   CBC: Recent Labs  Lab 05/07/20 0701 05/08/20 0531  WBC 20.9* 21.1*  NEUTROABS  --  16.9*  HGB 14.8 13.6  HCT 44.9 41.4  MCV 91.8 91.2  PLT 357 703    Basic Metabolic Panel: Recent Labs  Lab 05/07/20 0701  NA 140  K 4.6  CL 102  CO2 30  GLUCOSE 219*  BUN 28*  CREATININE 0.93  CALCIUM 8.8*   GFR: Estimated Creatinine Clearance: 73 mL/min (by C-G formula based on SCr of 0.93 mg/dL). Recent Labs  Lab 05/07/20 0701 05/08/20 0531  WBC 20.9* 21.1*    Liver Function Tests: No results for input(s): AST, ALT, ALKPHOS, BILITOT, PROT, ALBUMIN in the last 168 hours. No results for input(s): LIPASE, AMYLASE in the last 168 hours. No results for input(s): AMMONIA in the last 168 hours.  ABG No results found for: PHART, PCO2ART, PO2ART, HCO3, TCO2, ACIDBASEDEF, O2SAT   Coagulation Profile: No results for input(s): INR, PROTIME in the last 168 hours.  Cardiac Enzymes: No results for input(s): CKTOTAL, CKMB, CKMBINDEX, TROPONINI in the last 168 hours.  HbA1C: Hgb A1c MFr Bld  Date/Time Value Ref Range Status  04/24/2020 05:48 AM 7.5 (H) 4.8 - 5.6 % Final    Comment:    (NOTE) Pre diabetes:          5.7%-6.4% Diabetes:              >6.4% Glycemic control for   <7.0% adults with diabetes   08/23/2018 11:31 AM 6.6 (H) 4.8 - 5.6 %  Final    Comment:    (NOTE)         Prediabetes: 5.7 - 6.4         Diabetes: >6.4         Glycemic control for adults with diabetes: <7.0     CBG: Recent Labs  Lab 05/07/20 2007 05/08/20 0010 05/08/20 0407 05/08/20 0743 05/08/20 1203  GLUCAP 160* 217* 227* 180* 236*    Review of Systems:   Bolds are positive  Constitutional: weight loss, gain, night sweats, Fevers, chills, fatigue .  HEENT: headaches, Sore throat, sneezing, nasal congestion, post nasal drip, Difficulty swallowing, Tooth/dental problems, visual complaints visual changes, ear ache CV:  chest pain, radiates:,Orthopnea, PND, swelling in lower extremities, dizziness, palpitations, syncope.  GI  heartburn, indigestion, abdominal pain, nausea, vomiting, diarrhea, change in bowel habits, loss of appetite, bloody stools.  Resp: cough, productive: , hemoptysis, dyspnea mild, chest pain, pleuritic.  Skin: rash or itching or icterus GU: dysuria, change in color of urine, urgency or frequency. flank pain, hematuria  MS: joint pain or swelling. decreased range of motion  Psych: change in mood or affect. depression or anxiety.  Neuro: difficulty with speech, weakness, numbness, ataxia  Past Medical History  He,  has a past medical history of Arthritis, Diabetes mellitus without complication ([place]), Dysrhythmia, History of kidney stones, Hypertension, and Weakness of both legs (04/24/2020).   Surgical History    Past Surgical History:  Procedure Laterality Date  . ANTERIOR CERVICAL DECOMP/DISCECTOMY FUSION N/A 04/26/2020   Procedure: Cervical Three-Four anterior cervical decompression/discectomy/fusion with plate;  Surgeon: Eustace Moore, MD;  Location: [place] [facility];  Service: Neurosurgery;  Laterality: N/A;  anterior, Part-1  . EYE SURGERY Bilateral    cataracts removed= IOL  . LUMBAR LAMINECTOMY/DECOMPRESSION MICRODISCECTOMY N/A 04/26/2020   Procedure: Thoracic Ten-Eleven Laminectomy;  Surgeon: Eustace Moore, MD;  Location:  [place];  Service: Neurosurgery;  Laterality: N/A;  Posterior, Part-2  . TONSILLECTOMY    . TOTAL KNEE ARTHROPLASTY Right 11/30/2017   Procedure: RIGHT TOTAL KNEE ARTHROPLASTY;  Surgeon: Vickey Huger, MD;  Location: [place];  Service: Orthopedics;  Laterality: Right;  . TOTAL KNEE ARTHROPLASTY Left 09/13/2018   Procedure: LEFT TOTAL KNEE ARTHROPLASTY;  Surgeon: Vickey Huger, MD;  Location: [facility];  Service: Orthopedics;  Laterality: Left;  with block     Social History   reports that he has never smoked. He has never used smokeless tobacco. He reports that he does not drink alcohol and does not use drugs.   Family History   His family history is negative for CAD and Diabetes Mellitus II.   Allergies Allergies  Allergen Reactions  . Oxycodone     Ineffective/disorientation  . Penicillins Hives, Rash and Other (See Comments)    FEVER  PATIENT HAS HAD A PCN REACTION WITH IMMEDIATE RASH, FACIAL/TONGUE/THROAT SWELLING, SOB, OR LIGHTHEADEDNESS WITH HYPOTENSION:  #  #  #  YES  #  #  #   Has patient had a PCN reaction causing severe rash involving mucus membranes or skin necrosis: No Has patient had a PCN reaction that required hospitalization: No Has patient had a PCN reaction occurring within the last 10 years: No If all of the above answers are "NO", then may proceed with Cephalosporin use.      Home Medications  Prior to Admission medications   Medication Sig Start Date End Date Taking? Authorizing Provider  amLODipine (NORVASC) 5 MG tablet Take 5 mg by mouth at bedtime.     [provider]  aspirin EC 325 MG EC tablet Take 1 tablet (325 mg total) by mouth 2 (two) times daily. Patient not taking: Reported on 04/23/2020 09/14/18   Donia Ast, PA  atorvastatin (LIPITOR) 10 MG tablet Take 10 mg by mouth daily.    [provider]  glimepiride (AMARYL) 4 MG tablet Take 4 mg by mouth 2 (two) times daily. Morning & evening 09/07/17   [provider]  Insulin  Glargine (BASAGLAR KWIKPEN) 100 UNIT/ML Inject 2 mLs into the skin at bedtime. 04/10/20   [provider]  methocarbamol (ROBAXIN) 500 MG tablet Take 1 tablet (500 mg total) by mouth 4 (four) times daily. 04/27/20   Meyran, Ocie Cornfield, NP  metoprolol succinate (TOPROL-XL) 50 MG 24 hr tablet Take 50 mg by mouth daily before breakfast. Take with or immediately following a meal.     [provider]  olmesartan (BENICAR) 40 MG tablet Take 40 mg by mouth daily. 11/09/19   [provider]  Omega-3 Fatty Acids (FISH OIL PO) Take 1 capsule by mouth daily.     [provider]  oxyCODONE-acetaminophen (PERCOCET) 5-325 MG tablet Take 1 tablet by mouth every  4 (four) hours as needed for severe pain. 04/27/20 04/27/21  Meyran, Ocie Cornfield, NP      Georgann Housekeeper, AGACNP-BC [place] for personal pager PCCM on call pager [phone]  05/08/2020 1:55 PM

## 2020-05-08 NOTE — Progress Notes (Signed)
Pam, PA was made aware of chest x-Vylet Maffia results. No new orders at this time. Will continue to monitor. Junius Argyle, RN, Refresher/ Mohammed Kindle, RN

## 2020-05-08 NOTE — Progress Notes (Addendum)
[facility] PHYSICAL MEDICINE & REHABILITATION PROGRESS NOTE  Subjective/Complaints: Patient seen sitting up in bed this morning.  He states he slept well overnight.  He is perseverative on having food and water in his mouth.  ROS: Limited due to cognition.  Objective: Vital Signs: Blood pressure (!) 143/60, pulse 81, temperature (!) 97.5 F (36.4 C), resp. rate 16, height 6' (1.829 m), weight 92.5 kg, SpO2 95 %. No results found. Recent Labs    05/07/20 0701 05/08/20 0531  WBC 20.9* 21.1*  HGB 14.8 13.6  HCT 44.9 41.4  PLT 357 310   Recent Labs    05/07/20 0701  NA 140  K 4.6  CL 102  CO2 30  GLUCOSE 219*  BUN 28*  CREATININE 0.93  CALCIUM 8.8*    Physical Exam: BP (!) 143/60 (BP Location: Right Arm)   Pulse 81   Temp (!) 97.5 F (36.4 C)   Resp 16   Ht 6' (1.829 m)   Wt 92.5 kg   SpO2 95%   BMI 27.66 kg/m   Constitutional: No distress . Vital signs reviewed. HENT: Normocephalic.  Atraumatic.  + NG. Eyes: EOMI. No discharge. Cardiovascular: No JVD. Respiratory: Normal effort.  No stridor. GI: Non-distended. Skin: Incision with edema C/D/I Psych: Confused.  Perseverative. Musc: No edema in extremities.  No tenderness in extremities. Neurological: Alert and oriented to name and place only Motor: 4+-5/5 throughout, stable  Assessment/Plan: 1. 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  Physiatrist is providing close team supervision and 24 hour management of active medical problems listed below.  Physiatrist and rehab team continue to assess barriers to discharge/monitor patient progress toward functional and medical goals  Care Tool:  Bathing    Body parts bathed by patient: Right arm, Left arm, Chest, Abdomen, Face, Front perineal area, Right upper leg, Left upper leg   Body parts bathed by helper: Buttocks      Bathing assist Assist Level: Minimal Assistance - Patient > 75%     Upper Body Dressing/Undressing Upper body dressing   What is the patient wearing?: Pull over shirt    Upper body assist Assist Level: Contact Guard/Touching assist    Lower Body Dressing/Undressing Lower body dressing      What is the patient wearing?: Incontinence brief, Pants     Lower body assist Assist for lower body dressing: Moderate Assistance - Patient 50 - 74%     Toileting Toileting Toileting Activity did not occur Landscape architect and hygiene only): Refused  Toileting assist Assist for toileting: Maximal Assistance - Patient 25 - 49%     Transfers Chair/bed transfer  Transfers assist     Chair/bed transfer assist level: Moderate Assistance - Patient 50 - 74%     Locomotion Ambulation   Ambulation assist      Assist level: Moderate Assistance - Patient 50 - 74% Assistive device: Walker-rolling Max distance: 61ft   Walk 10 feet activity   Assist  Walk 10 feet activity did not occur: Safety/medical concerns  Assist level: Moderate Assistance - Patient - 50 - 74% Assistive device: Walker-rolling   Walk 50 feet activity   Assist Walk 50 feet with 2 turns activity did not occur: Safety/medical concerns  Assist level: Moderate Assistance - Patient - 50 - 74% Assistive device: Walker-rolling    Walk 150 feet activity   Assist Walk 150 feet activity did not occur: Safety/medical  concerns         Walk 10 feet on uneven surface  activity   Assist Walk 10 feet on uneven surfaces activity did not occur: Safety/medical concerns         Wheelchair     Assist Will patient use wheelchair at discharge?:  (TBD) Type of Wheelchair: Manual Wheelchair activity did not occur: Safety/medical concerns  Wheelchair assist level: Supervision/Verbal cueing Max wheelchair distance: 112ft    Wheelchair 50 feet with 2 turns activity    Assist    Wheelchair 50  feet with 2 turns activity did not occur: Safety/medical concerns   Assist Level: Supervision/Verbal cueing   Wheelchair 150 feet activity     Assist Wheelchair 150 feet activity did not occur: Safety/medical concerns   Assist Level: Supervision/Verbal cueing      Medical Problem List and Plan: 1.  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  Continue CIR  IV Steroids started on 6/5, transition to p.o. on 6/10, decreased on 6/11, decreased further on 6/13 2.  Antithrombotics: -DVT/anticoagulation:  Mechanical: Sequential compression devices, below knee Bilateral lower extremities             -antiplatelet therapy: N/A 3. Pain Management: Tylenol prn. Does not like Oxycodone.  Ordered ultram prn  Appears controlled on 6/15  Monitor with increased exertion as well as cognition side effects 4. Mood: LCSW to follow for evaluation and support.              -antipsychotic agents: N/A 5. Neuropsych: This patient is not fully capable of making decisions on his own behalf. 6. Skin/Wound Care: Monitor wound for healing.  7. Fluids/Electrolytes/Nutrition: Encourage fluid intake. Monitor I/O.  8. HTN: Monitor BP -continue amlodipine, Irbesartan, and metoprolol.   Labile on 6/15  Monitor with increased mobility 9.  Steroid-induced hyperglycemia on T2DM with hyperglycemia: Hgb A1c- 7.5. Monitor BS ac/hs. Used amaryl 4 mg bid and lantus 2 units HS.   Amaryl on hold at present   Lantus 10, increased to 14 on 6/13  Novolog 5 with meals and at bedtime  Elevated/slightly labile on 6/15, will need to monitor closely with steroid wean 10. Leucocytosis: Likely secondary to steroids + PNA.               WBC 21.1 on 6/15  UA/urine culture ordered  Repeat chest x-ray ordered  Afebrile  Continue to monitor 11. Constipation:              Adjust bowel meds as necessary 12. Hyperbilirubinemia:    Elevated on 6/5, continue to monitor 13. Pharyngeal dysphagia: ST to follow for evaluation.   N.p.o. with NG at present after MBS, water protocol  Continue tube feeds  DC IVF  Advance diet as tolerated 14.  Urinary retention             Improving 15.  Sepsis/aspiration pneumonia, associated delirium. Currently afebrile  Bcx + x 2 for staph hominis, discussed with pharmacy  Initial CXR's have demonstrated right lower lobe consolidation, repeat chest x-ray personally reviewed, showing same  Completed IV cefepime on 6/15  IV Flagyl DC'd after discussion with pharmacy  Repeat blood cultures no growth  Echo reviewed, no signs of vegetation, EF 55-60% 16.  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

## 2020-05-09 ENCOUNTER — Inpatient Hospital Stay (HOSPITAL_COMMUNITY): Payer: Medicare Other | Admitting: Occupational Therapy

## 2020-05-09 ENCOUNTER — Inpatient Hospital Stay (HOSPITAL_COMMUNITY): Payer: Medicare Other

## 2020-05-09 ENCOUNTER — Encounter (HOSPITAL_COMMUNITY): Payer: Medicare Other

## 2020-05-09 DIAGNOSIS — R4689 Other symptoms and signs involving appearance and behavior: Secondary | ICD-10-CM | POA: Insufficient documentation

## 2020-05-09 DIAGNOSIS — R41841 Cognitive communication deficit: Secondary | ICD-10-CM | POA: Insufficient documentation

## 2020-05-09 DIAGNOSIS — R410 Disorientation, unspecified: Secondary | ICD-10-CM | POA: Insufficient documentation

## 2020-05-09 DIAGNOSIS — R4189 Other symptoms and signs involving cognitive functions and awareness: Secondary | ICD-10-CM

## 2020-05-09 LAB — PROCALCITONIN: Procalcitonin: <0.1 ng/mL

## 2020-05-09 LAB — GLUCOSE, CAPILLARY
Glucose-Capillary: 152 mg/dL — ABNORMAL HIGH (ref 70–99)
Glucose-Capillary: 186 mg/dL — ABNORMAL HIGH (ref 70–99)
Glucose-Capillary: 187 mg/dL — ABNORMAL HIGH (ref 70–99)
Glucose-Capillary: 187 mg/dL — ABNORMAL HIGH (ref 70–99)
Glucose-Capillary: 208 mg/dL — ABNORMAL HIGH (ref 70–99)
Glucose-Capillary: 209 mg/dL — ABNORMAL HIGH (ref 70–99)
Glucose-Capillary: 231 mg/dL — ABNORMAL HIGH (ref 70–99)

## 2020-05-09 LAB — TSH: TSH: 2.499 u[IU]/mL (ref 0.350–4.500)

## 2020-05-09 LAB — URINE CULTURE: Culture: NO GROWTH

## 2020-05-09 IMAGING — MR MR HEAD WO/W CM
9 of 12 series · 35 of 48 positions shown · IV contrast (gadavist)
Comparison: [DATE]

CLINICAL DATA: Encephalopathy.  Recent spine surgery.

EXAM:
MRI HEAD WITHOUT AND WITH CONTRAST
TECHNIQUE: Multiplanar, multiecho pulse sequences of the brain and surrounding
structures were obtained without and with intravenous contrast.
CONTRAST:  9 mL Gadavist

[Series 3: DWI · axial · 3.0mm · 1.09mm/px · z∈[-62,+100]mm · 9 of 110 slices shown (1 of 4)]
[im 1/110]
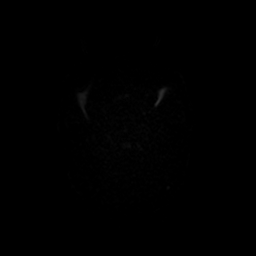
[im 14/110]
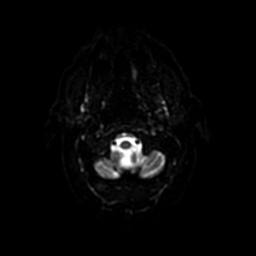
[im 28/110]
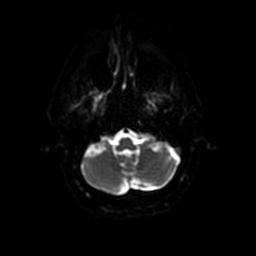
[im 41/110]
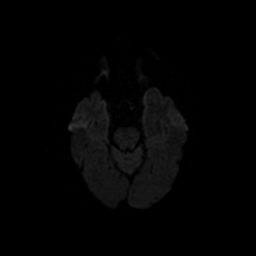
[im 55/110]
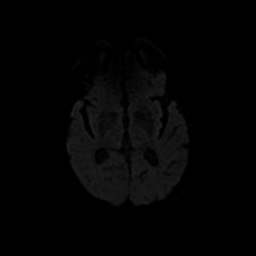
[im 69/110]
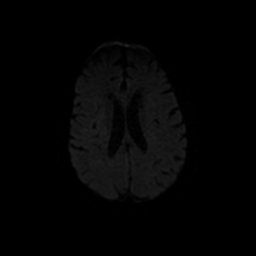
[im 82/110]
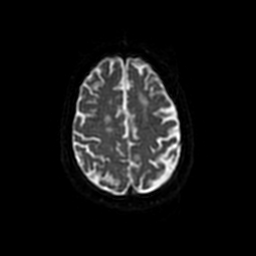
[im 96/110]
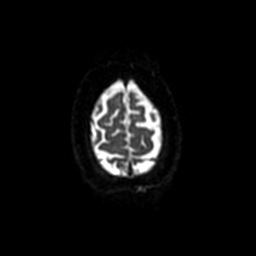
[im 110/110]
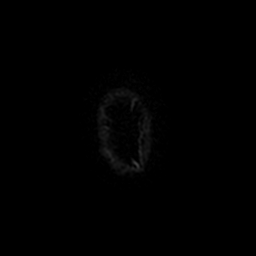

[Series 4: DWI · coronal · 5.0mm · 1.09mm/px · 6 of 72 slices shown (2 of 4)]
[im 1/72]
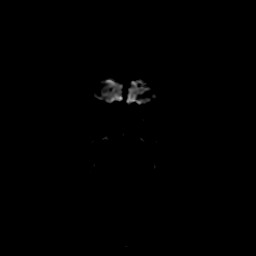
[im 15/72]
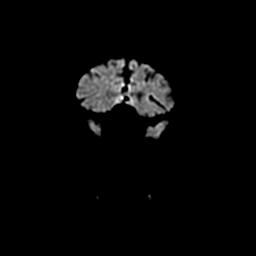
[im 29/72]
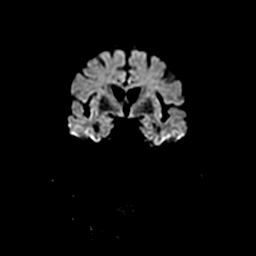
[im 43/72]
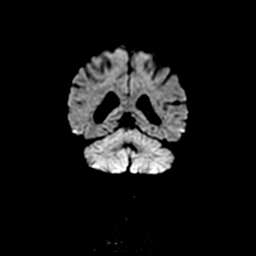
[im 57/72]
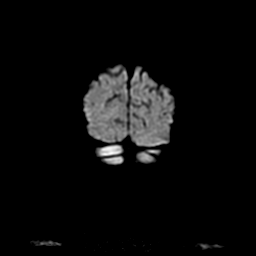
[im 72/72]
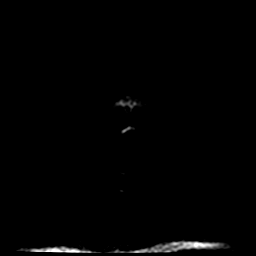

[Series 6: T2 · axial · 5.0mm · 0.43mm/px · z∈[-59,+96]mm · 2 of 27 slices shown (1 of 2)]
[im 1/27]
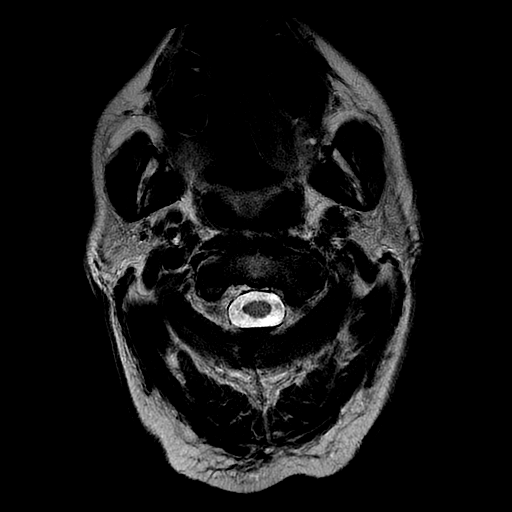
[im 27/27]
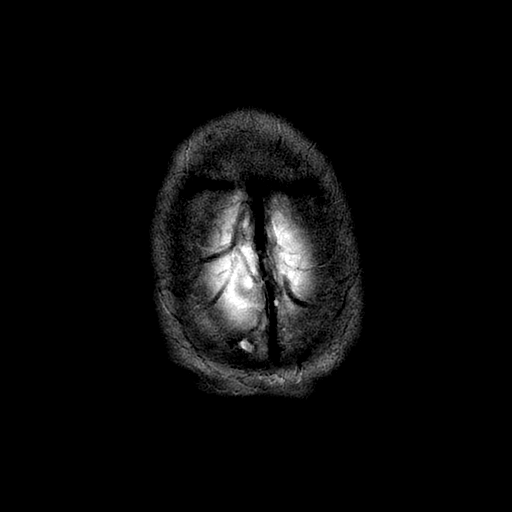

[Series 7: FLAIR · axial · 5.0mm · 0.43mm/px · z∈[-59,+96]mm · 2 of 27 slices shown]
[im 1/27]
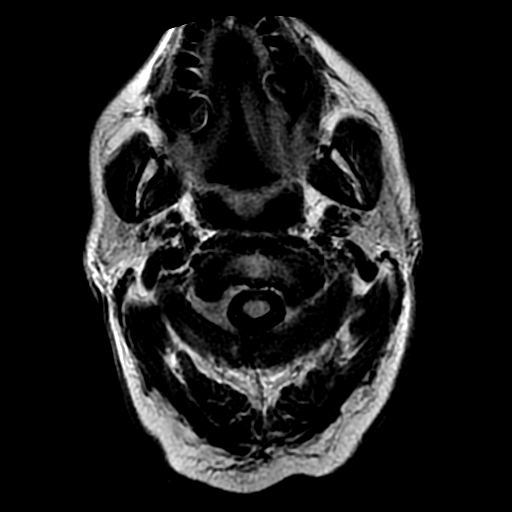
[im 27/27]
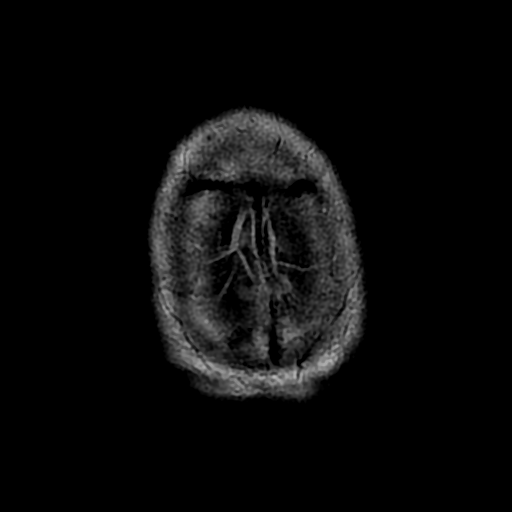

[Series 11: T1 post-contrast · axial · 3.0mm · 0.45mm/px · z∈[-55,+98]mm · 4 of 52 slices shown (1 of 2)]
[im 1/52]
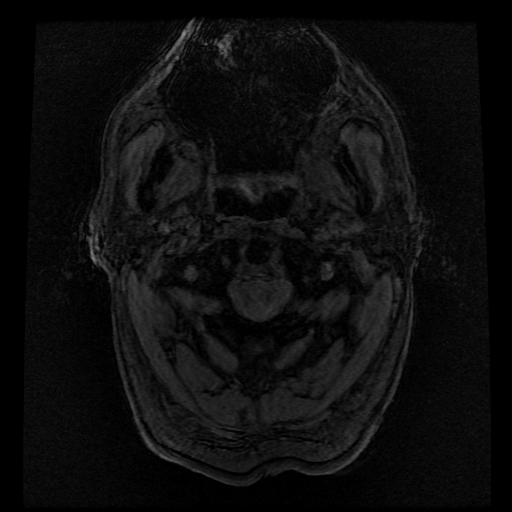
[im 18/52]
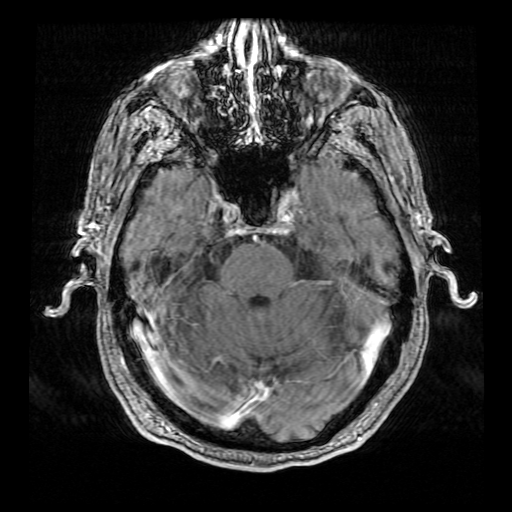
[im 35/52]
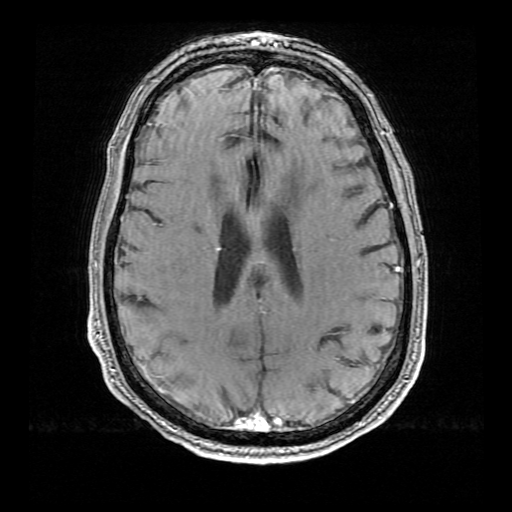
[im 52/52]
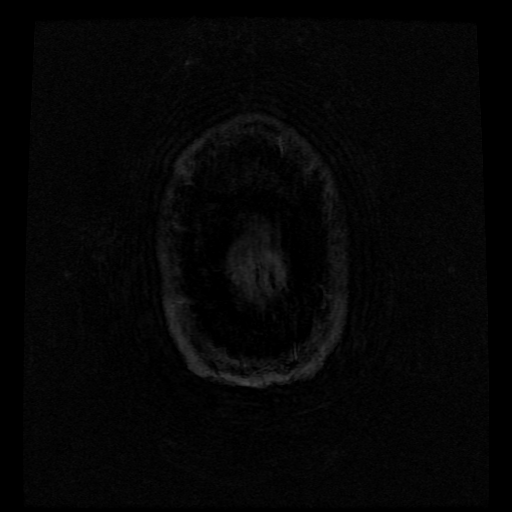

[Series 12: T1 post-contrast · coronal · 5.0mm · 0.43mm/px · 2 of 24 slices shown (2 of 2)]
[im 1/24]
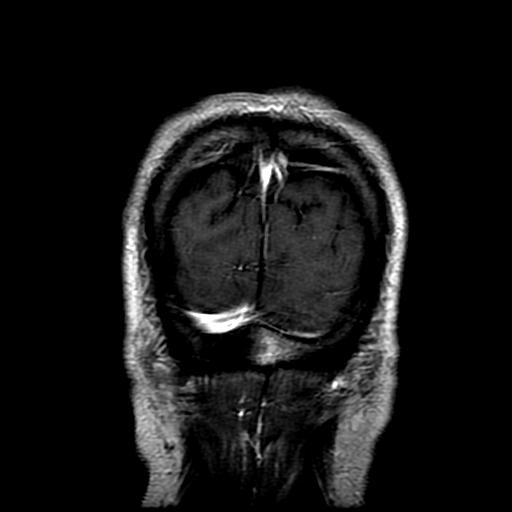
[im 24/24]
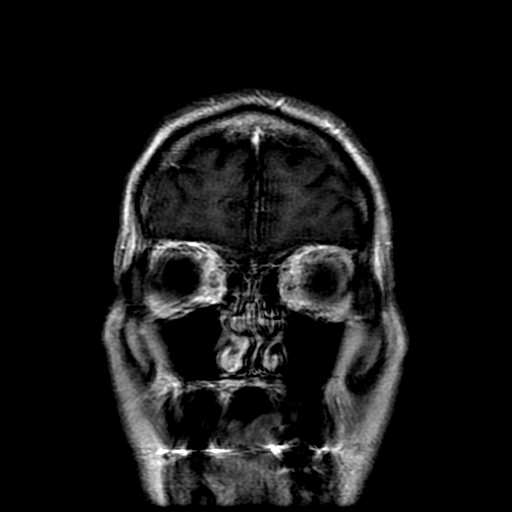

[Series 13: T2 · coronal · 5.0mm · 0.43mm/px · 2 of 24 slices shown (2 of 2)]
[im 1/24]
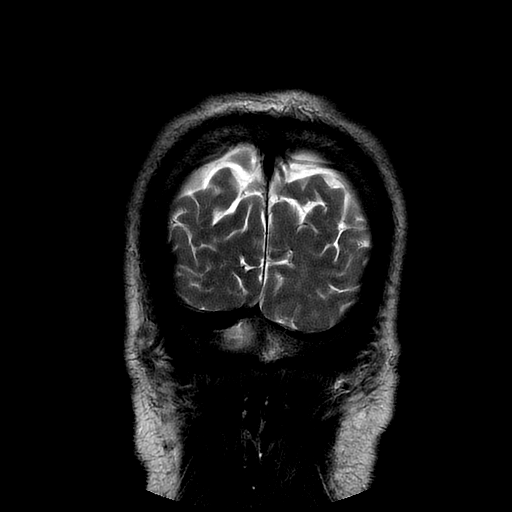
[im 24/24]
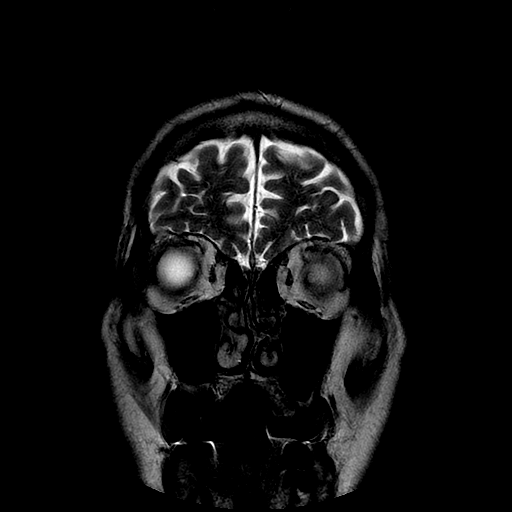

[Series 300: DWI · axial · 3.0mm · 1.09mm/px · z∈[-62,+100]mm · 5 of 55 slices shown (3 of 4)]
[im 1/55]
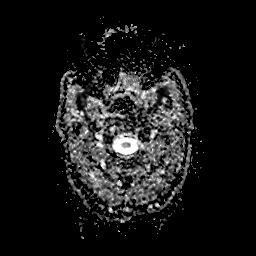
[im 14/55]
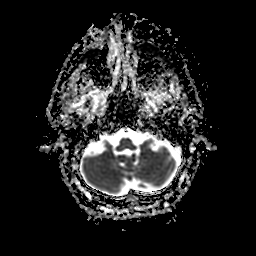
[im 28/55]
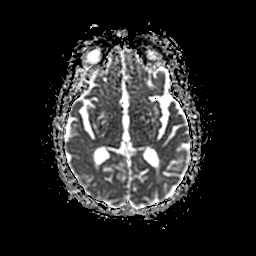
[im 41/55]
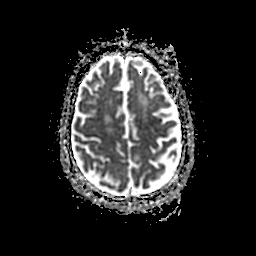
[im 55/55]
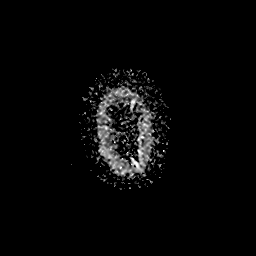

[Series 400: DWI · coronal · 5.0mm · 1.09mm/px · 3 of 36 slices shown (4 of 4)]
[im 1/36]
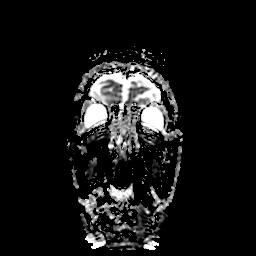
[im 18/36]
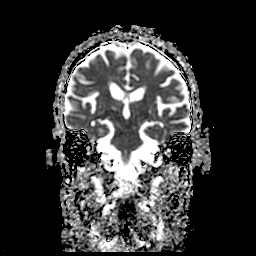
[im 36/36]
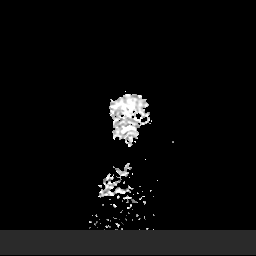

[35 of 48 positions shown; findings below may reference images not displayed]

FINDINGS: The study is mildly motion degraded.

Brain: No acute infarct, mass, midline shift, or extra-axial fluid
collection is identified. Superficial siderosis is again noted in
the left frontal lobe consistent with remote subarachnoid
hemorrhage. Small chronic infarcts are again noted involving the
bilateral basal ganglia, right thalamus, right corona radiata, and
lateral right temporal lobe cortex. There is mild generalized
cerebral atrophy. T2 hyperintensities elsewhere in the cerebral
white matter bilaterally are unchanged and nonspecific but
compatible with moderate chronic small vessel ischemic disease. No
abnormal enhancement is identified.

Vascular: Major intracranial arterial flow voids are preserved. Lack
of a normal left sigmoid sinus flow void, unchanged and favored to
reflect slow flow over occlusion.

Skull and upper cervical spine: Unremarkable bone marrow signal.
C3-4 ACDF.

Sinuses/Orbits: Bilateral cataract extraction. Paranasal sinuses and
mastoid air cells are clear.

Other: None.
IMPRESSION: 1. No acute intracranial abnormality.
2. Moderate chronic small vessel ischemic disease with multiple
chronic infarcts as above.

## 2020-05-09 MED ORDER — GADOBUTROL 1 MMOL/ML IV SOLN
9.0000 mL | Freq: Once | INTRAVENOUS | Status: AC | PRN
  Administered 2020-05-09: 9 mL via INTRAVENOUS

## 2020-05-09 MED ORDER — DEXAMETHASONE 2 MG PO TABS
1.0000 mg | ORAL_TABLET | Freq: Every day | ORAL | Status: DC
  Administered 2020-05-10 – 2020-05-11 (×2): 1 mg via ORAL
  Filled 2020-05-09 (×2): qty 1

## 2020-05-09 NOTE — Progress Notes (Signed)
Physical Therapy Session Note  Patient Details  Name: Johnathan Barker MRN: [id number] Date of Birth: 02/03/1941  Today's Date: 05/09/2020 PT Individual Time: 1330-1455 PT Individual Time Calculation (min): 85 min   Short Term Goals: Week 1:  PT Short Term Goal 1 (Week 1): Pt will complete least restrictive transfer with min A consistently PT Short Term Goal 1 - Progress (Week 1): Progressing toward goal PT Short Term Goal 2 (Week 1): Pt will ambulate x 50 ft with LRAD and mod A PT Short Term Goal 2 - Progress (Week 1): Progressing toward goal PT Short Term Goal 3 (Week 1): Pt will maintain dynamic standing balance with min A x 5 min PT Short Term Goal 3 - Progress (Week 1): Progressing toward goal Week 2:  PT Short Term Goal 1 (Week 2): Pt will transfer bed<>chair with LRAD min A PT Short Term Goal 2 (Week 2): Pt will ambulate 48ft with LRAD mod A PT Short Term Goal 3 (Week 2): Pt will perform simulated car transfer with LRAD min A  Skilled Therapeutic Interventions/Progress Updates:   Received pt sitting in WC, pt agreeable to therapy, and denied any pain during session. [name] and son present for family education training. Session with emphasis on family education, discharge planning, functional mobility/transfers, generalized strengthening, dynamic standing balance/coordination, ambulation, stair navigation, simulated car transfers, and improved endurance with activity. Pt transported to ortho gym in [facility] total A. Pt performed simulated car transfer without AD min/mod A x 3 trials. Trial 1 with therapist and trial 2-3 with family. Pt required cues for hand placement, turning technique, and safe entry into car. Pt's family was educated on positioning, body mechanics, and cues to provide to pt. Pt ambulated 167ft x 2 trials and 41ft x 1 trial with RW min/mod A for turning. Trial 1 with therapist and trials 2-3 with family. Pt demonstrated improved LE motor control and required less cues to  increase BOS. Pt's family educated on body positioning, RW safety, cues, and turning technique with ambulation. Pt required multiple rest breaks throughout session due to fatigue and weakness. Pt transported to therapy gym in [facility] total A and navigated 4 steps with 1 rail on L using a lateral stepping technique with mod A from therapist. Pt with LE ataxia and required max cues for stepping sequence and foot placement on step at pt frequently stepping on top of his feet. [name] provided close CGA during stair navigation but declined practicing again due to pt's fatigue. Pt transported to rehab apartment in [facility] total A and performed furniture transfer onto recliner with mod A overall. Therapist assisted with transfer to recliner and pt's son assisted with transfer back to [facility]. Pt required cues for hand placement, stepping, turning technique, and eccentric control. Pt's family was educated on transfer setup, hand placement, and overall technique. Pt transported back to room in [facility] total A and transferred WC<>bed stand<>pivot min A with assist from pt's son and sit<>supine with supervision. Concluded session with pt supine in bed, needs within reach, and bed alarm on. [name] verbalized confidence with all transfer tasks performed today and pt's wife requested additional practice.   Therapy Documentation Precautions:  Precautions Precautions: Cervical, Back, Fall Precaution Comments: no brace needed Restrictions Weight Bearing Restrictions: No  Therapy/Group: Individual Therapy Johnathan Barker PT, DPT   05/09/2020, 7:30 AM

## 2020-05-09 NOTE — Consult Note (Signed)
Neuropsychological Consultation   Patient:   Johnathan Barker   DOB:   1941/03/06  MR Number:  [id number]  Location:  [address] [place] [id number] [place] [address] Dept: [place]: [phone]           Date of Service:   05/08/2020  Start Time:   4 PM End Time:   5 PM  Provider/Observer:  Ilean Skill, Psy.D.       Clinical Neuropsychologist       Billing Code/Service: 82505  Chief Complaint:    Johnathan Barker. [name] is a 79 year old male who has a prior medical history of hypertension, type 2 diabetes, bilateral lower extremity weakness with difficulty walking over the past couple months with frequent falls.  The patient has also recently had some significant cognitive deficits including changes in expressive language and confusion.  The patient was admitted on 04/24/2020 with lower extremity weakness and feeling cold.  The patient had an MRI performed on 04/24/2020 that was unremarkable for acute intracranial process.  However, there were significant chronic abnormalities noted.  Findings included mild diffuse parenchymal volume loss, scattered periventricular and deep white matter T2/FLAIR hyperintensities that were nonspecific but commonly associated with chronic microvascular ischemic changes.  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  There is no midline shift or mass lesion.  The patient was found to have significant degenerative disc disease with marked canal stenosis C3--C4 without abnormal signal, severe canal stenosis with cord compression and abnormal signal at T10-T11, severe canal stenosis at L1/L2 and mild to moderate canal stenosis L 2/3 and L3/4.  Also found to have 3.5 cm right thyroid nodule.  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  Postoperative course complicated by confusion and disorientation reported by nursing as well as wife and son.  Patient with associated bilateral lower extremity spasms.  Today I had clinical interview with the patient, his wife and we also spoke with his son via speaker phone who had been with the patient during and immediately after and for the next few days of the surgery.  The patient displayed significant expressive language deficits.  The patient's wife and his son report that immediately after surgery he was doing fairly well with good communication and proceeded to eat a large volume of food without significant confusion.  However, by the next day the patient had become very confused including mistaking his wife for his mother and other confusion as well as significant language changes including dysarthria and word finding issues.  There was a significant change in his mental status and confusion.  There appeared to be no significant deficits in hand strength between right and left but the patient has extra weakness in his right leg versus left leg.  These were present and related to falls prior to surgery.  The patient has improved with his confusion but continues with expressive language changes.  The patient has a suspected pneumonia that is being treated.  Reason for Service:  The patient was referred for neuropsychological consultation due to ongoing cognitive and expressive language changes.  Below is the HPI for the current admission.  HPI:  Johnathan Barker. [name] is a 79 year old male with history of HTN, T2DM, BLE weakness with difficulty walking over past couple of months  with frequent falls.  History taken from chart review, son, and wife due to mentation.  He was admitted on 04/24/2020 with lower extremity weakness and feeling cold.  MRI brain, personally reviewed, unremarkable for acute intracranial process. He was found to have DDD with  marked canal stenosis C3-C3 without abnormal signal, severe canal stenosis with cord compression and abnormal signal T10-T11, severe canal stenosis L1/L2 and mild to moderate canal stenosis L2/3 and L3/4.  Also found to have 3.5 cm right thyroid nodule with recommendations for thyroid ultrasound. Dr. 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 He was started on IV decadron and on 04/26/2020, patient underwent ACDF of C3/C4 and decompressive thoracic laminectomy/medial facetectomy.  Post op course complicated by confusion and disorientation per nurse and family (thought his wife was his mother and his son was his brother--has not been acting like himself per family).  Patient with associated bilateral lower extremity spasms.  Please see preadmission assessment from earlier today as well.  Current Status:  During the clinical interview today the patient had dysarthric speech, word finding difficulties, issues with inhibition, attentional deficits, distractibility, impulse control issues.  He had residual weakness in his right leg but good bilateral strength in his arms.  In-depth discussions with the patient's wife as well as his son via conference call suggested that his speech and cognition was actually better immediately after the surgery and then deteriorated significantly the next day.  While he has improved especially with his issues with confusion that continues to be cognitive deficits around attention and executive functioning.  Behavioral Observation: Johnathan Barker  presents as a 79 y.o.-year-old Right Caucasian Male who appeared his stated age. his dress was Appropriate and he was Well Groomed and his manners were Appropriate to the situation.  his participation was indicative of Appropriate, Inattentive and Redirectable behaviors.  There were physical disabilities noted.  he  displayed an appropriate level of cooperation and motivation.     Interactions:    Active Inattentive and Redirectable  Attention:   abnormal and attention span appeared shorter than expected for age  Memory:   abnormal; remote memory intact, recent memory impaired  Visuo-spatial:  not examined  Speech (Volume):  normal  Speech:   non-fluent aphasia; garbled  Thought Process:  Circumstantial, Tangential and Disorganized  Though Content:  WNL; not suicidal and not homicidal  Orientation:   person, place and time/date  Judgment:   Poor  Planning:   Poor  Affect:    Appropriate and Tearful  Mood:    Dysphoric  Insight:   Fair  Intelligence:   normal  Medical History:   Past Medical History:  Diagnosis Date  . Arthritis    knees  . Diabetes mellitus without complication ([place])    type II  . Dysrhythmia   . History of kidney stones    lithotripsy  . Hypertension   . Weakness of both legs 04/24/2020        Abuse/Trauma History: The patient has had a significant acute reduction in functionality.  Psychiatric History:  No prior psychiatric history.  Family Med/Psych History:  Family History  Problem Relation Age of Onset  . CAD Neg Hx   . Diabetes Mellitus II Neg Hx    Impression/DX:  Johnathan Barker. [name] is a 79 year old male who has a prior medical history of hypertension, type 2 diabetes, bilateral lower extremity weakness with difficulty walking  over the past couple months with frequent falls.  The patient has also recently had some significant cognitive deficits including changes in expressive language and confusion.  The patient was admitted on 04/24/2020 with lower extremity weakness and feeling cold.  The patient had an MRI performed on 04/24/2020 that was unremarkable for acute intracranial process.  However, there were significant chronic abnormalities noted.  Findings included mild diffuse parenchymal volume loss, scattered periventricular and deep white matter  T2/FLAIR hyperintensities that were nonspecific but commonly associated with chronic microvascular ischemic changes.  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  There is no midline shift or mass lesion.  The patient was found to have significant degenerative disc disease with marked canal stenosis C3--C4 without abnormal signal, severe canal stenosis with cord compression and abnormal signal at T10-T11, severe canal stenosis at L1/L2 and mild to moderate canal stenosis L 2/3 and L3/4.  Also found to have 3.5 cm right thyroid nodule.  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  Postoperative course complicated by confusion and disorientation reported by nursing as well as wife and son.  Patient with associated bilateral lower extremity spasms.  Today I had clinical interview with the patient, his wife and we also spoke with his son via speaker phone who had been with the patient during and immediately after and for the next few days of the surgery.  The patient displayed significant expressive language deficits.  The patient's wife and his son report that immediately after surgery he was doing fairly well with good communication and proceeded to eat a large volume of food without significant confusion.  However, by the next day the patient had become very confused including mistaking his wife for his mother and other confusion as well as significant language changes including dysarthria and word finding issues.  There was a significant change in his mental status and confusion.  There appeared to be no significant deficits in hand strength between right and left but the patient has extra weakness in his right leg versus left leg.  These were present and related to falls prior to surgery.  The patient has improved with  his confusion but continues with expressive language changes.  The patient has a suspected pneumonia that is being treated.  During the clinical interview today the patient had dysarthric speech, word finding difficulties, issues with inhibition, attentional deficits, distractibility, impulse control issues.  He had residual weakness in his right leg but good bilateral strength in his arms.  In-depth discussions with the patient's wife as well as his son via conference call suggested that his speech and cognition was actually better immediately after the surgery and then deteriorated significantly the next day.  While he has improved especially with his issues with confusion that continues to be cognitive deficits around attention and executive functioning.  Disposition/Plan:  I do think we should get a repeat MRI study done if possible given the timeline presented by the patient and his family regarding the development of confusion and cognitive changes.  This would also be appropriate given the patient's prior MRI and history of cerebrovascular issues and prior events and sequela of events.  I have communicated this to Dr. Posey Pronto who is his attending and he is looking into the appropriateness of having a follow-up MRI study done.  It is possible that there were  secondary cerebrovascular events post surgery but the patient also had a number of other issues that could explain his confusion including pneumonia and UTI type infections as well as having a very complex multilevel spinal surgery.  I do think, however, we should rule out the possibility of other cerebrovascular events happening as part of his overall medical status.  Diagnosis:   Cognitive deficits including expressive language deficits and executive functioning deficits        Electronically Signed   _______________________ Ilean Skill, Psy.D.

## 2020-05-09 NOTE — Progress Notes (Signed)
Occupational Therapy Session Note  Patient Details  Name: Johnathan Barker MRN: [id number] Date of Birth: [birth year]  Today's Date: 05/09/2020 OT Individual Time: 11:08-12:01      Short Term Goals: Week 2:  OT Short Term Goal 1 (Week 2): Pt will don pants with min assist for standing balance OT Short Term Goal 2 (Week 2): Pt will complete toilet transfers min assist for improved postural control/sequencing OT Short Term Goal 3 (Week 2): Pt will be able to don shoes with setup assist (adaptive laces prn) OT Short Term Goal 4 (Week 2): Pt will completing toileting tasks (clothing management and hygiene) with min assist for standing balance  Skilled Therapeutic Interventions/Progress Updates:    Pt supine in bed with wife and son at bedside.  Pts wife reporting pt needs to wash up and get dressed. Telesitter placed on privacy mode for duration of session. 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  Pt re-oriented to place, time, and purpose with pt able to recall 3/3 with cueing. Pt unable to recall spinal precautions despite cues.  Pt completed supine to sit EOB while following spinal precautions with CGA.  Pt completed SPT EOB to w/c with RW with CGA. Pts son assisted in shaving pts beard with care around incision and NG tube under OT supervision.  Nursing disconnected NG tube.  Pt doffed shirt with SBA to protect NG.  Pt bathed UB with mod intermittent VCs to stay on task and sequence to next step due to pt perseverating on one body part at times and some apathy noted needing VCs to initiate bathing underarms. Min assist to bathe back.  Pt bathed LB with min assist for lower BLE and feet and pt bathed tops of BLE.  Pts wife reports brief just changed and periarea just cleaned by nursing.  Pt donned shorts with min assist to thread over feet and pt able to stand with CGA to pull over hips.  Socks and shoes donned with max assist due to time constraint.  Pt  up in w/c with family and nurse present, telesitter turned back on, seat belt alarm donned/on, call bell in reach.  Pt initially with confusion however as session progressed, pt appeared less confused and with improved orientation. Pt would benefit from training in use of AE for LB dressing and bathing to increase independence and comply with spinal precautions.  Therapy Documentation Precautions:  Precautions Precautions: Cervical, Back, Fall Precaution Comments: no brace needed Restrictions Weight Bearing Restrictions: No      Therapy/Group: Individual Therapy  Ezekiel Slocumb 05/09/2020, 4:52 PM

## 2020-05-09 NOTE — Progress Notes (Signed)
Modified Barium Swallow Progress Note  Patient Details  Name: Johnathan Barker MRN: [id number] Date of Birth: 11/21/1941  Today's Date: 05/09/2020  Modified Barium Swallow completed.  Full report located under Chart Review in the Imaging Section.  Brief recommendations include the following:  Clinical Impression    Pt demonstrate severe-moderate pharyngeal dysphagia No functional pharyngeal changes were noted compared to previous instrumental swallow study on 05/01/2020. Pt's pharyngeal s/p surgical edema and reduced pharyngeal space from Cortrak lead to decreased epiglottic inversion impacting laryngeal vestibule closure and decreased pharyngeal wall contraction, which resulted in valleculae and pyriform sinus residue. The consumption of thin liquids via TSP, cup sips and controlled 5cc cup sips, resulted in valleculae and pyriform sinus residue and penetration (PAS 2) before and during the swallow. When pt consumed larger thin liquid boluses via straw, penetration to vocal cords was noted (PAS 5) during the swallow, however obstruction of shoulder compromised view below the vocal cords. Pt's consumption of nectar thick liquids via cup resulted in penetration (PAS 3)  that remained present in laryngeal vestibule and nectar thick liquids via straw resulted in aspiration (PAS 7) during the swallow that was sensed but not successfully ejected with automatic and cued coughs. The weight of heaping TSP of puree textures improved epiglottic inversion and laryngeal vestibule closure, however also led to increased residue. Compensatory strategies, chin tuck, head turn to right (pt was limited by mobility to attempt left head turn), cued coughs/throat clears and effortful swallows, were not performed appropriately due to cognitive impairments. Primary obstacle to recovery remains edema s/p surgery. SLP recommends to continue nutrition via Cortrak. Continue water protocol and trials of puree at bedside with SLP  only. If pt demonstrated minimal clinical s/s aspiration during bedside trials over the next several days repeat MBSS is warranted to assess changes in pharyngeal edema prior to discharge from CIR. Enteral feeding will need to be discussed and considered if improvements are not observed.  Swallow Evaluation Recommendations    SLP Diet Recommendations: NPO      Compensations: Clear throat after each swallow;Multiple dry swallows after each bite/sip (water protocol.Trials of puree with SLP only)       Oral Care Recommendations: Oral care BID   Other Recommendations: Remove water pitcher;Have oral suction available    Viktoriya Glaspy 05/09/2020,5:39 PM

## 2020-05-09 NOTE — Progress Notes (Signed)
NAME:  CAITLIN HILLMER, MRN:  [id number], DOB:  06-12-1941, LOS: 86 ADMISSION DATE:  04/27/2020, CONSULTATION DATE: 6/15 REFERRING MD: patel , CHIEF COMPLAINT:  Cough and dyspnea   Brief History   79 year old male who is status post ACDF of cervical and thoracic spine for myelopathy.  Has had ongoing dysphagia since his admission to rehab on 6/4.  Just completed course of cefepime on 6/14 for pneumonia, pulmonary asked to evaluate for ongoing aspiration and abnormal chest x-ray History of present illness   79 year old male who was admitted to inpatient rehab following C3/4 and T 10/T11-L4 1/2 decompressive surgery which he underwent on 6/3. This was done in the setting of degenerative joint disease with worsening myelopathy.  Hospital course was complicated by confusion and disorientation.  Had residual postoperative weakness and mobility/gait disturbance and because of this was admitted to inpatient rehab. Rehab course: Admitted to inpatient rehab on 6/4 6/5 noted to have some respiratory difficulty with upper airway secretions.  Spiking temperature of 101, requiring young car suction to assist with oral secretions.  Seen by speech-language pathology with bedside evaluation raising concern for pharyngeal dysphagia, started on IV Flagyl and cefepime for possible aspiration 6/6 remaining n.p.o., getting nutrition and medications via nasogastric tube. 6/8: Still with airway congestion, poor cough mechanics.  More restless.  Requiring wrist restraints to keep him from pulling tube out 6/8 through 6/15: Ongoing cough.  Ongoing difficulty with upper airway maintenance.  Completed antibiotics on 6/14.  A follow-up chest x-ray was obtained due to ongoing congestion demonstrating possible right midlung airspace disease because of this critical care/pulmonary asked to evaluate Past Medical History  Myelopathy s/p cervical and thoracic decompression.  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   [facility]   Admitted to inpatient rehab on 6/4 6/5 noted to have some respiratory difficulty with upper airway secretions.  Spiking temperature of 101, requiring young car suction to assist with oral secretions.  Seen by speech-language pathology with bedside evaluation raising concern for pharyngeal dysphagia, started on IV Flagyl and cefepime for possible aspiration 6/6 remaining n.p.o., getting nutrition and medications via nasogastric tube. 6/8: Still with airway congestion, poor cough mechanics.  More restless.  Requiring wrist restraints to keep him from pulling tube out 6/8 through 6/15: Ongoing cough.  Ongoing difficulty with upper airway maintenance.  Completed antibiotics on 6/14.  A follow-up chest x-ray was obtained due to ongoing congestion demonstrating possible right midlung airspace disease because of this critical care/pulmonary asked to evaluate  Consults:  Pulmonary consult the 6/15  Procedures:    Significant Diagnostic Tests:  High-resolution CT scan of the chest 6/15 >> peripheral predominant groundglass with associated septal thickening reticulation.  No frank honeycomb change.  Mild craniocaudal gradient.  No other discrete infiltrates  Micro Data:  6/6 BCx2 staph homoinis  6/8 BCX2: neg  Antimicrobials:   cefepime 6/4>>>6/14  Interim history/subjective:  No significant interval change overnight  Objective   Blood pressure 118/61, pulse 80, temperature 97.7 F (36.5 C), temperature source Oral, resp. rate 18, height 6' (1.829 m), weight 92.5 kg, SpO2 97 %.        Intake/Output Summary (Last 24 hours) at 05/09/2020 1037 Last data filed at 05/09/2020 0241 Gross per 24 hour  Intake --  Output 310 ml  Net -310 ml   Filed Weights   04/27/20 1919  Weight: 92.5 kg    Examination: General: Elderly gentleman, somewhat debilitated, laying in bed in no  distress HENT: NG tube in place, oropharynx otherwise clear Lungs: Few  scattered inspiratory crackles, no wheezes Cardiovascular: Regular, distant, no murmur Abdomen: Nondistended with positive bowel sounds Extremities: No edema Neuro: Awake and interacts.  A bit confused.  Some dysarthria but he can be comprehended  Resolved Hospital Problem list     Assessment & Plan:  Dysphagia (mod to severe by MBS) Recurrent aspiration PNA  Cough  Nasal congestion  Possible occult reflux Acute encephalopathy  Myelopathy Steroid induced leukocytosis  Cough Suspect that his continued intermittent cough is due to his dysphagia, intermittent aspiration and difficulty managing his oral secretions.  Abnormal CT scan of the chest.  No new focal infiltrate to suggest a new evolving pneumonia.  He does have peripheral interstitial changes with some associated groundglass consistent with pneumonitis versus evolving ILD.  He has aspiration and has had recurrent respiratory infections, pneumonitis which is the most likely cause of these changes.  Needs to adhere to strict swallowing precautions, reflux precautions.  I will send screening autoimmune panel to look for any other potential contributors to ILD.  No clear evidence for an active aspiration pneumonia at this time although he is high risk for recurrence.  Continue to push pulmonary hygiene.  Best practice:  Per primary   Labs   CBC: Recent Labs  Lab 05/07/20 0701 05/08/20 0531  WBC 20.9* 21.1*  NEUTROABS  --  16.9*  HGB 14.8 13.6  HCT 44.9 41.4  MCV 91.8 91.2  PLT 357 932    Basic Metabolic Panel: Recent Labs  Lab 05/07/20 0701  NA 140  K 4.6  CL 102  CO2 30  GLUCOSE 219*  BUN 28*  CREATININE 0.93  CALCIUM 8.8*   GFR: Estimated Creatinine Clearance: 73 mL/min (by C-G formula based on SCr of 0.93 mg/dL). Recent Labs  Lab 05/07/20 0701 05/08/20 0531 05/08/20 1334 05/09/20 0549  PROCALCITON  --   --  <0.10 <0.10  WBC 20.9* 21.1*  --   --     Liver Function Tests: No results for input(s):  AST, ALT, ALKPHOS, BILITOT, PROT, ALBUMIN in the last 168 hours. No results for input(s): LIPASE, AMYLASE in the last 168 hours. No results for input(s): AMMONIA in the last 168 hours.  ABG No results found for: PHART, PCO2ART, PO2ART, HCO3, TCO2, ACIDBASEDEF, O2SAT   Coagulation Profile: No results for input(s): INR, PROTIME in the last 168 hours.  Cardiac Enzymes: No results for input(s): CKTOTAL, CKMB, CKMBINDEX, TROPONINI in the last 168 hours.  HbA1C: Hgb A1c MFr Bld  Date/Time Value Ref Range Status  04/24/2020 05:48 AM 7.5 (H) 4.8 - 5.6 % Final    Comment:    (NOTE) Pre diabetes:          5.7%-6.4% Diabetes:              >6.4% Glycemic control for   <7.0% adults with diabetes   08/23/2018 11:31 AM 6.6 (H) 4.8 - 5.6 % Final    Comment:    (NOTE)         Prediabetes: 5.7 - 6.4         Diabetes: >6.4         Glycemic control for adults with diabetes: <7.0     CBG: Recent Labs  Lab 05/08/20 1553 05/08/20 2025 05/09/20 0006 05/09/20 0357 05/09/20 0810  GLUCAP 241* 186* 209* 208* 187*    Baltazar Apo, MD, PhD 05/09/2020, 10:43 AM [facility] [phone] or if no answer  [phone]      

## 2020-05-09 NOTE — Plan of Care (Signed)
  Problem: Consults Goal: RH SPINAL CORD INJURY PATIENT EDUCATION Description:  See Patient Education module for education specifics.  Outcome: Progressing   Problem: SCI BOWEL ELIMINATION Goal: RH STG MANAGE BOWEL WITH ASSISTANCE Description: STG Manage Bowel with Min Assistance. Outcome: Progressing Goal: RH STG SCI MANAGE BOWEL WITH MEDICATION WITH ASSISTANCE Description: STG SCI Manage bowel with medication with Min assistance. Outcome: Progressing   Problem: SCI BLADDER ELIMINATION Goal: RH STG MANAGE BLADDER WITH ASSISTANCE Description: STG Manage Bladder With Min Assistance Outcome: Progressing   Problem: RH SKIN INTEGRITY Goal: RH STG SKIN FREE OF INFECTION/BREAKDOWN Description: No new breakdown with min assist  Outcome: Progressing Goal: RH STG ABLE TO PERFORM INCISION/WOUND CARE W/ASSISTANCE Description: STG Able To Perform Incision/Wound Care With [employer]. Outcome: Progressing   Problem: RH SAFETY Goal: RH STG ADHERE TO SAFETY PRECAUTIONS W/ASSISTANCE/DEVICE Description: STG Adhere to Safety Precautions With Min Assistance/Device. Outcome: Progressing   Problem: RH PAIN MANAGEMENT Goal: RH STG PAIN MANAGED AT OR BELOW PT'S PAIN GOAL Description: Pt will manage pain at 3 or less on a scale of 0-10.  Outcome: Progressing   Problem: RH KNOWLEDGE DEFICIT SCI Goal: RH STG INCREASE KNOWLEDGE OF SELF CARE AFTER SCI Description: Pt will verbalize increased knowledge of caring for self after SCI including pain management, precautions, and wound care with min assist using educational materials provided by staff.  Outcome: Progressing

## 2020-05-09 NOTE — Progress Notes (Signed)
Patient ID: Johnathan Barker, male   DOB: [birth year], 79 y.o.   MRN: [id number]  Team Conference Report to Patient/Family  Team Conference discussion was reviewed with the patient and caregiver, including goals, any changes in plan of care and target discharge date.  Patient and caregiver express understanding and are in agreement.  The patient has a target discharge date of 05/18/20.  Dyanne Iha 05/09/2020, 2:01 PM

## 2020-05-09 NOTE — Progress Notes (Signed)
[facility] PHYSICAL MEDICINE & REHABILITATION PROGRESS NOTE  Subjective/Complaints: Patient seen laying in bed this morning.  He states he slept well overnight.  No issues reported overnight.  He remains confused.  Discussed swallowing with therapies.  He was seen by Pulm yesterday, notes reviewed-hold antibiotics.  ROS: Limited due to cognition  Objective: Vital Signs: Blood pressure 118/61, pulse 80, temperature 97.7 F (36.5 C), temperature source Oral, resp. rate 18, height 6' (1.829 m), weight 92.5 kg, SpO2 97 %. DG Chest 2 View  Result Date: 05/08/2020 CLINICAL DATA:  Hypertension and cardiac arrhythmia EXAM: CHEST - 2 VIEW COMPARISON:  April 30, 2020 and April 28, 2020 FINDINGS: Right base is now clear. There is suspected mild fibrosis in the lung bases. There is new ill-defined opacity in the periphery of the right mid lung. Heart size and pulmonary vascularity are normal. No adenopathy. Feeding tube extends below the diaphragm. There is degenerative change in the thoracic spine. IMPRESSION: Airspace opacity in the periphery of the right mid lung concerning for developing pneumonia or possible aspiration. Mild fibrosis in the bases. Lungs elsewhere clear. Cardiac silhouette normal. No adenopathy. Electronically Signed   By: Lowella Grip III M.D.   On: 05/08/2020 10:07   CT Chest High Resolution  Result Date: 05/09/2020 CLINICAL DATA:  79 year old male with history of interstitial lung disease. EXAM: CT CHEST WITHOUT CONTRAST TECHNIQUE: Multidetector CT imaging of the chest was performed following the standard protocol without intravenous contrast. High resolution imaging of the lungs, as well as inspiratory and expiratory imaging, was performed. COMPARISON:  No priors. FINDINGS: Cardiovascular: Heart size is normal. There is no significant pericardial fluid, thickening or pericardial calcification. There is aortic atherosclerosis, as well as atherosclerosis of the great vessels of the  mediastinum and the coronary arteries, including calcified atherosclerotic plaque in the left main, left anterior descending, left circumflex and right coronary arteries. Calcifications of the aortic valve and mitral annulus. Mediastinum/Nodes: No pathologically enlarged mediastinal or hilar lymph nodes. Please note that accurate exclusion of hilar adenopathy is limited on noncontrast CT scans. Feeding tube present extending into the stomach. Esophagus is otherwise unremarkable in appearance. No axillary lymphadenopathy. Lungs/Pleura: High-resolution images demonstrate widespread areas of peripheral predominant ground-glass attenuation, septal thickening and subpleural reticulation in the lungs bilaterally. These findings have a mild craniocaudal gradient. No traction bronchiectasis or frank honeycombing. Inspiratory and expiratory imaging is unremarkable. No acute consolidative airspace disease. No pleural effusions. No definite suspicious appearing pulmonary nodules or masses are noted. Upper Abdomen: Several faintly calcified small are subcentimeter gallstones lying dependently in the gallbladder. Musculoskeletal: There are no aggressive appearing lytic or blastic lesions noted in the visualized portions of the skeleton. IMPRESSION: 1. The appearance of the lungs is compatible with interstitial lung disease, but the spectrum of findings is considered indeterminate for usual interstitial pneumonia (UIP) per current ats guidelines. Repeat high-resolution chest CT is suggested in 12 months to assess for temporal changes in the appearance of the lung parenchyma. 2. Aortic atherosclerosis, in addition to left main and 3 vessel coronary artery disease. Assessment for potential risk factor modification, dietary therapy or pharmacologic therapy may be warranted, if clinically indicated. 3. There are calcifications of the aortic valve and mitral annulus. Echocardiographic correlation for evaluation of potential valvular  dysfunction may be warranted if clinically indicated. 4. Cholelithiasis without evidence of acute cholecystitis at this time. Aortic Atherosclerosis (ICD10-I70.0). Electronically Signed   By: Vinnie Langton M.D.   On: 05/09/2020 08:10   Recent Labs  05/07/20 0701 05/08/20 0531  WBC 20.9* 21.1*  HGB 14.8 13.6  HCT 44.9 41.4  PLT 357 310   Recent Labs    05/07/20 0701  NA 140  K 4.6  CL 102  CO2 30  GLUCOSE 219*  BUN 28*  CREATININE 0.93  CALCIUM 8.8*    Physical Exam: BP 118/61 (BP Location: Right Arm)   Pulse 80   Temp 97.7 F (36.5 C) (Oral)   Resp 18   Ht 6' (1.829 m)   Wt 92.5 kg   SpO2 97%   BMI 27.66 kg/m   Constitutional: No distress . Vital signs reviewed. HENT: Normocephalic.  Atraumatic.  + NG. Eyes: EOMI. No discharge. Cardiovascular: No JVD. Respiratory: Normal effort.  No stridor. GI: Non-distended. Skin: Incision with edema C/D/I Psych: Confused. Musc: No edema in extremities.  No tenderness in extremities. Neurological: Alert  Motor: 4+-5/5 throughout, unchanged  Assessment/Plan: 1. 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  Physiatrist is providing close team supervision and 24 hour management of active medical problems listed below.  Physiatrist and rehab team continue to assess barriers to discharge/monitor patient progress toward functional and medical goals  Care Tool:  Bathing    Body parts bathed by patient: Right arm, Left arm, Chest, Abdomen, Face, Front perineal area, Right upper leg, Left upper leg   Body parts bathed by helper: Buttocks     Bathing assist Assist Level: Minimal Assistance - Patient > 75%     Upper Body Dressing/Undressing Upper body dressing   What is the patient wearing?: Pull over shirt    Upper body assist Assist Level: Contact Guard/Touching assist    Lower Body  Dressing/Undressing Lower body dressing      What is the patient wearing?: Incontinence brief, Pants     Lower body assist Assist for lower body dressing: Moderate Assistance - Patient 50 - 74%     Toileting Toileting Toileting Activity did not occur Landscape architect and hygiene only): Refused  Toileting assist Assist for toileting: Maximal Assistance - Patient 25 - 49%     Transfers Chair/bed transfer  Transfers assist     Chair/bed transfer assist level: Moderate Assistance - Patient 50 - 74%     Locomotion Ambulation   Ambulation assist      Assist level: Moderate Assistance - Patient 50 - 74% Assistive device: Walker-rolling Max distance: 165ft   Walk 10 feet activity   Assist  Walk 10 feet activity did not occur: Safety/medical concerns  Assist level: Moderate Assistance - Patient - 50 - 74% Assistive device: Walker-rolling   Walk 50 feet activity   Assist Walk 50 feet with 2 turns activity did not occur: Safety/medical concerns  Assist level: Moderate Assistance - Patient - 50 - 74% Assistive device: Walker-rolling    Walk 150 feet activity   Assist Walk 150 feet activity did not occur: Safety/medical concerns  Assist level: Moderate Assistance - Patient - 50 - 74% Assistive device: Walker-rolling    Walk 10 feet on uneven surface  activity   Assist Walk 10 feet on uneven surfaces activity did not occur: Safety/medical concerns         Wheelchair     Assist Will patient use wheelchair at discharge?:  (TBD) Type of Wheelchair: Manual Wheelchair activity did not occur: Safety/medical concerns  Wheelchair assist level: Supervision/Verbal cueing Max wheelchair distance: 158ft    Wheelchair 50  feet with 2 turns activity    Assist    Wheelchair 50 feet with 2 turns activity did not occur: Safety/medical concerns   Assist Level: Supervision/Verbal cueing   Wheelchair 150 feet activity     Assist Wheelchair 150  feet activity did not occur: Safety/medical concerns   Assist Level: Supervision/Verbal cueing      Medical Problem List and Plan: 1.  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  Continue CIR  IV Steroids started on 6/5, transition to p.o. on 6/10, decreased on 6/11, decreased further on 6/13, decreased again on 6/16  Team conference today to discuss current and goals and coordination of care, home and environmental barriers, and discharge planning with nursing, case manager, and therapies.  2.  Antithrombotics: -DVT/anticoagulation:  Mechanical: Sequential compression devices, below knee Bilateral lower extremities             -antiplatelet therapy: N/A 3. Pain Management: Tylenol prn. Does not like Oxycodone.  Ordered ultram prn  Appears controlled on 6/16  Monitor with increased exertion as well as cognition side effects 4. Mood: LCSW to follow for evaluation and support.              -antipsychotic agents: N/A 5. Neuropsych: This patient is not fully capable of making decisions on his own behalf.  Discussed with neuropsychology-will order MRI 6. Skin/Wound Care: Monitor wound for healing.  7. Fluids/Electrolytes/Nutrition: Encourage fluid intake. Monitor I/O.  8. HTN: Monitor BP -continue amlodipine, Irbesartan, and metoprolol.   Controlled on 6/16  Monitor with increased mobility 9.  Steroid-induced hyperglycemia on T2DM with hyperglycemia: Hgb A1c- 7.5. Monitor BS ac/hs. Used amaryl 4 mg bid and lantus 2 units HS.   Amaryl on hold at present   Lantus 10, increased to 14 on 6/13  Novolog 5 with meals and at bedtime  Elevated on 6/16, monitor with steroid taper 10. Leucocytosis: Likely secondary to steroids + PNA.               WBC 21.1 on 6/15, labs ordered for tomorrow  UA/urine no growth  Repeat chest x-ray personally reviewed, showing new right middle lobe  abnormality.  Afebrile  Continue to monitor 11. Constipation:              Adjust bowel meds as necessary 12. Hyperbilirubinemia:   Elevated on 6/5, continue to monitor 13. Pharyngeal dysphagia: ST to follow for evaluation.   N.p.o. with NG at present after MBS, water protocol  Continue tube feeds  DC IVF  Advance diet as tolerated 14.  Urinary retention             Improving 15.  Sepsis/aspiration pneumonia, associated delirium. Currently afebrile  Bcx + x 2 for staph hominis, discussed with pharmacy  See #10  Completed IV cefepime on 6/15  IV Flagyl DC'd after discussion with pharmacy  Repeat blood cultures no growth  Echo reviewed, no signs of vegetation, EF 55-60%  Appreciate pulmonary recs-hold abx  CT chest showing ILD versus interstitial pneumonia, follow-up in 12 months  Procalcitonin within normal limits 16.  Confusion  Likely steroid psychosis + PNA +/- postop  See #1, #15  Continue wrist restraints for safety/NG  MRI brain ordered  TSH ordered   LOS: 12 days A FACE TO FACE EVALUATION WAS PERFORMED  Kelise Kuch Lorie Phenix 05/09/2020, 10:14 AM

## 2020-05-09 NOTE — Patient Care Conference (Signed)
Inpatient RehabilitationTeam Conference and Plan of Care Update Date: 05/09/2020   Time: 2:27 PM    Patient Name: Johnathan Barker      Medical Record Number: [id number]  Date of Birth: 1941/10/01 Sex: Male         Room/Bed: 4W18C/4W18C-01 Payor Info: Payor: MEDICARE / Plan: MEDICARE PART A AND B / Product Type: *No Product type* /    Admit Date/Time:  04/27/2020  6:34 PM  Primary Diagnosis:  Myelopathy ([place])  Patient Active Problem List   Diagnosis Date Noted  . Cognitive communication deficit   . Cognitive and behavioral changes   . Confusion, postoperative   . Sepsis without acute organ dysfunction ([place])   . Aspiration pneumonia of right lower lobe ([place])   . Steroid-induced hyperglycemia   . Postoperative pain   . Urinary retention   . Dysphagia   . Constipation   . Type 2 diabetes mellitus with hyperglycemia, with long-term current use of insulin ([place])   . Benign essential HTN   . Post-operative pain   . Abnormal chest x-ray   . FUO (fever of unknown origin)   . 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 Controlled type 2 diabetes mellitus with hyperglycemia, with long-term current use of insulin ([place])   . Drug induced constipation   . Leucocytosis   . Hyperbilirubinemia   . Myelopathy ([place]) 04/26/2020  . Weakness of right lower extremity 04/24/2020  . Essential hypertension 04/24/2020  . Diabetes mellitus type 2 in nonobese ([place]) 04/24/2020  . Cord compression ([place]) 04/24/2020  . 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 Delice Lesch Care Coodinator Present: Nestor Lewandowsky, RN, BSN, CRRN;Christina Sampson Goon, BSW Nurse Present: Isla Pence, RN PT Present: Becky Sax, PT OT Present: Meriel Pica, OT SLP Present: Charolett Bumpers, SLP PPS Coordinator present : Ileana Ladd, PT     Current Status/Progress Goal Weekly Team Focus   Bowel/Bladder   Pt is incontinent of bowel and bladder, PVR and bladder scan q4-q6.  To become more continent.  Assess toileting needs q2   Swallow/Nutrition/ Hydration   NPO, [place], water protocol, Max A for carryover of swallow strategies  Min A - downgraded 6/15  MBS scheduled 6/16, carryover of swallow strategies with possible diet   ADL's   CGA UB self care, min A LB bathing, mod A LB dressing, max A toileting, mod A toilet transfers  Supervision  ADL training, functional transfers, awareness of deficits, dynamic standing balance, cognition - sequencing   Mobility   bed mobility supervision, transfers mod A, gait 32ft with RW mod A, WC mobility 154ft supervision  supervision overall, min A stairs  functional mobility/transfers, generalized strengthening, dynamic standing balance/coordination, ambulation, and endurance   Communication   conversation 90% with min A verbal cues, wetness of vocal quality has improved  Supervison A  speech inteligbility, awareness of fast rate   Safety/Cognition/ Behavioral Observations  Mod A  Min A - downgrade 6/15  education, basic problem solving, sustained attention, recall with aid and overall awareness   Pain   Pain level has been 2/10 for left shoulder pain.  To keep pain level below 2/10.  Assess pain q shift or prn.   Skin   Has MASD to scrotum; applying barrier cream and antifungal powder to area.  To promote healing and prevent further skin breakdown.  Assess skin q shift or prn.    Rehab  Goals Patient on target to meet rehab goals: Yes Rehab Goals Revised: patient on target with current goals *See Care Plan and progress notes for long and short-term goals.     Barriers to Discharge  Current Status/Progress Possible Resolutions Date Resolved   Nursing                  PT  Medical stability;Incontinence;Behavior  impulsive, confused, LE ataxia              OT                  SLP                Care Coordinator Lack of/limited  family support;Home environment access/layout Patient spouse would like patient SUP/CGA. 3 Steps with Left handed rails On target with current goals          Discharge Planning/Teaching Needs:  Goal to discharge home with spouse (Supervision/cga)  Will schedule with spouse Butch Penny, if needed   Team Discussion:  No major changes; continue to note cognitive issues/language of confusion, incontinent of B+B, ataxia of legs, impulsive. Continue treatment for pneumonia; pulmonary consult for other recommendations and neuropsych consult for recommendations.Patient requires mod assist for transfers and max cues for activity. Continue to note problems with attention, problem solving and edema still affecting swallowing. Discussed discharge plan and extension of LOS with treatment plan for swallowing issue in prep for discharge. Team requested family education 05/10/20   Revisions to Treatment Plan:  Goals downgraded and ELOS extended to 05/18/20 pending consult recommendations.    Medical Summary Current Status: Deficits with mobility, swallowing, transfers, cognition, self-care secondary to myelopathy s/p decompression of cervical and thoracic spine. Weekly Focus/Goal: Improve mobility, HTN, CBGs, WBCs, confusion, dysphagia  Barriers to Discharge: Behavior;Incontinence;IV antibiotics;Medical stability;Nutrition means   Possible Resolutions to Barriers: Therapies, advance diet as tolerated, wean steroids, follow labs, optimize DM/BP meds, pulm recs   Continued Need for Acute Rehabilitation Level of Care: The patient requires daily medical management by a physician with specialized training in physical medicine and rehabilitation for the following reasons: Direction of a multidisciplinary physical rehabilitation program to maximize functional independence : Yes Medical management of patient stability for increased activity during participation in an intensive rehabilitation regime.: Yes Analysis of  laboratory values and/or radiology reports with any subsequent need for medication adjustment and/or medical intervention. : Yes   I attest that I was present, lead the team conference, and concur with the assessment and plan of the team.   Dorien Chihuahua B 05/09/2020, 2:27 PM

## 2020-05-10 ENCOUNTER — Inpatient Hospital Stay (HOSPITAL_COMMUNITY): Payer: Medicare Other

## 2020-05-10 ENCOUNTER — Inpatient Hospital Stay (HOSPITAL_COMMUNITY): Payer: Medicare Other | Admitting: Occupational Therapy

## 2020-05-10 ENCOUNTER — Inpatient Hospital Stay (HOSPITAL_COMMUNITY): Payer: Medicare Other | Admitting: Speech Pathology

## 2020-05-10 DIAGNOSIS — R4587 Impulsiveness: Secondary | ICD-10-CM | POA: Insufficient documentation

## 2020-05-10 DIAGNOSIS — R41841 Cognitive communication deficit: Secondary | ICD-10-CM

## 2020-05-10 LAB — MPO/PR-3 (ANCA) ANTIBODIES
ANCA Proteinase 3: <3.5 U/mL (ref 0.0–3.5)
Myeloperoxidase Abs: <9 U/mL (ref 0.0–9.0)

## 2020-05-10 LAB — GLUCOSE, CAPILLARY
Glucose-Capillary: 126 mg/dL — ABNORMAL HIGH (ref 70–99)
Glucose-Capillary: 138 mg/dL — ABNORMAL HIGH (ref 70–99)
Glucose-Capillary: 172 mg/dL — ABNORMAL HIGH (ref 70–99)
Glucose-Capillary: 180 mg/dL — ABNORMAL HIGH (ref 70–99)
Glucose-Capillary: 188 mg/dL — ABNORMAL HIGH (ref 70–99)
Glucose-Capillary: 189 mg/dL — ABNORMAL HIGH (ref 70–99)
Glucose-Capillary: 200 mg/dL — ABNORMAL HIGH (ref 70–99)

## 2020-05-10 LAB — ALDOLASE: Aldolase: 7 U/L (ref 3.3–10.3)

## 2020-05-10 LAB — CBC
HCT: 43.6 % (ref 39.0–52.0)
Hemoglobin: 14.4 g/dL (ref 13.0–17.0)
MCH: 30.1 pg (ref 26.0–34.0)
MCHC: 33 g/dL (ref 30.0–36.0)
MCV: 91.2 fL (ref 80.0–100.0)
Platelets: 372 10*3/uL (ref 150–400)
RBC: 4.78 MIL/uL (ref 4.22–5.81)
RDW: 12.9 % (ref 11.5–15.5)
WBC: 22.3 10*3/uL — ABNORMAL HIGH (ref 4.0–10.5)
nRBC: 0 % (ref 0.0–0.2)

## 2020-05-10 LAB — SJOGRENS SYNDROME-B EXTRACTABLE NUCLEAR ANTIBODY: SSB (La) (ENA) Antibody, IgG: <0.2 AI (ref 0.0–0.9)

## 2020-05-10 LAB — ANTI-DNA ANTIBODY, DOUBLE-STRANDED: ds DNA Ab: <1 IU/mL (ref 0–9)

## 2020-05-10 LAB — ANGIOTENSIN CONVERTING ENZYME: Angiotensin-Converting Enzyme: 21 U/L (ref 14–82)

## 2020-05-10 LAB — SJOGRENS SYNDROME-A EXTRACTABLE NUCLEAR ANTIBODY: SSA (Ro) (ENA) Antibody, IgG: <0.2 AI (ref 0.0–0.9)

## 2020-05-10 LAB — ANA W/REFLEX IF POSITIVE: Anti Nuclear Antibody (ANA): NEGATIVE

## 2020-05-10 LAB — ANTI-SCLERODERMA ANTIBODY: Scleroderma (Scl-70) (ENA) Antibody, IgG: <0.2 AI (ref 0.0–0.9)

## 2020-05-10 LAB — RHEUMATOID FACTOR: Rheumatoid fact SerPl-aCnc: <10 IU/mL (ref 0.0–13.9)

## 2020-05-10 LAB — PROCALCITONIN: Procalcitonin: <0.1 ng/mL

## 2020-05-10 LAB — ANTI-JO 1 ANTIBODY, IGG: Anti JO-1: <0.2 AI (ref 0.0–0.9)

## 2020-05-10 NOTE — Progress Notes (Signed)
Speech Language Pathology Daily Session Note  Patient Details  Name: Johnathan Barker MRN: [id number] Date of Birth: September 27, 1941  Today's Date: 05/10/2020 SLP Individual Time: 0626-9485 SLP Individual Time Calculation (min): 58 min  Short Term Goals: Week 2: SLP Short Term Goal 1 (Week 2): Pt will consume therapeutic trials of ice chips and/or puree textures with minimal overt s/sx aspiration and Min A for use of swallow strategies. SLP Short Term Goal 2 (Week 2): Pt will demonstrate carryover and recall of new and/or daily information with Mod A for use of memory strateiges/aids. SLP Short Term Goal 3 (Week 2): Pt will sustain attention to functional tasks for 10 minute intervals with Min a cues for redirection. SLP Short Term Goal 4 (Week 2): Pt will demonstrate ability to problem solving basic familiar situations with Mod A verbal/visual cues. SLP Short Term Goal 5 (Week 2): Pt will detect functional errors with Mod A verbal/visual cues.  Skilled Therapeutic Interventions: Pt was seen for skilled ST targeting dysphagia and cognition. Pt very disoriented to time consistently throuhgout session, but when provided daily newspaper able to reoirient to time appropriate. Independently oriented to place. Pt sustained attention well with overall Min A verbal cues for redirection throughout session. He required Min-Mod A verbal and visual cues for problem solving and error awareness during a basic 3-step action card sequencing task. Max cues requiring for recall and implementation of swallow stratgies, as well as recall during delayed recall tasks throughout session.  Pt also accepted trials of thin H2O in accordance with water protocol. Mod A verbal and visual cues required for problem solving use of suction toothbrush as well as use of suction for secretion management during session. Pt consumed a total of ~5 oz thin H2O via cup (self fed) with immediate cough or throat clear in more than 50% of trials  (5/15). Recommend continue NPO for now, with water protocol in place. Pt left laying  in bed with alarm set and needs within reach, restraints reapplied and telesitter active. Continue per current plan of care.        Pain Pain Assessment Pain Scale: 0-10 Pain Score: 0-No pain  Therapy/Group: Individual Therapy  [name] 05/10/2020, 6:54 AM

## 2020-05-10 NOTE — Progress Notes (Signed)
Physical Therapy Session Note  Patient Details  Name: Johnathan Barker MRN: [id number] Date of Birth: 28-Jan-1941  Today's Date: 05/10/2020 PT Individual Time: 1300-1405 PT Individual Time Calculation (min): 65 min  and Today's Date: 05/10/2020 PT Missed Time: 10 Minutes Missed Time Reason: Patient fatigue  Short Term Goals: Week 1:  PT Short Term Goal 1 (Week 1): Pt will complete least restrictive transfer with min A consistently PT Short Term Goal 1 - Progress (Week 1): Progressing toward goal PT Short Term Goal 2 (Week 1): Pt will ambulate x 50 ft with LRAD and mod A PT Short Term Goal 2 - Progress (Week 1): Progressing toward goal PT Short Term Goal 3 (Week 1): Pt will maintain dynamic standing balance with min A x 5 min PT Short Term Goal 3 - Progress (Week 1): Progressing toward goal Week 2:  PT Short Term Goal 1 (Week 2): Pt will transfer bed<>chair with LRAD min A PT Short Term Goal 2 (Week 2): Pt will ambulate 32ft with LRAD mod A PT Short Term Goal 3 (Week 2): Pt will perform simulated car transfer with LRAD min A  Skilled Therapeutic Interventions/Progress Updates:   Received pt sitting in WC, pt agreeable to therapy, and denied any pain during session. [name] present at bedside. Session with emphasis on functional mobility/transfers, generalized strengthening, dynamic standing balance/coordination, ambulation, and improved activity tolerance. Pt continues to remain confused and verbose throughout sessions. Suggested going outside and pt agreeable but requested his wife to come along. Pt transported to gift shop in [facility] total A. Pt performed WC mobility >126ft using bilateral UEs throughout gift shop with supervision/min A to avoid running into obstacles. Practiced visual scanning for certain objects and dynamic sitting balance and reaching outside BOS for objects on shelves. Pt transported outside and ambulated 88ft x 1 and 83ft x 1 with RW min A over concrete and brick surfaces. Pt  with continued narrow BOS and flexed trunk and required cues to increase BOS while ambulating. Pt able to identify different types of flowers and colors and continued working on visual scanning while people watching outside. Pt transported to dayroom in [facility] total A and transferred WC<>Nustep stand<>pivot min A with cues for hand and foot placement when turning. Pt performed bilateral UE and LE strengthening on Nustep at workload 6 for 6 minutes with 2 rest breaks in between for a total of 392 steps. Pt requested to stop activity due to fatigue stating "I need to save energy for tomorrow". Pt declined any further activities and requested to return to his room to sit and look out the window. Therapist suggested seated activities but pt continued to declined. Pt transported back to room in [facility] total A. Concluded session with pt sitting in WC, needs within reach, and seatbelt alarm on. Bilateral wrist restraints donned. Missed 10 minutes of skilled physical therapy due to fatigue.   Therapy Documentation Precautions:  Precautions Precautions: Cervical, Back, Fall Precaution Comments: no brace needed Restrictions Weight Bearing Restrictions: No  Therapy/Group: Individual Therapy [name] PT, DPT   05/10/2020, 7:49 AM

## 2020-05-10 NOTE — Plan of Care (Signed)
  Problem: Consults Goal: RH SPINAL CORD INJURY PATIENT EDUCATION Description:  See Patient Education module for education specifics.  Outcome: Progressing   Problem: SCI BOWEL ELIMINATION Goal: RH STG MANAGE BOWEL WITH ASSISTANCE Description: STG Manage Bowel with Min Assistance. Outcome: Progressing Goal: RH STG SCI MANAGE BOWEL WITH MEDICATION WITH ASSISTANCE Description: STG SCI Manage bowel with medication with Min assistance. Outcome: Progressing   Problem: SCI BLADDER ELIMINATION Goal: RH STG MANAGE BLADDER WITH ASSISTANCE Description: STG Manage Bladder With Min Assistance Outcome: Progressing   Problem: RH SKIN INTEGRITY Goal: RH STG SKIN FREE OF INFECTION/BREAKDOWN Description: No new breakdown with min assist  Outcome: Progressing Goal: RH STG ABLE TO PERFORM INCISION/WOUND CARE W/ASSISTANCE Description: STG Able To Perform Incision/Wound Care With [employer]. Outcome: Progressing   Problem: RH SAFETY Goal: RH STG ADHERE TO SAFETY PRECAUTIONS W/ASSISTANCE/DEVICE Description: STG Adhere to Safety Precautions With Min Assistance/Device. Outcome: Progressing   Problem: RH PAIN MANAGEMENT Goal: RH STG PAIN MANAGED AT OR BELOW PT'S PAIN GOAL Description: Pt will manage pain at 3 or less on a scale of 0-10.  Outcome: Progressing   Problem: RH KNOWLEDGE DEFICIT SCI Goal: RH STG INCREASE KNOWLEDGE OF SELF CARE AFTER SCI Description: Pt will verbalize increased knowledge of caring for self after SCI including pain management, precautions, and wound care with min assist using educational materials provided by staff.  Outcome: Progressing

## 2020-05-10 NOTE — Progress Notes (Signed)
PCCM Interval Note  Labs reviewed >> ACE level 21 (normal), aldolase normal, RF negative   Suspect that his interstitial changes on CT chest are due to chronic recurrent aspiration. 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 Will follow along with you.   Baltazar Apo, MD, PhD 05/10/2020, 9:25 AM [facility] [phone] or if no answer [phone]

## 2020-05-10 NOTE — Progress Notes (Signed)
[place] PHYSICAL MEDICINE & REHABILITATION PROGRESS NOTE  Subjective/Complaints: Patient seen sitting up in bed this morning working with therapies.  He states he slept well overnight.  No reported issues overnight.  Discussed cognition with therapies.  He was seen by pulmonary yesterday, notes reviewed-autoimmune work-up ordered.  ROS: Limited due to cognition  Objective: Vital Signs: Blood pressure (!) 129/57, pulse 74, temperature 98 F (36.7 C), resp. rate 16, height 6' (1.829 m), weight 92.5 kg, SpO2 99 %. MR BRAIN W WO CONTRAST  Result Date: 05/09/2020 CLINICAL DATA:  Encephalopathy.  Recent spine surgery. EXAM: MRI HEAD WITHOUT AND WITH CONTRAST TECHNIQUE: Multiplanar, multiecho pulse sequences of the brain and surrounding structures were obtained without and with intravenous contrast. CONTRAST:  9 mL Gadavist COMPARISON:  04/24/2020 FINDINGS: The study is mildly motion degraded. Brain: No acute infarct, mass, midline shift, or extra-axial fluid collection is identified. 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 There is mild generalized cerebral atrophy. T2 hyperintensities elsewhere in the cerebral white matter bilaterally are unchanged and nonspecific but compatible with moderate chronic small vessel ischemic disease. No abnormal enhancement is identified. Vascular: Major intracranial arterial flow voids are preserved. Lack of a normal left sigmoid sinus flow void, unchanged and favored to reflect slow flow over occlusion. Skull and upper cervical spine: Unremarkable bone marrow signal. C3-4 ACDF. Sinuses/Orbits: Bilateral cataract extraction. Paranasal sinuses and mastoid air cells are clear. Other: None. IMPRESSION: 1. No acute intracranial abnormality. 2. 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 Electronically Signed   By: Logan Bores M.D.   On: 05/09/2020 16:47   CT Chest High Resolution  Result Date: 05/09/2020 CLINICAL DATA:  79 year old male with history of interstitial lung disease. EXAM: CT CHEST WITHOUT CONTRAST TECHNIQUE: Multidetector CT imaging of the chest was performed following the standard protocol without intravenous contrast. High resolution imaging of the lungs, as well as inspiratory and expiratory imaging, was performed. COMPARISON:  No priors. FINDINGS: Cardiovascular: Heart size is normal. There is no significant pericardial fluid, thickening or pericardial calcification. There is aortic atherosclerosis, as well as atherosclerosis of the great vessels of the mediastinum and the coronary arteries, including calcified atherosclerotic plaque in the left main, left anterior descending, left circumflex and right coronary arteries. Calcifications of the aortic valve and mitral annulus. Mediastinum/Nodes: No pathologically enlarged mediastinal or hilar lymph nodes. Please note that accurate exclusion of hilar adenopathy is limited on noncontrast CT scans. Feeding tube present extending into the stomach. Esophagus is otherwise unremarkable in appearance. No axillary lymphadenopathy. Lungs/Pleura: High-resolution images demonstrate widespread areas of peripheral predominant ground-glass attenuation, septal thickening and subpleural reticulation in the lungs bilaterally. These findings have a mild craniocaudal gradient. No traction bronchiectasis or frank honeycombing. Inspiratory and expiratory imaging is unremarkable. No acute consolidative airspace disease. No pleural effusions. No definite suspicious appearing pulmonary nodules or masses are noted. Upper Abdomen: Several faintly calcified small are subcentimeter gallstones lying dependently in the gallbladder. Musculoskeletal: There are no aggressive appearing  lytic or blastic lesions noted in the visualized portions of the skeleton. IMPRESSION: 1. The appearance of the lungs is compatible with interstitial lung disease, but the spectrum of findings is considered indeterminate for usual interstitial pneumonia (UIP) per current ats guidelines. Repeat high-resolution chest CT is suggested in 12 months to assess for  temporal changes in the appearance of the lung parenchyma. 2. Aortic atherosclerosis, in addition to left main and 3 vessel coronary artery disease. Assessment for potential risk factor modification, dietary therapy or pharmacologic therapy may be warranted, if clinically indicated. 3. There are calcifications of the aortic valve and mitral annulus. Echocardiographic correlation for evaluation of potential valvular dysfunction may be warranted if clinically indicated. 4. Cholelithiasis without evidence of acute cholecystitis at this time. Aortic Atherosclerosis (ICD10-I70.0). Electronically Signed   By: Vinnie Langton M.D.   On: 05/09/2020 08:10   Recent Labs    05/08/20 0531  WBC 21.1*  HGB 13.6  HCT 41.4  PLT 310   No results for input(s): NA, K, CL, CO2, GLUCOSE, BUN, CREATININE, CALCIUM in the last 72 hours.  Physical Exam: BP (!) 129/57 (BP Location: Left Arm)    Pulse 74    Temp 98 F (36.7 C)    Resp 16    Ht 6' (1.829 m)    Wt 92.5 kg    SpO2 99%    BMI 27.66 kg/m   Constitutional: No distress . Vital signs reviewed. HENT: Normocephalic.  Atraumatic.  + NG. Eyes: EOMI. No discharge. Cardiovascular: No JVD. Respiratory: Normal effort.  No stridor. GI: Non-distended. Skin: Incision with edema C/D/I Psych: Confused, tearful Musc: No edema in extremities.  No tenderness in extremities. Neurological: Alert and oriented x2 Motor: 4+-5/5 throughout, stable  Assessment/Plan: 1. 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  Physiatrist is providing close team supervision and 24 hour management of active medical problems listed below.  Physiatrist and rehab team continue to assess barriers to discharge/monitor patient progress toward functional and medical goals  Care Tool:  Bathing    Body parts bathed by patient: Right arm, Left arm, Chest, Abdomen, Right upper leg, Left upper leg, Face   Body parts bathed by helper: Right lower leg, Left lower leg Body parts n/a: Front perineal area, Buttocks   Bathing assist Assist Level: Minimal Assistance - Patient > 75%     Upper Body Dressing/Undressing Upper body dressing   What is the patient wearing?: Pull over shirt    Upper body assist Assist Level: Supervision/Verbal cueing    Lower Body Dressing/Undressing Lower body dressing      What is the patient wearing?: Pants     Lower body assist Assist for lower body dressing: Minimal Assistance - Patient > 75%     Toileting Toileting Toileting Activity did not occur Landscape architect and hygiene only): Refused  Toileting assist Assist for toileting: Maximal Assistance - Patient 25 - 49%     Transfers Chair/bed transfer  Transfers assist     Chair/bed transfer assist level: Moderate Assistance - Patient 50 - 74%     Locomotion Ambulation   Ambulation assist      Assist level: Minimal Assistance - Patient > 75% Assistive device: Walker-rolling Max distance: 171ft   Walk 10 feet activity   Assist  Walk 10 feet activity did not occur: Safety/medical concerns  Assist level: Minimal Assistance - Patient > 75% Assistive device: Walker-rolling   Walk 50 feet activity   Assist Walk 50 feet with 2 turns activity did not occur: Safety/medical concerns  Assist level: Minimal Assistance - Patient > 75% Assistive device: Walker-rolling    Walk 150 feet activity   Assist Walk 150 feet activity did not occur: Safety/medical concerns  Assist level:  Moderate Assistance - Patient - 50 - 74% Assistive device: Walker-rolling    Walk 10 feet on uneven surface  activity   Assist Walk 10 feet on uneven surfaces activity did not occur: Safety/medical concerns         Wheelchair     Assist Will patient use wheelchair at discharge?:  (TBD) Type of Wheelchair: Manual Wheelchair activity did not occur: Safety/medical concerns  Wheelchair assist level: Supervision/Verbal cueing Max wheelchair distance: 148ft    Wheelchair 50 feet with 2 turns activity    Assist    Wheelchair 50 feet with 2 turns activity did not occur: Safety/medical concerns   Assist Level: Supervision/Verbal cueing   Wheelchair 150 feet activity     Assist Wheelchair 150 feet activity did not occur: Safety/medical concerns   Assist Level: Supervision/Verbal cueing      Medical Problem List and Plan: 1.  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  Continue CIR  IV Steroids started on 6/5, transition to p.o. on 6/10, decreased on 6/11, decreased further on 6/13, decreased again on 6/16  MRI brain personally reviewed, unremarkable for acute process  Therapy notes reviewed-max assist for toileting 2.  Antithrombotics: -DVT/anticoagulation:  Mechanical: Sequential compression devices, below knee Bilateral lower extremities             -antiplatelet therapy: N/A 3. Pain Management: Tylenol prn. Does not like Oxycodone.  Ordered ultram prn  Appears controlled on 6/17  Monitor with increased exertion as well as cognition side effects 4. Mood: LCSW to follow for evaluation and support.   Telesitter for safety             -antipsychotic agents: N/A 5. Neuropsych: This patient is not fully capable of making decisions on his own behalf.  Discussed with neuropsychology-potential contributors of cognitive deficits 6. Skin/Wound Care: Monitor wound for  healing.  7. Fluids/Electrolytes/Nutrition: Encourage fluid intake. Monitor I/O.  8. HTN: Monitor BP -continue amlodipine, Irbesartan, and metoprolol.   Controlled on 6/17  Monitor with increased mobility 9.  Steroid-induced hyperglycemia on T2DM with hyperglycemia: Hgb A1c- 7.5. Monitor BS ac/hs. Used amaryl 4 mg bid and lantus 2 units HS.   Amaryl on hold at present   Lantus 10, increased to 14 on 6/13  Novolog 5 with meals and at bedtime  Labile, but?  Improving on 6/17, monitor with steroid taper 10. Leucocytosis: Likely secondary to steroids + PNA.               WBC 21.1 on 6/15, labs pending  UA/urine no growth  Repeat chest x-ray personally reviewed, showing new right middle lobe abnormality.  Remains afebrile  Continue to monitor 11. Constipation:              Adjust bowel meds as necessary 12. Hyperbilirubinemia:   Elevated on 6/5, continue to monitor 13. Pharyngeal dysphagia: ST to follow for evaluation.   N.p.o. with NG at present after repeat MBS, water protocol  Continue tube feeds  DC IVF  Advance diet as tolerated 14.  Urinary retention             Improving 15.  Sepsis/aspiration pneumonia, associated encephalopathy.   Bcx + x 2 for staph hominis, discussed with pharmacy  See #10  Completed IV cefepime on 6/15  IV Flagyl DC'd after discussion with pharmacy  Repeat blood cultures no growth  Echo reviewed, no signs of vegetation, EF 55-60%  Appreciate pulmonary recs-hold  abx  CT chest showing ILD versus interstitial pneumonia, follow-up in 12 months  Procalcitonin within normal limits on 6/17 16.  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

## 2020-05-10 NOTE — Progress Notes (Signed)
Occupational Therapy Session Note  Patient Details  Name: Johnathan Barker MRN: [id number] Date of Birth: 05-Jul-1941  Today's Date: 05/10/2020 OT Individual Time: 1100-1200 OT Individual Time Calculation (min): 60 min    Short Term Goals: Week 2:  OT Short Term Goal 1 (Week 2): Pt will don pants with min assist for standing balance OT Short Term Goal 2 (Week 2): Pt will complete toilet transfers min assist for improved postural control/sequencing OT Short Term Goal 3 (Week 2): Pt will be able to don shoes with setup assist (adaptive laces prn) OT Short Term Goal 4 (Week 2): Pt will completing toileting tasks (clothing management and hygiene) with min assist for standing balance Week 3:     Skilled Therapeutic Interventions/Progress Updates:    Patient in bed, wife present , he denies pain.  Soft wrist restraints removed.  Supine to sitting with CS.  Unsupported sitting with CS.  Sit to stand from bed surface min A.  Min a and moderate tactile cues for SPT with RW to w/c.  Completed bathing and dressing seated in w/c at sink.  UB bathing min A, OH shirt CS,  Shorts min A.  He requires mod A for clothing management in stance - min A to thread belt and fasten in stance (min A to maintain standing balance during this task)  Max A for footwear.   Completed design copy pipe tree puzzle in stance - max cues for puzzle completion, CGA/min A to maintain balance in stance.  Completed second puzzle seated at counter height - mod / max cues.  Coordination task with nuts/bolts without difficulty.  He remained seated in w/c at close of session - seat belt alarm set , call bell in reach - wife present, telesitter active.    Therapy Documentation Precautions:  Precautions Precautions: Cervical, Back, Fall Precaution Comments: no brace needed Restrictions Weight Bearing Restrictions: No General:   Vital Signs:  Pain: Pain Assessment Pain Scale: 0-10 Pain Score: 0-No pain  Therapy/Group:  Individual Therapy  Carlos Levering 05/10/2020, 12:29 PM

## 2020-05-10 NOTE — Progress Notes (Signed)
Patient ID: Johnathan Barker, male   DOB: [birth year], 79 y.o.   MRN: [id number]   SW received notification to call spouse. Sw followed up with patient spouse, just wanted to know updated d/c date. SW updated spouse of new d/c date of 6/25

## 2020-05-11 ENCOUNTER — Inpatient Hospital Stay (HOSPITAL_COMMUNITY): Payer: Medicare Other | Admitting: Occupational Therapy

## 2020-05-11 ENCOUNTER — Inpatient Hospital Stay (HOSPITAL_COMMUNITY): Payer: Medicare Other

## 2020-05-11 ENCOUNTER — Inpatient Hospital Stay (HOSPITAL_COMMUNITY): Payer: Medicare Other | Admitting: Physical Therapy

## 2020-05-11 ENCOUNTER — Inpatient Hospital Stay (HOSPITAL_COMMUNITY): Payer: Medicare Other | Admitting: Speech Pathology

## 2020-05-11 DIAGNOSIS — J849 Interstitial pulmonary disease, unspecified: Secondary | ICD-10-CM

## 2020-05-11 DIAGNOSIS — G479 Sleep disorder, unspecified: Secondary | ICD-10-CM

## 2020-05-11 LAB — GLUCOSE, CAPILLARY
Glucose-Capillary: 155 mg/dL — ABNORMAL HIGH (ref 70–99)
Glucose-Capillary: 200 mg/dL — ABNORMAL HIGH (ref 70–99)
Glucose-Capillary: 293 mg/dL — ABNORMAL HIGH (ref 70–99)
Glucose-Capillary: 213 mg/dL — ABNORMAL HIGH (ref 70–99)
Glucose-Capillary: 232 mg/dL — ABNORMAL HIGH (ref 70–99)

## 2020-05-11 LAB — CYCLIC CITRUL PEPTIDE ANTIBODY, IGG/IGA: CCP Antibodies IgG/IgA: 5 units (ref 0–19)

## 2020-05-11 LAB — BASIC METABOLIC PANEL
Anion gap: 9 (ref 5–15)
BUN: 30 mg/dL — ABNORMAL HIGH (ref 8–23)
CO2: 29 mmol/L (ref 22–32)
Calcium: 8.4 mg/dL — ABNORMAL LOW (ref 8.9–10.3)
Chloride: 99 mmol/L (ref 98–111)
Creatinine, Ser: 0.97 mg/dL (ref 0.61–1.24)
GFR calc Af Amer: >60 mL/min (ref >60)
GFR calc non Af Amer: >60 mL/min (ref >60)
Glucose, Bld: 295 mg/dL — ABNORMAL HIGH (ref 70–99)
Potassium: 4.7 mmol/L (ref 3.5–5.1)
Sodium: 137 mmol/L (ref 135–145)

## 2020-05-11 IMAGING — CT CT ABD-PELV W/ CM
2 of 5 series · 16 of 46 positions shown, 18 images · IV contrast (APPLIED)
Comparison: Abdominal radiograph dated [DATE] and CT scan of
the chest dated [DATE]

CLINICAL DATA: Right upper quadrant abdominal pain, fever, and
elevated white blood count. Positive Murphy's sign.

EXAM:
CT ABDOMEN AND PELVIS WITH CONTRAST
TECHNIQUE: Multidetector CT imaging of the abdomen and pelvis was performed
using the standard protocol following bolus administration of
intravenous contrast.
CONTRAST:  100mL OMNIPAQUE IOHEXOL 300 MG/ML  SOLN

[Series 3: abd/ pelvis 5.0 i30f 2 · axial · 0.89mm/px · z∈[-513,-78]mm · 13 of 99 slices shown, 15 images]
[im 6/99  soft-tissue]
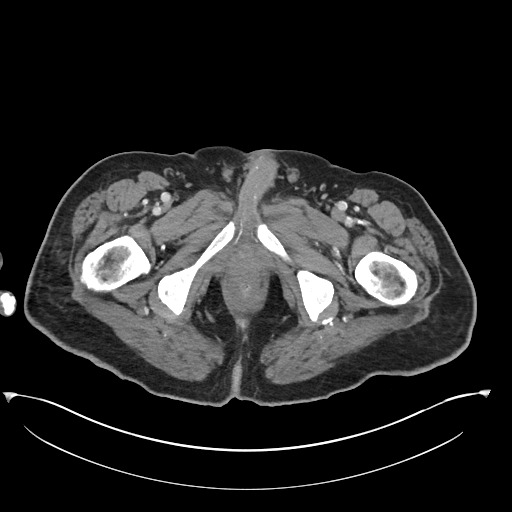
[im 6/99  bone]
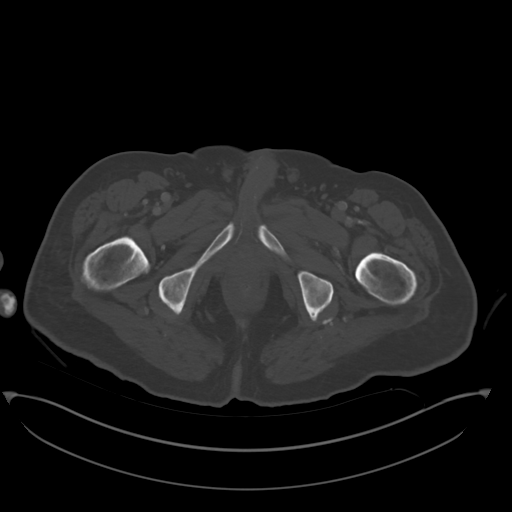
[im 12/99  soft-tissue]
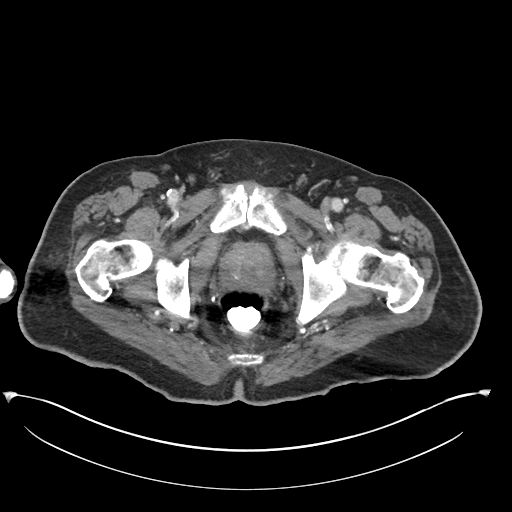
[im 24/99  soft-tissue]
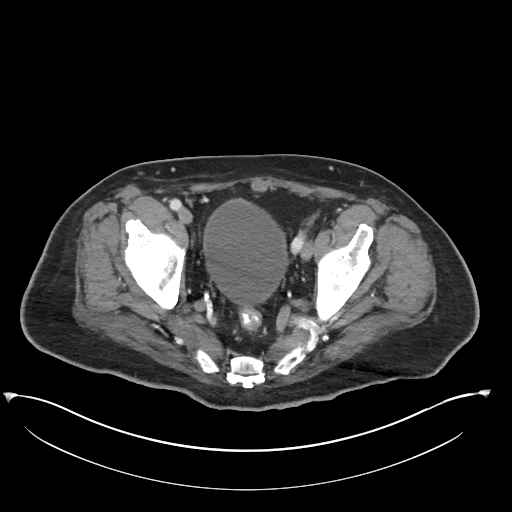
[im 29/99  soft-tissue]
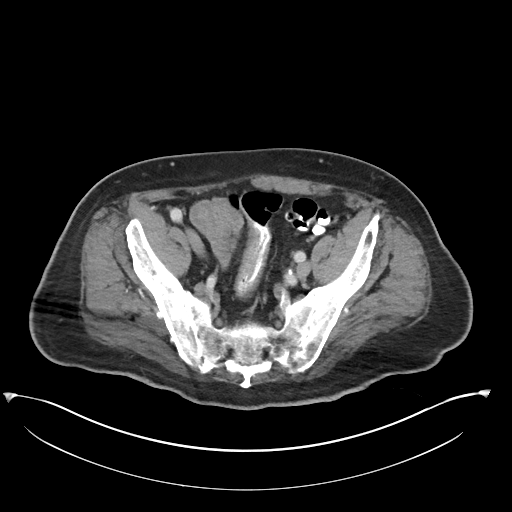
[im 35/99  soft-tissue]
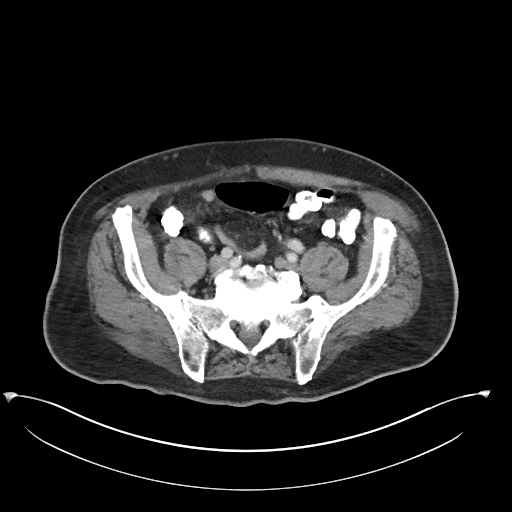
[im 41/99  soft-tissue]
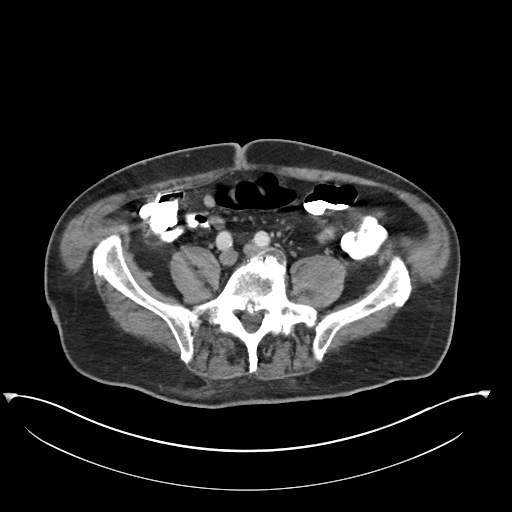
[im 52/99  soft-tissue]
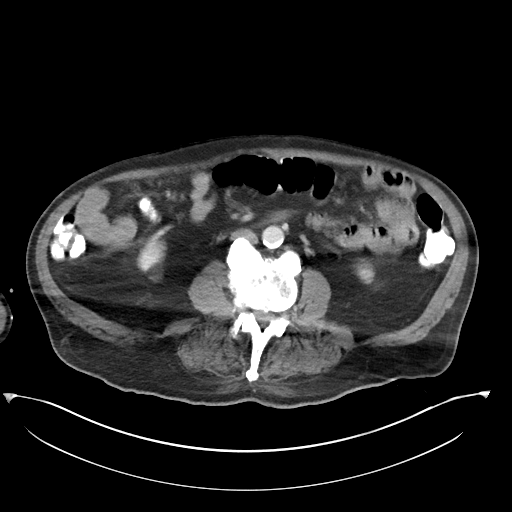
[im 58/99  soft-tissue]
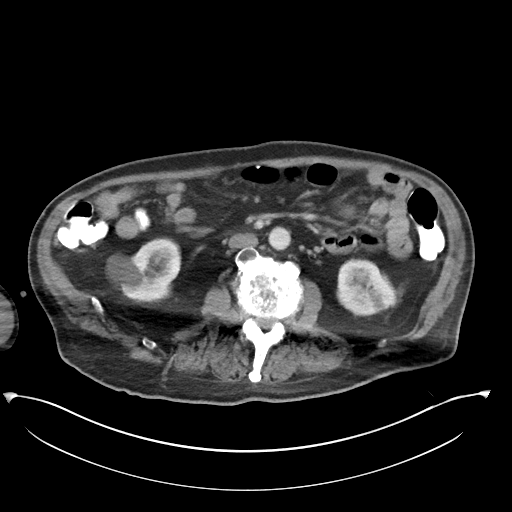
[im 64/99  soft-tissue]
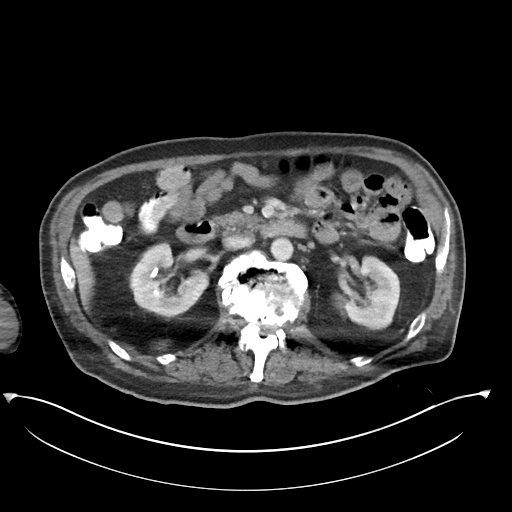
[im 64/99  bone]
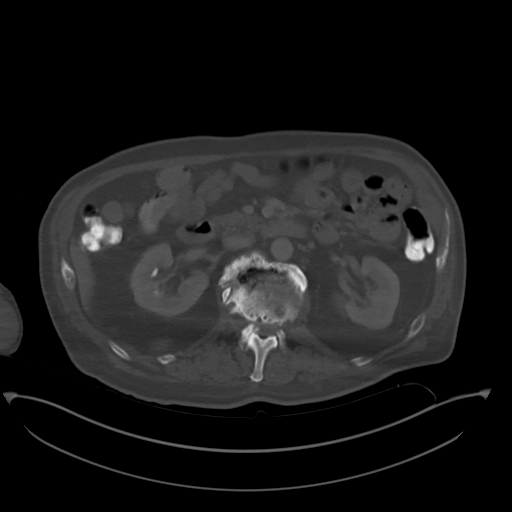
[im 70/99  soft-tissue]
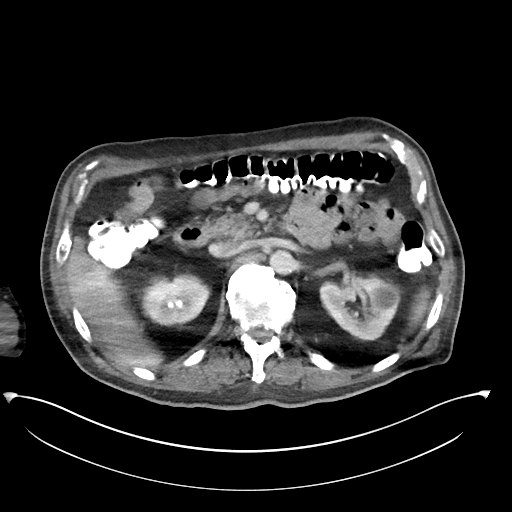
[im 75/99  soft-tissue]
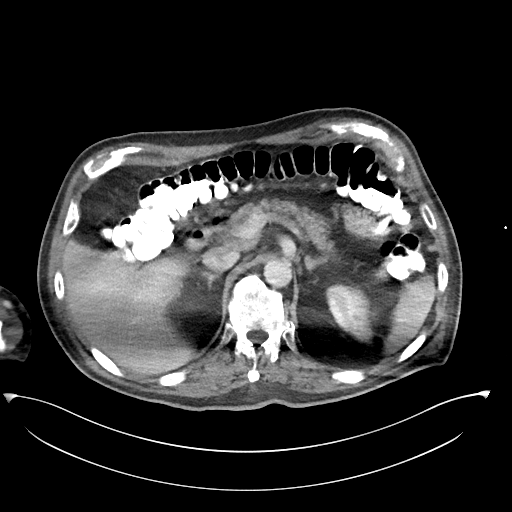
[im 87/99  soft-tissue]
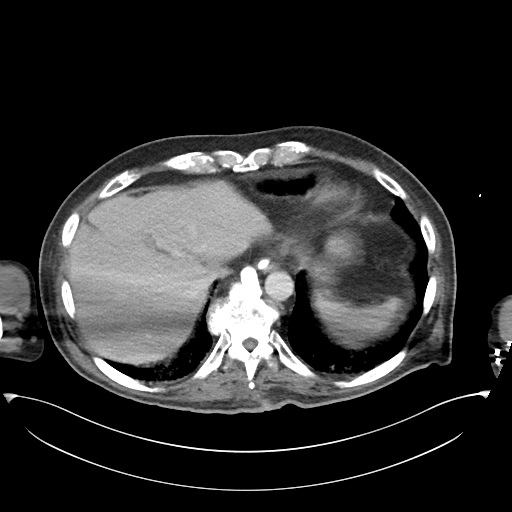
[im 93/99  soft-tissue]
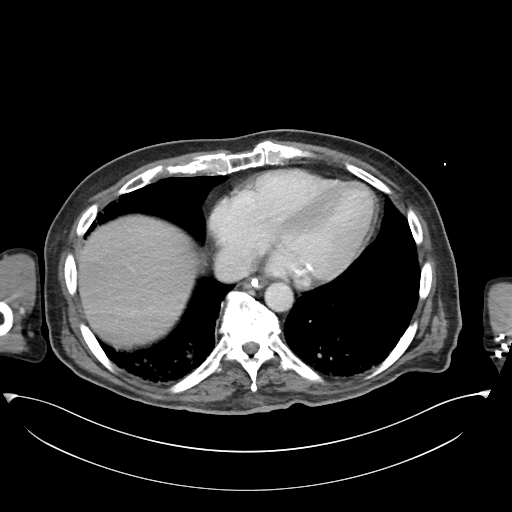

[Series 6: coronal soft tissue · coronal · 0.83mm/px · 3 of 93 slices shown]
[im 31/93  soft-tissue]
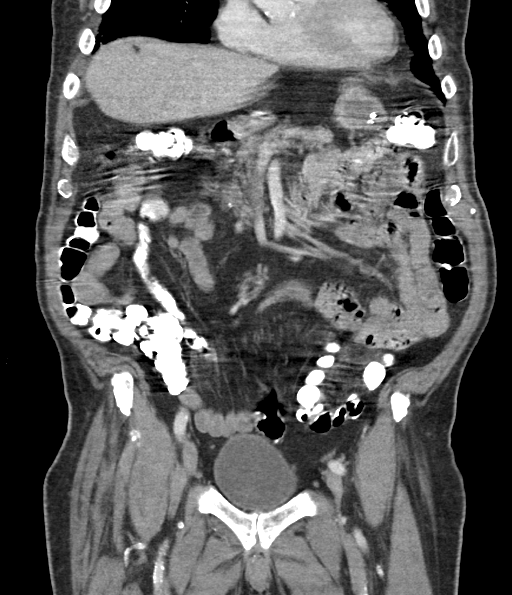
[im 41/93  soft-tissue]
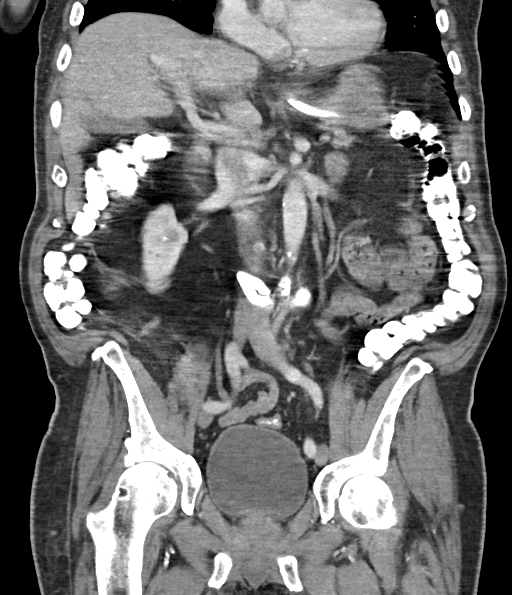
[im 52/93  soft-tissue]
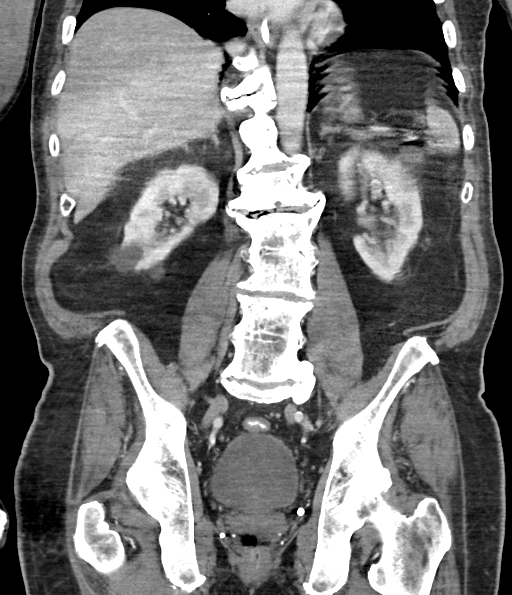

[16 of 46 positions shown; findings below may reference images not displayed]

FINDINGS: Lower chest: No acute abnormality. Chronic interstitial disease at
the lung bases posteriorly.

Hepatobiliary: Hepatic parenchyma is normal. Multiple gallstones.
The gallbladder is not distended. Biliary tree is not distended.

Pancreas: Unremarkable. No pancreatic ductal dilatation or
surrounding inflammatory changes.

Spleen: Normal in size without focal abnormality.

Adrenals/Urinary Tract: Normal adrenal glands. Multiple bilateral
renal calculi, more on the right the left. Bilateral renal cysts,
the largest on the left upper pole measuring 2.8 cm in the largest
on the right being on the lower pole measuring 3.6 cm. No
hydronephrosis. Bladder is normal.

Stomach/Bowel: Stomach is within normal limits. Appendix appears
normal. No evidence of bowel wall thickening, distention, or
inflammatory changes.

Vascular/Lymphatic: Aortic atherosclerosis. No enlarged abdominal or
pelvic lymph nodes.

Reproductive: Prostate is unremarkable.

Other: No abdominal wall hernia or abnormality. No abdominopelvic
ascites.

Musculoskeletal: Extensive degenerative changes of the spine.
Previous posterior decompression at T10-11 gas in the soft tissues
posterior to the thecal sac secondary to recent surgery on
[DATE].
IMPRESSION: 1. No acute abnormalities of the abdomen or pelvis. Postsurgical
changes at T10-11 secondary to recent surgery.
2. Cholelithiasis.
3. Multiple bilateral renal calculi.
4. Aortic atherosclerosis.

Aortic Atherosclerosis ([BA]-[BA]).

## 2020-05-11 MED ORDER — DIPHENHYDRAMINE-ZINC ACETATE 2-0.1 % EX CREA
TOPICAL_CREAM | Freq: Three times a day (TID) | CUTANEOUS | Status: DC | PRN
  Filled 2020-05-11: qty 28

## 2020-05-11 MED ORDER — IOHEXOL 300 MG/ML  SOLN
100.0000 mL | Freq: Once | INTRAMUSCULAR | Status: AC | PRN
  Administered 2020-05-11: 100 mL via INTRAVENOUS

## 2020-05-11 MED ORDER — MELATONIN 3 MG PO TABS
1.5000 mg | ORAL_TABLET | Freq: Every day | ORAL | Status: DC
  Administered 2020-05-11 – 2020-05-17 (×7): 1.5 mg via ORAL
  Filled 2020-05-11 (×7): qty 1

## 2020-05-11 MED ORDER — NON FORMULARY: 1.5000 mg | Freq: Every day | Status: DC

## 2020-05-11 NOTE — Plan of Care (Signed)
  Problem: Consults Goal: RH SPINAL CORD INJURY PATIENT EDUCATION Description:  See Patient Education module for education specifics.  Outcome: Progressing   Problem: SCI BOWEL ELIMINATION Goal: RH STG MANAGE BOWEL WITH ASSISTANCE Description: STG Manage Bowel with Min Assistance. Outcome: Progressing Goal: RH STG SCI MANAGE BOWEL WITH MEDICATION WITH ASSISTANCE Description: STG SCI Manage bowel with medication with Min assistance. Outcome: Progressing   Problem: SCI BLADDER ELIMINATION Goal: RH STG MANAGE BLADDER WITH ASSISTANCE Description: STG Manage Bladder With Min Assistance Outcome: Progressing   Problem: RH SKIN INTEGRITY Goal: RH STG SKIN FREE OF INFECTION/BREAKDOWN Description: No new breakdown with min assist  Outcome: Progressing Goal: RH STG ABLE TO PERFORM INCISION/WOUND CARE W/ASSISTANCE Description: STG Able To Perform Incision/Wound Care With [employer]. Outcome: Progressing   Problem: RH SAFETY Goal: RH STG ADHERE TO SAFETY PRECAUTIONS W/ASSISTANCE/DEVICE Description: STG Adhere to Safety Precautions With Min Assistance/Device. Outcome: Progressing   Problem: RH PAIN MANAGEMENT Goal: RH STG PAIN MANAGED AT OR BELOW PT'S PAIN GOAL Description: Pt will manage pain at 3 or less on a scale of 0-10.  Outcome: Progressing   Problem: RH KNOWLEDGE DEFICIT SCI Goal: RH STG INCREASE KNOWLEDGE OF SELF CARE AFTER SCI Description: Pt will verbalize increased knowledge of caring for self after SCI including pain management, precautions, and wound care with min assist using educational materials provided by staff.  Outcome: Progressing

## 2020-05-11 NOTE — Plan of Care (Signed)
°  Problem: RH Balance Goal: LTG Patient will maintain dynamic standing with ADLs (OT) Description: LTG:  Patient will maintain dynamic standing balance with assist during activities of daily living (OT)  Flowsheets (Taken 05/11/2020 1322) LTG: Pt will maintain dynamic standing balance during ADLs with: (downgraded for pt's safety) Contact Guard/Touching assist   Problem: RH Dressing Goal: LTG Patient will perform lower body dressing w/assist (OT) Description: LTG: Patient will perform lower body dressing with assist, with/without cues in positioning using equipment (OT) Flowsheets (Taken 05/11/2020 1322) LTG: Pt will perform lower body dressing with assistance level of: (downgraded for pt's safety) Contact Guard/Touching assist   Problem: RH Toilet Transfers Goal: LTG Patient will perform toilet transfers w/assist (OT) Description: LTG: Patient will perform toilet transfers with assist, with/without cues using equipment (OT) Flowsheets (Taken 05/11/2020 1322) LTG: Pt will perform toilet transfers with assistance level of: (downgraded for pt's safety) Contact Guard/Touching assist   Problem: RH Tub/Shower Transfers Goal: LTG Patient will perform tub/shower transfers w/assist (OT) Description: LTG: Patient will perform tub/shower transfers with assist, with/without cues using equipment (OT) Flowsheets (Taken 05/11/2020 1322) LTG: Pt will perform tub/shower stall transfers with assistance level of: (downgraded for pt's safety) Contact Guard/Touching assist

## 2020-05-11 NOTE — Progress Notes (Signed)
Speech Language Pathology Weekly Progress and Session Note  Patient Details  Name: Johnathan Barker MRN: [id number] Date of Birth: [birth year]  Beginning of progress report period: May 04, 2020 End of progress report period: May 11, 2020  Today's Date: 05/11/2020 SLP Individual Time: 7628-3151 SLP Individual Time Calculation (min): 44 min  Short Term Goals: Week 2: SLP Short Term Goal 1 (Week 2): Pt will consume therapeutic trials of ice chips and/or puree textures with minimal overt s/sx aspiration and Min A for use of swallow strategies. SLP Short Term Goal 1 - Progress (Week 2): Progressing toward goal SLP Short Term Goal 2 (Week 2): Pt will demonstrate carryover and recall of new and/or daily information with Mod A for use of memory strateiges/aids. SLP Short Term Goal 2 - Progress (Week 2): Progressing toward goal SLP Short Term Goal 3 (Week 2): Pt will sustain attention to functional tasks for 10 minute intervals with Min a cues for redirection. SLP Short Term Goal 3 - Progress (Week 2): Met SLP Short Term Goal 4 (Week 2): Pt will demonstrate ability to problem solving basic familiar situations with Mod A verbal/visual cues. SLP Short Term Goal 4 - Progress (Week 2): Met SLP Short Term Goal 5 (Week 2): Pt will detect functional errors with Mod A verbal/visual cues. SLP Short Term Goal 5 - Progress (Week 2): Progressing toward goal    New Short Term Goals: Week 3: SLP Short Term Goal 1 (Week 3): STG=LTG due to remaining LOS  Weekly Progress Updates: Pt has made very slow functional gains but met 2 out of 5 short term goals this admission. Pt is currently Mod-Max assist for due to severe pharyngeal dysphagia, altered mental status with cognitive deficits impacting his attention, problem solving, orientation, and short term memory. He has demonstrated some improvements in sustained attention and basic familiar problem solving skills this week, however still presents with language of  confusion at times and fluctuating levels of confusion. Pt is is still NPO with [place] for primary source of nutrition and medication administration, as well as water protocol. Repeat MBSS this week ultimately revealed little to no change in airway integrity across textures, and per SLP MBSS note, if improvements are not observed, medical team may need to explore enteral feeding options. Pt and family education is ongoing. Pt would continue to benefit from skilled ST while inpatient in order to maximize functional independence and reduce burden of care prior to discharge. Anticipate that pt will need 24/7 supervision at discharge in addition to [place] follow up at next level of care.      Intensity: Minumum of 1-2 x/day, 30 to 90 minutes Frequency: 3 to 5 out of 7 days Duration/Length of Stay: 05/18/20 Treatment/Interventions: Cognitive remediation/compensation;Cueing hierarchy;Dysphagia/aspiration precaution training;Functional tasks;Patient/family education;Internal/external aids;Speech/Language facilitation   Daily Session  Skilled Therapeutic Interventions: Pt was seen for skilled ST targeting dysphagia and cognition. [name] was present at bedside throughout session. SLP facilitated session with verbal cues for redirection to functional tasks throughout session - specifically, Min A cues for first 15 minutes and more Mod A cueing for remaining 30 minutes of session. Pt was independently oriented to time and place today and able to use external aid posted in room to recall swallow strategies without cueing beyond a verbal question prompt. However, pt did require Moderate verbal cues to execute swallow strategy sequence throughout PO intake. After pt performed thorough oral care with suction toothbrush, he accepted ~6oz thin H2O with only 2 immediate coughs observed.  Due to improvements at bedside, SLP also provided upgraded trials of puree solids (applesauce), during which pt did not exhibit any overt  s/sx aspiration. Pt occasionally grimaces during PO intake, and he indicates this is not in response to pain sensation, and describes it in a manner that may be indicative of globus sensation or sensation of pharyngeal residue. Recommend pt continue NPO with water protocol in place, with potential to repeat MBSS next week to reassess pharyngeal swallow function prior to d/c. Pt left sitting in recliner with alarm set and needs within reach, restraints reapplied, telesitter active, and wife still present. Continue per current plan of care.       Pain Pain Assessment Pain Scale: 0-10 Pain Score: 0-No pain  Therapy/Group: Individual Therapy  [name] 05/11/2020, 7:30 AM

## 2020-05-11 NOTE — Progress Notes (Signed)
Occupational Therapy Weekly Progress Note  Patient Details  Name: Johnathan Barker MRN: [id number] Date of Birth: [birth year]  Beginning of progress report period: May 04, 2020 End of progress report period: May 11, 2020  Today's Date: 05/11/2020 OT Individual Time: 5053-9767 OT Individual Time Calculation (min): 60 min    Patient has met 3 of 4 short term goals.  Pt is making excellent progress with his mobility, ability to follow directions, balance.  He continues to need some A with his socks and shoes and min A for donning pants.   Patient continues to demonstrate the following deficits: ataxia and decreased coordination, decreased awareness, decreased problem solving, decreased safety awareness, decreased memory and delayed processing and decreased standing balance, decreased postural control and decreased balance strategies and therefore will continue to benefit from skilled OT intervention to enhance overall performance with BADL.  Patient progressing toward long term goals..  Plan of care revisions: LTGs revised from Supervision to [place] with his LB self care and transfers..   Problem: RH Balance Goal: LTG Patient will maintain dynamic standing with ADLs (OT) Description: LTG:  Patient will maintain dynamic standing balance with assist during activities of daily living (OT)  Flowsheets (Taken 05/11/2020 1322) LTG: Pt will maintain dynamic standing balance during ADLs with: (downgraded for pt's safety) Contact Guard/Touching assist   Problem: RH Dressing Goal: LTG Patient will perform lower body dressing w/assist (OT) Description: LTG: Patient will perform lower body dressing with assist, with/without cues in positioning using equipment (OT) Flowsheets (Taken 05/11/2020 1322) LTG: Pt will perform lower body dressing with assistance level of: (downgraded for pt's safety) Contact Guard/Touching assist   Problem: RH Toilet Transfers Goal: LTG Patient will perform toilet transfers  w/assist (OT) Description: LTG: Patient will perform toilet transfers with assist, with/without cues using equipment (OT) Flowsheets (Taken 05/11/2020 1322) LTG: Pt will perform toilet transfers with assistance level of: (downgraded for pt's safety) Contact Guard/Touching assist   Problem: RH Tub/Shower Transfers Goal: LTG Patient will perform tub/shower transfers w/assist (OT) Description: LTG: Patient will perform tub/shower transfers with assist, with/without cues using equipment (OT) Flowsheets (Taken 05/11/2020 1322) LTG: Pt will perform tub/shower stall transfers with assistance level of: (downgraded for pt's safety) Contact Guard/Touching assist     OT Short Term Goals Week 1:  OT Short Term Goal 1 (Week 1): Pt will complete toilet or BSC transfer with 1 assist without Stedy OT Short Term Goal 1 - Progress (Week 1): Met OT Short Term Goal 2 (Week 1): Pt will complete LB dressing sit<stand with Min balance assist OT Short Term Goal 2 - Progress (Week 1): Partly met OT Short Term Goal 3 (Week 1): Pt will complete one grooming task while standing or in supported standing position near the sink to improve balance OT Short Term Goal 3 - Progress (Week 1): Met Week 2:  OT Short Term Goal 1 (Week 2): Pt will don pants with min assist for standing balance OT Short Term Goal 1 - Progress (Week 2): Met OT Short Term Goal 2 (Week 2): Pt will complete toilet transfers min assist for improved postural control/sequencing OT Short Term Goal 2 - Progress (Week 2): Met OT Short Term Goal 3 (Week 2): Pt will be able to don shoes with setup assist (adaptive laces prn) OT Short Term Goal 3 - Progress (Week 2): Progressing toward goal OT Short Term Goal 4 (Week 2): Pt will completing toileting tasks (clothing management and hygiene) with min assist for standing balance OT  Short Term Goal 4 - Progress (Week 2): Met Week 3:  OT Short Term Goal 1 (Week 3): STGs = LTGs  Skilled Therapeutic  Interventions/Progress Updates:    Pt received in w/c sleeping but awoke with encouragement. Pt agreeable to therapy, but initially keeping his eyes closed.  To help his become more alert worked on general UE AROM which pt was able to do well.   For sit to stands, pt placing one hand on RW other on chair to push to stand with min A.  With cues for feet placement, and to push up with B hands to stand THEN touch the walker pt was able to rise to stand 4x from his w/c and 2x from the elevated toilet seat all with CGA only.  1x he did so with close S.  Worked on standing balance and tolerance exercises with wt shifts and "marching" feet in place.   Pt ambulated to toilet with RW with mod A due to LE ataxia and slight forward lean, but did not need any cues for making turns with RW.  Pt was able to unbutton his [place], unfasten belt, pull pants down while standing with min A and don everything with min for balance.  Pt tends to lock out his knees and place his weight over forefoot causing a slight posterior lean requiring min A to support his balance.  No cues needed with clothing management.    Pt ambulated back to room and chose to sit in recliner.  Belt alarm on, wrist restraints on, telesitter on.  Overall, pt was able to follow directions well, expressed self quite clearly today.  Some mild confusion at times.  Therapy Documentation Precautions:  Precautions Precautions: Cervical, Back, Fall Precaution Comments: no brace needed Restrictions Weight Bearing Restrictions: No   Pain:   no c/o pain   Therapy/Group: Individual Therapy  [place] 05/11/2020, 1:13 PM

## 2020-05-11 NOTE — Progress Notes (Signed)
Physical Therapy Weekly Progress Note  Patient Details  Name: Johnathan Barker MRN: [id number] Date of Birth: [birth year]  Beginning of progress report period: April 28, 2020 End of progress report period: May 11, 2020  Today's Date: 05/11/2020 PT Individual Time: 0175-1025 PT Individual Time Calculation (min): 43 min   Patient has met 3 of 3 short term goals. Pt continues to demonstrate progress towards long term goals. Pt currently able to perform bed mobility with supervision, sit<>stand and stand<>pivot transfers with min A, gait up to 153f with RW min/mod A for turning, 4 steps with 1 rail using a lateral stepping technique mod A, and WC mobility up to 1581fwith supervision. However, pt continues to be limited by cognitive deficits and confusion, decreased safety awareness and impulsivity, motor control/planning deficits, and LE ataxia (which has improved since eval). Pt also requires mod cues for technique and safety with mobility. Family education done on 6/16 with wife and son, however wife plans to be present for additional sessions for hands on practice.   Patient continues to demonstrate the following deficits muscle weakness, impaired timing and sequencing, ataxia, decreased coordination and decreased motor planning, decreased awareness, decreased problem solving, decreased safety awareness and decreased memory and decreased standing balance, decreased postural control and decreased balance strategies and therefore will continue to benefit from skilled PT intervention to increase functional independence with mobility.  Patient progressing toward long term goals..  Continue plan of care.  PT Short Term Goals Week 2:  PT Short Term Goal 1 (Week 2): Pt will transfer bed<>chair with LRAD min A PT Short Term Goal 1 - Progress (Week 2): Met PT Short Term Goal 2 (Week 2): Pt will ambulate 2568fith LRAD mod A PT Short Term Goal 2 - Progress (Week 2): Met PT Short Term Goal 3 (Week 2): Pt  will perform simulated car transfer with LRAD min A PT Short Term Goal 3 - Progress (Week 2): Met Week 3:  PT Short Term Goal 1 (Week 3): STG=LTG due to LOS  Skilled Therapeutic Interventions/Progress Updates:  Ambulation/gait training;Balance/vestibular training;Cognitive remediation/compensation;Community reintegration;Discharge planning;Disease management/prevention;DME/adaptive equipment instruction;Functional mobility training;Neuromuscular re-education;Pain management;Patient/family education;Psychosocial support;Splinting/orthotics;Stair training;Therapeutic Activities;Therapeutic Exercise;UE/LE Strength taining/ROM;UE/LE Coordination activities;Wheelchair propulsion/positioning   Today's Interventions: Received pt supine in bed, pt agreeable to therapy, and denied any pain during session. Session with emphasis on functional mobility/transfers, dressing, generalized strengthening, dynamic standing balance/coordination, and improved activity tolerance. Pt donned pants in supine with mod A and rolled to L and R with supervision with cues and use of bedrails to pull pants over hips. Doffed dirty socks and donned clean ones while supine with total A. Pt transferred supine<>sitting EOB with supervision, use of bedrails, and verbal cues for technique. Doffed gown and donned undershirt and pull over shirt while sitting EOB with min A. Donned shoes mod A and threaded belt through pants in sitting total A. Pt transferred sit<>stand with RW mod A with cues for hand placement and anterior weight shifting and required total A to fasten belt. Pt transferred bed<>WC stand<>pivot with RW min A with cues for turning technique and foot placement. Pt combed hair at sink while [facility]. MD present to briefly discuss NG tube. [facility] and performed bilateral LE strengthening on Kinetron at 20 cm/sec for 1 minute x 2 trials with back support targeting hamstrings and 1  minute x 2 trials without back support targeting quadriceps. Pt with increased difficulty maintaining R LE on  footplate requiring cues for repositioning. Pt transported back to room in [facility] total A. Concluded session with pt sitting in WC, needs within reach, and seatbelt alarm on. Bilateral wrist restraints donned.   Therapy Documentation Precautions:  Precautions Precautions: Cervical, Back, Fall Precaution Comments: no brace needed Restrictions Weight Bearing Restrictions: No  Therapy/Group: Individual Therapy [name] PT, DPT  05/11/2020, 7:29 AM

## 2020-05-11 NOTE — Progress Notes (Signed)
Physical Therapy Session Note  Patient Details  Name: Johnathan Barker MRN: [id number] Date of Birth: 1941-05-15  Today's Date: 05/11/2020 PT Individual Time: 2233-6122 PT Individual Time Calculation (min): 58 min   Short Term Goals: Week 3:  PT Short Term Goal 1 (Week 3): STG=LTG due to LOS  Skilled Therapeutic Interventions/Progress Updates:    Pt received sitting in recliner with his wife present and pt agreeable to therapy session though reports some fatigue. RN present to disconnect tube feedings. Stand pivot recliner>w/c using RW with min/mod assist for lifting and balance while turning - continues to demonstrate impaired LE coordination, cuing for sequencing.  Transported to/from gym in w/c for time management and energy conservation. Performed B LE coordination and dynamic gait challenge of lateral side stepping in // bars with 1 UE support, min assist for balance and cuing for LE placement as pt often stepping on top of his other foot (R LE seemed to be more impaired than L). Progressed to lateral side stepping in // bars through the agility ladder as an external target requiring increased motor coordination to step into the squares - pt required B UE support on bar for this and min assist for balance - continued to demonstrate LE coordination impairments and repeatedly stepping on top of his other foot. Pt reports feeling urge to use bathroom. Transported back to room in w/c. Stand pivot w/c<>BSC over toilet using grab bar, min assist for balance. Standing with UE support on grab bar LB clothing management with max assist. Continent of bladder and bowels. Seated in w/c cued to perform hand hygiene when pt spontaneously starts to put water on his head to wash his hair - therapist assisted with completing task then educated on original intended task. Gait training ~115ft using RW with heavy min assist for balance and +2 w/c follow to allow further walking distance - demonstrates impaired R LE  coordination with increased hip adduction during swing and poor eccentric control of LE advancement with it seeming to snap forward quickly - forward flexed posture with increased support on UEs. Transported back to room in w/c. L stand pivot to EOB using RW with min assist for balance and cuing for sequencing AD and LE stepping. Sit>supine with min assist for LE management into the bed. Pt left supine in bed, HOB elevated >30degress, bilateral soft wrist restraints donned, and bed alarm on.   Therapy Documentation Precautions:  Precautions Precautions: Cervical, Back, Fall Precaution Comments: no brace needed Restrictions Weight Bearing Restrictions: No  Pain: No reports of pain throughout session.   Therapy/Group: Individual Therapy  Tawana Scale, PT, DPT 05/11/2020, 4:09 PM

## 2020-05-11 NOTE — Progress Notes (Signed)
[facility] PHYSICAL MEDICINE & REHABILITATION PROGRESS NOTE  Subjective/Complaints: Patient seen laying in bed this morning.  He states he slept well overnight.  No reported issues overnight.  He was seen by pulmonology yesterday, notes reviewed-autoimmune work-up pending.  ROS: Limited due to cognition  Objective: Vital Signs: Blood pressure 131/66, pulse 70, temperature 97.9 F (36.6 C), temperature source Oral, resp. rate 17, height 6' (1.829 m), weight 92.5 kg, SpO2 97 %. MR BRAIN W WO CONTRAST  Result Date: 05/09/2020 CLINICAL DATA:  Encephalopathy.  Recent spine surgery. EXAM: MRI HEAD WITHOUT AND WITH CONTRAST TECHNIQUE: Multiplanar, multiecho pulse sequences of the brain and surrounding structures were obtained without and with intravenous contrast. CONTRAST:  9 mL Gadavist COMPARISON:  04/24/2020 FINDINGS: The study is mildly motion degraded. Brain: No acute infarct, mass, midline shift, or extra-axial fluid collection is identified. 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 There is mild generalized cerebral atrophy. T2 hyperintensities elsewhere in the cerebral white matter bilaterally are unchanged and nonspecific but compatible with moderate chronic small vessel ischemic disease. No abnormal enhancement is identified. Vascular: Major intracranial arterial flow voids are preserved. Lack of a normal left sigmoid sinus flow void, unchanged and favored to reflect slow flow over occlusion. Skull and upper cervical spine: Unremarkable bone marrow signal. C3-4 ACDF. Sinuses/Orbits: Bilateral cataract extraction. Paranasal sinuses and mastoid air cells are clear. Other: None. IMPRESSION: 1. No acute intracranial abnormality. 2. 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 Electronically Signed   By: Logan Bores M.D.   On: 05/09/2020 16:47   Recent Labs    05/10/20 1416  WBC 22.3*  HGB 14.4  HCT 43.6  PLT 372   No results for input(s): NA, K, CL, CO2, GLUCOSE, BUN, CREATININE, CALCIUM in the last 72 hours.  Physical Exam: BP 131/66   Pulse 70   Temp 97.9 F (36.6 C) (Oral)   Resp 17   Ht 6' (1.829 m)   Wt 92.5 kg   SpO2 97%   BMI 27.66 kg/m   Constitutional: No distress . Vital signs reviewed. HENT: Normocephalic.  Atraumatic.  + NG. Eyes: EOMI. No discharge. Cardiovascular: No JVD. Respiratory: Normal effort.  No stridor. GI: Non-distended. Skin: Incision with edema C/D/I Psych: Confused Musc: No edema in extremities.  No tenderness in extremities. Neurological: Alert and oriented, except for date of month and president. Wet voice Motor: 4+-5/5 throughout, unchanged  Assessment/Plan: 1. 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  Physiatrist is providing close team supervision and 24 hour management of active medical problems listed below.  Physiatrist and rehab team continue to assess barriers to discharge/monitor patient progress toward functional and medical goals  Care Tool:  Bathing    Body parts bathed by patient: Right arm, Left arm, Chest, Abdomen, Right upper leg, Left upper leg, Face   Body parts bathed by helper: Right lower leg, Left lower leg Body parts n/a: Front perineal area, Buttocks   Bathing assist Assist Level: Minimal Assistance - Patient > 75%     Upper Body Dressing/Undressing Upper body dressing   What is the patient wearing?: Pull over shirt    Upper body assist Assist Level: Supervision/Verbal cueing    Lower  Body Dressing/Undressing Lower body dressing      What is the patient wearing?: Pants     Lower body assist  Assist for lower body dressing: Minimal Assistance - Patient > 75%     Toileting Toileting Toileting Activity did not occur Landscape architect and hygiene only): Refused  Toileting assist Assist for toileting: Maximal Assistance - Patient 25 - 49%     Transfers Chair/bed transfer  Transfers assist     Chair/bed transfer assist level: Moderate Assistance - Patient 50 - 74%     Locomotion Ambulation   Ambulation assist      Assist level: Minimal Assistance - Patient > 75% Assistive device: Walker-rolling Max distance: 13ft   Walk 10 feet activity   Assist  Walk 10 feet activity did not occur: Safety/medical concerns  Assist level: Minimal Assistance - Patient > 75% Assistive device: Walker-rolling   Walk 50 feet activity   Assist Walk 50 feet with 2 turns activity did not occur: Safety/medical concerns  Assist level: Minimal Assistance - Patient > 75% Assistive device: Walker-rolling    Walk 150 feet activity   Assist Walk 150 feet activity did not occur: Safety/medical concerns  Assist level: Moderate Assistance - Patient - 50 - 74% Assistive device: Walker-rolling    Walk 10 feet on uneven surface  activity   Assist Walk 10 feet on uneven surfaces activity did not occur: Safety/medical concerns         Wheelchair     Assist Will patient use wheelchair at discharge?:  (TBD) Type of Wheelchair: Manual Wheelchair activity did not occur: Safety/medical concerns  Wheelchair assist level: Supervision/Verbal cueing Max wheelchair distance: 134ft    Wheelchair 50 feet with 2 turns activity    Assist    Wheelchair 50 feet with 2 turns activity did not occur: Safety/medical concerns   Assist Level: Supervision/Verbal cueing   Wheelchair 150 feet activity     Assist Wheelchair 150 feet activity did not occur: Safety/medical concerns   Assist Level: Supervision/Verbal cueing      Medical Problem List and Plan: 1.  Deficits  with mobility, swallowing, transfers, cognition, self-care secondary to myelopathy s/p decompression of cervical and thoracic spine.  Continue CIR  IV Steroids started on 6/5, transition to p.o. on 6/10, decreased on 6/11, decreased further on 6/13, decreased again on 6/16, DC'd on 6/19  MRI brain personally reviewed, unremarkable for acute process  Wife's phone number number on file, attempted to contact, no answer-left voicemail 2.  Antithrombotics: -DVT/anticoagulation:  Mechanical: Sequential compression devices, below knee Bilateral lower extremities             -antiplatelet therapy: N/A 3. Pain Management: Tylenol prn. Does not like Oxycodone.  Ordered ultram prn  Appears controlled on 6/18  Monitor with increased exertion as well as cognition side effects 4. Mood: LCSW to follow for evaluation and support.   Telesitter for safety             -antipsychotic agents: N/A 5. Neuropsych: This patient is not fully capable of making decisions on his own behalf.  Discussed with neuropsychology-potential contributors of cognitive deficits, discussed imaging results 6. Skin/Wound Care: Monitor wound for healing.  7. Fluids/Electrolytes/Nutrition: Encourage fluid intake. Monitor I/O.   BMP pending 8. HTN: Monitor BP -continue amlodipine, Irbesartan, and metoprolol.   Controlled on 6/18  Monitor with increased mobility 9.  Steroid-induced hyperglycemia on T2DM with hyperglycemia: Hgb A1c- 7.5. Monitor BS ac/hs. Used amaryl 4 mg bid and lantus 2 units  HS.   Amaryl on hold at present   Lantus 10, increased to 14 on 6/13  Novolog 5 with meals and at bedtime  Elevated on 6/18, will consider medication adjustments now that steroids have been DC'd if persistently elevated. 10. Leucocytosis: Likely secondary to steroids + PNA.               WBC 22.3 on 6/17  UA/urine no growth  Repeat chest x-ray personally reviewed, showing new right middle lobe abnormality.  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  Remains afebrile  CT abdomen/pelvis ordered  Continue to monitor 11. Constipation:              Adjust bowel meds as necessary 12. Hyperbilirubinemia:   Elevated on 6/5, continue to monitor 13. Pharyngeal dysphagia: ST to follow for evaluation.   N.p.o. with NG at present after repeat MBS, water protocol  Continue tube feeds  DCed IVF  Advance diet as tolerated 14.  Urinary retention             Improving 15.  Sepsis/aspiration pneumonia, associated encephalopathy.   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  Confusion  Likely steroid psychosis + PNA +/- postop  See #1, #15, #17  Continue wrist restraints for safety/NG  TSH within normal limits 17.  Sleep disturbance  Trazodone DC'd, melatonin started on 6/18  Greater than 35 minutes spent in total in reviewing labs, imaging, contacting family, medical decision-making regarding aforementioned.   LOS: 14 days A FACE TO FACE EVALUATION WAS PERFORMED  Andersen Mckiver Lorie Phenix 05/11/2020, 10:54 AM

## 2020-05-11 NOTE — Progress Notes (Signed)
NAME:  Johnathan Barker, MRN:  [id number], DOB:  [birth year], LOS: 72 ADMISSION DATE:  04/27/2020, CONSULTATION DATE: 6/15 REFERRING MD: patel , CHIEF COMPLAINT:  Cough and dyspnea   Brief History   79 year old male who is status post ACDF of cervical and thoracic spine for myelopathy.  Has had ongoing dysphagia since his admission to rehab on 6/4.  Just completed course of cefepime on 6/14 for pneumonia, pulmonary asked to evaluate for ongoing aspiration and abnormal chest x-ray History of present illness   79 year old male who was admitted to inpatient rehab following C3/4 and T 10/T11-L4 1/2 decompressive surgery which he underwent on 6/3. This was done in the setting of degenerative joint disease with worsening myelopathy.  Hospital course was complicated by confusion and disorientation.  Had residual postoperative weakness and mobility/gait disturbance and because of this was admitted to inpatient rehab. Rehab course: Admitted to inpatient rehab on 6/4 6/5 noted to have some respiratory difficulty with upper airway secretions.  Spiking temperature of 101, requiring young car suction to assist with oral secretions.  Seen by speech-language pathology with bedside evaluation raising concern for pharyngeal dysphagia, started on IV Flagyl and cefepime for possible aspiration 6/6 remaining n.p.o., getting nutrition and medications via nasogastric tube. 6/8: Still with airway congestion, poor cough mechanics.  More restless.  Requiring wrist restraints to keep him from pulling tube out 6/8 through 6/15: Ongoing cough.  Ongoing difficulty with upper airway maintenance.  Completed antibiotics on 6/14.  A follow-up chest x-ray was obtained due to ongoing congestion demonstrating possible right midlung airspace disease because of this critical care/pulmonary asked to evaluate Past Medical History  Myelopathy s/p cervical and thoracic decompression.  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   [facility]   Admitted to inpatient rehab on 6/4 6/5 noted to have some respiratory difficulty with upper airway secretions.  Spiking temperature of 101, requiring young car suction to assist with oral secretions.  Seen by speech-language pathology with bedside evaluation raising concern for pharyngeal dysphagia, started on IV Flagyl and cefepime for possible aspiration 6/6 remaining n.p.o., getting nutrition and medications via nasogastric tube. 6/8: Still with airway congestion, poor cough mechanics.  More restless.  Requiring wrist restraints to keep him from pulling tube out 6/8 through 6/15: Ongoing cough.  Ongoing difficulty with upper airway maintenance.  Completed antibiotics on 6/14.  A follow-up chest x-ray was obtained due to ongoing congestion demonstrating possible right midlung airspace disease because of this critical care/pulmonary asked to evaluate  Consults:  Pulmonary consult the 6/15  Procedures:    Significant Diagnostic Tests:  High-resolution CT scan of the chest 6/15 >> peripheral predominant groundglass with associated septal thickening reticulation.  No frank honeycomb change.  Mild craniocaudal gradient.  No other discrete infiltrates  Autoimmune labs (ACE level, CCP, aldolase, anti-DNA antibody, ANCA, anti-Jo, anti-SCL, Sjogren's panel, ANA, RF) 6/16 >> all normal  Micro Data:  6/6 BCx2 staph homoinis  6/8 BCX2: neg  Antimicrobials:   cefepime 6/4>>>6/14  Interim history/subjective:   Working with PT on room air Still has an NG tube in place and on swallowing precautions  Objective   Blood pressure 131/66, pulse 70, temperature 97.9 F (36.6 C), temperature source Oral, resp. rate 17, height 6' (1.829 m), weight 92.5 kg, SpO2 97 %.        Intake/Output Summary (Last 24 hours) at 05/11/2020 0944 Last data filed at 05/11/2020 0508 Gross per 24 hour  Intake --  Output 1119 ml  Net -1119 ml   Filed Weights   04/27/20  1919  Weight: 92.5 kg    Examination: General: Elderly gentleman, somewhat debilitated, laying in bed in no distress HENT: NG tube in place, oropharynx otherwise clear Lungs: Few scattered inspiratory crackles, no wheezes Cardiovascular: Regular, distant, no murmur Abdomen: Nondistended with positive bowel sounds Extremities: No edema Neuro: Awake and interacts.  A bit confused.  Some dysarthria but he can be comprehended  Resolved Hospital Problem list     Assessment & Plan:  Dysphagia (mod to severe by MBS) Recurrent aspiration PNA  Cough  Nasal congestion  Possible occult reflux Acute encephalopathy  Myelopathy Steroid induced leukocytosis  Cough Suspect that his continued intermittent cough is due to his dysphagia, intermittent aspiration and difficulty managing his oral secretions.  Abnormal CT scan of the chest.  Peripheral ILD.  No overt honeycomb change yet.  Likely due to aspiration.  Needs to continue his swallowing and reflux precautions.  His entire autoimmune evaluation is reassuring.  Would be reasonable for him to follow-up in our ILD clinic at discharge (Dr. Chase Caller, Dr. Vaughan Browner).   Best practice:  Per primary   Labs   CBC: Recent Labs  Lab 05/07/20 0701 05/08/20 0531 05/10/20 1416  WBC 20.9* 21.1* 22.3*  NEUTROABS  --  16.9*  --   HGB 14.8 13.6 14.4  HCT 44.9 41.4 43.6  MCV 91.8 91.2 91.2  PLT 357 310 413    Basic Metabolic Panel: Recent Labs  Lab 05/07/20 0701  NA 140  K 4.6  CL 102  CO2 30  GLUCOSE 219*  BUN 28*  CREATININE 0.93  CALCIUM 8.8*   GFR: Estimated Creatinine Clearance: 73 mL/min (by C-G formula based on SCr of 0.93 mg/dL). Recent Labs  Lab 05/07/20 0701 05/08/20 0531 05/08/20 1334 05/09/20 0549 05/10/20 0719 05/10/20 1416  PROCALCITON  --   --  <0.10 <0.10 <0.10  --   WBC 20.9* 21.1*  --   --   --  22.3*    Liver Function Tests: No results for input(s): AST, ALT, ALKPHOS, BILITOT, PROT, ALBUMIN in the last  168 hours. No results for input(s): LIPASE, AMYLASE in the last 168 hours. No results for input(s): AMMONIA in the last 168 hours.  ABG No results found for: PHART, PCO2ART, PO2ART, HCO3, TCO2, ACIDBASEDEF, O2SAT   Coagulation Profile: No results for input(s): INR, PROTIME in the last 168 hours.  Cardiac Enzymes: No results for input(s): CKTOTAL, CKMB, CKMBINDEX, TROPONINI in the last 168 hours.  HbA1C: Hgb A1c MFr Bld  Date/Time Value Ref Range Status  04/24/2020 05:48 AM 7.5 (H) 4.8 - 5.6 % Final    Comment:    (NOTE) Pre diabetes:          5.7%-6.4% Diabetes:              >6.4% Glycemic control for   <7.0% adults with diabetes   08/23/2018 11:31 AM 6.6 (H) 4.8 - 5.6 % Final    Comment:    (NOTE)         Prediabetes: 5.7 - 6.4         Diabetes: >6.4         Glycemic control for adults with diabetes: <7.0     CBG: Recent Labs  Lab 05/10/20 1617 05/10/20 2033 05/10/20 2353 05/11/20 0346 05/11/20 0820  GLUCAP 200* 189* 172* 155* 200*    Baltazar Apo, MD, PhD 05/11/2020, 9:44 AM [facility]  [phone] or if no answer [phone]

## 2020-05-12 LAB — GLUCOSE, CAPILLARY
Glucose-Capillary: 100 mg/dL — ABNORMAL HIGH (ref 70–99)
Glucose-Capillary: 120 mg/dL — ABNORMAL HIGH (ref 70–99)
Glucose-Capillary: 155 mg/dL — ABNORMAL HIGH (ref 70–99)
Glucose-Capillary: 176 mg/dL — ABNORMAL HIGH (ref 70–99)
Glucose-Capillary: 215 mg/dL — ABNORMAL HIGH (ref 70–99)
Glucose-Capillary: 94 mg/dL (ref 70–99)

## 2020-05-12 NOTE — Progress Notes (Signed)
Johnathan Barker PHYSICAL MEDICINE & REHABILITATION PROGRESS NOTE  Subjective/Complaints: Reviewed Dr. Serita Grit note, speech therapy note, and CT abdomen results with wife and patient. Wife is apologetic that she missed Dr. Serita Grit call. Son is to visit tomorrow- I will meet with him and wife at 10:30. Wife is distressed about minimal improvement on MBS and whether to pursue PEG vs. Comfort feedings. Patient has high quality of life prior to admission and she states he very much enjoyed eating.   ROS: Limited due to cognition  Objective: Vital Signs: Blood pressure (!) 133/56, pulse 79, temperature 97.8 F (36.6 C), temperature source Oral, resp. rate 19, height 6' (1.829 m), weight 92.5 kg, SpO2 96 %. CT ABDOMEN PELVIS W CONTRAST  Result Date: 05/11/2020 CLINICAL DATA:  Right upper quadrant abdominal pain, fever, and elevated white blood count. Positive Murphy's sign. EXAM: CT ABDOMEN AND PELVIS WITH CONTRAST TECHNIQUE: Multidetector CT imaging of the abdomen and pelvis was performed using the standard protocol following bolus administration of intravenous contrast. CONTRAST:  193mL OMNIPAQUE IOHEXOL 300 MG/ML  SOLN COMPARISON:  Abdominal radiograph dated 04/28/2020 and CT scan of the chest dated 05/09/2019 FINDINGS: Lower chest: No acute abnormality. 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 Hepatobiliary: Hepatic parenchyma is normal. Multiple gallstones. The gallbladder is not distended. Biliary tree is not distended. Pancreas: Unremarkable. No pancreatic ductal dilatation or surrounding inflammatory changes. Spleen: Normal in size without focal abnormality. Adrenals/Urinary Tract: Normal adrenal glands. Multiple bilateral renal calculi, more on the right the left. Bilateral renal cysts, the largest on the left upper pole measuring 2.8 cm in the largest on the right being on the lower pole measuring 3.6 cm. No hydronephrosis. Bladder is normal. Stomach/Bowel: Stomach is within normal  limits. Appendix appears normal. No evidence of bowel wall thickening, distention, or inflammatory changes. Vascular/Lymphatic: Aortic atherosclerosis. No enlarged abdominal or pelvic lymph nodes. Reproductive: Prostate is unremarkable. Other: No abdominal wall hernia or abnormality. No abdominopelvic ascites. Musculoskeletal: Extensive degenerative changes of the spine. Previous posterior decompression at T10-11 gas in the soft tissues posterior to the thecal sac secondary to recent surgery on 04/26/2020. IMPRESSION: 1. No acute abnormalities of the abdomen or pelvis. Postsurgical changes at T10-11 secondary to recent surgery. 2. Cholelithiasis. 3. Multiple bilateral renal calculi. 4. Aortic atherosclerosis. Aortic Atherosclerosis (ICD10-I70.0). Electronically Signed   By: Lorriane Shire M.D.   On: 05/11/2020 20:32   Recent Labs    05/10/20 1416  WBC 22.3*  HGB 14.4  HCT 43.6  PLT 372   Recent Labs    05/11/20 1113  NA 137  K 4.7  CL 99  CO2 29  GLUCOSE 295*  BUN 30*  CREATININE 0.97  CALCIUM 8.4*    Physical Exam: BP (!) 133/56   Pulse 79   Temp 97.8 F (36.6 C) (Oral)   Resp 19   Ht 6' (1.829 m)   Wt 92.5 kg   SpO2 96%   BMI 27.66 kg/m   Gen: no distress, normal appearing HEENT: oral mucosa pink and moist, NCAT. +NGT Cardio: Reg rate Chest: normal effort, normal rate of breathing Abd: soft, non-distended Ext: no edema Skin: Incision with edema C/D/I Psych: Confused Musc: No edema in extremities.  No tenderness in extremities. Neurological: Alert and oriented, except for date of month and president. Wet voice Motor: 4+-5/5 throughout, unchanged  Assessment/Plan: 1. 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  Physiatrist is  providing close team supervision and 24 hour management of active medical problems listed below.  Physiatrist and  rehab team continue to assess barriers to discharge/monitor patient progress toward functional and medical goals  Care Tool:  Bathing    Body parts bathed by patient: Right arm, Left arm, Chest, Abdomen, Right upper leg, Left upper leg, Face   Body parts bathed by helper: Right lower leg, Left lower leg Body parts n/a: Front perineal area, Buttocks   Bathing assist Assist Level: Minimal Assistance - Patient > 75%     Upper Body Dressing/Undressing Upper body dressing   What is the patient wearing?: Pull over shirt    Upper body assist Assist Level: Supervision/Verbal cueing    Lower Body Dressing/Undressing Lower body dressing      What is the patient wearing?: Pants     Lower body assist Assist for lower body dressing: Minimal Assistance - Patient > 75%     Toileting Toileting Toileting Activity did not occur Landscape architect and hygiene only): Refused  Toileting assist Assist for toileting: Contact Guard/Touching assist     Transfers Chair/bed transfer  Transfers assist     Chair/bed transfer assist level: Minimal Assistance - Patient > 75% (stand pivot with RW) Chair/bed transfer assistive device: Programmer, multimedia   Ambulation assist      Assist level: Minimal Assistance - Patient > 75% Assistive device: Walker-rolling Max distance: 139ft   Walk 10 feet activity   Assist  Walk 10 feet activity did not occur: Safety/medical concerns  Assist level: Minimal Assistance - Patient > 75% Assistive device: Walker-rolling   Walk 50 feet activity   Assist Walk 50 feet with 2 turns activity did not occur: Safety/medical concerns  Assist level: Minimal Assistance - Patient > 75% Assistive device: Walker-rolling    Walk 150 feet activity   Assist Walk 150 feet activity did not occur: Safety/medical concerns  Assist level: Moderate Assistance - Patient - 50 - 74% Assistive device: Walker-rolling    Walk 10 feet on uneven  surface  activity   Assist Walk 10 feet on uneven surfaces activity did not occur: Safety/medical concerns         Wheelchair     Assist Will patient use wheelchair at discharge?:  (TBD) Type of Wheelchair: Manual Wheelchair activity did not occur: Safety/medical concerns  Wheelchair assist level: Supervision/Verbal cueing Max wheelchair distance: 146ft    Wheelchair 50 feet with 2 turns activity    Assist    Wheelchair 50 feet with 2 turns activity did not occur: Safety/medical concerns   Assist Level: Supervision/Verbal cueing   Wheelchair 150 feet activity     Assist Wheelchair 150 feet activity did not occur: Safety/medical concerns   Assist Level: Supervision/Verbal cueing      Medical Problem List and Plan: 1.  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  Continue CIR  IV Steroids started on 6/5, transition to p.o. on 6/10, decreased on 6/11, decreased further on 6/13, decreased again on 6/16, DC'd on 6/19  MRI brain personally reviewed, unremarkable for acute process  Wife's phone number number on file, attempted to contact, no answer-left voicemail 2.  Antithrombotics: -DVT/anticoagulation:  Mechanical: Sequential compression devices, below knee Bilateral lower extremities             -antiplatelet therapy: N/A 3. Pain Management: Tylenol prn. Does not like Oxycodone.  Ordered ultram prn  Appears controlled on 6/18  Monitor with increased exertion as well as cognition side effects 4. Mood: LCSW to follow for evaluation and support.   Telesitter for safety             -antipsychotic agents: N/A 5. Neuropsych: This patient is not fully capable of making decisions on his own behalf.  Discussed with neuropsychology-potential contributors of cognitive deficits, discussed imaging results 6. Skin/Wound Care: Monitor wound for healing.  7.  Fluids/Electrolytes/Nutrition: Encourage fluid intake. Monitor I/O.   BMP pending 8. HTN: Monitor BP -continue amlodipine, Irbesartan, and metoprolol.   Controlled on 6/19  Monitor with increased mobility 9.  Steroid-induced hyperglycemia on T2DM with hyperglycemia: Hgb A1c- 7.5. Monitor BS ac/hs. Used amaryl 4 mg bid and lantus 2 units HS.   Amaryl on hold at present   Lantus 10, increased to 14 on 6/13  Novolog 5 with meals and at bedtime  Elevated on 6/18, will consider medication adjustments now that steroids have been DC'd if persistently elevated. 10. Leucocytosis: Likely secondary to steroids + PNA.               WBC 22.3 on 6/17  UA/urine no growth  Repeat chest x-ray personally reviewed, showing new right middle lobe abnormality.  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  Remains afebrile  CT abdomen/pelvis results reviewed with wife- shows cholelithiasis, renal calculi, aortic atherosclerosis. Patient currently has no abdominal pain.   Continue to monitor 11. Constipation:              Adjust bowel meds as necessary 12. Hyperbilirubinemia:   Elevated on 6/5, continue to monitor 13. Pharyngeal dysphagia: ST to follow for evaluation.   N.p.o. with NG at present after repeat MBS, water protocol  Continue tube feeds  DCed IVF  Advance diet as tolerated  Will discuss diet options further with wife and her son on 6/20.  14.  Urinary retention             Improving 15.  Sepsis/aspiration pneumonia, associated encephalopathy.   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  Sleep disturbance  Trazodone DC'd, melatonin started on 6/18 18. Family discussion: 6/19: Reviewed Dr. Serita Grit note, speech therapy note, and CT abdomen results with wife and patient. Wife is apologetic that she missed Dr. Serita Grit call. Son is to visit tomorrow- I will meet with him and wife at 10:30am on 6/20. Wife is distressed about minimal improvement on MBS and whether to pursue PEG vs. Comfort feedings. Patient has high quality of life prior to admission and she states he very much enjoyed eating.   Greater than 35 minutes spent in total in reviewing imaging, physiatry, pulmonology, and therapy notes, and discussing these with patient's wife   LOS: 15 days A FACE TO [place] 05/12/2020, 2:12 PM

## 2020-05-13 ENCOUNTER — Inpatient Hospital Stay (HOSPITAL_COMMUNITY): Payer: Medicare Other

## 2020-05-13 LAB — GLUCOSE, CAPILLARY
Glucose-Capillary: 106 mg/dL — ABNORMAL HIGH (ref 70–99)
Glucose-Capillary: 130 mg/dL — ABNORMAL HIGH (ref 70–99)
Glucose-Capillary: 147 mg/dL — ABNORMAL HIGH (ref 70–99)
Glucose-Capillary: 153 mg/dL — ABNORMAL HIGH (ref 70–99)
Glucose-Capillary: 177 mg/dL — ABNORMAL HIGH (ref 70–99)
Glucose-Capillary: 75 mg/dL (ref 70–99)

## 2020-05-13 NOTE — Progress Notes (Signed)
Physical Therapy Session Note  Patient Details  Name: Johnathan Barker MRN: [id number] Date of Birth: [birth year]  Today's Date: 05/13/2020 PT Individual Time: 1445-1530 PT Individual Time Calculation (min): 45 min   Short Term Goals: Week 2:  PT Short Term Goal 1 (Week 2): Pt will transfer bed<>chair with LRAD min A PT Short Term Goal 1 - Progress (Week 2): Met PT Short Term Goal 2 (Week 2): Pt will ambulate 60f with LRAD mod A PT Short Term Goal 2 - Progress (Week 2): Met PT Short Term Goal 3 (Week 2): Pt will perform simulated car transfer with LRAD min A PT Short Term Goal 3 - Progress (Week 2): Met Week 3:  PT Short Term Goal 1 (Week 3): STG=LTG due to LOS  Skilled Therapeutic Interventions/Progress Updates:   Received pt supine in bed, pt agreeable to therapy, and denied any pain during session. [name] and son present at bedside. Session with emphasis on functional mobility/transfers, generalized strengthening, dynamic standing balance/coordination, ambulation, and improved activity tolerance. Pt donned pants in supine with mod A and transferred supine<>sitting EOB with HOB elevated and use of bedrails with supervision. Pt donned shoes max A for time management and doffed dirty shirt and donned clean one with min A. Pt transferred bed<>WC stand<>pivot without AD min A with cues for hand placement and stepping sequence when turning. Pt performed WC mobility 1075fusing bilateral UEs and supervision. [facility] and ambulated [id number] and [id number] with RW over uneven concrete surfaces with min A from therapist and from pt's son. Pt performed sit<>stand x2 and stand<>pivot x4 transfers from benches of various heights. Pt transported back to room in [facility] and son assisted with shaving pt while therapist set up recliner. Pt transferred WC<>recliner stand<>pivot without AD min A with pt's son with same verbal cues mentioned above. Concluded  session with pt sitting in recliner, needs within reach, and seatbelt alarm on. Family present at bedside.  Therapy Documentation Precautions:  Precautions Precautions: Cervical, Back, Fall Precaution Comments: no brace needed Restrictions Weight Bearing Restrictions: No  Therapy/Group: Individual Therapy Marriah Sanderlin Alfonse AlpersDPT   05/13/2020, 7:48 AM

## 2020-05-13 NOTE — Progress Notes (Signed)
PHYSICAL MEDICINE & REHABILITATION PROGRESS NOTE  Subjective/Complaints: Reviewed Dr. Serita Grit note, speech therapy note, and CT abdomen results with wife and son. Discussed feeding options and answered all questions. Johnathan Barker is alert and oriented x3 today. Asks to see his therapy schedule. Has no complaints. Denies pain. Afebrile.   ROS: Limited due to cognition  Objective: Vital Signs: Blood pressure (!) 134/55, pulse 77, temperature 98 F (36.7 C), resp. rate 18, height 6' (1.829 m), weight 92.5 kg, SpO2 96 %. CT ABDOMEN PELVIS W CONTRAST  Result Date: 05/11/2020 CLINICAL DATA:  Right upper quadrant abdominal pain, fever, and elevated white blood count. Positive Murphy's sign. EXAM: CT ABDOMEN AND PELVIS WITH CONTRAST TECHNIQUE: Multidetector CT imaging of the abdomen and pelvis was performed using the standard protocol following bolus administration of intravenous contrast. CONTRAST:  153mL OMNIPAQUE IOHEXOL 300 MG/ML  SOLN COMPARISON:  Abdominal radiograph dated 04/28/2020 and CT scan of the chest dated 05/09/2019 FINDINGS: Lower chest: No acute abnormality. 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 Hepatobiliary: Hepatic parenchyma is normal. Multiple gallstones. The gallbladder is not distended. Biliary tree is not distended. Pancreas: Unremarkable. No pancreatic ductal dilatation or surrounding inflammatory changes. Spleen: Normal in size without focal abnormality. Adrenals/Urinary Tract: Normal adrenal glands. Multiple bilateral renal calculi, more on the right the left. Bilateral renal cysts, the largest on the left upper pole measuring 2.8 cm in the largest on the right being on the lower pole measuring 3.6 cm. No hydronephrosis. Bladder is normal. Stomach/Bowel: Stomach is within normal limits. Appendix appears normal. No evidence of bowel wall thickening, distention, or inflammatory changes. Vascular/Lymphatic: Aortic atherosclerosis. No enlarged abdominal  or pelvic lymph nodes. Reproductive: Prostate is unremarkable. Other: No abdominal wall hernia or abnormality. No abdominopelvic ascites. Musculoskeletal: Extensive degenerative changes of the spine. Previous posterior decompression at T10-11 gas in the soft tissues posterior to the thecal sac secondary to recent surgery on 04/26/2020. IMPRESSION: 1. No acute abnormalities of the abdomen or pelvis. Postsurgical changes at T10-11 secondary to recent surgery. 2. Cholelithiasis. 3. Multiple bilateral renal calculi. 4. Aortic atherosclerosis. Aortic Atherosclerosis (ICD10-I70.0). Electronically Signed   By: Lorriane Shire M.D.   On: 05/11/2020 20:32   Recent Labs    05/10/20 1416  WBC 22.3*  HGB 14.4  HCT 43.6  PLT 372   Recent Labs    05/11/20 1113  NA 137  K 4.7  CL 99  CO2 29  GLUCOSE 295*  BUN 30*  CREATININE 0.97  CALCIUM 8.4*    Physical Exam: BP (!) 134/55   Pulse 77   Temp 98 F (36.7 C)   Resp 18   Ht 6' (1.829 m)   Wt 92.5 kg   SpO2 96%   BMI 27.66 kg/m   General: Alert and oriented x 3, No apparent distress HEENT: Head is normocephalic, atraumatic, PERRLA, EOMI, sclera anicteric, oral mucosa pink and moist, dentition intact, ext ear canals clear, +NGT Neck: Supple without JVD or lymphadenopathy Heart: Reg rate and rhythm. No murmurs rubs or gallops Chest: CTA bilaterally without wheezes, rales, or rhonchi; no distress Abdomen: Soft, non-tender, non-distended, bowel sounds positive. Extremities: No clubbing, cyanosis, or edema. Pulses are 2+ Skin: Incision with edema C/D/I Psych: Confused Musc: No edema in extremities.  No tenderness in extremities. Neurological: Alert and oriented. Wet voice Motor: 4+-5/5 throughout, unchanged   Assessment/Plan: 1. 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  Physiatrist is providing close team  supervision and 24 hour management of active medical problems listed below.  Physiatrist and rehab team continue to assess barriers to discharge/monitor patient progress toward functional and medical goals  Care Tool:  Bathing    Body parts bathed by patient: Right arm, Left arm, Chest, Abdomen, Right upper leg, Left upper leg, Face   Body parts bathed by helper: Right lower leg, Left lower leg Body parts n/a: Front perineal area, Buttocks   Bathing assist Assist Level: Minimal Assistance - Patient > 75%     Upper Body Dressing/Undressing Upper body dressing   What is the patient wearing?: Pull over shirt    Upper body assist Assist Level: Supervision/Verbal cueing    Lower Body Dressing/Undressing Lower body dressing      What is the patient wearing?: Pants     Lower body assist Assist for lower body dressing: Minimal Assistance - Patient > 75%     Toileting Toileting Toileting Activity did not occur Landscape architect and hygiene only): Refused  Toileting assist Assist for toileting: Contact Guard/Touching assist     Transfers Chair/bed transfer  Transfers assist     Chair/bed transfer assist level: Minimal Assistance - Patient > 75% (stand pivot with RW) Chair/bed transfer assistive device: Programmer, multimedia   Ambulation assist      Assist level: Minimal Assistance - Patient > 75% Assistive device: Walker-rolling Max distance: 168ft   Walk 10 feet activity   Assist  Walk 10 feet activity did not occur: Safety/medical concerns  Assist level: Minimal Assistance - Patient > 75% Assistive device: Walker-rolling   Walk 50 feet activity   Assist Walk 50 feet with 2 turns activity did not occur: Safety/medical concerns  Assist level: Minimal Assistance - Patient > 75% Assistive device: Walker-rolling    Walk 150 feet activity   Assist Walk 150 feet activity did not occur: Safety/medical concerns  Assist level: Moderate  Assistance - Patient - 50 - 74% Assistive device: Walker-rolling    Walk 10 feet on uneven surface  activity   Assist Walk 10 feet on uneven surfaces activity did not occur: Safety/medical concerns         Wheelchair     Assist Will patient use wheelchair at discharge?:  (TBD) Type of Wheelchair: Manual Wheelchair activity did not occur: Safety/medical concerns  Wheelchair assist level: Supervision/Verbal cueing Max wheelchair distance: 17ft    Wheelchair 50 feet with 2 turns activity    Assist    Wheelchair 50 feet with 2 turns activity did not occur: Safety/medical concerns   Assist Level: Supervision/Verbal cueing   Wheelchair 150 feet activity     Assist Wheelchair 150 feet activity did not occur: Safety/medical concerns   Assist Level: Supervision/Verbal cueing      Medical Problem List and Plan: 1.  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  Continue CIR  IV Steroids started on 6/5, transition to p.o. on 6/10, decreased on 6/11, decreased further on 6/13, decreased again on 6/16, DC'd on 6/19  MRI brain personally reviewed, unremarkable for acute process  Discussed notes and imaging with wife and son and answered questions. Would benefit from family meeting with Dr. Posey Pronto to discuss feeding options.  2.  Antithrombotics: -DVT/anticoagulation:  Mechanical: Sequential compression devices, below knee Bilateral lower extremities             -antiplatelet therapy: N/A 3. Pain Management:  Tylenol prn. Does not like Oxycodone.  Ordered ultram prn  Controlled 6/20  Monitor with increased exertion as well as cognition side effects 4. Mood: LCSW to follow for evaluation and support.   Renewed telesitter             -antipsychotic agents: N/A 5. Neuropsych: This patient is not fully capable of making decisions on his own behalf.  Discussed with  neuropsychology-potential contributors of cognitive deficits, discussed imaging results 6. Skin/Wound Care: Monitor wound for healing.  7. Fluids/Electrolytes/Nutrition: Encourage fluid intake. Monitor I/O.  8. HTN: Monitor BP -continue amlodipine, Irbesartan, and metoprolol.   Controlled 6/20.   Monitor with increased mobility 9.  Steroid-induced hyperglycemia on T2DM with hyperglycemia: Hgb A1c- 7.5. Monitor BS ac/hs. Used amaryl 4 mg bid and lantus 2 units HS.   Amaryl on hold at present   Lantus 10, increased to 14 on 6/13  Novolog 5 with meals and at bedtime  Elevated on 6/18, will consider medication adjustments now that steroids have been DC'd if persistently elevated.  6/20: mildly elevated, continue to monitor.  10. Leucocytosis: Likely secondary to steroids + PNA.               WBC 22.3 on 6/17  UA/urine no growth  Repeat chest x-ray personally reviewed, showing new right middle lobe abnormality.  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  Remains afebrile  CT abdomen/pelvis results reviewed with wife- shows cholelithiasis, renal calculi, aortic atherosclerosis. Patient currently has no abdominal pain.   Continue to monitor 11. Constipation:              Adjust bowel meds as necessary 12. Hyperbilirubinemia:   Elevated on 6/5, continue to monitor 13. Pharyngeal dysphagia: ST to follow for evaluation.   N.p.o. with NG at present after repeat MBS, water protocol  Continue tube feeds  DCed IVF  Advance diet as tolerated  Will discuss diet options further with wife and her son on 6/20.  14.  Urinary retention             Improving 15.  Sepsis/aspiration pneumonia, associated encephalopathy.   Bcx + x 2 for staph hominis, discussed with pharmacy, completed IV cefepime on 6/15  IV Flagyl DC'd after discussion with pharmacy  Repeat blood cultures no growth  Echo reviewed, no signs of vegetation, EF 55-60%  Appreciate pulmonary recs-hold abx  CT chest showing ILD versus interstitial  pneumonia, follow-up in 12 months  Procalcitonin within normal limits on 6/17  See #10 16.  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  Sleep disturbance  Trazodone DC'd, melatonin started on 6/18 18. Family discussion: 6/19-20: Reviewed Dr. Serita Grit note, speech therapy note, and CT abdomen results with wife, patient, and son. Wife is distressed about minimal improvement on MBS and whether to pursue PEG vs. Comfort feedings. Patient has high quality of life prior to admission and she states he very much enjoyed eating. Recommended family meeting with Dr. Posey Pronto and SLP this week to discuss further.    Greater than 35 minutes spent in total in reviewing imaging and notes with wife and son, and in evaluation of patient, his vitals, and labs.    LOS: 16 days A FACE TO FACE EVALUATION WAS PERFORMED  Clide Deutscher Deepti Gunawan 05/13/2020, 12:52 PM

## 2020-05-13 NOTE — Plan of Care (Signed)
  Problem: RH Bed to Chair Transfers Goal: LTG Patient will perform bed/chair transfers w/assist (PT) Description: LTG: Patient will perform bed to chair transfers with assistance (PT). Flowsheets (Taken 05/13/2020 0752) LTG: Pt will perform Bed to Chair Transfers with assistance level: (downgraded due to decreased balance, generalized weakness, decreased motor control/sequencing, and LE ataxia) Contact Guard/Touching assist Note: downgraded due to decreased balance, generalized weakness, decreased motor control/sequencing, and LE ataxia   Problem: RH Ambulation Goal: LTG Patient will ambulate in controlled environment (PT) Description: LTG: Patient will ambulate in a controlled environment, # of feet with assistance (PT). Flowsheets (Taken 05/13/2020 0752) LTG: Pt will ambulate in controlled environ  assist needed:: (downgraded due to decreased balance, generalized weakness, decreased motor control/sequencing, and LE ataxia) Contact Guard/Touching assist LTG: Ambulation distance in controlled environment: 126ft with LRAD Note: downgraded due to decreased balance, generalized weakness, decreased motor control/sequencing, and LE ataxia Goal: LTG Patient will ambulate in home environment (PT) Description: LTG: Patient will ambulate in home environment, # of feet with assistance (PT). Flowsheets (Taken 05/13/2020 0752) LTG: Pt will ambulate in home environ  assist needed:: (downgraded due to decreased balance, generalized weakness, decreased motor control/sequencing, and LE ataxia) Contact Guard/Touching assist LTG: Ambulation distance in home environment: 6ft with LRAD Note: downgraded due to decreased balance, generalized weakness, decreased motor control/sequencing, and LE ataxia

## 2020-05-14 ENCOUNTER — Inpatient Hospital Stay (HOSPITAL_COMMUNITY): Payer: Medicare Other | Admitting: Occupational Therapy

## 2020-05-14 ENCOUNTER — Inpatient Hospital Stay (HOSPITAL_COMMUNITY): Payer: Medicare Other

## 2020-05-14 LAB — CBC WITH DIFFERENTIAL/PLATELET
Abs Immature Granulocytes: 0.06 10*3/uL (ref 0.00–0.07)
Basophils Absolute: 0.1 10*3/uL (ref 0.0–0.1)
Basophils Relative: 1 %
Eosinophils Absolute: 0.4 10*3/uL (ref 0.0–0.5)
Eosinophils Relative: 2 %
HCT: 43.9 % (ref 39.0–52.0)
Hemoglobin: 14.1 g/dL (ref 13.0–17.0)
Immature Granulocytes: 0 %
Lymphocytes Relative: 12 %
Lymphs Abs: 1.9 10*3/uL (ref 0.7–4.0)
MCH: 29.8 pg (ref 26.0–34.0)
MCHC: 32.1 g/dL (ref 30.0–36.0)
MCV: 92.8 fL (ref 80.0–100.0)
Monocytes Absolute: 1.2 10*3/uL — ABNORMAL HIGH (ref 0.1–1.0)
Monocytes Relative: 8 %
Neutro Abs: 11.9 10*3/uL — ABNORMAL HIGH (ref 1.7–7.7)
Neutrophils Relative %: 77 %
Platelets: 373 10*3/uL (ref 150–400)
RBC: 4.73 MIL/uL (ref 4.22–5.81)
RDW: 13 % (ref 11.5–15.5)
WBC: 15.5 10*3/uL — ABNORMAL HIGH (ref 4.0–10.5)
nRBC: 0 % (ref 0.0–0.2)

## 2020-05-14 LAB — COMPREHENSIVE METABOLIC PANEL
ALT: 73 U/L — ABNORMAL HIGH (ref 0–44)
AST: 45 U/L — ABNORMAL HIGH (ref 15–41)
Albumin: 2.6 g/dL — ABNORMAL LOW (ref 3.5–5.0)
Alkaline Phosphatase: 90 U/L (ref 38–126)
Anion gap: 8 (ref 5–15)
BUN: 25 mg/dL — ABNORMAL HIGH (ref 8–23)
CO2: 28 mmol/L (ref 22–32)
Calcium: 8.7 mg/dL — ABNORMAL LOW (ref 8.9–10.3)
Chloride: 103 mmol/L (ref 98–111)
Creatinine, Ser: 0.96 mg/dL (ref 0.61–1.24)
GFR calc Af Amer: >60 mL/min (ref >60)
GFR calc non Af Amer: >60 mL/min (ref >60)
Glucose, Bld: 108 mg/dL — ABNORMAL HIGH (ref 70–99)
Potassium: 4.1 mmol/L (ref 3.5–5.1)
Sodium: 139 mmol/L (ref 135–145)
Total Bilirubin: 1.6 mg/dL — ABNORMAL HIGH (ref 0.3–1.2)
Total Protein: 6.3 g/dL — ABNORMAL LOW (ref 6.5–8.1)

## 2020-05-14 LAB — GLUCOSE, CAPILLARY
Glucose-Capillary: 105 mg/dL — ABNORMAL HIGH (ref 70–99)
Glucose-Capillary: 113 mg/dL — ABNORMAL HIGH (ref 70–99)
Glucose-Capillary: 128 mg/dL — ABNORMAL HIGH (ref 70–99)
Glucose-Capillary: 137 mg/dL — ABNORMAL HIGH (ref 70–99)
Glucose-Capillary: 149 mg/dL — ABNORMAL HIGH (ref 70–99)
Glucose-Capillary: 192 mg/dL — ABNORMAL HIGH (ref 70–99)
Glucose-Capillary: 83 mg/dL (ref 70–99)

## 2020-05-14 NOTE — Progress Notes (Signed)
Physical Therapy Session Note  Patient Details  Name: Johnathan Barker MRN: [id number] Date of Birth: 1941/10/17  Today's Date: 05/14/2020 PT Individual Time: 5732-2025 and 4270-6237 PT Individual Time Calculation (min): 71 min and 42 min  Short Term Goals: Week 2:  PT Short Term Goal 1 (Week 2): Pt will transfer bed<>chair with LRAD min A PT Short Term Goal 1 - Progress (Week 2): Met PT Short Term Goal 2 (Week 2): Pt will ambulate 10f with LRAD mod A PT Short Term Goal 2 - Progress (Week 2): Met PT Short Term Goal 3 (Week 2): Pt will perform simulated car transfer with LRAD min A PT Short Term Goal 3 - Progress (Week 2): Met Week 3:  PT Short Term Goal 1 (Week 3): STG=LTG due to LOS  Skilled Therapeutic Interventions/Progress Updates:   Treatment Session 1: 0915-1026 71 min Received pt supine in bed asleep, pt agreeable to therapy once woken, and denied any pain during session. RN present to disconnect NG tube for therapy. Session with emphasis on functional mobility/transfers, toileting, generalized strengthening, dynamic standing balance/coordinaiton, ambulation, stair navigation, and improved activity tolerance. Doffed bilateral wrist restraints and donned pants in supine with mod A and pt able to roll to L and R to pull pants over hips with supervision and verbal cues. Doffed non-skid socks and donned clean socks total A for time management. Pt transferred supine<>sitting EOB with supervision and donned shoes min A. Pt transferred sit<>stand min A and required total A to don/fasten belt for time management. Pt transferred bed<>WC stand<>pivot min A with cues for hand placement and turning technique. Pt combed hair seated in WC at sink with supervision. Pt performed WC mobility 1565fusing bilateral UEs and supervision to therapy gym. Pt reported urge to have BM and [facility] Pt ambulated 34f32fith RW min A to toilet with bedside commode over top using grab bars  and required max A to doff brief/pants. Pt able to urinate and with medium BM. Pt with difficulty aiming while urinating resulting in soiled shorts/brief. Doffed soiled brief and pants and donned clean ones max A. Pt transferred sit<>stand CGA using grab bars and required total A for peri-care and max A to pull pants/brief over hips. Stand<>pivot toilet<>WC. Pt washed hands at sink [facility]. Pt [facility] therapy [facility] and navigated 8 steps with 1 rail min A using a lateral stepping technique. Pt frequently stepping on top of his feet and required cues to increase BOS prior to proceeding to next step. Pt transported to dayroom total A and ambulated 180f46fth RW min A. Pt continues to demonstrate mild LE ataxia, decreased balance/postural control, and flexed trunk and required cues for RW safety, upright posture, and to increase BOS. Pt required multiple rest breaks throughout session due to increased fatigue and frequently closing eyes during session. Pt reported SOB after ambulation; O2 sat 96% on RA. Pt transported back to room [facility] and transferred WC<>recliner stand<>pivot min A with cues for sequencing. Concluded session with pt sitting in recliner, needs within reach, and seatbelt alarm on. Bilateral wrist restraints donned.   Treatment Session 2: 1445-1527 42 min Received pt sitting in recliner, pt agreeable to therapy, and denied any pain during session. Session with emphasis on functional mobility/transfers, generalized strengthening, dynamic standing balance/coordinaiton, ambulation, stair navigation, and improved activity tolerance. Pt transferred stand pivot with RW min A x3 trials throughout  session (2 with pt's son and 1 with therapist). Pt transported to therapy gym in [facility] total A for energy conservation purposes. Pt navigated 4 steps x 2 trials with 1 rail on L using a lateral stepping technique with min A from pt's son. [name] able to correctly  cue pt for stepping sequence and foot placement on step. Pt ambulated 146f with RW min A with pt's son with therapist providing WC follow. Pt with increased fatigue and required multiple rest breaks throughout session. Pt transferred sit<>stand with RW min A x 2 trials and performed alternating toe taps to 6in step with CGA 2x20 with cues for upright posture and technqiue. [facility] and son provided min A for stand<>pivot transfer onto toilet and was educated on cProbation officerstrategies. Son stepped out to speak with speech therapy and PT assisted with toileting. Pt unable to void or have BM but with small smear. Required total A for peri-care and max A for clothing management. Concluded session with pt supine in bed, needs within reach, and bed alarm on. Family present at bedside.    Therapy Documentation Precautions:  Precautions Precautions: Cervical, Back, Fall Precaution Comments: no brace needed Restrictions Weight Bearing Restrictions: No  Therapy/Group: Individual Therapy AAlfonse AlpersPT, DPT   05/14/2020, 7:22 AM

## 2020-05-14 NOTE — Progress Notes (Signed)
Nutrition Follow-up  RD working remotely.  DOCUMENTATION CODES:   Not applicable  INTERVENTION:   - Recommend considering PEG tube placement for more permanent enteral access  - Please obtain updated weight  Continue tube feeding via Cortrak: - Glucerna 1.5 @ 65 ml/hr (can be held for up to 4 hours for therapies) - Pro-stat 30 ml daily - Free water per MD/PA  Tube feeding regimen provides2050kcal, 122grams of protein, and 943m of H2O.   NUTRITION DIAGNOSIS:   Inadequate oral intake related to dysphagia as evidenced by NPO status.  Ongoing, being addressed via TF  GOAL:   Patient will meet greater than or equal to 90% of their needs  Met via TF  MONITOR:   Diet advancement, Labs, Weight trends, TF tolerance, Skin, I & O's  REASON FOR ASSESSMENT:   Consult Enteral/tube feeding initiation and management  ASSESSMENT:   79year old male with PMH of HTN, T2DM, BLE weakness with difficulty walking over past couple of months with frequent falls. Admitted on 04/24/20 with lower extremity weakness and feeling cold. 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  Also found to have right thyroid nodule with recommendations for thyroid ultrasound. On 04/26/20 pt underwent ACDF of C3/C4 and decompressive thoracic laminectomy/medial facetectomy. Admitted to CIR on 04/27/20.  6/05 - concern for aspiration, pt made NPO, NGT placed 6/07 - pt removed NGT overnight, Cortrak placed (tip gastric per Cortrak team) x 2 6/08 - MBSS with recommendations for NPO 6/16 - MBSS with recommendations for NPO  Noted target discharge date of 05/18/20.  Noted plans for family meeting with Dr. PPosey Prontoand SLP to discuss feeding options.  No new weights since admission. Please obtain updated weight.  Given pt's  continued NPO status after multiple MBSS, recommend considering PEG tube placement if within pt's GOC.  Medications reviewed and include: pepcid, SSI q 4 hours, Novolog 5 units q 4 hours, Lantus 14 units daily, protonix  Labs reviewed: elevated LFTs CBG's: 83-192 x 24 hours  Diet Order:   Diet Order            Diet NPO time specified  Diet effective now                 EDUCATION NEEDS:   Education needs have been addressed  Skin:  Skin Assessment: Skin Integrity Issues: Incisions: neck, back  Last BM:  05/14/20 large type 7  Height:   Ht Readings from Last 1 Encounters:  04/27/20 6' (1.829 m)    Weight:   Wt Readings from Last 1 Encounters:  04/27/20 92.5 kg    BMI:  Body mass index is 27.66 kg/m.  Estimated Nutritional Needs:   Kcal:  2100-2300  Protein:  110-125 grams  Fluid:  >/= 2.0 L    KGaynell Face MS, RD, LDN Inpatient Clinical Dietitian Pager: [phone]/After Hours: [phone]

## 2020-05-14 NOTE — Progress Notes (Signed)
[facility] PHYSICAL MEDICINE & REHABILITATION PROGRESS NOTE  Subjective/Complaints: Patient seen sitting up in bed this morning.  He states he slept well overnight.  No reported issues overnight.  No reported issues the weekend.  He appears more calm this morning.  ROS: Limited due to cognition  Objective: Vital Signs: Blood pressure (!) 128/59, pulse 84, temperature 98.5 F (36.9 C), resp. rate 19, height 6' (1.829 m), weight 92.5 kg, SpO2 100 %. No results found. Recent Labs    05/14/20 0622  WBC 15.5*  HGB 14.1  HCT 43.9  PLT 373   Recent Labs    05/11/20 1113 05/14/20 0621  NA 137 139  K 4.7 4.1  CL 99 103  CO2 29 28  GLUCOSE 295* 108*  BUN 30* 25*  CREATININE 0.97 0.96  CALCIUM 8.4* 8.7*    Physical Exam: BP (!) 128/59   Pulse 84   Temp 98.5 F (36.9 C)   Resp 19   Ht 6' (1.829 m)   Wt 92.5 kg   SpO2 100%   BMI 27.66 kg/m   Constitutional: No distress . Vital signs reviewed. HENT: Normocephalic.  Atraumatic.  + NG. Eyes: EOMI. No discharge. Cardiovascular: No JVD. Respiratory: Normal effort.  No stridor. GI: Non-distended. Skin: Incision with edema C/D/I Psych: Confused.   Musc: No edema in extremities.  No tenderness in extremities. Neurological: Alert and oriented x3 Motor: 4+-5/5 throughout, stable  Assessment/Plan: 1. 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  Physiatrist is providing close team supervision and 24 hour management of active medical problems listed below.  Physiatrist and rehab team continue to assess barriers to discharge/monitor patient progress toward functional and medical goals  Care Tool:  Bathing    Body parts bathed by patient: Right arm, Left arm, Chest, Abdomen, Right upper leg, Left upper leg, Face   Body parts bathed by helper: Right lower leg, Left lower leg Body parts n/a: Front  perineal area, Buttocks   Bathing assist Assist Level: Minimal Assistance - Patient > 75%     Upper Body Dressing/Undressing Upper body dressing   What is the patient wearing?: Pull over shirt    Upper body assist Assist Level: Supervision/Verbal cueing    Lower Body Dressing/Undressing Lower body dressing      What is the patient wearing?: Pants     Lower body assist Assist for lower body dressing: Minimal Assistance - Patient > 75%     Toileting Toileting Toileting Activity did not occur Landscape architect and hygiene only): Refused  Toileting assist Assist for toileting: Contact Guard/Touching assist     Transfers Chair/bed transfer  Transfers assist     Chair/bed transfer assist level: Minimal Assistance - Patient > 75% Chair/bed transfer assistive device: Programmer, multimedia   Ambulation assist      Assist level: Minimal Assistance - Patient > 75% Assistive device: Walker-rolling Max distance: 149ft   Walk 10 feet activity   Assist  Walk 10 feet activity did not occur: Safety/medical concerns  Assist level: Minimal Assistance - Patient > 75% Assistive device: Walker-rolling   Walk 50 feet activity   Assist Walk 50 feet with 2 turns activity did not occur: Safety/medical concerns  Assist level: Minimal Assistance - Patient > 75% Assistive device: Walker-rolling    Walk 150 feet activity   Assist Walk 150 feet activity did not occur: Safety/medical concerns  Assist level:  Minimal Assistance - Patient > 75% Assistive device: Walker-rolling    Walk 10 feet on uneven surface  activity   Assist Walk 10 feet on uneven surfaces activity did not occur: Safety/medical concerns         Wheelchair     Assist Will patient use wheelchair at discharge?:  (TBD) Type of Wheelchair: Manual Wheelchair activity did not occur: Safety/medical concerns  Wheelchair assist level: Supervision/Verbal cueing Max wheelchair  distance: 180ft    Wheelchair 50 feet with 2 turns activity    Assist    Wheelchair 50 feet with 2 turns activity did not occur: Safety/medical concerns   Assist Level: Supervision/Verbal cueing   Wheelchair 150 feet activity     Assist Wheelchair 150 feet activity did not occur: Safety/medical concerns   Assist Level: Supervision/Verbal cueing      Medical Problem List and Plan: 1.  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  Continue CIR  IV Steroids started on 6/5, transition to p.o. on 6/10, decreased on 6/11, decreased further on 6/13, decreased again on 6/16, DC'd on 6/19  MRI brain personally reviewed, unremarkable for acute process  PT notes reviewed-wheelchair mobility 100 feet with supervision ambulating up to 49 feet with rolling walker on uneven surfaces.  Discussed swallowing and plans with SLP. 2.  Antithrombotics: -DVT/anticoagulation:  Mechanical: Sequential compression devices, below knee Bilateral lower extremities             -antiplatelet therapy: N/A 3. Pain Management: Tylenol prn. Does not like Oxycodone.  Ordered ultram prn  Controlled 6/21  Monitor with increased exertion as well as cognition side effects 4. Mood: LCSW to follow for evaluation and support.   Continue telesitter             -antipsychotic agents: N/A 5. Neuropsych: This patient is not fully capable of making decisions on his own behalf.  Discussed with neuropsychology-potential contributors of cognitive deficits, discussed imaging results 6. Skin/Wound Care: Monitor wound for healing.  7. Fluids/Electrolytes/Nutrition: Encourage fluid intake. Monitor I/O.  8. HTN: Monitor BP -continue amlodipine, Irbesartan, and metoprolol.   Controlled 6/21  Monitor with increased mobility 9.  Steroid-induced hyperglycemia on T2DM with hyperglycemia: Hgb A1c- 7.5. Monitor BS ac/hs. Used amaryl 4  mg bid and lantus 2 units HS.   Amaryl on hold at present   Lantus 10, increased to 14 on 6/13  Novolog 5 with meals and at bedtime  Labile on 6/21 10. Leucocytosis: Likely secondary to steroids + PNA.               WBC 15.5 on 6/21  UA/urine no growth  Repeat chest x-ray personally reviewed, showing new right middle lobe abnormality.  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  Remains afebrile  CT abdomen/pelvis results reviewed with wife- shows cholelithiasis, renal calculi, aortic atherosclerosis.   Continue to monitor 11. Constipation:              Adjust bowel meds as necessary 12. Hyperbilirubinemia:   Elevated on 6/5, continue to monitor 13. Pharyngeal dysphagia: ST to follow for evaluation.   NPO with NG at present after repeat MBS, water protocol  Continue tube feeds  DCed IVF  Advance diet as tolerated 14.  Urinary retention             Improving 15.  Sepsis/aspiration pneumonia, associated encephalopathy.   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  Sleep disturbance  Trazodone DC'd, melatonin started on 6/18 18.  Transaminitis  LFTs elevated on 631  Continue to monitor   LOS: 17 days A FACE TO FACE EVALUATION WAS PERFORMED  Johnathan Barker Lorie Phenix 05/14/2020, 11:12 AM

## 2020-05-14 NOTE — Progress Notes (Signed)
Occupational Therapy Session Note  Patient Details  Name: Johnathan Barker MRN: [id number] Date of Birth: 04/10/1941  Today's Date: 05/14/2020 OT Individual Time: 9357-0177 OT Individual Time Calculation (min): 42 min   Short Term Goals: Week 3:  OT Short Term Goal 1 (Week 3): STGs = LTGs  Skilled Therapeutic Interventions/Progress Updates:    Pt greeted in bed with no c/o pain. Spouse Butch Penny present, tx focus placed on starting hands on family training. Butch Penny reports family is purchasing a hospital bed due to high elevation of their bed at home. Pt completed supine<sit with use of bedrails and close supervision assist. He was able to don sneakers and tie laces with CGA for balance assistance and assistance to obtain figure 4 position with the Rt LE. We celebrated!! First demonstrated technique for stand pivot transfer to the [facility] using RW with CGA, then had Butch Penny practice this transfer with pt several times. With repetition, Butch Penny visibly more comfortable with providing guarding assistance and safety cues. Discussed placing BSC in the bedroom at night. Pt then attempted to use the urinal while sitting EOB with Butch Penny providing setup. Pt with urine spillage and required assistance for hygiene and dressing tasks afterwards. Provided pt with a male urinal and educated Butch Penny on use. Butch Penny had hands on practice assisting pt with stated tasks, required vcs to not over-help pt to promote remediation of functional skills. At end of session pt completed stand pivot<recliner using RW with Butch Penny providing guarding assist. Pt remained in the recliner with all needs within reach and Butch Penny present. Educated Butch Penny that she cannot assist pt with transfers without therapy present. She verbalized understanding.   Therapy Documentation Precautions:  Precautions Precautions: Cervical, Back, Fall Precaution Comments: no brace needed Restrictions Weight Bearing Restrictions: No Vital Signs: Therapy Vitals Pulse  Rate: 73 Resp: 18 BP: 126/67 Patient Position (if appropriate): Lying Oxygen Therapy SpO2: 99 % O2 Device: Room Air ADL: ADL Eating: NPO Grooming: Supervision/safety Where Assessed-Grooming: Edge of bed Upper Body Bathing: Minimal assistance Where Assessed-Upper Body Bathing: Edge of bed Lower Body Bathing: Moderate assistance Where Assessed-Lower Body Bathing: Edge of bed Upper Body Dressing: Minimal assistance Where Assessed-Upper Body Dressing: Edge of bed Lower Body Dressing: Maximal assistance Where Assessed-Lower Body Dressing: Edge of bed Toileting: Dependent (using Stedy) Where Assessed-Toileting: Bedside Commode Toilet Transfer: Dependent Toilet Transfer Method: Other (comment) Charlaine Dalton) Toilet Transfer Equipment: Radiographer, therapeutic: Not assessed      Therapy/Group: Individual Therapy  Eriyanna Kofoed A [place]eppe 05/14/2020, 3:50 PM

## 2020-05-14 NOTE — Progress Notes (Signed)
Occupational Therapy Session Note  Patient Details  Name: Johnathan Barker MRN: [id number] Date of Birth: 1941-04-24  Today's Date: 05/14/2020 OT Individual Time: 1031-1101 OT Individual Time Calculation (min): 30 min  and Today's Date: 05/14/2020 OT Missed Time: 15 Minutes Missed Time Reason: Patient fatigue   Short Term Goals: Week 3:  OT Short Term Goal 1 (Week 3): STGs = LTGs  Skilled Therapeutic Interventions/Progress Updates:    Treatment session with focus on donning footwear and functional mobility. Pt received in recliner asleep.  Pt easily awakened but stating "get to business" and "being honest" when encouraging pt to engage in self-care or functional mobility tasks.  Pt with some increased confusion upon waking but ultimately agreeable to focusing on footwear.  Noted pt shoes to be too large and falling off.  Located smaller sized shoes in room. Pt then able to doff and don socks and new shoes, including tying shoe laces.  Ambulated 20' with RW with min assist and cues for safety due to impulsivity and decreased awareness of BLE.  Pt requesting to return to bed due to fatigue.  Pt with c/o being "too busy" with not enough time to rest.  Transferred back to bed min assist stand pivot with RW with cues for safety.  Pt returned to semi-reclined and left with all needs in reach and therapist reapplied wrist restraints.  Pt missed remaining 15 mins due to fatigue.  Therapy Documentation Precautions:  Precautions Precautions: Cervical, Back, Fall Precaution Comments: no brace needed Restrictions Weight Bearing Restrictions: No General: General OT Amount of Missed Time: 15 Minutes Pain:  Pt with no c/o pain  Therapy/Group: Individual Therapy  [name] 05/14/2020, 1:25 PM

## 2020-05-15 ENCOUNTER — Inpatient Hospital Stay (HOSPITAL_COMMUNITY): Payer: Medicare Other

## 2020-05-15 ENCOUNTER — Inpatient Hospital Stay (HOSPITAL_COMMUNITY): Payer: Medicare Other | Admitting: Occupational Therapy

## 2020-05-15 ENCOUNTER — Encounter (HOSPITAL_COMMUNITY): Payer: Medicare Other | Admitting: Speech Pathology

## 2020-05-15 LAB — GLUCOSE, CAPILLARY
Glucose-Capillary: 89 mg/dL (ref 70–99)
Glucose-Capillary: 128 mg/dL — ABNORMAL HIGH (ref 70–99)
Glucose-Capillary: 257 mg/dL — ABNORMAL HIGH (ref 70–99)
Glucose-Capillary: 203 mg/dL — ABNORMAL HIGH (ref 70–99)
Glucose-Capillary: 146 mg/dL — ABNORMAL HIGH (ref 70–99)

## 2020-05-15 MED ORDER — ENSURE MAX PROTEIN PO LIQD
11.0000 [oz_av] | Freq: Three times a day (TID) | ORAL | Status: DC
  Administered 2020-05-15 – 2020-05-16 (×2): 11 [oz_av] via ORAL
  Filled 2020-05-15 (×4): qty 330

## 2020-05-15 NOTE — Progress Notes (Signed)
[place] PHYSICAL MEDICINE & REHABILITATION PROGRESS NOTE  Subjective/Complaints: Patient seen laying in bed this AM.  He states he slept well overnight.  No reported issues.  He appears more aware.  Discussed plans for MBS today with patient as well as therapies.   ROS: Limited due to cognition, appears to deny CP, SOB, N/V/D.  Objective: Vital Signs: Blood pressure 125/63, pulse 78, temperature 98.6 F (37 C), resp. rate 18, height 6' (1.829 m), weight 92.5 kg, SpO2 97 %. No results found. Recent Labs    05/14/20 0622  WBC 15.5*  HGB 14.1  HCT 43.9  PLT 373   Recent Labs    05/14/20 0621  NA 139  K 4.1  CL 103  CO2 28  GLUCOSE 108*  BUN 25*  CREATININE 0.96  CALCIUM 8.7*    Physical Exam: BP 125/63   Pulse 78   Temp 98.6 F (37 C)   Resp 18   Ht 6' (1.829 m)   Wt 92.5 kg   SpO2 97%   BMI 27.66 kg/m   Constitutional: No distress . Vital signs reviewed. HENT: Normocephalic.  Atraumatic. +NG. Eyes: EOMI. No discharge. Cardiovascular: No JVD. Respiratory: Normal effort.  No stridor. GI: Non-distended. Skin: Incision with edema C/D/I Psych: Confused, ?improving Musc: No edema in extremities.  No tenderness in extremities. Neurological: Alert and oriented x4 Motor: 4+-5/5 throughout, unchanged  Assessment/Plan: 1. 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  Physiatrist is providing close team supervision and 24 hour management of active medical problems listed below.  Physiatrist and rehab team continue to assess barriers to discharge/monitor patient progress toward functional and medical goals  Care Tool:  Bathing    Body parts bathed by patient: Right arm, Left arm, Chest, Abdomen, Right upper leg, Left upper leg, Face   Body parts bathed by helper: Right lower leg, Left lower leg Body parts n/a: Front perineal area,  Buttocks   Bathing assist Assist Level: Minimal Assistance - Patient > 75%     Upper Body Dressing/Undressing Upper body dressing   What is the patient wearing?: Pull over shirt    Upper body assist Assist Level: Supervision/Verbal cueing    Lower Body Dressing/Undressing Lower body dressing      What is the patient wearing?: Pants     Lower body assist Assist for lower body dressing: Minimal Assistance - Patient > 75%     Toileting Toileting Toileting Activity did not occur Landscape architect and hygiene only): Refused  Toileting assist Assist for toileting: Contact Guard/Touching assist     Transfers Chair/bed transfer  Transfers assist     Chair/bed transfer assist level: Minimal Assistance - Patient > 75% Chair/bed transfer assistive device: Programmer, multimedia   Ambulation assist      Assist level: Minimal Assistance - Patient > 75% Assistive device: Walker-rolling Max distance: 140ft   Walk 10 feet activity   Assist  Walk 10 feet activity did not occur: Safety/medical concerns  Assist level: Minimal Assistance - Patient > 75% Assistive device: Walker-rolling   Walk 50 feet activity   Assist Walk 50 feet with 2 turns activity did not occur: Safety/medical concerns  Assist level: Minimal Assistance - Patient > 75% Assistive device: Walker-rolling    Walk 150 feet activity   Assist Walk 150 feet activity did not occur: Safety/medical concerns  Assist level: Minimal Assistance - Patient > 75%  Assistive device: Walker-rolling    Walk 10 feet on uneven surface  activity   Assist Walk 10 feet on uneven surfaces activity did not occur: Safety/medical concerns         Wheelchair     Assist Will patient use wheelchair at discharge?:  (TBD) Type of Wheelchair: Manual Wheelchair activity did not occur: Safety/medical concerns  Wheelchair assist level: Supervision/Verbal cueing Max wheelchair distance: 157ft     Wheelchair 50 feet with 2 turns activity    Assist    Wheelchair 50 feet with 2 turns activity did not occur: Safety/medical concerns   Assist Level: Supervision/Verbal cueing   Wheelchair 150 feet activity     Assist Wheelchair 150 feet activity did not occur: Safety/medical concerns   Assist Level: Supervision/Verbal cueing      Medical Problem List and Plan: 1.  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  Continue CIR  IV Steroids started on 6/5, transition to p.o. on 6/10, decreased on 6/11, decreased further on 6/13, decreased again on 6/16, DC'd on 6/19  MRI brain personally reviewed, unremarkable for acute process 2.  Antithrombotics: -DVT/anticoagulation:  Mechanical: Sequential compression devices, below knee Bilateral lower extremities             -antiplatelet therapy: N/A 3. Pain Management: Tylenol prn. Does not like Oxycodone.  Ordered ultram prn  Controlled 6/22  Monitor with increased exertion as well as cognition side effects 4. Mood: LCSW to follow for evaluation and support.   Continue telesitter             -antipsychotic agents: N/A 5. Neuropsych: This patient is not fully capable of making decisions on his own behalf.  Discussed with neuropsychology-potential contributors of cognitive deficits, discussed imaging results 6. Skin/Wound Care: Monitor wound for healing.  7. Fluids/Electrolytes/Nutrition: Encourage fluid intake. Monitor I/O.  8. HTN: Monitor BP -continue amlodipine, Irbesartan, and metoprolol.   Controlled 6/22  Monitor with increased mobility 9.  Steroid-induced hyperglycemia on T2DM with hyperglycemia: Hgb A1c- 7.5. Monitor BS ac/hs. Used amaryl 4 mg bid and lantus 2 units HS.   Amaryl on hold at present   Lantus 10, increased to 14 on 6/13  Novolog 5 with meals and at bedtime  Relatively controlled on 6/22 10. Leucocytosis:  Likely secondary to steroids + PNA.              WBC 15.5 on 6/21, labs ordered for tomorrow  UA/urine no growth  Repeat chest x-ray personally reviewed, showing new right middle lobe abnormality.  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  Remains afebrile  CT abdomen/pelvis results reviewed with wife- shows cholelithiasis, renal calculi, aortic atherosclerosis.   Continue to monitor 11. Constipation:              Adjust bowel meds as necessary 12. Hyperbilirubinemia:   Elevated on 6/5, continue to monitor 13. Pharyngeal dysphagia: ST to follow for evaluation.   NPO with NG at present after repeat MBS, water protocol  Continue tube feeds  DCed IVF  Plan for MBS today, if unable to advance diet, will need to discuss PEG with family 75.  Urinary retention             Improving 15.  Sepsis/aspiration pneumonia, associated encephalopathy.   Bcx + x 2 for staph hominis, discussed with pharmacy, completed IV cefepime on 6/15  IV Flagyl DC'd after discussion with pharmacy  Repeat blood cultures no growth  Echo  reviewed, no signs of vegetation, EF 55-60%  Appreciate pulmonary recs-hold abx  CT chest showing ILD versus interstitial pneumonia, follow-up in 12 months  CT abdomen personally reviewed, renal stones  Procalcitonin within normal limits on 6/17  See #10 16.  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  Sleep disturbance  Trazodone DC'd, melatonin started on 6/18 18.  Transaminitis  LFTs elevated on 631  Continue to monitor   LOS: 18 days A FACE TO FACE EVALUATION WAS PERFORMED  Shaylee Stanislawski Lorie Phenix 05/15/2020, 9:29 AM

## 2020-05-15 NOTE — Progress Notes (Signed)
Modified Barium Swallow Progress Note  Patient Details  Name: Johnathan Barker MRN: [id number] Date of Birth: [birth year]  Today's Date: 05/15/2020  Modified Barium Swallow completed.  Full report located under Chart Review in the Imaging Section.  Brief recommendations include the following:  Clinical Impression   Pt's pharyngeal dysphagia is moderately improved since last MBSS conducted 05/09/20. Suspect pharyngeal s/p surgical edema and reduced pharyngeal space from Cortrak continues negatively impact epiglottic inversion, resulting in incomplete laryngeal vestibule closure and decreased pharyngeal wall contraction. As a result, mod-severe vallecular residue is present, primarily with puree and soft solids. During trial of puree, PO also began to coat the underside of pt's epiglottis and trace penetration noted during subsequent swallow, in addition to the residue that could not be effectively cleared with additional dry swallows. Very minimal vallecular residue noted with thin barium trials (improved since last study). Pt's swallow initiation was timely, and although thin barium via cup and straw intermittently penetrated into the laryngeal vestibule during the swallow, no aspiration events were observed and penetration stayed above the level of the vocal folds in >90% of trials. Due to cognitive impairments, pt unable to follow directions to consume pill whole with thin (he masticated pill), however he deglutated partially masticated pill with very large sip of thin barium without airway intrusion. Of note, pt exhibited throat clearing and reports a discomfort during intake, particularly with puree and soft solids, which suspect is due to edema. Would recommend that pt initiate a thin liquid diet (may have ensures/boosts) with intermittent throat clear and extra dry swallow as compensatory strategies. Medications may be administered whole with thin and ST will continue to provide skilled interventions  to work toward diet advancement. Pt would benefit from repeat MBSS prior to solid texture advancement.    Swallow Evaluation Recommendations       SLP Diet Recommendations: Thin liquid;No solids, see liquids (clear liquid diet) - pt may also have other thin liquids that are not clear, including boost/ensure   Liquid Administration via: Cup;Straw   Medication Administration: Via alternative means   Supervision: Patient able to self feed;Full supervision/cueing for compensatory strategies   Compensations: Clear throat after each swallow;Multiple dry swallows after each bite/sip (pt may use strategies intermittently)   Postural Changes: Remain semi-upright after after feeds/meals (Comment)   Oral Care Recommendations: Oral care BID        [name] 05/15/2020,12:47 PM

## 2020-05-15 NOTE — Progress Notes (Signed)
Physical Therapy Session Note  Patient Details  Name: Johnathan Barker MRN: [id number] Date of Birth: 17-Nov-1941  Today's Date: 05/15/2020 PT Individual Time: [phone] and [phone]  PT Individual Time Calculation (min): 59 min and 24 min  Short Term Goals: Week 2:  PT Short Term Goal 1 (Week 2): Pt will transfer bed<>chair with LRAD min A PT Short Term Goal 1 - Progress (Week 2): Met PT Short Term Goal 2 (Week 2): Pt will ambulate 28f with LRAD mod A PT Short Term Goal 2 - Progress (Week 2): Met PT Short Term Goal 3 (Week 2): Pt will perform simulated car transfer with LRAD min A PT Short Term Goal 3 - Progress (Week 2): Met Week 3:  PT Short Term Goal 1 (Week 3): STG=LTG due to LOS  Skilled Therapeutic Interventions/Progress Updates:   Treatment Session 1: [id number] min Received pt supine in bed, pt agreeable to therapy, and denied any pain during session but reported fatigue. Session with emphasis on dressing, functional mobility/transfers, generalized strengthening, dynamic standing balance/coordination, ambulation, and improved activity tolerance. RN present to disconnect NG tube and bilateral wrist restraints doffed total A. Pt donned pants in supine with mod A and able to roll to L and R with supervision to pull pants over hips. Pt able to fasten belt and zip pants with supervision and transferred supine<>sitting EOB from flat bed with supervision. Donned socks max A for time management and shoes with supervision. Doffed dirty shirt and donned clean one min A. Pt transferred bed<>WC stand<>pivot CGA with minimal cues for turning technique and hand placement. Pt combed hair sitting in WC at sink with supervision and performed oral care with cues for spitting, swallowing, and clearing throat. Pt performed WC mobility 1525fto therapy gym using bilateral UEs and supervision. Pt transferred stand<>pivot without AD x 3 additional trials throughout session with CGA/min A. Pt ambulated 1987fwith RW min A. Pt demonstrated flexed trunk, LE ataxia, and decreased stride length and required verbal cues for upright posture and to increase stride length. Pt performed the following exercises standing with UE support on RW CGA with verbal cues for technique: -mini squats 2x8 -alternating marching 2x10 (pt with LE ataxia R>L) Pt required multiple rest breaks throughout session due to increased fatigue and then reported urge to urinate. Pt transported back to room in WC [facility] A and transferred stand<>pivot WC<>toilet with bedside commode over top CGA and required max A to doff pants/brief. Pt able to void and transferred sit<>stand CGA and required total A for peri-care and to doff soiled and don clean brief. Stand<>pivot toilet<>WC and WC<>bed CGA and sit<>supine with supervision. Concluded session with pt supine in bed, needs within reach, and bed alarm on. Bilateral wrist restraints donned.     Treatment Session 2: [id number] min Received pt supine in bed asleep, upon wakening pt agreeable to therapy, and denied any pain during session. Session with emphasis on functional mobility/transfers, generalized strengthening, dynamic standing balance/coordination, ambulation, and improved activity tolerance. Pt transferred supine<>sit with supervision with HOB elevated and ambulated [id number] RW min A. Pt with increased fatigue this afternoon and requested to sit and rest. Pt transferred stand<>pivot WC<>Nustep without AD min A x 2 trials and performed bilateral UE and LE strengthening on Nustep at workload 3 for 5 minutes for a total of 314 steps. Pt frequently stopping throughout activity stating "thats it, I'm tired" and frequently closing his eyes. [facility]  and stand<>pivot WC<>bed CGA and sit<>supine with supervision. Concluded session with pt supine in bed, needs within reach, and bed alarm on. Wife present at bedside.   Therapy Documentation Precautions:   Precautions Precautions: Cervical, Back, Fall Precaution Comments: no brace needed Restrictions Weight Bearing Restrictions: No   Therapy/Group: Individual Therapy [name] PT, DPT   05/15/2020, 7:19 AM

## 2020-05-15 NOTE — Progress Notes (Signed)
Occupational Therapy Session Note  Patient Details  Name: Johnathan Barker MRN: [id number] Date of Birth: Feb 14, 1941  Today's Date: 05/15/2020 OT Individual Time: 1120-1202 OT Individual Time Calculation (min): 42 min    Short Term Goals: Week 3:  OT Short Term Goal 1 (Week 3): STGs = LTGs  Skilled Therapeutic Interventions/Progress Updates:    Treatment session with focus on self-care retraining with bathing, dressing, and functional transfers.  Pt received in bed agreeable to therapy session.  Engaged in discussion with pt and family regarding shower setup and routine.  Pt expressing desire to shower.  Ambulated to room shower with RW with min assist due to BLE ataxia.  Therapist coved IVs and dressing on back prior to shower.  Pt completed bathing at sit > stand level with min cues for sequencing and safety as pt impulsively standing during one instance.  Pt demonstrated increased sequencing during bathing at shower level due to increased familiar setting.  CGA for standing balance while pt washed buttocks.  Completed UB dressing in shower with supervision for under shirt and then increased assist for top shirt.  Donned pants seated at EOB with increased time due to increased fatigue.  Pt with standing with CGA and pulling pants over hips.  Pt deferred donning socks at this time.  Returned to semi-reclined in bed with all needs in reach.    Family present during therapy session, provided education on current level and recommendations to increase safety as well as allow for increased time during functional tasks.  Therapy Documentation Precautions:  Precautions Precautions: Cervical, Back, Fall Precaution Comments: no brace needed Restrictions Weight Bearing Restrictions: No General:   Vital Signs: Therapy Vitals Pulse Rate: 97 BP: 125/78 Pain: Pain Assessment Pain Scale: 0-10 Faces Pain Scale: No hurt   Therapy/Group: Individual Therapy  [name] 05/15/2020, 12:22 PM

## 2020-05-15 NOTE — Progress Notes (Signed)
Occupational Therapy Session Note  Patient Details  Name: Johnathan Barker MRN: [id number] Date of Birth: 09/06/1941  Today's Date: 05/15/2020 OT Individual Time: 1420-1450 OT Individual Time Calculation (min): 30 min    Short Term Goals: Week 1:  OT Short Term Goal 1 (Week 1): Pt will complete toilet or BSC transfer with 1 assist without Stedy OT Short Term Goal 1 - Progress (Week 1): Met OT Short Term Goal 2 (Week 1): Pt will complete LB dressing sit<stand with Min balance assist OT Short Term Goal 2 - Progress (Week 1): Partly met OT Short Term Goal 3 (Week 1): Pt will complete one grooming task while standing or in supported standing position near the sink to improve balance OT Short Term Goal 3 - Progress (Week 1): Met  Skilled Therapeutic Interventions/Progress Updates:    Pt supine in bed, having just finished trialing liquids with nursing.  Pt agreeable to participating with OT with no complaints of pain.  Pts wife present throughout session.    Instructed pt through functional transfer training to increase safety and independence during self care tasks.  Pt completed supine to sit with supervision.  Pt completed sit to stand using RW with attempts to pull up using BUE.  Pt educated on safe hand placement during functional transfers using visual demonstration.  Pt return demonstrated sit to stand and short distance ambulation completed to bathroom with CGA using RW.  Pt needing min assist and Max VCs to safely manage RW and refrain from premature sitting at 3 in 1 commode.  Educated pt on compensatory technique of ensuring BLE touching surface of commode prior to sitting and keeping RW at safe distance in front of pt until stand to sit transfer complete.  Pt return demonstrated toilet transfer x 2 trials with CGA. Discussed pts bathroom layout at home, and pts wife reporting narrow space and concerned pt may need to side step to maneuver RW.  Notified pts attending OT of pts home bathroom  layout for further training. Pt ambulated back to EOB and completed stand to sit with mod VCs for safe technique.  Pt doffed/donned socks and donned shoes with setup and supervision.  Pt self initiating return to supine stating he feels tired.  Bed alarm on and call bell in reach.    Therapy Documentation Precautions:  Precautions Precautions: Cervical, Back, Fall Precaution Comments: no brace needed Restrictions Weight Bearing Restrictions: No   Therapy/Group: Individual Therapy  Johnathan Barker 05/15/2020, 4:02 PM

## 2020-05-15 NOTE — Progress Notes (Signed)
Physical Therapy Discharge Summary  Patient Details  Name: Johnathan Barker MRN: [id number] Date of Birth: 27-Jul-1941  Today's Date: 05/17/2020 PT Individual Time: 1100-1200 PT Individual Time Calculation (min): 60 min   Patient has met 8 of 8 long term goals due to improved activity tolerance, improved balance, improved postural control and increased strength.  Patient to discharge at an ambulatory level CGA.  Patient's family is independent to provide the necessary physical and cognitive assistance at discharge. [name] and son, Tayquan, attended family education training on 6/16. Since then, pt's son has been involved with hands on practice with transfers, ambulation, and stairs during multiple sessions and has demonstrated and verbalized confidence with all tasks to ensure safe discharge home.   All goals met  Recommendation:  Patient will benefit from ongoing skilled PT services in home health setting to continue to advance safe functional mobility, address ongoing impairments in functional mobility/transfers, generalized strengthening, dynamic standing balance/coordination, ambulation, stair navigation, endurance, NMR, motor control/planning, and to minimize fall risk.  Equipment: No equipment provided  Reasons for discharge: treatment goals met  Patient/family agrees with progress made and goals achieved: Yes  PT Discharge Precautions/Restrictions Precautions Precautions: Fall Restrictions Weight Bearing Restrictions: No Cognition Overall Cognitive Status: Impaired/Different from baseline Arousal/Alertness: Awake/alert Orientation Level: Oriented X4 Memory: Impaired Awareness: Impaired Problem Solving: Impaired Behaviors: Impulsive Safety/Judgment: Impaired Comments: mild impulsivity Sensation Sensation Light Touch: Impaired by gross assessment Proprioception: Impaired by gross assessment Additional Comments: decreased sensation along R great toe and L posteior  calf. Pt states he can't always feel his legs. Coordination Gross Motor Movements are Fluid and Coordinated: No Fine Motor Movements are Fluid and Coordinated: Yes Coordination and Movement Description: grossly uncoordinated due to LE ataxia, generalized weakness, decreased balance/postural control, and poor motor control/sequencing Finger Nose Finger Test: [facility] Heel Shin Test: [facility] Motor  Motor Motor: Abnormal postural alignment and control;Ataxia Motor - Skilled Clinical Observations: grossly uncoordinated due to LE ataxia, decreased motor control/planning, LE weakness, decreased balance/postural control, and fatigue  Mobility Bed Mobility Bed Mobility: Rolling Right;Rolling Left;Sit to Supine;Supine to Sit Rolling Right: Supervision/verbal cueing Rolling Left: Supervision/Verbal cueing Supine to Sit: Supervision/Verbal cueing Sit to Supine: Supervision/Verbal cueing Transfers Transfers: Sit to Stand;Stand to Sit;Stand Pivot Transfers Sit to Stand: Contact Guard/Touching assist Stand to Sit: Contact Guard/Touching assist Stand Pivot Transfers: Contact Guard/Touching assist Stand Pivot Transfer Details: Verbal cues for technique;Verbal cues for precautions/safety Transfer (Assistive device): Rolling walker Locomotion  Gait Ambulation: Yes Gait Assistance: Contact Guard/Touching assist Gait Distance (Feet): 180 Feet Assistive device: Rolling walker Gait Gait: Yes Gait Pattern: Impaired Gait Pattern: Ataxic;Trunk flexed;Poor foot clearance - right;Poor foot clearance - left;Narrow base of support;Decreased trunk rotation;Decreased stride length;Decreased step length - left;Decreased step length - right Gait velocity: decreased Stairs / Additional Locomotion Stairs: Yes Stairs Assistance: Minimal Assistance - Patient > 75% Stair Management Technique: One rail Left Number of Stairs: 8 Height of Stairs: 6 Ramp: Minimal Assistance - Patient >75% (RW) Designer, industrial/product Mobility: Yes Wheelchair Assistance: Chartered loss adjuster: Both upper extremities Wheelchair Parts Management: Needs assistance Distance: 139f  Trunk/Postural Assessment  Cervical Assessment Cervical Assessment: Ex[facility] to [facility]) Thoracic Assessment Thoracic Assessment: Ex[facility] to [facility](rounded shoulders) Lumbar Assessment Lumbar Assessment: Exceptions to [facility](posterior pelvic tilt) Postural Control Postural Control: Deficits on evaluation  Balance Balance Balance Assessed: Yes Static Sitting Balance Static Sitting - Balance Support: Feet supported;No upper extremity supported Static Sitting - Level of Assistance: 7: Independent Dynamic Sitting Balance  Dynamic Sitting - Balance Support: Feet supported;No upper extremity supported Dynamic Sitting - Level of Assistance: 7: Independent Static Standing Balance Static Standing - Balance Support: Bilateral upper extremity supported (RW) Static Standing - Level of Assistance: 5: Stand by assistance (CGA) Dynamic Standing Balance Dynamic Standing - Balance Support: Bilateral upper extremity supported (RW) Dynamic Standing - Level of Assistance: 5: Stand by assistance (CGA) Extremity Assessment  RLE Assessment RLE Assessment: Exceptions to [facility] General Strength Comments: grossly generalized to 4-/5 (except knee flexion and PF 3+/5) LLE Assessment LLE Assessment: Exceptions to [facility] General Strength Comments: grossly generalized to 4-/5 (except knee flexion and PF 3+/5)   [name] PT, DPT  05/15/2020, 3:58 PM

## 2020-05-16 ENCOUNTER — Inpatient Hospital Stay (HOSPITAL_COMMUNITY): Payer: Medicare Other

## 2020-05-16 ENCOUNTER — Inpatient Hospital Stay (HOSPITAL_COMMUNITY): Payer: Medicare Other | Admitting: Occupational Therapy

## 2020-05-16 DIAGNOSIS — R7309 Other abnormal glucose: Secondary | ICD-10-CM

## 2020-05-16 LAB — CBC
HCT: 43.8 % (ref 39.0–52.0)
Hemoglobin: 14.2 g/dL (ref 13.0–17.0)
MCH: 30.4 pg (ref 26.0–34.0)
MCHC: 32.4 g/dL (ref 30.0–36.0)
MCV: 93.8 fL (ref 80.0–100.0)
Platelets: 358 10*3/uL (ref 150–400)
RBC: 4.67 MIL/uL (ref 4.22–5.81)
RDW: 13.1 % (ref 11.5–15.5)
WBC: 13.1 10*3/uL — ABNORMAL HIGH (ref 4.0–10.5)
nRBC: 0 % (ref 0.0–0.2)

## 2020-05-16 LAB — GLUCOSE, CAPILLARY
Glucose-Capillary: 114 mg/dL — ABNORMAL HIGH (ref 70–99)
Glucose-Capillary: 127 mg/dL — ABNORMAL HIGH (ref 70–99)
Glucose-Capillary: 185 mg/dL — ABNORMAL HIGH (ref 70–99)
Glucose-Capillary: 193 mg/dL — ABNORMAL HIGH (ref 70–99)
Glucose-Capillary: 238 mg/dL — ABNORMAL HIGH (ref 70–99)
Glucose-Capillary: 272 mg/dL — ABNORMAL HIGH (ref 70–99)

## 2020-05-16 MED ORDER — ENSURE ENLIVE PO LIQD
237.0000 mL | Freq: Two times a day (BID) | ORAL | Status: DC
  Administered 2020-05-16: 237 mL via ORAL

## 2020-05-16 MED ORDER — GLUCERNA SHAKE PO LIQD
237.0000 mL | Freq: Three times a day (TID) | ORAL | Status: DC
  Administered 2020-05-16 – 2020-05-18 (×6): 237 mL via ORAL

## 2020-05-16 MED ORDER — ENSURE MAX PROTEIN PO LIQD
11.0000 [oz_av] | Freq: Two times a day (BID) | ORAL | Status: DC
  Administered 2020-05-16 – 2020-05-18 (×4): 11 [oz_av] via ORAL
  Filled 2020-05-16 (×5): qty 330

## 2020-05-16 NOTE — Patient Care Conference (Signed)
Inpatient RehabilitationTeam Conference and Plan of Care Update Date: 05/16/2020   Time: 3:15 PM    Patient Name: Johnathan Barker      Medical Record Number: [id number]  Date of Birth: 03-25-1941 Sex: Male         Room/Bed: 4W18C/4W18C-01 Payor Info: Payor: MEDICARE / Plan: MEDICARE PART A AND B / Product Type: *No Product type* /    Admit Date/Time:  04/27/2020  6:34 PM  Primary Diagnosis:  Myelopathy ([place])  Patient Active Problem List   Diagnosis Date Noted  . Labile blood glucose   . Sleep disturbance   . Impulsive   . Cognitive communication deficit   . Cognitive and behavioral changes   . Confusion, postoperative   . Sepsis without acute organ dysfunction ([place])   . Aspiration pneumonia of right lower lobe ([place])   . Steroid-induced hyperglycemia   . Postoperative pain   . Urinary retention   . Dysphagia   . Constipation   . Type 2 diabetes mellitus with hyperglycemia, with long-term current use of insulin ([place])   . Benign essential HTN   . Post-operative pain   . Abnormal chest x-ray   . FUO (fever of unknown origin)   . 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 Controlled type 2 diabetes mellitus with hyperglycemia, with long-term current use of insulin ([place])   . Drug induced constipation   . Leucocytosis   . Hyperbilirubinemia   . Myelopathy ([place]) 04/26/2020  . Weakness of right lower extremity 04/24/2020  . Essential hypertension 04/24/2020  . Diabetes mellitus type 2 in nonobese ([place]) 04/24/2020  . Cord compression ([place]) 04/24/2020  . 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 Delice Lesch Care Coodinator Present: Dorien Chihuahua, RN, BSN, CRRN;Christina Sampson Goon, BSW Nurse Present: Other (comment) Suezanne Cheshire, RN) PT Present: Becky Sax, PT OT Present: [name], OT SLP Present: Charolett Bumpers, SLP PPS Coordinator present : Gunnar Fusi, SLP     Current Status/Progress Goal Weekly Team Focus  Bowel/Bladder   Pt is continent of bowel and bladder.  Pt will remain continent while in the hospital.  Pt will be assessed for continence q shif or as needed .   Swallow/Nutrition/ Hydration             ADL's   Min A to CGA dynamic standing and functional transfers with RW, CGA bathing, Min A LB dressing, Min A UB dressing  Supervision bathing, UB dressing and toileting, CGA transfers and dynamic standing and LB dressing  ADL retraining, functional transfers, awareness of deficits, dynamic standing balance, endurance, cognition - sequencing   Mobility   bed mobility supervision, transfers min A, gait 172ft with RW min A, WC mobility 138ft supervision, 8 steps 1 rail min A  supervision overall, min A stairs  functional mobility/transfers, generalized strengthening, dynamic standing balance/coordination, ambulation, stair navigation, family education, and endurance   Communication             Safety/Cognition/ Behavioral Observations            Pain   Pt has no C/O pain  Pt will remain free of pain while in the hospital  Pt will be assessed q shift for pain or as needed.   Skin   Pt's skin is intact  Pt's skin will remain intact while in the hospiital  Pt's skin will be assessed q shift or as needed.  Rehab Goals Patient on target to meet rehab goals: Yes Rehab Goals Revised: patient on target with current goals *See Care Plan and progress notes for long and short-term goals.     Barriers to Discharge  Current Status/Progress Possible Resolutions Date Resolved   Nursing                  PT  Medical stability;Incontinence;Behavior  mild impulsivity, generalized weakness, confusion, decreased motor control/planning              OT Medical stability;Nutrition means;Behavior  impulsive, confusion, decreased sequencing and awareness of impairments             SLP                Care Coordinator Lack of/limited  family support;Home environment access/layout 4 Steps to enter home on target for discharge of 6/25          Discharge Planning/Teaching Needs:  Goal to discharge home with spouse (Supervision/cga)      Team Discussion:  Patient tolerating po diet well. C/o hands, extremities/feet cold. Patient continues to demonstrate impulsiveness and confusion and progress limited by fatigue.  Revisions to Treatment Plan:  MBSS 05/15/20 = advanced to liquid diet. No puree as he demonstrated residue spillage. Outpatient MBSS recommended for continued advancement of diet consistency; recheck residue spillage. Goals were downgraded last week to MIN assist. Patient currently able to ambulate 150' with minimal assistance. Will need assistance to don/doff socks and shoes. Follow up [facility] services recommended.     Medical Summary Current Status: Deficits with mobility, swallowing, transfers, cognition, self-care secondary to myelopathy s/p decompression of cervical and thoracic spine. Weekly Focus/Goal: Improve mobility, BP, WBCs, CBGs, confusion, nutrition  Barriers to Discharge: Behavior;Medical stability;Nutrition means;Incontinence   Possible Resolutions to Barriers: Therapies, continue to advance diet as tolerated, weaned steroids, follow labs, optimize DM/BP meds   Continued Need for Acute Rehabilitation Level of Care: The patient requires daily medical management by a physician with specialized training in physical medicine and rehabilitation for the following reasons: Direction of a multidisciplinary physical rehabilitation program to maximize functional independence : Yes Medical management of patient stability for increased activity during participation in an intensive rehabilitation regime.: Yes Analysis of laboratory values and/or radiology reports with any subsequent need for medication adjustment and/or medical intervention. : Yes   I attest that I was present, lead the team conference, and concur  with the assessment and plan of the team.   Dorien Chihuahua B 05/16/2020, 3:15 PM

## 2020-05-16 NOTE — Progress Notes (Signed)
[place] PHYSICAL MEDICINE & REHABILITATION PROGRESS NOTE  Subjective/Complaints: Patient seen laying in bed this morning, about to work with therapies.  Just waking up.  He states he slept well overnight.  He has some language of confusion.  ROS: Limited due to cognition, appears to deny CP, SOB, N/V/D.  Objective: Vital Signs: Blood pressure 104/77, pulse 100, temperature 98.5 F (36.9 C), temperature source Oral, resp. rate 19, height 6' (1.829 m), weight 92.5 kg, SpO2 96 %. DG Swallowing Func-Speech Pathology  Result Date: 05/15/2020 Objective Swallowing Evaluation: Type of Study: MBS-Modified Barium Swallow Study  Patient Details Name: Johnathan Barker MRN: [id number] Date of Birth: Oct 04, 1941 Today's Date: 05/15/2020 Past Medical History: Past Medical History: Diagnosis Date  Arthritis   knees  Diabetes mellitus without complication ([place])   type II  Dysrhythmia   History of kidney stones   lithotripsy  Hypertension   Weakness of both legs 04/24/2020 Past Surgical History: Past Surgical History: Procedure Laterality Date  ANTERIOR CERVICAL DECOMP/DISCECTOMY FUSION N/A 04/26/2020  Procedure: Cervical Three-Four anterior cervical decompression/discectomy/fusion with plate;  Surgeon: Eustace Moore, MD;  Location: [facility];  Service: Neurosurgery;  Laterality: N/A;  anterior, Part-1  EYE SURGERY Bilateral   cataracts removed= IOL  LUMBAR LAMINECTOMY/DECOMPRESSION MICRODISCECTOMY N/A 04/26/2020  Procedure: Thoracic Ten-Eleven Laminectomy;  Surgeon: Eustace Moore, MD;  Location: [facility];  Service: Neurosurgery;  Laterality: N/A;  Posterior, Part-2  TONSILLECTOMY    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 Suspect pharyngeal s/p surgical edema and reduced pharyngeal space from Cortrak continues negatively impact epiglottic inversion, resulting in incomplete laryngeal vestibule closure and decreased pharyngeal wall contraction. As a result, mod-severe vallecular residue is present, primarily with puree and soft solids. During trial of puree, PO also began to coat the underside of pt's epiglottis and trace penetration noted during subsequent swallow, in addition to the residue that could not be effectively cleared with additional dry swallows. Very minimal vallecular residue noted with thin barium trials (improved since last study). Pt's swallow initiation was timely, and although thin barium via cup and straw intermittently penetrated into the laryngeal vestibule during the swallow, no aspiration events were observed and penetration stayed above the level of the vocal folds in >90% of trials. Due to cognitive impairments, pt unable to follow directions to consume pill whole with thin (he masticated pill), however he deglutated partially masticated pill with very large sip of thin barium without airway intrusion. Of note, pt exhibited throat clearing and reports a discomfort during intake, particularly with puree and soft solids, which suspect is due to edema. Would recommend that pt initiate a thin liquid diet (may have ensures/boosts) with intermittent throat clear and extra dry swallow as compensatory strategies. Medications may be administered whole with thin and ST will continue to provide skilled interventions to work toward diet advancement. Pt would benefit from repeat MBSS prior to solid texture advancement. SLP Visit Diagnosis Dysphagia, pharyngeal phase (R13.13) Attention and  concentration deficit following -- Frontal lobe and executive function deficit following -- Impact on  safety and function Moderate aspiration risk   CHL IP TREATMENT RECOMMENDATION 05/09/2020 Treatment Recommendations Therapy as outlined in treatment plan below   Prognosis 05/09/2020 Prognosis for Safe Diet Advancement -- Barriers to Reach Goals Cognitive deficits Barriers/Prognosis Comment -- CHL IP DIET RECOMMENDATION 05/15/2020 SLP Diet Recommendations Thin liquid;No solids, see liquids Liquid Administration via Cup;Straw Medication Administration Via alternative means Compensations Clear throat after each swallow;Multiple dry swallows after each bite/sip Postural Changes Remain semi-upright after after feeds/meals (Comment)   CHL IP OTHER RECOMMENDATIONS 05/15/2020 Recommended Consults -- Oral Care Recommendations Oral care BID Other Recommendations --   CHL IP FOLLOW UP RECOMMENDATIONS 05/09/2020 Follow up Recommendations Inpatient Rehab   No flowsheet data found.     CHL IP ORAL PHASE 05/15/2020 Oral Phase WFL Oral - Pudding Teaspoon -- Oral - Pudding Cup -- Oral - Honey Teaspoon -- Oral - Honey Cup -- Oral - Nectar Teaspoon NT Oral - Nectar Cup NT Oral - Nectar Straw NT Oral - Thin Teaspoon NT Oral - Thin Cup Holding of bolus Oral - Thin Straw WFL Oral - Puree WFL Oral - Mech Soft WFL Oral - Regular NT Oral - Multi-Consistency NT Oral - Pill Other (Comment) Oral Phase - Comment --  CHL IP PHARYNGEAL PHASE 05/15/2020 Pharyngeal Phase Impaired Pharyngeal- Pudding Teaspoon -- Pharyngeal -- Pharyngeal- Pudding Cup -- Pharyngeal -- Pharyngeal- Honey Teaspoon -- Pharyngeal -- Pharyngeal- Honey Cup -- Pharyngeal -- Pharyngeal- Nectar Teaspoon -- Pharyngeal -- Pharyngeal- Nectar Cup NT Pharyngeal -- Pharyngeal- Nectar Straw NT Pharyngeal -- Pharyngeal- Thin Teaspoon NT Pharyngeal -- Pharyngeal- Thin Cup Reduced epiglottic inversion;Penetration/Aspiration during swallow;Pharyngeal residue - valleculae;Reduced  airway/laryngeal closure Pharyngeal Material enters airway, remains ABOVE vocal cords and not ejected out Pharyngeal- Thin Straw Reduced epiglottic inversion;Reduced airway/laryngeal closure;Penetration/Aspiration during swallow;Pharyngeal residue - valleculae Pharyngeal Material enters airway, CONTACTS cords and not ejected out Pharyngeal- Puree Reduced epiglottic inversion;Reduced airway/laryngeal closure;Penetration/Aspiration during swallow;Delayed swallow initiation-vallecula;Reduced pharyngeal peristalsis Pharyngeal Material enters airway, remains ABOVE vocal cords and not ejected out Pharyngeal- Mechanical Soft -- Pharyngeal -- Pharyngeal- Regular -- Pharyngeal -- Pharyngeal- Multi-consistency NT Pharyngeal -- Pharyngeal- Pill -- Pharyngeal -- Pharyngeal Comment --  CHL IP CERVICAL ESOPHAGEAL PHASE 05/15/2020 Cervical Esophageal Phase WFL Pudding Teaspoon -- Pudding Cup -- Honey Teaspoon -- Honey Cup -- Nectar Teaspoon -- Nectar Cup -- Nectar Straw -- Thin Teaspoon -- Thin Cup -- Thin Straw -- Puree -- Mechanical Soft -- Regular -- Multi-consistency -- Pill -- Cervical Esophageal Comment -- [name] 05/15/2020, 3:25 PM              Recent Labs    05/14/20 0622  WBC 15.5*  HGB 14.1  HCT 43.9  PLT 373   Recent Labs    05/14/20 0621  NA 139  K 4.1  CL 103  CO2 28  GLUCOSE 108*  BUN 25*  CREATININE 0.96  CALCIUM 8.7*    Physical Exam: BP 104/77    Pulse 100    Temp 98.5 F (36.9 C) (Oral)    Resp 19    Ht 6' (1.829 m)    Wt 92.5 kg    SpO2 96%    BMI 27.66 kg/m   Constitutional: No distress . Vital signs reviewed. HENT: Normocephalic.  Atraumatic.  + NG. Eyes: EOMI. No discharge. Cardiovascular: No JVD. Respiratory: Normal effort.  No stridor. GI: Non-distended. Skin: Incision with improving edema Psych: Language of confusion, however improving Musc: No edema in extremities.  No tenderness in extremities. Neurological: Alert and oriented Motor:  4+-5/5 throughout,  stable  Assessment/Plan: 1. 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  Physiatrist is providing close team supervision and 24 hour management of active medical problems listed below.  Physiatrist and rehab team continue to assess barriers to discharge/monitor patient progress toward functional and medical goals  Care Tool:  Bathing    Body parts bathed by patient: Right arm, Left arm, Chest, Abdomen, Right upper leg, Left upper leg, Face, Front perineal area, Buttocks   Body parts bathed by helper: Right lower leg, Left lower leg Body parts n/a: Front perineal area, Buttocks   Bathing assist Assist Level: Contact Guard/Touching assist     Upper Body Dressing/Undressing Upper body dressing   What is the patient wearing?: Pull over shirt    Upper body assist Assist Level: Minimal Assistance - Patient > 75%    Lower Body Dressing/Undressing Lower body dressing      What is the patient wearing?: Pants, Incontinence brief     Lower body assist Assist for lower body dressing: Minimal Assistance - Patient > 75%     Toileting Toileting Toileting Activity did not occur Landscape architect and hygiene only): Refused  Toileting assist Assist for toileting: Contact Guard/Touching assist     Transfers Chair/bed transfer  Transfers assist     Chair/bed transfer assist level: Contact Guard/Touching assist Chair/bed transfer assistive device: Programmer, multimedia   Ambulation assist      Assist level: Contact Guard/Touching assist Assistive device: Walker-rolling Max distance: 75ft   Walk 10 feet activity   Assist  Walk 10 feet activity did not occur: Safety/medical concerns  Assist level: Contact Guard/Touching assist Assistive device: Walker-rolling   Walk 50 feet activity   Assist Walk 50 feet with 2 turns  activity did not occur: Safety/medical concerns  Assist level: Contact Guard/Touching assist Assistive device: Walker-rolling    Walk 150 feet activity   Assist Walk 150 feet activity did not occur: Safety/medical concerns  Assist level: Minimal Assistance - Patient > 75% Assistive device: Walker-rolling    Walk 10 feet on uneven surface  activity   Assist Walk 10 feet on uneven surfaces activity did not occur: Safety/medical concerns   Assist level: Minimal Assistance - Patient > 75% Assistive device: Aeronautical engineer Will patient use wheelchair at discharge?:  (TBD) Type of Wheelchair: Manual Wheelchair activity did not occur: Safety/medical concerns  Wheelchair assist level: Supervision/Verbal cueing Max wheelchair distance: 134ft    Wheelchair 50 feet with 2 turns activity    Assist    Wheelchair 50 feet with 2 turns activity did not occur: Safety/medical concerns   Assist Level: Supervision/Verbal cueing   Wheelchair 150 feet activity     Assist Wheelchair 150 feet activity did not occur: Safety/medical concerns   Assist Level: Supervision/Verbal cueing      Medical Problem List and Plan: 1.  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  Continue CIR  IV Steroids started on 6/5, transition to p.o. on 6/10, decreased on 6/11, decreased further on 6/13, decreased again on 6/16, DC'd on 6/19  MRI brain personally reviewed, unremarkable for acute process  Team conference today to discuss current and goals and coordination of care, home and environmental barriers, and discharge planning with nursing, case manager, and therapies.   Attempted to call wife again,  no answer-left voicemail again. 2.  Antithrombotics: -DVT/anticoagulation:  Mechanical: Sequential compression devices, below knee Bilateral lower extremities              -antiplatelet therapy: N/A 3. Pain Management: Tylenol prn. Does not like Oxycodone.  Ordered ultram prn  Controlled 6/23  Monitor with increased exertion as well as cognition side effects 4. Mood: LCSW to follow for evaluation and support.   Continue telesitter             -antipsychotic agents: N/A 5. Neuropsych: This patient is not fully capable of making decisions on his own behalf.  Discussed with neuropsychology-potential contributors of cognitive deficits, discussed imaging results 6. Skin/Wound Care: Monitor wound for healing.  7. Fluids/Electrolytes/Nutrition: Encourage fluid intake. Monitor I/O.  8. HTN: Monitor BP -continue amlodipine, Irbesartan, and metoprolol.   Labile on 6/23  Monitor with increased mobility 9.  Steroid-induced hyperglycemia on T2DM with hyperglycemia: Hgb A1c- 7.5. Monitor BS ac/hs. Used amaryl 4 mg bid and lantus 2 units HS.   Amaryl on hold at present   Lantus 10, increased to 14 on 6/13  Novolog 5 with meals and at bedtime  Labile on 6/23 10. Leucocytosis: Likely secondary to steroids + PNA.              WBC 15.5 on 6/21, labs pending  UA/urine no growth  Repeat chest x-ray personally reviewed, showing new right middle lobe abnormality.  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  Remains afebrile  CT abdomen/pelvis results reviewed with wife- shows cholelithiasis, renal calculi, aortic atherosclerosis.   Continue to monitor 11. Constipation:              Adjust bowel meds as necessary 12. Hyperbilirubinemia:   Elevated on 6/5, continue to monitor 13. Pharyngeal dysphagia: ST to follow for evaluation.   Discussed with therapies, advance to thin liquids-will monitor for oral intake and DC NG if appropriate.  Continue tube feeds  DCed IVF 14.  Urinary retention             Improving 15.  Sepsis/aspiration pneumonia, associated encephalopathy.   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  Sleep disturbance  Trazodone DC'd, melatonin started on 6/18 18.  Transaminitis  LFTs elevated on 631  Continue to monitor   LOS: 19 days A FACE TO FACE EVALUATION WAS PERFORMED  Hillis Mcphatter Lorie Phenix 05/16/2020, 10:21 AM

## 2020-05-16 NOTE — Progress Notes (Signed)
Calorie Count Note: Day 1  48-hour calorie count ordered. Calorie count was started yesterday at dinner meal.  Spoke with pt's wife to inquire about pt's PO intake yesterday and so far today as no meal slips or written information was available for RD to review.  Per pt's wife, pt completed 100% of apple juice, 33% of chicken broth, and 100% of mango fruit ice at dinner meal yesterday. [name] was not present for breakfast meal and was not aware how pt ate. RN also not aware how pt ate at breakfast meal. [name] shares that pt is currently drinking an Ensure Max supplement and drank 1 Glucerna supplement yesterday. Pt eating from lunch tray at time of RD phone call.  Diet: clear liquids (but can have Ensure, Boost, Glucerna, etc.) Supplements:  - Ensure Max po BID, each supplement provides 150 kcal and 30 grams of protein.  - Ensure Enlive po BID, each supplement provides 350 kcal and 40 grams of protein  Day 1: 6/22 Dinner: 150 kcal, 1 gram protein 6/23 Breakfast: no data available 6/23 Lunch: 164 kcal, 1 gram protein Supplements: 370 kcal, 40 grams protein (1 Glucerna Shake, 1 Ensure Max)  Total 24-hour intake: 684 kcal (33% of minimum estimated needs)  42 grams protein (38% of minimum estimated needs)  Nutrition Diagnosis: Inadequate oral intake related to dysphagia as evidenced by NPO status.  Goal: Patient will meet greater than or equal to 90% of their needs  Intervention:  - Continue 48-hour calorie count - Continue Ensure MAX BID - Add Glucerna Shake TID with meals - d/c Ensure Harriett Rush, MS, RD, LDN Inpatient Clinical Dietitian Pager: [phone] Weekend/After Hours: [phone]

## 2020-05-16 NOTE — Progress Notes (Signed)
Nutrition Follow-up  RD working remotely.  DOCUMENTATION CODES:   Not applicable  INTERVENTION:   - Please obtain updated weight  - Continue 48-hour calorie count, RD to provide results of Day 2 tomorrow (05/17/20)  - Ensure Max po BID, each supplement provides 150 kcal and 30 grams of protein  - Glucerna Shake po TID, each supplement provides 220 kcal and 10 grams of protein  - d/c Pro-stat per tube  - Encourage adequate PO intake  NUTRITION DIAGNOSIS:   Inadequate oral intake related to dysphagia as evidenced by NPO status.  Progressing, pt now on clear liquid diet  GOAL:   Patient will meet greater than or equal to 90% of their needs  Progressing  MONITOR:   Diet advancement, Labs, Weight trends, TF tolerance, Skin, I & O's  REASON FOR ASSESSMENT:   Consult Enteral/tube feeding initiation and management  ASSESSMENT:   79 year old male with PMH of HTN, T2DM, BLE weakness with difficulty walking over past couple of months with frequent falls. Admitted on 04/24/20 with lower extremity weakness and feeling cold. 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  Also found to have right thyroid nodule with recommendations for thyroid ultrasound. On 04/26/20 pt underwent ACDF of C3/C4 and decompressive thoracic laminectomy/medial facetectomy. Admitted to CIR on 04/27/20.  6/05 - concern for aspiration, pt made NPO, NGT placed 6/07 - pt removed NGT overnight, Cortrak placed (tip gastric per Cortrak team)x 2 6/08 - MBSS with recommendations for NPO 6/16 - MBSS with recommendations for NPO 6/22 - MBSS with recommendations for thin liquids only, diet advanced to clear liquids 6/23 - Cortrak removed  Please see Calorie Count Note: Day 1 for the results of the first 24 hours of pt's calorie  count. Information was obtained from pt's wife via phone call.  Medications reviewed and include: pepcid, Glucerna TID, SSI q 4 hours, Lantus 14 units daily, protonix, Ensure Max BID  Labs reviewed. CBG's: 114-203 x 24 hours  UOP: 1075 ml x 24 hours  Diet Order:   Diet Order            Diet clear liquid Room service appropriate? Yes; Fluid consistency: Thin  Diet effective now                 EDUCATION NEEDS:   Education needs have been addressed  Skin:  Skin Assessment: Skin Integrity Issues: Incisions: neck, back  Last BM:  05/15/20 small type 6  Height:   Ht Readings from Last 1 Encounters:  04/27/20 6' (1.829 m)    Weight:   Wt Readings from Last 1 Encounters:  04/27/20 92.5 kg    BMI:  Body mass index is 27.66 kg/m.  Estimated Nutritional Needs:   Kcal:  2100-2300  Protein:  110-125 grams  Fluid:  >/= 2.0 L    Gaynell Face, MS, RD, LDN Inpatient Clinical Dietitian Pager: [phone] Weekend/After Hours: [phone]

## 2020-05-16 NOTE — Progress Notes (Signed)
Patient ID: Johnathan Barker, male   DOB: June 17, 1941, 79 y.o.   MRN: [id number]  Team Conference Report to Patient/Family  Team Conference discussion was reviewed with the patient and caregiver, including goals, any changes in plan of care and target discharge date.  Patient and caregiver express understanding and are in agreement.  The patient has a target discharge date of 05/18/20.  Dyanne Iha 05/16/2020, 1:54 PM

## 2020-05-16 NOTE — Progress Notes (Signed)
Physical Therapy Session Note  Patient Details  Name: Johnathan Barker MRN: [id number] Date of Birth: April 26, 1941  Today's Date: 05/16/2020 PT Individual Time: 0800-0858 PT Individual Time Calculation (min): 58 min   Short Term Goals: Week 2:  PT Short Term Goal 1 (Week 2): Pt will transfer bed<>chair with LRAD min A PT Short Term Goal 1 - Progress (Week 2): Met PT Short Term Goal 2 (Week 2): Pt will ambulate 43f with LRAD mod A PT Short Term Goal 2 - Progress (Week 2): Met PT Short Term Goal 3 (Week 2): Pt will perform simulated car transfer with LRAD min A PT Short Term Goal 3 - Progress (Week 2): Met Week 3:  PT Short Term Goal 1 (Week 3): STG=LTG due to LOS  Skilled Therapeutic Interventions/Progress Updates:   Received pt supine in bed, pt agreeable to therapy, and denied any pain during session. Session with emphasis on dressing, functional mobility/transfers, generalized strengthening, dynamic standing balance/coordination, ambulation, simulated car transfers, stair navigation, and improved activity tolerance. Pt donned pants in supine with mod A and rolled to L and R with supervision and use of bedrails to pull pants over hips. Pt transferred supine<>sitting EOB with supervision and cues for logroll technique and donned shoes max A for time management purposes. Pt transferred bed<>WC stand<>pivot with CGA and cues for hand placement. MD in for morning rounds and RN present to administer medication and flush NG tube. Pt combed hair seated in WC at sink with supervision. [facility] for time management purposes. Pt ambulated 143fon uneven surfaces (ramp) with RW min A with cues for turning technique and RW safety. Pt performed ambulatory car transfer with RW CGA with cues for safe entry to turn and sit prior to getting legs in. Pt stood and picked up small cup from floor with RW and CGA. [facility] and navigated 8 steps with 1  rail on L min A using lateral stepping technique. Pt required cues for stepping sequence, foot placement on step, and overall technique. Pt ambulated 7563fith RW CGA. Pt with mild LE ataxia and flexed trunk and required cues for upright posture and RW safety. [facility] and requested to return to bed. Stand<>pivot WC<>bed CGA and doffed shoes sitting EOB with supervision. Sit<>supine with supervision. Concluded session with pt supine in bed, needs within reach, and bed alarm on. Bilateral wrist restraints donned.   Therapy Documentation Precautions:  Precautions Precautions: Fall Precaution Comments: no brace needed Restrictions Weight Bearing Restrictions: No  Therapy/Group: Individual Therapy AnnAlfonse Alpers, DPT   05/16/2020, 7:21 AM

## 2020-05-16 NOTE — Progress Notes (Signed)
Occupational Therapy Discharge Summary  Patient Details  Name: Johnathan Barker MRN: [id number] Date of Birth: Jan 03, 1941  Today's Date: 05/16/2020  Patient has met 8 of 8 long term goals due to improved activity tolerance, improved balance, postural control, ability to compensate for deficits, improved attention, improved awareness and improved coordination.  Patient to discharge at overall Supervision level.  Patient's care partner is independent to provide the necessary physical and cognitive assistance at discharge.  Pt is returning home with wife who can provide 24/7 care. [name] has attended multiple sessions and received education on ADLs and functional mobility.  Reasons goals not met: n/a  Recommendation:  Patient will benefit from ongoing skilled OT services in home health setting to continue to advance functional skills in the area of BADL and Reduce care partner burden.  Equipment: Pt has reccomended equipment.  Reasons for discharge: treatment goals met and discharge from hospital  Patient/family agrees with progress made and goals achieved: Yes  OT Discharge Precautions/Restrictions  Precautions Precautions: Fall Precaution Comments: no brace needed Restrictions Weight Bearing Restrictions: No Vital Signs Therapy Vitals Pulse Rate: 92 BP: 123/62 Pain Pain Assessment Pain Scale: 0-10 Pain Score: 0-No pain Faces Pain Scale: No hurt ADL ADL Eating: NPO Grooming: Supervision/safety Where Assessed-Grooming: Sitting at sink Upper Body Bathing: Supervision/safety Where Assessed-Upper Body Bathing: Shower Lower Body Bathing: Supervision/safety Where Assessed-Lower Body Bathing: Shower Upper Body Dressing: Supervision/safety Where Assessed-Upper Body Dressing: Edge of bed Lower Body Dressing: Supervision/safety Where Assessed-Lower Body Dressing: Edge of bed Toileting: Supervision/safety Where Assessed-Toileting: Toilet, Recruitment consultant Transfer:  Close supervision Toilet Transfer Method: Ambulating (with RW) Science writer: Radiographer, therapeutic: Not assessed Social research officer, government: Close supervision Social research officer, government Method: Ambulating (with RW) Youth worker: Radio broadcast assistant, Grab bars Vision Baseline Vision/History: Wears glasses Wears Glasses: Reading only Patient Visual Report: No change from baseline (reported no recent blurring of vision) Perception  Perception: Within Functional Limits Praxis Praxis: Impaired Praxis Impairment Details: Motor planning Cognition Overall Cognitive Status: Impaired/Different from baseline Arousal/Alertness: Awake/alert Orientation Level: Oriented to person;Oriented to place;Disoriented to situation;Disoriented to time Focused Attention: Appears intact Sustained Attention: Impaired Memory: Impaired Problem Solving: Impaired Behaviors: Impulsive Safety/Judgment: Impaired Sensation Sensation Light Touch: Appears Intact (on BUE) Proprioception: Impaired by gross assessment Coordination Gross Motor Movements are Fluid and Coordinated: No Fine Motor Movements are Fluid and Coordinated: Yes Coordination and Movement Description: grossly uncoordinated due to LE ataxia, generalized weakness, decreased balance/postural control, and poor motor control/sequencing Finger Nose Finger Test: [facility] Extremity/Trunk Assessment RUE Assessment RUE Assessment: Within Functional Limits General Strength Comments: 5/5 grossly LUE Assessment LUE Assessment: Within Functional Limits General Strength Comments: 5/5 grossly   Michelle Nasuti 05/16/2020, 3:56 PM

## 2020-05-16 NOTE — Progress Notes (Signed)
Per MD order, patient's dobhoff tube was removed and he tolerated without any distress.  Patient's restraints were also discontinued and he is currently in his room with his wife at his bedside.

## 2020-05-16 NOTE — Progress Notes (Signed)
Occupational Therapy Session Note  Patient Details  Name: Johnathan Barker MRN: [id number] Date of Birth: 1941-05-22  Today's Date: 05/16/2020 OT Individual Time: 1047-1130 OT Individual Time Calculation (min): 43 min    Short Term Goals: Week 3:  OT Short Term Goal 1 (Week 3): STGs = LTGs  Skilled Therapeutic Interventions/Progress Updates:    Treatment session with focus on functional transfers and self-care retraining.  Pt received semi-reclined in bed with increased confusion, with frequent complaints of various nonsensical issues in his room.  Pt donned socks and shoes seated EOB with increased time and CGA.  Pt ambulated to sink with RW with CGA, cues for hand placement with sit > stand as pt attempting to pull up from RW.  Completed UB bathing and grooming tasks at sink with cues for sequencing.  Pt reports need to toilet.  Ambulated to toilet with RW with CGA.  Pt completed clothing management up/down with increased time and cues to attempt.  Pt reports fatigue and requesting to return to bed.  Engaged in hand washing in standing with focus on increased activity tolerance prior to returning to bed.  Upon return to bed, pt with returning to nonsensical complaints, pt's wife shaking head and attempting to provide redirection.  Pt remained semi-reclined in bed with wrist restraints on and all needs in reach, wife present throughout session.  Therapy Documentation Precautions:  Precautions Precautions: Fall Precaution Comments: no brace needed Restrictions Weight Bearing Restrictions: No General:   Vital Signs: Therapy Vitals Pulse Rate: 100 BP: 104/77 Pain:   Pt with c/o pain in Rt shoulder.  Applied muscle rub  Therapy/Group: Individual Therapy  [name] 05/16/2020, 11:35 AM

## 2020-05-16 NOTE — Progress Notes (Signed)
Speech Language Pathology Daily Session Note  Patient Details  Name: Johnathan Barker MRN: [id number] Date of Birth: October 23, 1941  Today's Date: 05/16/2020 SLP Individual Time: 1450-1535 SLP Individual Time Calculation (min): 45 min  Short Term Goals: Week 3: SLP Short Term Goal 1 (Week 3): STG=LTG due to remaining LOS  Skilled Therapeutic Interventions: Skilled ST services focused on cognitive skills. SLP provided education to family and pt pertaining to clear liquid diet and intermittent use of swallow strategies. All questions answered to satisfaction. SLP facilitated continued assessment of cognitive linguistic skills given MOCA version 7.2 (n=>26) pt scored 20 out 30 indicating mild cognitive impairments in basic problem solving, attention and short term recall. SLP also facilitated mildly complex problem solving utilizing subsection of Cognistat, construction task pt demonstrated moderate impairment.  Pt was left in room with family, call bell within reach and chair alarm set. ST recommends to continue skilled ST services.      Pain Pain Assessment Pain Scale: Faces Pain Score: 0-No pain Faces Pain Scale: No hurt  Therapy/Group: Individual Therapy  Jakarius Flamenco  [facility] 05/16/2020, 5:38 PM

## 2020-05-16 NOTE — Progress Notes (Signed)
Occupational Therapy Session Note  Patient Details  Name: Johnathan Barker MRN: [id number] Date of Birth: 10/18/1941  Today's Date: 05/16/2020 OT Individual Time: 0277-4128 OT Individual Time Calculation (min): 33 min    Short Term Goals: Week 3:  OT Short Term Goal 1 (Week 3): STGs = LTGs  Skilled Therapeutic Interventions/Progress Updates:    Pt completed supine to sit EOB with supervision using the bed rails for support.  He was then able to donn his shoes sitting EOB with close supervision.  Next, he ambulated down to the therapy gym with use of the RW and min assist.  Ataxic gait noted with all mobility.  Once in the gym, had him work on standing balance without an assistive device while tossing horse shoes.  In standing, he exhibits frequent LOB posteriorly as well as some anteriorly with mod assist needed to correct.  During task, pt abruptly stated he needed to use the bathroom, and in less than a few seconds, had a BM in his brief.  Had him walk back to the room with use of the RW and min assist for completion of toileting tasks and changing of brief.  He needed max assist for removal of soiled brief, but he was able to manage his shorts with overall min assist.  Max assist was also needed for thorough peri hygiene but he was able to assist initially with min assist before therapist had to intervene secondary to the severity of the issue.  Finished session with donning of new brief with max assist from therapist and then pt completed pulling up his pants with min guard assist.  He then walked out to the sink and washed his hands with min instructional cueing for staying inside of the walker.  Finished session with transfer to the recliner and call button and phone in reach.    Therapy Documentation Precautions:  Precautions Precautions: Fall Precaution Comments: no brace needed Restrictions Weight Bearing Restrictions: No  Pain: Pain Assessment Pain Scale: Faces Pain Score: 0-No  pain Faces Pain Scale: No hurt ADL: See Care Tool Section for some details of mobility and selfcare  Therapy/Group: Individual Therapy  Lakashia Collison OTR/L 05/16/2020, 4:25 PM

## 2020-05-17 ENCOUNTER — Inpatient Hospital Stay (HOSPITAL_COMMUNITY): Payer: Medicare Other

## 2020-05-17 LAB — GLUCOSE, CAPILLARY
Glucose-Capillary: 119 mg/dL — ABNORMAL HIGH (ref 70–99)
Glucose-Capillary: 142 mg/dL — ABNORMAL HIGH (ref 70–99)
Glucose-Capillary: 152 mg/dL — ABNORMAL HIGH (ref 70–99)
Glucose-Capillary: 186 mg/dL — ABNORMAL HIGH (ref 70–99)
Glucose-Capillary: 236 mg/dL — ABNORMAL HIGH (ref 70–99)
Glucose-Capillary: 264 mg/dL — ABNORMAL HIGH (ref 70–99)

## 2020-05-17 NOTE — Plan of Care (Signed)
  Problem: Consults Goal: RH SPINAL CORD INJURY PATIENT EDUCATION Description:  See Patient Education module for education specifics.  Outcome: Progressing   Problem: SCI BOWEL ELIMINATION Goal: RH STG MANAGE BOWEL WITH ASSISTANCE Description: STG Manage Bowel with Min Assistance. Outcome: Progressing Goal: RH STG SCI MANAGE BOWEL WITH MEDICATION WITH ASSISTANCE Description: STG SCI Manage bowel with medication with Min assistance. Outcome: Progressing   Problem: SCI BLADDER ELIMINATION Goal: RH STG MANAGE BLADDER WITH ASSISTANCE Description: STG Manage Bladder With Min Assistance Outcome: Progressing   Problem: RH SKIN INTEGRITY Goal: RH STG SKIN FREE OF INFECTION/BREAKDOWN Description: No new breakdown with min assist  Outcome: Progressing Goal: RH STG ABLE TO PERFORM INCISION/WOUND CARE W/ASSISTANCE Description: STG Able To Perform Incision/Wound Care With [employer]. Outcome: Progressing   Problem: RH SAFETY Goal: RH STG ADHERE TO SAFETY PRECAUTIONS W/ASSISTANCE/DEVICE Description: STG Adhere to Safety Precautions With Min Assistance/Device. Outcome: Progressing   Problem: RH PAIN MANAGEMENT Goal: RH STG PAIN MANAGED AT OR BELOW PT'S PAIN GOAL Description: Pt will manage pain at 3 or less on a scale of 0-10.  Outcome: Progressing   Problem: RH KNOWLEDGE DEFICIT SCI Goal: RH STG INCREASE KNOWLEDGE OF SELF CARE AFTER SCI Description: Pt will verbalize increased knowledge of caring for self after SCI including pain management, precautions, and wound care with min assist using educational materials provided by staff.  Outcome: Progressing

## 2020-05-17 NOTE — Progress Notes (Signed)
Inpatient Rehabilitation Care Coordinator  Discharge Note  The overall goal for the admission was met for:   Discharge location: Yes, Home  Length of Stay: Yes, 21 Days  Discharge activity level: Yes  Home/community participation: Yes  Services provided included: MD, RD, PT, OT, SLP, RN, CM, TR, Pharmacy, Hundred: Medicare  Follow-up services arranged: Home Health: [facility] (or additional information): PT OT ST  Patient/Family verbalized understanding of follow-up arrangements: Yes  Individual responsible for coordination of the follow-up plan: Butch Penny [phone]  Confirmed correct DME delivered: Dyanne Iha 05/17/2020    Dyanne Iha

## 2020-05-17 NOTE — Progress Notes (Signed)
Speech Language Pathology Discharge Summary  Patient Details  Name: Johnathan Barker MRN: [id number] Date of Birth: 1941-11-09  Today's Date: 05/17/2020 SLP Individual Time: 5537-4827 SLP Individual Time Calculation (min): 54 min   Skilled Therapeutic Interventions:   Skilled ST services focused on education, swallow and cognitive skills. [name] and son were present for education and treatment session. Pt required min A verbal cues to utilize intermittent throat clear and dry swallow and x1 instance of s/s aspiration delayed cough, while consuming thin via straw. SLP provided education pertaining to clear liquid diet and carryover of interm,ittent swallow strategies and need for repeat MBS with f/u ST services. All questions answered to satisfaction. SLP facilitated reassessment of cognitive skills utilizing subsection of ALFA, pt required supervision A verbal cues for basic money and medication management and min A fade to supervision A verbal cues in verbal problem solving task with use of written aids. This is a marked improvement from prior administration requring mod to max A verbal cues in subsections listed above. Pt was left in room with family,  call bell within reach and chair alarm set. ST recommends to continue skilled ST services.     Patient has met 7 of 7 long term goals.  Patient to discharge at overall Min;Supervision level.  Reasons goals not met:     Clinical Impression/Discharge Summary:   Pt made great progress meeting 7 out 7 goals discharging at min to supervision A. Pt initial demonstrated impaired mentation characterized by language of confusion and reduced attention. SLP targeted sustained attention, basic problem solving and general awareness. Pt's mentation has improved over his length of stay reflecting a score of 20 out 30 (n=>26) on MOCA version 7.2 with continued impairments in basic problem solving, sustained attention and short term recall. Pt demonstrated great  improvement in awareness towards the end of his length of stay. Pt demonstrated improvement in swallow function upgrading from NPO to clear liquid diet with intermittent throat clear and dry swallow, with primary impairment of edema resulting in increased pharyngeal residue. Repeat MBS in 1-2 weeks is recommended to continue texture upgrade with f/u ST services. Education was completed with wife and son pertaining to swallow and cognitive deficits. Pt benefited from skills ST services in order to maximize functional independence and reduce burden of care, requring 24 hour supervision and continued ST services.  Care Partner:  Caregiver Able to Provide Assistance: Yes  Type of Caregiver Assistance: Physical;Cognitive  Recommendation:  Home Health SLP;24 hour supervision/assistance;Outpatient SLP  Rationale for SLP Follow Up: Maximize cognitive function and independence;Maximize swallowing safety;Reduce caregiver burden   Equipment:   N/A  Reasons for discharge: Discharged from hospital   Patient/Family Agrees with Progress Made and Goals Achieved: Yes    Shannah Conteh  [facility] 05/17/2020, 3:46 PM

## 2020-05-17 NOTE — Progress Notes (Signed)
Calorie Count Note: Day 1  48-hour calorie count ordered. Calorie count was started 6/22 at dinner meal and ended today after lunch meal.  Diet: clear liquids (but can have Ensure, Boost, Glucerna, etc.) Supplements:  - Ensure Max po BID, each supplement provides 150 kcal and 30 grams of protein.  - Glucerna Shake po TID, each supplement provides 220 kcal and 10 grams of protein  Day 1: 6/23 Dinner: 275 kcal, 1 gram protein 6/24 Breakfast: 249 kcal, 1 gram protein 6/24 Lunch: 251 kcal, 0 grams protein Supplements: 740 kcal, 80 grams protein (2 Glucerna Shakes, 2 Ensure Max)  Total 24-hour intake: 1515 kcal (72% of minimum estimated needs)  82 grams protein (75% of minimum estimated needs)  Nutrition Diagnosis: Inadequate oral intakerelated to dysphagiaas evidenced by NPO status.  Goal: Patient will meet greater than or equal to 90% of their needs  Intervention:  - d/c 48-hour calorie count - Continue Ensure MAX BID - Continue Glucerna Shake TID with meals   Gaynell Face, MS, RD, LDN Inpatient Clinical Dietitian Pager: [phone] Weekend/After Hours: [phone]

## 2020-05-17 NOTE — Progress Notes (Signed)
Occupational Therapy Session Note  Patient Details  Name: Johnathan Barker MRN: [id number] Date of Birth: [birth year]  Today's Date: 05/17/2020 OT Individual Time: 0131-4388 OT Individual Time Calculation (min): 70 min    Short Term Goals: Week 3:  OT Short Term Goal 1 (Week 3): STGs = LTGs  Skilled Therapeutic Interventions/Progress Updates:   Discharge with focus on ADL retraining, functional transfers, and functional mobility. Pt received semi-reclined in bed reporting no pain. Completed ambulatory transfers with RW to TTB, bed, toilet, and w/c with close supervision with multimodal cues to address posterior bias, problem-solving, and motor planning deficits. Pt presenting with ataxic gait requiring increased proximity for supervision. Provided cueing during transfers to slow pace around corners and at thresholds. Showered with close supervision at TTB, standing to complete perineal hygiene. Donned pants with close supervision seated at EOB with verbal cues for carryover of figure 4 dressing. Pt reported need to toilet; pt toileted with close supervision. Donned shirt, socks, and shoes with supervision and verbal cues for problem-solving. Pt groomed hair and completed oral care in w/c at sink with supervision. Pt reported desire to exercise and leave room; completed functional mobility from gym to room with close supervision due to RLE weakness and decreased problem-solving, providing multimodal cues to pace and navigate environment. Ended session with pt semi-reclined in bed with all needs and reach and bed alarm on. Pt demonstrated poor awareness of situation, discussing concerns about devices in room.  Therapy Documentation Precautions:  Precautions Precautions: Fall Precaution Comments: no brace needed Restrictions Weight Bearing Restrictions: No Vital Signs: Therapy Vitals Pulse Rate: 92 BP: 123/62 Pain: Pain Assessment Pain Scale: 0-10 Pain Score: 0-No pain Faces Pain Scale: No  hurt  Therapy/Group: Individual Therapy  Michelle Nasuti 05/17/2020, 9:09 AM

## 2020-05-17 NOTE — Progress Notes (Signed)
Physical Therapy Session Note  Patient Details  Name: Johnathan Barker MRN: [id number] Date of Birth: [birth year]  Today's Date: 05/17/2020 PT Individual Time: 1100-1200 PT Individual Time Calculation (min): 60 min   Short Term Goals: Week 1:  PT Short Term Goal 1 (Week 1): Pt will complete least restrictive transfer with min A consistently PT Short Term Goal 1 - Progress (Week 1): Progressing toward goal PT Short Term Goal 2 (Week 1): Pt will ambulate x 50 ft with LRAD and mod A PT Short Term Goal 2 - Progress (Week 1): Progressing toward goal PT Short Term Goal 3 (Week 1): Pt will maintain dynamic standing balance with min A x 5 min PT Short Term Goal 3 - Progress (Week 1): Progressing toward goal Week 2:  PT Short Term Goal 1 (Week 2): Pt will transfer bed<>chair with LRAD min A PT Short Term Goal 1 - Progress (Week 2): Met PT Short Term Goal 2 (Week 2): Pt will ambulate 32f with LRAD mod A PT Short Term Goal 2 - Progress (Week 2): Met PT Short Term Goal 3 (Week 2): Pt will perform simulated car transfer with LRAD min A PT Short Term Goal 3 - Progress (Week 2): Met Week 3:  PT Short Term Goal 1 (Week 3): STG=LTG due to LOS  Skilled Therapeutic Interventions/Progress Updates:     PAIN pt initially supine, states he has "little crick" in his neck but did not quantify.  Treatment to tolerance.  Pt confused, requests therapist align telesitter w/foot of bed x 5 min for "experiment to cure cold feet".   Pt performed bed mobility w/supervision.  Supine to sit w/supervision.  Dons shoes in sitting w/supervison. SPT bed to wc w/RW w/cga.  wc propulsion x [id number] controlled environment w/supervision.  Car transfer performed w/cga and cues for safety/sequencing.  wc propulsion thru cones including tight turns and backing w/supervision/cues  Functional gait involving weaving thru cones, backing w/cga w/rw, mild ataxia x 5088fGait [id number] and close supervison including turns, mild  ataxia  Stairs: ascends/descends 4 stairs w/2 rails lateral approach w/cga, notable ataxia.   wc propulsion x 125 w/supervision.  Cues for safe set up and operation of wc parts. wc to bed SPT w/RW and cga.  Doffs shoes w/supervision.  Sit to supine w/supervis8ion.  Doffs shirt then decides to again don shirt w/supervision only.   Pt left supine w/rails up x 3, alarm set, bed in lowest position, and needs in reach.  Therapy Documentation Precautions:  Precautions Precautions: Fall Precaution Comments: no brace needed Restrictions Weight Bearing Restrictions: No   Therapy/Group: Individual Therapy  BarbCallie Fielding  [place]4/2021, 12:05 PM

## 2020-05-17 NOTE — Progress Notes (Signed)
[facility] PHYSICAL MEDICINE & REHABILITATION PROGRESS NOTE  Subjective/Complaints: Patient sitting up working with therapy this morning.  He states he believes he slept well overnight.  Appears to be more appropriate.  He states he is trying to eat as much as he can.  He is pleased without the tube in his nose.  Discussed with therapies, moving well, requiring supervision.  ROS: Limited due to cognition, appears to deny CP, SOB, N/V/D.  Objective: Vital Signs: Blood pressure 123/62, pulse 92, temperature 97.6 F (36.4 C), temperature source Axillary, resp. rate 18, height 6' (1.829 m), weight 88.5 kg, SpO2 98 %. No results found. Recent Labs    05/16/20 1341  WBC 13.1*  HGB 14.2  HCT 43.8  PLT 358   No results for input(s): NA, K, CL, CO2, GLUCOSE, BUN, CREATININE, CALCIUM in the last 72 hours.  Physical Exam: BP 123/62   Pulse 92   Temp 97.6 F (36.4 C) (Axillary)   Resp 18   Ht 6' (1.829 m)   Wt 88.5 kg   SpO2 98%   BMI 26.46 kg/m   Constitutional: No distress . Vital signs reviewed. HENT: Normocephalic.  Atraumatic. Eyes: EOMI. No discharge. Cardiovascular: No JVD. Respiratory: Normal effort.  No stridor. GI: Non-distended. Skin: Incision with improving edema Psych: Confusion appears to be improving Musc: No edema in extremities.  No tenderness in extremities. Neurological: Alert and oriented, except for date of month Motor: 4+-5/5 throughout, unchanged  Assessment/Plan: 1. 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  Physiatrist is providing close team supervision and 24 hour management of active medical problems listed below.  Physiatrist and rehab team continue to assess barriers to discharge/monitor patient progress toward functional and medical goals  Care Tool:  Bathing    Body parts bathed by patient: Right arm, Left arm,  Chest, Abdomen, Front perineal area, Buttocks, Right upper leg, Left upper leg, Right lower leg, Left lower leg, Face   Body parts bathed by helper: Buttocks Body parts n/a: Front perineal area, Buttocks   Bathing assist Assist Level: Supervision/Verbal cueing     Upper Body Dressing/Undressing Upper body dressing   What is the patient wearing?: Pull over shirt    Upper body assist Assist Level: Supervision/Verbal cueing    Lower Body Dressing/Undressing Lower body dressing      What is the patient wearing?: Pants, Incontinence brief     Lower body assist Assist for lower body dressing: Supervision/Verbal cueing     Toileting Toileting Toileting Activity did not occur (Clothing management and hygiene only): Refused  Toileting assist Assist for toileting: Supervision/Verbal cueing     Transfers Chair/bed transfer  Transfers assist     Chair/bed transfer assist level: Minimal Assistance - Patient > 75% Chair/bed transfer assistive device: Museum/gallery exhibitions officer assist      Assist level: Contact Guard/Touching assist Assistive device: Walker-rolling Max distance: 8ft   Walk 10 feet activity   Assist  Walk 10 feet activity did not occur: Safety/medical concerns  Assist level: Contact Guard/Touching assist Assistive device: Walker-rolling   Walk 50 feet activity   Assist Walk 50 feet with 2 turns activity did not occur: Safety/medical concerns  Assist level: Contact Guard/Touching assist Assistive device: Walker-rolling    Walk 150 feet activity   Assist Walk 150 feet activity did not occur: Safety/medical concerns  Assist level: Minimal Assistance - Patient >  75% Assistive device: Walker-rolling    Walk 10 feet on uneven surface  activity   Assist Walk 10 feet on uneven surfaces activity did not occur: Safety/medical concerns   Assist level: Minimal Assistance - Patient > 75% Assistive device: Horticulturist, commercial Will patient use wheelchair at discharge?:  (TBD) Type of Wheelchair: Manual Wheelchair activity did not occur: Safety/medical concerns  Wheelchair assist level: Supervision/Verbal cueing Max wheelchair distance: 158ft    Wheelchair 50 feet with 2 turns activity    Assist    Wheelchair 50 feet with 2 turns activity did not occur: Safety/medical concerns   Assist Level: Supervision/Verbal cueing   Wheelchair 150 feet activity     Assist Wheelchair 150 feet activity did not occur: Safety/medical concerns   Assist Level: Supervision/Verbal cueing      Medical Problem List and Plan: 1.  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  Continue CIR  IV Steroids started on 6/5, transition to p.o. on 6/10, decreased on 6/11, decreased further on 6/13, decreased again on 6/16, DC'd on 6/19  MRI brain personally reviewed, unremarkable for acute process  Team conference today to discuss current and goals and coordination of care, home and environmental barriers, and discharge planning with nursing, case manager, and therapies.   Attempted to call wife again, no answer-left voicemail again. 2.  Antithrombotics: -DVT/anticoagulation:  Mechanical: Sequential compression devices, below knee Bilateral lower extremities             -antiplatelet therapy: N/A 3. Pain Management: Tylenol prn. Does not like Oxycodone.  Ordered ultram prn  Controlled 6/24  Monitor with increased exertion as well as cognition side effects 4. Mood: LCSW to follow for evaluation and support.   Continue telesitter             -antipsychotic agents: N/A 5. Neuropsych: This patient is not fully capable of making decisions on his own behalf.  Discussed with neuropsychology-potential contributors of cognitive deficits, discussed imaging results 6. Skin/Wound Care: Monitor wound for  healing.  7. Fluids/Electrolytes/Nutrition: Encourage fluid intake. Monitor I/O.  8. HTN: Monitor BP -continue amlodipine, Irbesartan, and metoprolol.   Relatively controlled on 6/24  Monitor with increased mobility 9.  Steroid-induced hyperglycemia on T2DM with hyperglycemia: Hgb A1c- 7.5. Monitor BS ac/hs. Used amaryl 4 mg bid and lantus 2 units HS.   Amaryl on hold at present   Lantus 10, increased to 14 on 6/13  Novolog 5 with meals and at bedtime  Labile on 6/24 and adjust meds, now that patient no longer on tube feeds 10. Leucocytosis: Likely secondary to steroids + PNA.              WBC 13.1 on 6/23  UA/urine no growth  Repeat chest x-ray personally reviewed, showing new right middle lobe abnormality.  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  Remains afebrile  CT abdomen/pelvis results reviewed with wife- shows cholelithiasis, renal calculi, aortic atherosclerosis.   Continue to monitor 11. Constipation:              Adjust bowel meds as necessary 12. Hyperbilirubinemia:   Elevated on 6/5, continue to monitor 13. Pharyngeal dysphagia: ST to follow for evaluation.   Discussed with therapies, advance to thin liquids with protein shakes  DCed tube feeds  DCed IVF 14.  Urinary retention             Improving 15.  Sepsis/aspiration  pneumonia, associated encephalopathy.   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  Sleep disturbance  Trazodone DC'd, melatonin  started on 6/18  Improving 18.  Transaminitis  LFTs elevated on 631  Continue to monitor   LOS: 20 days A FACE TO FACE EVALUATION WAS PERFORMED  Mionna Advincula Lorie Phenix 05/17/2020, 11:05 AM

## 2020-05-18 LAB — GLUCOSE, CAPILLARY
Glucose-Capillary: 119 mg/dL — ABNORMAL HIGH (ref 70–99)
Glucose-Capillary: 146 mg/dL — ABNORMAL HIGH (ref 70–99)
Glucose-Capillary: 174 mg/dL — ABNORMAL HIGH (ref 70–99)

## 2020-05-18 MED ORDER — AMLODIPINE BESYLATE 5 MG PO TABS: 5.0000 mg | ORAL_TABLET | Freq: Every day | ORAL | 0 refills | Status: AC

## 2020-05-18 MED ORDER — MUSCLE RUB 10-15 % EX CREA: 1.0000 "application " | TOPICAL_CREAM | Freq: Three times a day (TID) | CUTANEOUS | 0 refills | Status: AC

## 2020-05-18 MED ORDER — MELATONIN 300 MCG PO TABS: 1.5000 mg | ORAL_TABLET | Freq: Every day | ORAL | 0 refills | Status: DC

## 2020-05-18 MED ORDER — FAMOTIDINE 10 MG PO TABS: 10.0000 mg | ORAL_TABLET | Freq: Two times a day (BID) | ORAL | 0 refills | Status: AC

## 2020-05-18 MED ORDER — PANTOPRAZOLE SODIUM 40 MG PO TBEC
40.0000 mg | DELAYED_RELEASE_TABLET | Freq: Two times a day (BID) | ORAL | 1 refills | Status: AC
Start: 2020-05-18 — End: 2021-05-18

## 2020-05-18 MED ORDER — BASAGLAR KWIKPEN 100 UNIT/ML ~~LOC~~ SOPN: 14.0000 [IU] | PEN_INJECTOR | Freq: Every day | SUBCUTANEOUS | 0 refills | Status: AC

## 2020-05-18 MED ORDER — METOPROLOL SUCCINATE ER 50 MG PO TB24: 50.0000 mg | ORAL_TABLET | Freq: Two times a day (BID) | ORAL | 0 refills | Status: AC

## 2020-05-18 MED ORDER — MELATONIN 300 MCG PO TABS: 150.0000 ug | ORAL_TABLET | Freq: Every day | ORAL | 0 refills | Status: AC

## 2020-05-18 NOTE — Progress Notes (Signed)
Occupational Therapy Discharge Summary  Patient Details  Name: Johnathan Barker MRN: [id number] Date of Birth: 1941/05/28  Patient has met 8 of 8 long term goals due to improved activity tolerance, improved balance, postural control and ability to compensate for deficits.  Patient to discharge at overall Supervision level.  Patient's care partner is independent to provide the necessary physical and cognitive assistance at discharge.  Patient's wife and local son have been present for multiple therapy sessions and pt's son has demonstrated competence with mobility and self-care tasks.  Pt continues to demonstrate fluctuating levels of cognition and requires cues and redirection for orientation to time and situation.  Reasons goals not met: N/A  Recommendation:  Patient will benefit from ongoing skilled OT services in home health setting to continue to advance functional skills in the area of BADL and Reduce care partner burden.  Equipment: No equipment provided  Reasons for discharge: treatment goals met and discharge from hospital  Patient/family agrees with progress made and goals achieved: Yes  OT Discharge Precautions/Restrictions  Precautions Precautions: Fall Precaution Comments: no brace needed Restrictions Weight Bearing Restrictions: No General   Vital Signs Therapy Vitals Pulse Rate: 82 BP: 116/63 Pain Pain Assessment Pain Scale: 0-10 Pain Score: 4  Pain Type: Acute pain Pain Location: Shoulder Pain Orientation: Right;Left Pain Descriptors / Indicators: Aching;Discomfort Pain Onset: On-going Patients Stated Pain Goal: 0 Pain Intervention(s): Medication (See eMAR);Repositioned ADL ADL Eating: Minimal assistance Where Assessed-Eating: Chair Grooming: Supervision/safety Where Assessed-Grooming: Sitting at sink Upper Body Bathing: Supervision/safety Where Assessed-Upper Body Bathing: Shower Lower Body Bathing: Supervision/safety Where Assessed-Lower Body  Bathing: Shower Upper Body Dressing: Supervision/safety Where Assessed-Upper Body Dressing: Edge of bed Lower Body Dressing: Supervision/safety Where Assessed-Lower Body Dressing: Edge of bed Toileting: Supervision/safety Where Assessed-Toileting: Toilet, Recruitment consultant Transfer: Close supervision Toilet Transfer Method: Ambulating (with RW) Science writer: Radiographer, therapeutic: Not assessed Social research officer, government: Close supervision Social research officer, government Method: Ambulating (with RW) Youth worker: Radio broadcast assistant, Grab bars Vision Baseline Vision/History: Wears glasses Wears Glasses: Reading only Patient Visual Report: No change from baseline Additional Comments: difficult to assess due to increased confusion and difficulty following more abstract commands Perception  Perception: Within Functional Limits Praxis Praxis: Impaired Praxis Impairment Details: Motor planning Cognition Overall Cognitive Status: Impaired/Different from baseline Arousal/Alertness: Awake/alert Orientation Level: Oriented to person;Oriented to place;Oriented to situation;Disoriented to time Attention: Sustained Focused Attention: Appears intact Sustained Attention: Impaired Memory: Impaired Memory Impairment: Decreased recall of new information;Decreased short term memory Awareness: Impaired Awareness Impairment: Emergent impairment Problem Solving: Impaired Behaviors: Impulsive Safety/Judgment: Impaired Comments: mild impulsivity Sensation Sensation Light Touch: Appears Intact Proprioception: Impaired by gross assessment Proprioception Impaired Details: Impaired RLE;Impaired LLE Additional Comments: Pt states he can't always feel his legs. Coordination Gross Motor Movements are Fluid and Coordinated: No Fine Motor Movements are Fluid and Coordinated: Yes Coordination and Movement Description: grossly uncoordinated due to LE ataxia, generalized  weakness, decreased balance/postural control, and poor motor control/sequencing Finger Nose Finger Test: [facility] Extremity/Trunk Assessment RUE Assessment RUE Assessment: Within Functional Limits General Strength Comments: 5/5 grossly LUE Assessment LUE Assessment: Within Functional Limits General Strength Comments: 5/5 grossly   Muriah Harsha, [facility] 05/18/2020, 9:25 AM

## 2020-05-18 NOTE — Discharge Instructions (Signed)
Inpatient Rehab Discharge Instructions  Nour Scalise Mohabir Discharge date and time: 05/18/20   Activities/Precautions/ Functional Status: Activity: no lifting, driving, or strenuous exercise for till cleared by MD Diet: Liquid diet Wound Care: keep wound clean and dry   Functional status:  ___ No restrictions     ___ Walk up steps independently _X__ 24/7 supervision/assistance   ___ Walk up steps with assistance ___ Intermittent supervision/assistance  ___ Bathe/dress independently ___ Walk with walker     ___ Bathe/dress with assistance ___ Walk Independently    ___ Shower independently ___ Walk with assistance    _X__ Shower with assistance _X__ No alcohol     ___ Return to work/school ________   Special Instructions:  COMMUNITY REFERRALS UPON DISCHARGE:    Home Health:   PT     OT     ST                     Agency: [facility] Phone: [phone]      My questions have been answered and I understand these instructions. I will adhere to these goals and the provided educational materials after my discharge from the hospital.  Patient/Caregiver Signature _______________________________ Date __________  Clinician Signature _______________________________________ Date __________  Please bring this form and your medication list with you to all your follow-up doctor's appointments.

## 2020-05-18 NOTE — Progress Notes (Addendum)
[place] PHYSICAL MEDICINE & REHABILITATION PROGRESS NOTE  Subjective/Complaints: Patient seen sitting up in bed this AM.  He states he slept well overnight. He is aware of discharge.   ROS: Limited due to cognition, appears to deny CP, SOB, N/V/D.  Objective: Vital Signs: Blood pressure 116/63, pulse 82, temperature 97.6 F (36.4 C), temperature source Oral, resp. rate 16, height 6' (1.829 m), weight 88.5 kg, SpO2 97 %. No results found. Recent Labs    05/16/20 1341  WBC 13.1*  HGB 14.2  HCT 43.8  PLT 358   No results for input(s): NA, K, CL, CO2, GLUCOSE, BUN, CREATININE, CALCIUM in the last 72 hours.  Physical Exam: BP 116/63   Pulse 82   Temp 97.6 F (36.4 C) (Oral)   Resp 16   Ht 6' (1.829 m)   Wt 88.5 kg   SpO2 97%   BMI 26.46 kg/m   Constitutional: No distress . Vital signs reviewed. HENT: Normocephalic.  Atraumatic. Eyes: EOMI. No discharge. Cardiovascular: No JVD. Respiratory: Normal effort.  No stridor. GI: Non-distended. Skin: Incision with improving edema Psych: Confusion, improving Musc: No edema in extremities.  No tenderness in extremities. Neurological: Alert and oriented, except for date of month Motor: 4+-5/5 throughout, stable  Assessment/Plan: 1. 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  Physiatrist is providing close team supervision and 24 hour management of active medical problems listed below.  Physiatrist and rehab team continue to assess barriers to discharge/monitor patient progress toward functional and medical goals  Care Tool:  Bathing    Body parts bathed by patient: Right arm, Left arm, Chest, Abdomen, Front perineal area, Buttocks, Right upper leg, Left upper leg, Right lower leg, Left lower leg, Face   Body parts bathed by helper: Buttocks Body parts n/a: Front perineal area, Buttocks    Bathing assist Assist Level: Supervision/Verbal cueing     Upper Body Dressing/Undressing Upper body dressing   What is the patient wearing?: Pull over shirt    Upper body assist Assist Level: Supervision/Verbal cueing    Lower Body Dressing/Undressing Lower body dressing      What is the patient wearing?: Pants, Incontinence brief     Lower body assist Assist for lower body dressing: Supervision/Verbal cueing     Toileting Toileting Toileting Activity did not occur (Clothing management and hygiene only): Refused  Toileting assist Assist for toileting: Supervision/Verbal cueing     Transfers Chair/bed transfer  Transfers assist     Chair/bed transfer assist level: Contact Guard/Touching assist Chair/bed transfer assistive device: Programmer, multimedia   Ambulation assist      Assist level: Contact Guard/Touching assist Assistive device: Walker-rolling Max distance: 150   Walk 10 feet activity   Assist  Walk 10 feet activity did not occur: Safety/medical concerns  Assist level: Contact Guard/Touching assist Assistive device: Walker-rolling   Walk 50 feet activity   Assist Walk 50 feet with 2 turns activity did not occur: Safety/medical concerns  Assist level: Contact Guard/Touching assist Assistive device: Walker-rolling    Walk 150 feet activity   Assist Walk 150 feet activity did not occur: Safety/medical concerns  Assist level: Contact Guard/Touching assist Assistive device: Walker-rolling    Walk 10 feet on uneven surface  activity   Assist Walk 10 feet on uneven surfaces activity did not occur: Safety/medical concerns   Assist level: Minimal Assistance - Patient > 75% Assistive device:  Walker-rolling   Wheelchair     Assist Will patient use wheelchair at discharge?:  (TBD) Type of Wheelchair: Manual Wheelchair activity did not occur: Safety/medical concerns  Wheelchair assist level: Supervision/Verbal  cueing Max wheelchair distance: 200    Wheelchair 50 feet with 2 turns activity    Assist    Wheelchair 50 feet with 2 turns activity did not occur: Safety/medical concerns   Assist Level: Supervision/Verbal cueing   Wheelchair 150 feet activity     Assist Wheelchair 150 feet activity did not occur: Safety/medical concerns   Assist Level: Supervision/Verbal cueing      Medical Problem List and Plan: 1.  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  DC today  Will see patient for transitional care management in 1-2 weeks post-discharge  IV Steroids started on 6/5, transition to p.o. on 6/10, decreased on 6/11, decreased further on 6/13, decreased again on 6/16, DC'd on 6/19  MRI brain personally reviewed, unremarkable for acute process  Discussed progress with wife, questions answered 2.  Antithrombotics: -DVT/anticoagulation:  Mechanical: Sequential compression devices, below knee Bilateral lower extremities             -antiplatelet therapy: N/A 3. Pain Management: Tylenol prn. Does not like Oxycodone.  Ordered ultram prn  Controlled 6/25  Monitor with increased exertion as well as cognition side effects 4. Mood: LCSW to follow for evaluation and support.   Continue telesitter             -antipsychotic agents: N/A 5. Neuropsych: This patient is not fully capable of making decisions on his own behalf.  Discussed with neuropsychology-potential contributors of cognitive deficits, discussed imaging results 6. Skin/Wound Care: Monitor wound for healing.  7. Fluids/Electrolytes/Nutrition: Encourage fluid intake. Monitor I/O.  8. HTN: Monitor BP -continue amlodipine, Irbesartan, and metoprolol.   Relatively controlled on 6/24  Monitor with increased mobility 9.  Steroid-induced hyperglycemia on T2DM with hyperglycemia: Hgb A1c- 7.5. Monitor BS ac/hs. Used amaryl 4 mg bid and  lantus 2 units HS.   Amaryl on hold at present   Lantus 10, increased to 14 on 6/13  Novolog 5 with meals and at bedtime  Labile on 6/25, cont to monitor in ambulatory setting with adjustments as necessary 10. Leucocytosis: Likely secondary to steroids + PNA.              WBC 13.1 on 6/23, improving  UA/urine no growth  Repeat chest x-ray personally reviewed, showing new right middle lobe abnormality.  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  Remains afebrile  CT abdomen/pelvis results reviewed with wife- shows cholelithiasis, renal calculi, aortic atherosclerosis.   Continue to monitor 11. Constipation:              Adjust bowel meds as necessary 12. Hyperbilirubinemia:   Elevated on 6/5, continue to monitor 13. Pharyngeal dysphagia: ST to follow for evaluation.   Discussed with therapies, advance to thin liquids with protein shakes  DCed tube feeds  DCed IVF 14.  Urinary retention             Improving 15.  Sepsis/aspiration pneumonia, associated encephalopathy.   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  Sleep disturbance  Trazodone DC'd, melatonin started on 6/18  Improving 18.  Transaminitis  LFTs elevated on 631  Continue to monitor  > 30 minutes spent in total in discharge planning between myself and PA regarding aforementioned, as well discussion regarding DME equipment, follow-up appointments, follow-up therapies, discharge medications, discharge  recommendations   LOS: 21 days A FACE TO FACE EVALUATION WAS PERFORMED  Eloise Picone Lorie Phenix 05/18/2020, 10:54 AM

## 2020-05-18 NOTE — Progress Notes (Signed)
Patient was given D/C instructions by Algis Liming, PA-C. All belongings have been packed and sent with patient. Patient was accompanied by wife. No complaints or concerns to report at this time. Amanda Cockayne, LPN

## 2020-05-21 NOTE — Discharge Summary (Addendum)
Physician Discharge Summary  Patient ID: Johnathan Barker MRN: [id number] DOB/AGE: [birth year] 79 y.o.  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 There is suspected mild fibrosis in the lung bases. There is new ill-defined opacity in the periphery of the right mid lung. Heart size and pulmonary vascularity are normal. No adenopathy. Feeding tube extends below the diaphragm. There is degenerative change in the thoracic spine. IMPRESSION: Airspace opacity in the periphery of the right mid lung concerning for developing pneumonia or possible aspiration. Mild fibrosis in the bases. Lungs elsewhere clear. Cardiac silhouette normal. No adenopathy. Electronically Signed   By: Lowella Grip III M.D.   On: 05/08/2020 10:07   DG Chest 2 View  Result Date: 04/30/2020 CLINICAL DATA:  Abnormal chest radiograph EXAM: CHEST - 2 VIEW COMPARISON:  Radiograph 04/28/2020 FINDINGS: Diffuse reticular opacities throughout both lungs are similar to prior with more coalescent opacity in the posterior right lower lobe. Overall extent is similar to the comparison radiograph. No new areas of consolidative airspace disease. Some streaky opacities towards the bases likely reflecting some atelectatic changes well. No effusion or pneumothorax. Stable  cardiomediastinal contours. No acute osseous or soft tissue abnormality. Degenerative changes are present in the imaged spine and shoulders. IMPRESSION: Diffuse reticular opacities throughout both lungs are similar to prior with persistent coalescent opacity in the posterior right lower lobe. Overall extent not significantly changed from prior accounting for some shifting atelectatic changes. Electronically Signed   By: Lovena Le M.D.   On: 04/30/2020 06:51   DG Chest 2 View  Result Date: 04/28/2020 CLINICAL DATA:  Productive cough, patient coughing up copious amounts of yellow mucus. Hx of diabetes, hypertension. Nonsmoker. EXAM: CHEST - 2 VIEW COMPARISON:  04/27/2020 FINDINGS: Relatively low volumes. Mild peripheral and basilar interstitial somewhat reticular opacities as before. Some increase in coarse airspace opacities versus atelectasis in the posterior right lower lobe. Heart size upper limits normal.  No effusion. No pneumothorax. Visualized bones unremarkable. IMPRESSION: Worsening posterior right lower lobe airspace disease Electronically Signed   By: Lucrezia Europe M.D.   On: 04/28/2020 11:31   DG Chest 2 View  Result Date: 04/27/2020 CLINICAL DATA:  Wheezing.  Cough. EXAM: CHEST - 2 VIEW COMPARISON:  None. FINDINGS: Low lung volumes. Upper normal heart size with normal mediastinal contours. There are fine reticular opacities in a peripheral and basilar predominant distribution. No confluent airspace disease. No pleural effusion or pneumothorax. Degenerative change in the spine. No acute osseous abnormalities are seen. IMPRESSION: Fine reticular opacities in a peripheral and basilar predominant distribution, suspicious for interstitial lung disease. Possibility of atypical infection is also considered. Consider follow-up high-resolution chest CT. Electronically Signed   By: Keith Rake M.D.   On: 04/27/2020 20:51   DG Abd 1 View  Result Date: 04/28/2020 CLINICAL DATA:  Follow-up nasogastric  tube placement. EXAM: ABDOMEN - 1 VIEW COMPARISON:  April 28, 2020 (7:16 p.m.) FINDINGS: A nasogastric tube is seen with its distal tip noted within the body of the stomach. This is approximately 8 cm distal to the gastroesophageal junction. The bowel gas pattern is normal. Multiple bilateral small renal calculi are noted. IMPRESSION: 1. Nasogastric tube positioning, as described above. Further advancement of the NG tube by approximately 5 cm is recommended to further decrease the risk of aspiration. 2. Multiple bilateral small renal calculi. Electronically Signed   By: Virgina Norfolk M.D.   On: 04/28/2020 23:40   DG Abd 1 View  Result Date: 04/28/2020 CLINICAL DATA:  NG tube placement EXAM: ABDOMEN - 1 VIEW COMPARISON:  Radiograph 04/28/2020 FINDINGS: Nasogastric tube again seen curling in the lower thoracic esophagus, redirected superiorly. Redemonstration of a single air distended loop of what appears to be colon in the mid to lower abdomen. Is unchanged in appearance from comparison study. Bowel gas pattern is otherwise unremarkable. Calcifications overlie the bilateral renal shadows, predominantly upper pole of the right lower pole on the left. Severe multilevel discogenic change throughout the spine with exuberant osteophyte formation. Additional degenerative changes of the SI joints and both hips. IMPRESSION: 1. Stable appearance of a single air distended loop of what appears to be colon in the mid to lower abdomen. No high-grade obstructive pattern. 2. Nasogastric tube remains curled in the lower thoracic esophagus, redirected superiorly. Recommend repositioning. 3. Bilateral nephrolithiasis. Electronically Signed   By: Lovena Le M.D.   On: 04/28/2020 19:50    MR BRAIN WO CONTRAST  Result Date: 04/24/2020 CLINICAL DATA:  Focal neurological deficit, stroke suspected. EXAM: MRI HEAD WITHOUT CONTRAST TECHNIQUE: Multiplanar, multiecho pulse sequences of the brain and surrounding structures were  obtained without intravenous contrast. COMPARISON:  04/24/2020 CT head. FINDINGS: Brain: No diffusion-weighted signal abnormality to suggest acute infarct. Mild diffuse parenchymal volume loss with ex vacuo dilatation. Scattered periventricular and deep white matter T2/FLAIR hyperintensities are nonspecific however commonly associated with chronic microvascular ischemic changes. 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 Left frontal superficial siderosis. No midline shift or extra-axial fluid collection. No mass lesion. Vascular: Normal flow voids. Skull and upper cervical spine: Normal marrow signal. Sinuses/Orbits: Sequela of bilateral lens replacement. Clear paranasal sinuses. No mastoid effusion. Other: None. IMPRESSION: No acute intracranial process.  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 Mild cerebral atrophy and chronic microvascular ischemic changes. Electronically Signed   By: Primitivo Gauze M.D.   On: 04/24/2020 09:16    CT ABDOMEN PELVIS W CONTRAST  Result Date: 05/11/2020 CLINICAL DATA:  Right upper quadrant abdominal pain, fever, and elevated white blood count. Positive Murphy's sign. EXAM: CT ABDOMEN AND PELVIS WITH CONTRAST TECHNIQUE: Multidetector CT imaging of the abdomen and pelvis was performed using the standard protocol following bolus administration of intravenous contrast. CONTRAST:  147mL OMNIPAQUE IOHEXOL 300 MG/ML  SOLN COMPARISON:  Abdominal radiograph dated 04/28/2020 and CT scan of the chest dated 05/09/2019 FINDINGS: Lower chest: No acute abnormality. 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 Hepatobiliary: Hepatic parenchyma is normal. Multiple gallstones. The gallbladder is not distended. Biliary tree is not distended. Pancreas: Unremarkable. No pancreatic ductal dilatation or surrounding inflammatory changes. Spleen: Normal in size without focal abnormality.  Adrenals/Urinary Tract: Normal adrenal glands. Multiple bilateral renal calculi, more on the right the left. Bilateral renal cysts, the largest on the left upper pole measuring 2.8 cm in the  largest on the right being on the lower pole measuring 3.6 cm. No hydronephrosis. Bladder is normal. Stomach/Bowel: Stomach is within normal limits. Appendix appears normal. No evidence of bowel wall thickening, distention, or inflammatory changes. Vascular/Lymphatic: Aortic atherosclerosis. No enlarged abdominal or pelvic lymph nodes. Reproductive: Prostate is unremarkable. Other: No abdominal wall hernia or abnormality. No abdominopelvic ascites. Musculoskeletal: Extensive degenerative changes of the spine. Previous posterior decompression at T10-11 gas in the soft tissues posterior to the thecal sac secondary to recent surgery on 04/26/2020. IMPRESSION: 1. No acute abnormalities of the abdomen or pelvis. Postsurgical changes at T10-11 secondary to recent surgery. 2. Cholelithiasis. 3. Multiple bilateral renal calculi. 4. Aortic atherosclerosis. Aortic Atherosclerosis (ICD10-I70.0). Electronically Signed   By: Lorriane Shire M.D.   On: 05/11/2020 20:32   CT Chest High Resolution  Result Date: 05/09/2020 CLINICAL DATA:  79 year old male with history of interstitial lung disease. EXAM: CT CHEST WITHOUT CONTRAST TECHNIQUE: Multidetector CT imaging of the chest was performed following the standard protocol without intravenous contrast. High resolution imaging of the lungs, as well as inspiratory and expiratory imaging, was performed. COMPARISON:  No priors. FINDINGS: Cardiovascular: Heart size is normal. There is no significant pericardial fluid, thickening or pericardial calcification. There is aortic atherosclerosis, as well as atherosclerosis of the great vessels of the mediastinum and the coronary arteries, including calcified atherosclerotic plaque in the left main, left anterior descending, left circumflex and right  coronary arteries. Calcifications of the aortic valve and mitral annulus. Mediastinum/Nodes: No pathologically enlarged mediastinal or hilar lymph nodes. Please note that accurate exclusion of hilar adenopathy is limited on noncontrast CT scans. Feeding tube present extending into the stomach. Esophagus is otherwise unremarkable in appearance. No axillary lymphadenopathy. Lungs/Pleura: High-resolution images demonstrate widespread areas of peripheral predominant ground-glass attenuation, septal thickening and subpleural reticulation in the lungs bilaterally. These findings have a mild craniocaudal gradient. No traction bronchiectasis or frank honeycombing. Inspiratory and expiratory imaging is unremarkable. No acute consolidative airspace disease. No pleural effusions. No definite suspicious appearing pulmonary nodules or masses are noted. Upper Abdomen: Several faintly calcified small are subcentimeter gallstones lying dependently in the gallbladder. Musculoskeletal: There are no aggressive appearing lytic or blastic lesions noted in the visualized portions of the skeleton. IMPRESSION: 1. The appearance of the lungs is compatible with interstitial lung disease, but the spectrum of findings is considered indeterminate for usual interstitial pneumonia (UIP) per current ats guidelines. Repeat high-resolution chest CT is suggested in 12 months to assess for temporal changes in the appearance of the lung parenchyma. 2. Aortic atherosclerosis, in addition to left main and 3 vessel coronary artery disease. Assessment for potential risk factor modification, dietary therapy or pharmacologic therapy may be warranted, if clinically indicated. 3. There are calcifications of the aortic valve and mitral annulus. Echocardiographic correlation for evaluation of potential valvular dysfunction may be warranted if clinically indicated. 4. Cholelithiasis without evidence of acute cholecystitis at this time. Aortic Atherosclerosis  (ICD10-I70.0). Electronically Signed   By: Vinnie Langton M.D.   On: 05/09/2020 08:10   DG Swallowing Func-Speech Pathology  Result Date: 05/15/2020 Objective Swallowing Evaluation: Type of Study: MBS-Modified Barium Swallow Study  Patient Details Name: JOSMAR MESSIMER MRN: [id number] Date of Birth: July 04, 1941 Today's Date: 05/15/2020 Past Medical History: Past Medical History: Diagnosis Date  Arthritis   knees  Diabetes mellitus without complication ([place])   type II  Dysrhythmia   History of kidney stones   lithotripsy  Hypertension   Weakness of both legs 04/24/2020 Past Surgical History:  Past Surgical History: Procedure Laterality Date  ANTERIOR CERVICAL DECOMP/DISCECTOMY FUSION N/A 04/26/2020  Procedure: Cervical Three-Four anterior cervical decompression/discectomy/fusion with plate;  Surgeon: Eustace Moore, MD;  Location: [facility];  Service: Neurosurgery;  Laterality: N/A;  anterior, Part-1  EYE SURGERY Bilateral   cataracts removed= IOL  LUMBAR LAMINECTOMY/DECOMPRESSION MICRODISCECTOMY N/A 04/26/2020  Procedure: Thoracic Ten-Eleven Laminectomy;  Surgeon: Eustace Moore, MD;  Location: [facility];  Service: Neurosurgery;  Laterality: N/A;  Posterior, Part-2  TONSILLECTOMY    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 Suspect pharyngeal s/p surgical edema and reduced pharyngeal space from [place]rak continues negatively impact epiglottic inversion, resulting in incomplete laryngeal vestibule closure and  decreased pharyngeal wall contraction. As a result, mod-severe vallecular residue is present, primarily with puree and soft solids. During trial of puree, PO also began to coat the underside of pt's epiglottis and trace penetration noted during subsequent swallow, in addition to the residue that could not be effectively cleared with additional dry swallows. Very minimal vallecular residue noted with thin barium trials (improved since last study). Pt's swallow initiation was timely, and although thin barium via cup and straw intermittently penetrated into the laryngeal vestibule during the swallow, no aspiration events were observed and penetration stayed above the level of the vocal folds in >90% of trials. Due to cognitive impairments, pt unable to follow directions to consume pill whole with thin (he masticated pill), however he deglutated partially masticated pill with very large sip of thin barium without airway intrusion. Of note, pt exhibited throat clearing and reports a discomfort during intake, particularly with puree and soft solids, which suspect is due to edema. Would recommend that pt initiate a thin liquid diet (may have ensures/boosts) with intermittent throat clear and extra dry swallow as compensatory strategies. Medications may be administered whole with thin and ST will continue to provide skilled interventions to work toward diet advancement. Pt would benefit from repeat MBSS prior to solid texture advancement. SLP Visit Diagnosis Dysphagia, pharyngeal phase (R13.13) Attention and concentration deficit following -- Frontal lobe and executive function deficit following -- Impact on safety and function Moderate aspiration risk   CHL IP TREATMENT RECOMMENDATION 05/09/2020 Treatment Recommendations Therapy as outlined in treatment plan below   Prognosis 05/09/2020 Prognosis for Safe Diet Advancement -- Barriers to Reach Goals Cognitive deficits Barriers/Prognosis Comment -- CHL IP DIET RECOMMENDATION  05/15/2020 SLP Diet Recommendations Thin liquid;No solids, see liquids Liquid Administration via Cup;Straw Medication Administration Via alternative means Compensations Clear throat after each swallow;Multiple dry swallows after each bite/sip Postural Changes Remain semi-upright after after feeds/meals (Comment)   CHL IP OTHER RECOMMENDATIONS 05/15/2020 Recommended Consults -- Oral Care Recommendations Oral care BID Other Recommendations --   CHL IP FOLLOW UP RECOMMENDATIONS 05/09/2020 Follow up Recommendations Inpatient Rehab   No flowsheet data found.     CHL IP ORAL PHASE 05/15/2020 Oral Phase WFL Oral - Pudding Teaspoon -- Oral - Pudding Cup -- Oral - Honey Teaspoon -- Oral - Honey Cup -- Oral - Nectar Teaspoon NT Oral - Nectar Cup NT  Oral - Nectar Straw NT Oral - Thin Teaspoon NT Oral - Thin Cup Holding of bolus Oral - Thin Straw WFL Oral - Puree WFL Oral - Mech Soft WFL Oral - Regular NT Oral - Multi-Consistency NT Oral - Pill Other (Comment) Oral Phase - Comment --  CHL IP PHARYNGEAL PHASE 05/15/2020 Pharyngeal Phase Impaired Pharyngeal- Pudding Teaspoon -- Pharyngeal -- Pharyngeal- Pudding Cup -- Pharyngeal -- Pharyngeal- Honey Teaspoon -- Pharyngeal -- Pharyngeal- Honey Cup -- Pharyngeal -- Pharyngeal- Nectar Teaspoon -- Pharyngeal -- Pharyngeal- Nectar Cup NT Pharyngeal -- Pharyngeal- Nectar Straw NT Pharyngeal -- Pharyngeal- Thin Teaspoon NT Pharyngeal -- Pharyngeal- Thin Cup Reduced epiglottic inversion;Penetration/Aspiration during swallow;Pharyngeal residue - valleculae;Reduced airway/laryngeal closure Pharyngeal Material enters airway, remains ABOVE vocal cords and not ejected out Pharyngeal- Thin Straw Reduced epiglottic inversion;Reduced airway/laryngeal closure;Penetration/Aspiration during swallow;Pharyngeal residue - valleculae Pharyngeal Material enters airway, CONTACTS cords and not ejected out Pharyngeal- Puree Reduced epiglottic inversion;Reduced airway/laryngeal closure;Penetration/Aspiration  during swallow;Delayed swallow initiation-vallecula;Reduced pharyngeal peristalsis Pharyngeal Material enters airway, remains ABOVE vocal cords and not ejected out Pharyngeal- Mechanical Soft -- Pharyngeal -- Pharyngeal- Regular -- Pharyngeal -- Pharyngeal- Multi-consistency NT Pharyngeal -- Pharyngeal- Pill -- Pharyngeal -- Pharyngeal Comment --  CHL IP CERVICAL ESOPHAGEAL PHASE 05/15/2020 Cervical Esophageal Phase WFL Pudding Teaspoon -- Pudding Cup -- Honey Teaspoon -- Honey Cup -- Nectar Teaspoon -- Nectar Cup -- Nectar Straw -- Thin Teaspoon -- Thin Cup -- Thin Straw -- Puree -- Mechanical Soft -- Regular -- Multi-consistency -- Pill -- Cervical Esophageal Comment -- [name] 05/15/2020, 3:25 PM           ECHOCARDIOGRAM COMPLETE  Result Date: 05/01/2020    ECHOCARDIOGRAM REPORT   Patient Name:   REHMAN LEVINSON Date of Exam: 05/01/2020 Medical Rec #:  [id number]        Height:       72.0 in Accession #:    [id number]       Weight:       203.9 lb Date of Birth:  1941/05/08       BSA:          2.148 m Patient Age:    23 years         BP:           153/85 mmHg Patient Gender: M                HR:           81 bpm. Exam Location:  Inpatient Procedure: 2D Echo, Color Doppler and Cardiac Doppler Indications:    Bacteremia  History:        Patient has no prior history of Echocardiogram examinations.                 Risk Factors:Hypertension and Diabetes.  Sonographer:    Raquel Sarna Senior RDCS Referring Phys: [place]  1. Left ventricular ejection fraction, by estimation, is 55 to 60%. The left ventricle has normal function. The left ventricle has no regional wall motion abnormalities. There is moderate asymmetric left ventricular hypertrophy of the basal-septal segment. Left ventricular diastolic parameters were normal.  2. Right ventricular systolic function is normal. The right ventricular size is normal. Tricuspid regurgitation signal is inadequate for assessing PA pressure.  3. The mitral  valve is degenerative. No evidence of mitral valve regurgitation.  4. The aortic valve is tricuspid. Aortic valve regurgitation is not visualized. Mild aortic valve sclerosis is present, with no evidence of aortic valve stenosis.  5. Aortic dilatation noted. There is borderline dilatation of the aortic root measuring 39 mm.  6. The inferior vena cava is normal in size with greater than 50% respiratory variability, suggesting right atrial pressure of 3 mmHg.  7. Degenerative mitral valve. No clear vegetation seen. If clinical suspicion for endocarditis, consider TEE FINDINGS  Left Ventricle: Left ventricular ejection fraction, by estimation, is 55 to 60%. The left ventricle has normal function. The left ventricle has no regional wall motion abnormalities. The left ventricular internal cavity size was normal in size. There is  moderate asymmetric left ventricular hypertrophy of the basal-septal segment. Left ventricular diastolic parameters were normal. Right Ventricle: The right ventricular size is normal. Right vetricular wall thickness was not assessed. Right ventricular systolic function is normal. Tricuspid regurgitation signal is inadequate for assessing PA pressure. Left Atrium: Left atrial size was normal in size. Right Atrium: Right atrial size was normal in size. Pericardium: There is no evidence of pericardial effusion. Presence of pericardial fat pad. Mitral Valve: The mitral valve is degenerative in appearance. Moderate mitral annular calcification. No evidence of mitral valve regurgitation. Tricuspid Valve: The tricuspid valve is normal in structure. Tricuspid valve regurgitation is not demonstrated. Aortic Valve: The aortic valve is tricuspid. Aortic valve regurgitation is not visualized. Mild aortic valve sclerosis is present, with no evidence of aortic valve stenosis. Pulmonic Valve: The pulmonic valve was not well visualized. Pulmonic valve regurgitation is not visualized. Aorta: Aortic dilatation  noted. There is borderline dilatation of the aortic root measuring 39 mm. Venous: The inferior vena cava is normal in size with greater than 50% respiratory variability, suggesting right atrial pressure of 3 mmHg. IAS/Shunts: The interatrial septum was not well visualized.  LEFT VENTRICLE PLAX 2D LVIDd:         4.80 cm  Diastology LVIDs:         3.40 cm  LV e' lateral:   10.60 cm/s LV PW:         0.90 cm  LV E/e' lateral: 6.6 LV IVS:        1.20 cm  LV e' medial:    5.77 cm/s LVOT diam:     2.50 cm  LV E/e' medial:  12.2 LV SV:         74 LV SV Index:   35 LVOT Area:     4.91 cm  LEFT ATRIUM             Index       RIGHT ATRIUM           Index LA diam:        3.20 cm 1.49 cm/m  RA Area:     18.50 cm LA Vol (A2C):   63.1 ml 29.37 ml/m RA Volume:   39.80 ml  18.53 ml/m LA Vol (A4C):   47.5 ml 22.11 ml/m LA Biplane Vol: 55.3 ml 25.74 ml/m  AORTIC VALVE LVOT Vmax:   75.40 cm/s LVOT Vmean:  53.500 cm/s LVOT VTI:    0.151 m  AORTA Ao Root diam: 3.90 cm Ao Asc diam:  3.20 cm MITRAL VALVE MV Area (PHT): 2.91 cm    SHUNTS MV Decel Time: 261 msec    Systemic VTI:  0.15 m MV E velocity: 70.40 cm/s  Systemic Diam: 2.50 cm MV A velocity: 64.30 cm/s MV E/A ratio:  1.09 Oswaldo Milian MD Electronically signed by Oswaldo Milian MD Signature Date/Time: 05/01/2020/4:56:16 PM    Final     Labs:  Basic Metabolic  Panel: BMP Latest Ref Rng & Units 05/14/2020 05/11/2020 05/07/2020  Glucose 70 - 99 mg/dL 108(H) 295(H) 219(H)  BUN 8 - 23 mg/dL 25(H) 30(H) 28(H)  Creatinine 0.61 - 1.24 mg/dL 0.96 0.97 0.93  Sodium 135 - 145 mmol/L 139 137 140  Potassium 3.5 - 5.1 mmol/L 4.1 4.7 4.6  Chloride 98 - 111 mmol/L 103 99 102  CO2 22 - 32 mmol/L 28 29 30   Calcium 8.9 - 10.3 mg/dL 8.7(L) 8.4(L) 8.8(L)    CBC: CBC Latest Ref Rng & Units 05/16/2020 05/14/2020 05/10/2020  WBC 4.0 - 10.5 K/uL 13.1(H) 15.5(H) 22.3(H)  Hemoglobin 13.0 - 17.0 g/dL 14.2 14.1 14.4  Hematocrit 39 - 52 % 43.8 43.9 43.6  Platelets 150 - 400 K/uL  358 373 372    CBG: Recent Labs  Lab 05/17/20 1634 05/17/20 1954 05/18/20 0000 05/18/20 0452 05/18/20 0803  GLUCAP 186* 264* 174* 119* 146*    Brief HPI:   Johnathan Barker is a 79 y.o. male with history of HTN, T2DM, BLE weakness with difficulty walking for past couple of months and frequent falls.  He was admitted on 04/24/2020 with reports of fall couple days prior with BLE weakness and difficulty walking.  MRI brain was negative for acute changes.  He was found to have DDD with mild canal stenosis C3-C4 and T10-T11.  He was also found to have incidental 3.5 similar right thyroid nodule with recommendations of thyroid ultrasound for work-up.  Dr. Ronnald Ramp felt that patient's symptoms were due to myelopathy and recommended decompressive surgery stop progression of symptoms.  He was started on IV steroids and underwent ACDF C3-C4 and decompressive thoracic laminectomy-medial facetectomy.  Postop course significant for confusion disorientation as well as BLE spasms.  Therapy evaluations revealed functional decline and CIR was recommended for follow-up therapy   Hospital Course: TAREEK SABO was admitted to rehab 04/27/2020 for inpatient therapies to consist of PT, ST and OT at least three hours five days a week. Past admission physiatrist, therapy team and rehab RN have worked together to provide customized collaborative inpatient rehab.  At admission patient was noted to have significant back voice with difficulty managing oral secretions.  He was found to have aspiration pneumonia and with leukocytosis. Pharmacy consulted for input on antibiotic regimen and patient was started on IV cefepime and IV Flagyl due to concerns of sepsis. Speech therapy recommended n.p.o. with use of alternative method for nutritional supplement. He was started on IV decadron to help decrease post op cervical edema  Blood cultures were positive for staph hominis therefore flagyl was d/c on 06/09 and he was treated with 10  days of antibiotic therapy for possible bacteremia and PNA.Marland Kitchen    Repeat blood cultures showed no growth.  Echo done revealing EF of 55 to 60% and no vegetation noted.  His white count remains elevated 21.1 with repeat chest x-ray on 06/15 showing developing RML infiltrate with question of aspiration.  Prior RLL effusion had resolved.  CT abdomen pelvis showed cholelithiasis and renal calculi no infection noted.  Pulmonary was consulted for input and recommended CT chest for work-up due to question of ILD.  Chest CT was negative for evolving pneumonia and showed peripheral interstitial changes consistent with pneumonitis versus ILD.  Procalcitonin was within normal limits.  ID felt that recurrent respiratory infections/pneumonitis were most likely cause of these changes and recommended strict swallow precautions as well as pulmonary hygiene autoimmune work-up was negative and no definite honeycombing noted.  Dr. Lamonte Sakai  felt that it would be reasonable for patient to follow-up with ILD clinic after discharge. 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  MRI brain was repeated for work-up and showed no acute changes.  Voiding function has improved and he is voiding without retention.   Neck incision is C/D/I and is healing well.  Swallow function has improved and he was advanced to full liquid diet prior to discharge.  He is tolerating this without any signs or symptoms of reaction.  Follow-up CBC shows leukocytosis is resolving and he has been afebrile during the stay.  His vocal quality is improving and he is managing oral secretions without difficulty.  Intermittent cough felt to be due to dysphagia.  His blood pressures were monitored on TID basis and has been stable.  Sleep disturbance has resolved with initiation of melatonin to help with sleep hygiene.  Abnormal LFTs are resolving.  His diabetes has been monitored with ac/hs CBG checks and SSI was  use prn for tighter BS control.  Lantus was titrated up to 14 units at bedtime with meal coverage while on tube feeds.  His tube feeds discontinued blood sugars were noted to be improving and wife advised to continue monitoring blood sugar closely after discharge.  Wife has been very supportive and has been present throughout his stay.  Patient will continue to receive further follow-up home health PT, OT and ST by [facility] after discharge   Rehab course: During patient's stay in rehab weekly team conferences were held to monitor patient's progress, set goals and discuss barriers to discharge. At admission, patient required mod assist with basic ADL task and max assist with mobility. He exhibited fluctuating levels of confusion throughout evaluation and was unable to to follow 1 and two-step commands consistently.  He required mod to max assist for basic tasks.  Speech intelligibility was affected by extremity wet vocal quality and he required cues to clear throat and increased vocal intensity.  He has had improvement in activity tolerance, balance, postural control as well as ability to compensate for deficits.  He is able to complete ADL tasks with supervision.  He requires supervision for transfers and close supervision to ambulate 200 feet with RW.  He is able to do navigate his wheelchair with supervision.  He requires min assist to supervision for basic and functional tasks.  MoCA score has improved to 20/30.  He has had improvement in swallow function and repeat MBS recommended in 1-2 weeks prior to upgrading diet. Family education has been completed with wife and son regarding all aspects of safety and cognition.  Discharge disposition: 01-Home or Self Care  Diet: Liquid diet with carb restrictions as able  Special Instructions: 1.  Family to assist with cognitive tasks. 2.  Will need follow-up thyroid ultrasound for work-up of right thyroid nodule. 3.  Continue to monitor blood  sugars AC/HS and follow-up with PCP for further adjustment. 4.  Can follow-up with [facility]D clinic after discharge.    Discharge Instructions     Ambulatory referral to Physical Medicine Rehab   Complete by: As directed    1-2 TC appt      Allergies as of 05/18/2020       Reactions   Oxycodone    Ineffective/disorientation   Penicillins Hives, Rash, Other (See Comments)   FEVER PATIENT HAS HAD A PCN REACTION WITH IMMEDIATE RASH, FACIAL/TONGUE/THROAT SWELLING, SOB, OR LIGHTHEADEDNESS WITH HYPOTENSION:  #  #  #  YES  #  #  #   Has patient had a PCN reaction causing severe rash involving mucus membranes or skin necrosis: No Has patient had a PCN reaction that required hospitalization: No Has patient had a PCN reaction occurring within the last 10 years: No If all of the above answers are "NO", then may proceed with Cephalosporin use.        Medication List     STOP taking these medications    aspirin 325 MG EC tablet   FISH OIL PO   glimepiride 4 MG tablet Commonly known as: AMARYL   methocarbamol 500 MG tablet Commonly known as: Robaxin   oxyCODONE-acetaminophen 5-325 MG tablet Commonly known as: Percocet       TAKE these medications    amLODipine 5 MG tablet Commonly known as: NORVASC Take 1 tablet (5 mg total) by mouth at bedtime.   atorvastatin 10 MG tablet Commonly known as: LIPITOR Take 10 mg by mouth daily.   Basaglar KwikPen 100 UNIT/ML Inject 0.14 mLs (14 Units total) into the skin at bedtime. What changed: how much to take   famotidine 10 MG tablet Commonly known as: PEPCID Take 1 tablet (10 mg total) by mouth 2 (two) times daily.   Melatonin 300 MCG Tabs Take 0.5 tablets (150 mcg total) by mouth at bedtime.   metoprolol succinate 50 MG 24 hr tablet Commonly known as: TOPROL-XL Take 1 tablet (50 mg total) by mouth 2 (two) times daily with a meal. Take with or immediately following a meal. What changed: when to take this   Muscle Rub  10-15 % Crea Apply 1 application topically 4 (four) times daily -  with meals and at bedtime.   olmesartan 40 MG tablet Commonly known as: BENICAR Take 40 mg by mouth daily.   pantoprazole 40 MG tablet Commonly known as: Protonix Take 1 tablet (40 mg total) by mouth 2 (two) times daily.        Follow-up Information     Jamse Arn, MD Follow up.   Specialty: Physical Medicine and Rehabilitation Why: office will call you with follow up appt Contact information: [address] [place] [address] [phone]         Eustace Moore, MD. Call.   Specialty: Neurosurgery Why: For follow up appointment Contact information: [address] [place] [address] [phone]         Nicoletta Dress, MD. Call on 05/21/2020.   Specialty: Internal Medicine Why: for post hospital follow up Contact information: [place] [address] [phone]                 Signed: Bary Leriche 05/21/2020, 5:05 PM Patient was seen, face-face, and physical exam performed by me on day of discharge, greater than 30 minutes of total time spent.. Please see progress note from day of discharge as well.  Delice Lesch, MD, ABPMR

## 2020-05-23 ENCOUNTER — Telehealth: Payer: Self-pay

## 2020-05-23 NOTE — Telephone Encounter (Signed)
Sw received call from [facility] physical therapist out at patients home. Informed sw that the branch currently has no ST and family was concerned. PT called [facility] for family and [facility] has ST available at their branch. Sw faxed referral over for patient. Will follow up with family today.

## 2020-05-25 DIAGNOSIS — Z794 Long term (current) use of insulin: Secondary | ICD-10-CM | POA: Diagnosis not present

## 2020-05-25 DIAGNOSIS — I10 Essential (primary) hypertension: Secondary | ICD-10-CM | POA: Diagnosis not present

## 2020-05-25 DIAGNOSIS — R296 Repeated falls: Secondary | ICD-10-CM | POA: Diagnosis not present

## 2020-05-25 DIAGNOSIS — E1165 Type 2 diabetes mellitus with hyperglycemia: Secondary | ICD-10-CM | POA: Diagnosis not present

## 2020-05-25 DIAGNOSIS — Z4789 Encounter for other orthopedic aftercare: Secondary | ICD-10-CM | POA: Diagnosis not present

## 2020-05-25 DIAGNOSIS — R1310 Dysphagia, unspecified: Secondary | ICD-10-CM | POA: Diagnosis not present

## 2020-05-25 DIAGNOSIS — M5030 Other cervical disc degeneration, unspecified cervical region: Secondary | ICD-10-CM | POA: Diagnosis not present

## 2020-05-25 DIAGNOSIS — M4802 Spinal stenosis, cervical region: Secondary | ICD-10-CM | POA: Diagnosis not present

## 2020-05-25 DIAGNOSIS — R339 Retention of urine, unspecified: Secondary | ICD-10-CM | POA: Diagnosis not present

## 2020-05-30 ENCOUNTER — Encounter: Payer: Medicare Other | Admitting: Registered Nurse

## 2020-05-30 DIAGNOSIS — M4802 Spinal stenosis, cervical region: Secondary | ICD-10-CM | POA: Diagnosis not present

## 2020-05-30 DIAGNOSIS — E1165 Type 2 diabetes mellitus with hyperglycemia: Secondary | ICD-10-CM | POA: Diagnosis not present

## 2020-05-30 DIAGNOSIS — R1310 Dysphagia, unspecified: Secondary | ICD-10-CM | POA: Diagnosis not present

## 2020-05-30 DIAGNOSIS — M5030 Other cervical disc degeneration, unspecified cervical region: Secondary | ICD-10-CM | POA: Diagnosis not present

## 2020-05-30 DIAGNOSIS — Z4789 Encounter for other orthopedic aftercare: Secondary | ICD-10-CM | POA: Diagnosis not present

## 2020-05-30 DIAGNOSIS — I10 Essential (primary) hypertension: Secondary | ICD-10-CM | POA: Diagnosis not present

## 2020-06-01 DIAGNOSIS — Z4789 Encounter for other orthopedic aftercare: Secondary | ICD-10-CM | POA: Diagnosis not present

## 2020-06-01 DIAGNOSIS — R1310 Dysphagia, unspecified: Secondary | ICD-10-CM | POA: Diagnosis not present

## 2020-06-01 DIAGNOSIS — E1165 Type 2 diabetes mellitus with hyperglycemia: Secondary | ICD-10-CM | POA: Diagnosis not present

## 2020-06-01 DIAGNOSIS — M4802 Spinal stenosis, cervical region: Secondary | ICD-10-CM | POA: Diagnosis not present

## 2020-06-01 DIAGNOSIS — I10 Essential (primary) hypertension: Secondary | ICD-10-CM | POA: Diagnosis not present

## 2020-06-01 DIAGNOSIS — M5030 Other cervical disc degeneration, unspecified cervical region: Secondary | ICD-10-CM | POA: Diagnosis not present

## 2020-06-04 DIAGNOSIS — E1165 Type 2 diabetes mellitus with hyperglycemia: Secondary | ICD-10-CM | POA: Diagnosis not present

## 2020-06-04 DIAGNOSIS — R1310 Dysphagia, unspecified: Secondary | ICD-10-CM | POA: Diagnosis not present

## 2020-06-04 DIAGNOSIS — I10 Essential (primary) hypertension: Secondary | ICD-10-CM | POA: Diagnosis not present

## 2020-06-04 DIAGNOSIS — M4802 Spinal stenosis, cervical region: Secondary | ICD-10-CM | POA: Diagnosis not present

## 2020-06-04 DIAGNOSIS — Z4789 Encounter for other orthopedic aftercare: Secondary | ICD-10-CM | POA: Diagnosis not present

## 2020-06-04 DIAGNOSIS — M5030 Other cervical disc degeneration, unspecified cervical region: Secondary | ICD-10-CM | POA: Diagnosis not present

## 2020-06-06 DIAGNOSIS — K5900 Constipation, unspecified: Secondary | ICD-10-CM | POA: Diagnosis not present

## 2020-06-06 DIAGNOSIS — N390 Urinary tract infection, site not specified: Secondary | ICD-10-CM | POA: Diagnosis not present

## 2020-06-06 DIAGNOSIS — E1169 Type 2 diabetes mellitus with other specified complication: Secondary | ICD-10-CM | POA: Diagnosis not present

## 2020-06-06 DIAGNOSIS — E785 Hyperlipidemia, unspecified: Secondary | ICD-10-CM | POA: Diagnosis not present

## 2020-06-06 DIAGNOSIS — Z79899 Other long term (current) drug therapy: Secondary | ICD-10-CM | POA: Diagnosis not present

## 2020-06-06 DIAGNOSIS — R1310 Dysphagia, unspecified: Secondary | ICD-10-CM | POA: Diagnosis not present

## 2020-06-06 DIAGNOSIS — R3129 Other microscopic hematuria: Secondary | ICD-10-CM | POA: Diagnosis not present

## 2020-06-07 ENCOUNTER — Encounter: Payer: Self-pay | Admitting: Physical Medicine & Rehabilitation

## 2020-06-07 ENCOUNTER — Encounter: Payer: Medicare Other | Attending: Physical Medicine & Rehabilitation | Admitting: Physical Medicine & Rehabilitation

## 2020-06-07 ENCOUNTER — Other Ambulatory Visit: Payer: Self-pay

## 2020-06-07 VITALS — BP 100/64 | HR 72 | Temp 97.8°F | Ht 73.0 in | Wt 189.0 lb

## 2020-06-07 DIAGNOSIS — R269 Unspecified abnormalities of gait and mobility: Secondary | ICD-10-CM

## 2020-06-07 DIAGNOSIS — R4689 Other symptoms and signs involving appearance and behavior: Secondary | ICD-10-CM | POA: Insufficient documentation

## 2020-06-07 DIAGNOSIS — G959 Disease of spinal cord, unspecified: Secondary | ICD-10-CM | POA: Insufficient documentation

## 2020-06-07 DIAGNOSIS — G8918 Other acute postprocedural pain: Secondary | ICD-10-CM

## 2020-06-07 DIAGNOSIS — E041 Nontoxic single thyroid nodule: Secondary | ICD-10-CM

## 2020-06-07 DIAGNOSIS — R4189 Other symptoms and signs involving cognitive functions and awareness: Secondary | ICD-10-CM | POA: Insufficient documentation

## 2020-06-07 DIAGNOSIS — J849 Interstitial pulmonary disease, unspecified: Secondary | ICD-10-CM | POA: Insufficient documentation

## 2020-06-07 DIAGNOSIS — E1165 Type 2 diabetes mellitus with hyperglycemia: Secondary | ICD-10-CM | POA: Diagnosis not present

## 2020-06-07 DIAGNOSIS — I10 Essential (primary) hypertension: Secondary | ICD-10-CM | POA: Diagnosis not present

## 2020-06-07 DIAGNOSIS — R410 Disorientation, unspecified: Secondary | ICD-10-CM | POA: Diagnosis not present

## 2020-06-07 DIAGNOSIS — Z794 Long term (current) use of insulin: Secondary | ICD-10-CM | POA: Insufficient documentation

## 2020-06-07 DIAGNOSIS — R41841 Cognitive communication deficit: Secondary | ICD-10-CM | POA: Diagnosis not present

## 2020-06-07 NOTE — Patient Instructions (Addendum)
Please follow up with:  1. Follow up for U/S  2. ILD clinic [place]

## 2020-06-07 NOTE — Progress Notes (Signed)
Subjective:    Patient ID: Johnathan Barker, male    DOB: 1941-10-18, 79 y.o.   MRN: [id number]  HPI Male with history of HTN, T2DM, BLE weakness with difficulty walking presents for hospital follow up after receiving CIR for cervical/thoracic myelopathy s/p decompression.  Family supplements history.  At discharge, he was instructed to follow up for thyroid ultrasound. CBGs have been slightly elevated/relatively controlled overall. He has not seen Pulm.  He sees Neurosurg next week. He saw his PCP.  Cognition is improving. Pain is tolerable. BP has been controlled.  Bowel movement are normal for patient. He had MBS yesterday and is now on regular thins. Sleeping well. Denies falls.  Therapies: 2/week Mobility: Rolling walker at times DME: Previously owned  Pain Inventory Average Pain 5 Pain Right Now 5 My pain is dull and aching  In the last 24 hours, has pain interfered with the following? General activity 5 Relation with others 0 Enjoyment of life 2 What TIME of day is your pain at its worst? morning Sleep (in general) Good  Pain is worse with: bending Pain improves with: rest, heat/ice and medication Relief from Meds: 5  Mobility walk with assistance use a walker how many minutes can you walk? 20 ability to climb steps?  yes do you drive?  no  Function retired  Neuro/Psych bladder control problems  Prior Studies transitional  Physicians involved in your care transitional   Family History  Problem Relation Age of Onset  . CAD Neg Hx   . Diabetes Mellitus II Neg Hx    Social History   Socioeconomic History  . Marital status: Married    Spouse name: Not on file  . Number of children: Not on file  . Years of education: Not on file  . Highest education level: Not on file  Occupational History  . Not on file  Tobacco Use  . Smoking status: Never Smoker  . Smokeless tobacco: Never Used  Vaping Use  . Vaping Use: Never used  Substance and Sexual  Activity  . Alcohol use: No  . Drug use: No  . Sexual activity: Not on file  Other Topics Concern  . Not on file  Social History Narrative  . Not on file   Social Determinants of Health   Financial Resource Strain:   . Difficulty of Paying Living Expenses:   Food Insecurity:   . Worried About Charity fundraiser in the Last Year:   . Arboriculturist in the Last Year:   Transportation Needs:   . Film/video editor (Medical):   Marland Kitchen Lack of Transportation (Non-Medical):   Physical Activity:   . Days of Exercise per Week:   . Minutes of Exercise per Session:   Stress:   . Feeling of Stress :   Social Connections:   . Frequency of Communication with Friends and Family:   . Frequency of Social Gatherings with Friends and Family:   . Attends Religious Services:   . Active Member of Clubs or Organizations:   . Attends Archivist Meetings:   Marland Kitchen Marital Status:    Past Surgical History:  Procedure Laterality Date  . ANTERIOR CERVICAL DECOMP/DISCECTOMY FUSION N/A 04/26/2020   Procedure: Cervical Three-Four anterior cervical decompression/discectomy/fusion with plate;  Surgeon: Eustace Moore, MD;  Location: [facility];  Service: Neurosurgery;  Laterality: N/A;  anterior, Part-1  . EYE SURGERY Bilateral    cataracts removed= IOL  . LUMBAR LAMINECTOMY/DECOMPRESSION MICRODISCECTOMY N/A 04/26/2020  Procedure: Thoracic Ten-Eleven Laminectomy;  Surgeon: Eustace Moore, MD;  Location: [facility];  Service: Neurosurgery;  Laterality: N/A;  Posterior, Part-2  . TONSILLECTOMY    . 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 TOTAL KNEE ARTHROPLASTY Left 09/13/2018   Procedure: LEFT TOTAL KNEE ARTHROPLASTY;  Surgeon: Vickey Huger, MD;  Location: [facility];  Service: Orthopedics;  Laterality: Left;  with block   Past Medical History:  Diagnosis Date  . Arthritis     knees  . Diabetes mellitus without complication ([facility])    type II  . Dysrhythmia   . History of kidney stones    lithotripsy  . Hypertension   . Weakness of both legs 04/24/2020   BP 100/64   Pulse 72   Temp 97.8 F (36.6 C)   Ht 6\' 1"  (1.854 m)   Wt 189 lb (85.7 kg)   SpO2 97%   BMI 24.94 kg/m   Opioid Risk Score:   Fall Risk Score:  `1  Depression screen PHQ 2/9  Depression screen [facility] 2/9 06/07/2020 06/07/2020  Decreased Interest 0 0  Down, Depressed, Hopeless 1 1  PHQ - 2 Score 1 1  Altered sleeping 0 -  Tired, decreased energy 2 -  Change in appetite 0 -  Feeling bad or failure about yourself  0 -  Trouble concentrating 0 -  Moving slowly or fidgety/restless 1 -  Suicidal thoughts 0 -  PHQ-9 Score 4 -    Review of Systems  Constitutional: Positive for unexpected weight change.  HENT: Negative.   Eyes: Negative.   Respiratory: Negative.   Cardiovascular: Negative.   Gastrointestinal: Positive for diarrhea.  Endocrine: Negative.        High blood sugar  Genitourinary: Negative.   Musculoskeletal: Positive for gait problem and neck pain.  Skin: Negative.   Allergic/Immunologic: Negative.   Hematological: Negative.   Psychiatric/Behavioral: Negative.   All other systems reviewed and are negative.      Objective:   Physical Exam  Constitutional: No distress . Vital signs reviewed. HENT: Normocephalic.  Atraumatic. Eyes: EOMI. No discharge. Cardiovascular: No JVD. Respiratory: Normal effort.  No stridor. GI: Non-distended. Skin: Incision C/D/I Steristrips to back c/d/i Psych: Remain confused and slightly tangential, but improving Musc: No edema in extremities.  No tenderness in extremities. Neurological: Alert and oriented x3 Motor: 5/5 throughout, stable    Assessment & Plan:  Male with history of HTN, T2DM, BLE weakness with difficulty walking presents for hospital follow up after receiving CIR for cervical/thoracic myelopathy s/p  decompression.  1.  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  Cont therapies  Follow up with Neurosurg  2. Neck Pain   Cont Tylenol prn.   States PCP prescribed a medication, but does not recall what  3. HTN:   Controlled today  Cont meds  4.  T2DM with hyperglycemia:   Slightly elevated  Cont meds  Encouraged follow up with PCP  5. Constipation:   Cont meds  Regular for patient every ~2 days  6. Pharyngeal dysphagia: Resolved  Passed MBS    7. Gait abnormality  Cont rolling walker for safety  Cont therapies  8. Right thyroid nodule  Encouraged follow up for U/S  9. ILD  Encouraged follow up with ILD clinic

## 2020-06-08 DIAGNOSIS — M4802 Spinal stenosis, cervical region: Secondary | ICD-10-CM | POA: Diagnosis not present

## 2020-06-08 DIAGNOSIS — M5030 Other cervical disc degeneration, unspecified cervical region: Secondary | ICD-10-CM | POA: Diagnosis not present

## 2020-06-08 DIAGNOSIS — E1165 Type 2 diabetes mellitus with hyperglycemia: Secondary | ICD-10-CM | POA: Diagnosis not present

## 2020-06-08 DIAGNOSIS — I10 Essential (primary) hypertension: Secondary | ICD-10-CM | POA: Diagnosis not present

## 2020-06-08 DIAGNOSIS — R1310 Dysphagia, unspecified: Secondary | ICD-10-CM | POA: Diagnosis not present

## 2020-06-08 DIAGNOSIS — Z4789 Encounter for other orthopedic aftercare: Secondary | ICD-10-CM | POA: Diagnosis not present

## 2020-06-11 DIAGNOSIS — E1165 Type 2 diabetes mellitus with hyperglycemia: Secondary | ICD-10-CM | POA: Diagnosis not present

## 2020-06-11 DIAGNOSIS — I10 Essential (primary) hypertension: Secondary | ICD-10-CM | POA: Diagnosis not present

## 2020-06-11 DIAGNOSIS — M4802 Spinal stenosis, cervical region: Secondary | ICD-10-CM | POA: Diagnosis not present

## 2020-06-11 DIAGNOSIS — M5030 Other cervical disc degeneration, unspecified cervical region: Secondary | ICD-10-CM | POA: Diagnosis not present

## 2020-06-11 DIAGNOSIS — Z4789 Encounter for other orthopedic aftercare: Secondary | ICD-10-CM | POA: Diagnosis not present

## 2020-06-11 DIAGNOSIS — R1310 Dysphagia, unspecified: Secondary | ICD-10-CM | POA: Diagnosis not present

## 2020-06-14 DIAGNOSIS — M4802 Spinal stenosis, cervical region: Secondary | ICD-10-CM | POA: Diagnosis not present

## 2020-06-14 DIAGNOSIS — R1310 Dysphagia, unspecified: Secondary | ICD-10-CM | POA: Diagnosis not present

## 2020-06-14 DIAGNOSIS — M5030 Other cervical disc degeneration, unspecified cervical region: Secondary | ICD-10-CM | POA: Diagnosis not present

## 2020-06-14 DIAGNOSIS — Z4789 Encounter for other orthopedic aftercare: Secondary | ICD-10-CM | POA: Diagnosis not present

## 2020-06-14 DIAGNOSIS — E1165 Type 2 diabetes mellitus with hyperglycemia: Secondary | ICD-10-CM | POA: Diagnosis not present

## 2020-06-14 DIAGNOSIS — I10 Essential (primary) hypertension: Secondary | ICD-10-CM | POA: Diagnosis not present

## 2020-06-15 ENCOUNTER — Other Ambulatory Visit: Payer: Self-pay | Admitting: Physical Medicine and Rehabilitation

## 2020-06-15 DIAGNOSIS — E1165 Type 2 diabetes mellitus with hyperglycemia: Secondary | ICD-10-CM | POA: Diagnosis not present

## 2020-06-15 DIAGNOSIS — Z4789 Encounter for other orthopedic aftercare: Secondary | ICD-10-CM | POA: Diagnosis not present

## 2020-06-15 DIAGNOSIS — R1310 Dysphagia, unspecified: Secondary | ICD-10-CM | POA: Diagnosis not present

## 2020-06-15 DIAGNOSIS — I10 Essential (primary) hypertension: Secondary | ICD-10-CM | POA: Diagnosis not present

## 2020-06-15 DIAGNOSIS — M4802 Spinal stenosis, cervical region: Secondary | ICD-10-CM | POA: Diagnosis not present

## 2020-06-15 DIAGNOSIS — M5030 Other cervical disc degeneration, unspecified cervical region: Secondary | ICD-10-CM | POA: Diagnosis not present

## 2020-06-18 DIAGNOSIS — Z4789 Encounter for other orthopedic aftercare: Secondary | ICD-10-CM | POA: Diagnosis not present

## 2020-06-18 DIAGNOSIS — I10 Essential (primary) hypertension: Secondary | ICD-10-CM | POA: Diagnosis not present

## 2020-06-18 DIAGNOSIS — R1310 Dysphagia, unspecified: Secondary | ICD-10-CM | POA: Diagnosis not present

## 2020-06-18 DIAGNOSIS — E1165 Type 2 diabetes mellitus with hyperglycemia: Secondary | ICD-10-CM | POA: Diagnosis not present

## 2020-06-18 DIAGNOSIS — M5030 Other cervical disc degeneration, unspecified cervical region: Secondary | ICD-10-CM | POA: Diagnosis not present

## 2020-06-18 DIAGNOSIS — M4802 Spinal stenosis, cervical region: Secondary | ICD-10-CM | POA: Diagnosis not present

## 2020-06-20 DIAGNOSIS — I10 Essential (primary) hypertension: Secondary | ICD-10-CM | POA: Diagnosis not present

## 2020-06-20 DIAGNOSIS — M4802 Spinal stenosis, cervical region: Secondary | ICD-10-CM | POA: Diagnosis not present

## 2020-06-20 DIAGNOSIS — E1165 Type 2 diabetes mellitus with hyperglycemia: Secondary | ICD-10-CM | POA: Diagnosis not present

## 2020-06-20 DIAGNOSIS — Z4789 Encounter for other orthopedic aftercare: Secondary | ICD-10-CM | POA: Diagnosis not present

## 2020-06-20 DIAGNOSIS — R1310 Dysphagia, unspecified: Secondary | ICD-10-CM | POA: Diagnosis not present

## 2020-06-20 DIAGNOSIS — M5030 Other cervical disc degeneration, unspecified cervical region: Secondary | ICD-10-CM | POA: Diagnosis not present

## 2020-06-21 DIAGNOSIS — E1165 Type 2 diabetes mellitus with hyperglycemia: Secondary | ICD-10-CM | POA: Diagnosis not present

## 2020-06-21 DIAGNOSIS — M5030 Other cervical disc degeneration, unspecified cervical region: Secondary | ICD-10-CM | POA: Diagnosis not present

## 2020-06-21 DIAGNOSIS — Z4789 Encounter for other orthopedic aftercare: Secondary | ICD-10-CM | POA: Diagnosis not present

## 2020-06-21 DIAGNOSIS — M4802 Spinal stenosis, cervical region: Secondary | ICD-10-CM | POA: Diagnosis not present

## 2020-06-21 DIAGNOSIS — I10 Essential (primary) hypertension: Secondary | ICD-10-CM | POA: Diagnosis not present

## 2020-06-21 DIAGNOSIS — R1310 Dysphagia, unspecified: Secondary | ICD-10-CM | POA: Diagnosis not present

## 2020-06-24 DIAGNOSIS — M4802 Spinal stenosis, cervical region: Secondary | ICD-10-CM | POA: Diagnosis not present

## 2020-06-24 DIAGNOSIS — I10 Essential (primary) hypertension: Secondary | ICD-10-CM | POA: Diagnosis not present

## 2020-06-24 DIAGNOSIS — M5030 Other cervical disc degeneration, unspecified cervical region: Secondary | ICD-10-CM | POA: Diagnosis not present

## 2020-06-24 DIAGNOSIS — R296 Repeated falls: Secondary | ICD-10-CM | POA: Diagnosis not present

## 2020-06-24 DIAGNOSIS — R1310 Dysphagia, unspecified: Secondary | ICD-10-CM | POA: Diagnosis not present

## 2020-06-24 DIAGNOSIS — R339 Retention of urine, unspecified: Secondary | ICD-10-CM | POA: Diagnosis not present

## 2020-06-24 DIAGNOSIS — Z4789 Encounter for other orthopedic aftercare: Secondary | ICD-10-CM | POA: Diagnosis not present

## 2020-06-24 DIAGNOSIS — E1165 Type 2 diabetes mellitus with hyperglycemia: Secondary | ICD-10-CM | POA: Diagnosis not present

## 2020-06-24 DIAGNOSIS — Z794 Long term (current) use of insulin: Secondary | ICD-10-CM | POA: Diagnosis not present

## 2020-06-26 DIAGNOSIS — I10 Essential (primary) hypertension: Secondary | ICD-10-CM | POA: Diagnosis not present

## 2020-06-26 DIAGNOSIS — E1169 Type 2 diabetes mellitus with other specified complication: Secondary | ICD-10-CM | POA: Diagnosis not present

## 2020-06-26 DIAGNOSIS — E785 Hyperlipidemia, unspecified: Secondary | ICD-10-CM | POA: Diagnosis not present

## 2020-06-27 DIAGNOSIS — Z4789 Encounter for other orthopedic aftercare: Secondary | ICD-10-CM | POA: Diagnosis not present

## 2020-06-27 DIAGNOSIS — I10 Essential (primary) hypertension: Secondary | ICD-10-CM | POA: Diagnosis not present

## 2020-06-27 DIAGNOSIS — E1165 Type 2 diabetes mellitus with hyperglycemia: Secondary | ICD-10-CM | POA: Diagnosis not present

## 2020-06-27 DIAGNOSIS — M5030 Other cervical disc degeneration, unspecified cervical region: Secondary | ICD-10-CM | POA: Diagnosis not present

## 2020-06-27 DIAGNOSIS — M4802 Spinal stenosis, cervical region: Secondary | ICD-10-CM | POA: Diagnosis not present

## 2020-06-27 DIAGNOSIS — R1310 Dysphagia, unspecified: Secondary | ICD-10-CM | POA: Diagnosis not present

## 2020-06-27 DIAGNOSIS — E1169 Type 2 diabetes mellitus with other specified complication: Secondary | ICD-10-CM | POA: Diagnosis not present

## 2020-07-03 DIAGNOSIS — I10 Essential (primary) hypertension: Secondary | ICD-10-CM | POA: Diagnosis not present

## 2020-07-03 DIAGNOSIS — M5030 Other cervical disc degeneration, unspecified cervical region: Secondary | ICD-10-CM | POA: Diagnosis not present

## 2020-07-03 DIAGNOSIS — M4802 Spinal stenosis, cervical region: Secondary | ICD-10-CM | POA: Diagnosis not present

## 2020-07-03 DIAGNOSIS — R1310 Dysphagia, unspecified: Secondary | ICD-10-CM | POA: Diagnosis not present

## 2020-07-03 DIAGNOSIS — E1165 Type 2 diabetes mellitus with hyperglycemia: Secondary | ICD-10-CM | POA: Diagnosis not present

## 2020-07-03 DIAGNOSIS — Z4789 Encounter for other orthopedic aftercare: Secondary | ICD-10-CM | POA: Diagnosis not present

## 2020-07-10 DIAGNOSIS — I10 Essential (primary) hypertension: Secondary | ICD-10-CM | POA: Diagnosis not present

## 2020-07-10 DIAGNOSIS — M542 Cervicalgia: Secondary | ICD-10-CM | POA: Diagnosis not present

## 2020-07-10 DIAGNOSIS — R252 Cramp and spasm: Secondary | ICD-10-CM | POA: Diagnosis not present

## 2020-07-10 DIAGNOSIS — G959 Disease of spinal cord, unspecified: Secondary | ICD-10-CM | POA: Diagnosis not present

## 2020-07-10 DIAGNOSIS — Z6832 Body mass index (BMI) 32.0-32.9, adult: Secondary | ICD-10-CM | POA: Diagnosis not present

## 2020-07-11 DIAGNOSIS — M5030 Other cervical disc degeneration, unspecified cervical region: Secondary | ICD-10-CM | POA: Diagnosis not present

## 2020-07-11 DIAGNOSIS — R1310 Dysphagia, unspecified: Secondary | ICD-10-CM | POA: Diagnosis not present

## 2020-07-11 DIAGNOSIS — I10 Essential (primary) hypertension: Secondary | ICD-10-CM | POA: Diagnosis not present

## 2020-07-11 DIAGNOSIS — Z4789 Encounter for other orthopedic aftercare: Secondary | ICD-10-CM | POA: Diagnosis not present

## 2020-07-11 DIAGNOSIS — M4802 Spinal stenosis, cervical region: Secondary | ICD-10-CM | POA: Diagnosis not present

## 2020-07-11 DIAGNOSIS — E1165 Type 2 diabetes mellitus with hyperglycemia: Secondary | ICD-10-CM | POA: Diagnosis not present

## 2020-07-19 ENCOUNTER — Encounter: Payer: Medicare Other | Admitting: Physical Medicine & Rehabilitation

## 2020-07-20 DIAGNOSIS — I10 Essential (primary) hypertension: Secondary | ICD-10-CM | POA: Diagnosis not present

## 2020-07-20 DIAGNOSIS — M4802 Spinal stenosis, cervical region: Secondary | ICD-10-CM | POA: Diagnosis not present

## 2020-07-20 DIAGNOSIS — Z4789 Encounter for other orthopedic aftercare: Secondary | ICD-10-CM | POA: Diagnosis not present

## 2020-07-20 DIAGNOSIS — M5030 Other cervical disc degeneration, unspecified cervical region: Secondary | ICD-10-CM | POA: Diagnosis not present

## 2020-07-20 DIAGNOSIS — R1310 Dysphagia, unspecified: Secondary | ICD-10-CM | POA: Diagnosis not present

## 2020-07-20 DIAGNOSIS — E1165 Type 2 diabetes mellitus with hyperglycemia: Secondary | ICD-10-CM | POA: Diagnosis not present

## 2020-08-07 DIAGNOSIS — E119 Type 2 diabetes mellitus without complications: Secondary | ICD-10-CM | POA: Diagnosis not present

## 2020-08-07 DIAGNOSIS — B351 Tinea unguium: Secondary | ICD-10-CM | POA: Diagnosis not present

## 2020-08-07 DIAGNOSIS — Z7984 Long term (current) use of oral hypoglycemic drugs: Secondary | ICD-10-CM | POA: Diagnosis not present

## 2020-09-18 DIAGNOSIS — G959 Disease of spinal cord, unspecified: Secondary | ICD-10-CM | POA: Diagnosis not present

## 2020-09-26 DIAGNOSIS — E785 Hyperlipidemia, unspecified: Secondary | ICD-10-CM | POA: Diagnosis not present

## 2020-09-26 DIAGNOSIS — Z125 Encounter for screening for malignant neoplasm of prostate: Secondary | ICD-10-CM | POA: Diagnosis not present

## 2020-09-26 DIAGNOSIS — I10 Essential (primary) hypertension: Secondary | ICD-10-CM | POA: Diagnosis not present

## 2020-09-26 DIAGNOSIS — Z23 Encounter for immunization: Secondary | ICD-10-CM | POA: Diagnosis not present

## 2020-09-26 DIAGNOSIS — E1169 Type 2 diabetes mellitus with other specified complication: Secondary | ICD-10-CM | POA: Diagnosis not present

## 2020-09-27 ENCOUNTER — Ambulatory Visit: Payer: Medicare Other | Admitting: Neurology

## 2020-11-05 ENCOUNTER — Other Ambulatory Visit: Payer: Self-pay | Admitting: Physical Medicine and Rehabilitation

## 2020-12-25 DIAGNOSIS — Z0000 Encounter for general adult medical examination without abnormal findings: Secondary | ICD-10-CM | POA: Diagnosis not present

## 2020-12-25 DIAGNOSIS — E785 Hyperlipidemia, unspecified: Secondary | ICD-10-CM | POA: Diagnosis not present

## 2020-12-25 DIAGNOSIS — Z1331 Encounter for screening for depression: Secondary | ICD-10-CM | POA: Diagnosis not present

## 2020-12-25 DIAGNOSIS — Z9181 History of falling: Secondary | ICD-10-CM | POA: Diagnosis not present

## 2020-12-27 DIAGNOSIS — E785 Hyperlipidemia, unspecified: Secondary | ICD-10-CM | POA: Diagnosis not present

## 2020-12-27 DIAGNOSIS — E11319 Type 2 diabetes mellitus with unspecified diabetic retinopathy without macular edema: Secondary | ICD-10-CM | POA: Diagnosis not present

## 2020-12-27 DIAGNOSIS — I10 Essential (primary) hypertension: Secondary | ICD-10-CM | POA: Diagnosis not present

## 2020-12-27 DIAGNOSIS — E1169 Type 2 diabetes mellitus with other specified complication: Secondary | ICD-10-CM | POA: Diagnosis not present

## 2020-12-27 DIAGNOSIS — Z794 Long term (current) use of insulin: Secondary | ICD-10-CM | POA: Diagnosis not present

## 2021-01-03 ENCOUNTER — Other Ambulatory Visit: Payer: Self-pay | Admitting: Physical Medicine and Rehabilitation

## 2021-01-11 DIAGNOSIS — E113293 Type 2 diabetes mellitus with mild nonproliferative diabetic retinopathy without macular edema, bilateral: Secondary | ICD-10-CM | POA: Diagnosis not present

## 2021-02-14 DIAGNOSIS — E119 Type 2 diabetes mellitus without complications: Secondary | ICD-10-CM | POA: Diagnosis not present

## 2021-02-14 DIAGNOSIS — M19072 Primary osteoarthritis, left ankle and foot: Secondary | ICD-10-CM | POA: Diagnosis not present

## 2021-02-14 DIAGNOSIS — M19071 Primary osteoarthritis, right ankle and foot: Secondary | ICD-10-CM | POA: Diagnosis not present

## 2021-02-14 DIAGNOSIS — B351 Tinea unguium: Secondary | ICD-10-CM | POA: Diagnosis not present

## 2021-03-13 DIAGNOSIS — R42 Dizziness and giddiness: Secondary | ICD-10-CM | POA: Diagnosis not present

## 2021-03-13 DIAGNOSIS — I10 Essential (primary) hypertension: Secondary | ICD-10-CM | POA: Diagnosis not present

## 2021-03-27 DIAGNOSIS — E1169 Type 2 diabetes mellitus with other specified complication: Secondary | ICD-10-CM | POA: Diagnosis not present

## 2021-03-27 DIAGNOSIS — E785 Hyperlipidemia, unspecified: Secondary | ICD-10-CM | POA: Diagnosis not present

## 2021-03-27 DIAGNOSIS — Z139 Encounter for screening, unspecified: Secondary | ICD-10-CM | POA: Diagnosis not present

## 2021-03-27 DIAGNOSIS — E11319 Type 2 diabetes mellitus with unspecified diabetic retinopathy without macular edema: Secondary | ICD-10-CM | POA: Diagnosis not present

## 2021-03-27 DIAGNOSIS — I10 Essential (primary) hypertension: Secondary | ICD-10-CM | POA: Diagnosis not present

## 2021-05-06 DIAGNOSIS — H8113 Benign paroxysmal vertigo, bilateral: Secondary | ICD-10-CM | POA: Diagnosis not present

## 2021-05-06 DIAGNOSIS — I10 Essential (primary) hypertension: Secondary | ICD-10-CM | POA: Diagnosis not present

## 2021-05-06 DIAGNOSIS — H6123 Impacted cerumen, bilateral: Secondary | ICD-10-CM | POA: Diagnosis not present

## 2021-05-14 DIAGNOSIS — R42 Dizziness and giddiness: Secondary | ICD-10-CM | POA: Diagnosis not present

## 2021-05-14 DIAGNOSIS — R262 Difficulty in walking, not elsewhere classified: Secondary | ICD-10-CM | POA: Diagnosis not present

## 2021-05-15 DIAGNOSIS — R42 Dizziness and giddiness: Secondary | ICD-10-CM | POA: Diagnosis not present

## 2021-05-15 DIAGNOSIS — R262 Difficulty in walking, not elsewhere classified: Secondary | ICD-10-CM | POA: Diagnosis not present

## 2021-05-21 DIAGNOSIS — R262 Difficulty in walking, not elsewhere classified: Secondary | ICD-10-CM | POA: Diagnosis not present

## 2021-05-21 DIAGNOSIS — R42 Dizziness and giddiness: Secondary | ICD-10-CM | POA: Diagnosis not present

## 2021-05-23 DIAGNOSIS — R262 Difficulty in walking, not elsewhere classified: Secondary | ICD-10-CM | POA: Diagnosis not present

## 2021-05-23 DIAGNOSIS — R42 Dizziness and giddiness: Secondary | ICD-10-CM | POA: Diagnosis not present

## 2021-05-28 DIAGNOSIS — R262 Difficulty in walking, not elsewhere classified: Secondary | ICD-10-CM | POA: Diagnosis not present

## 2021-05-28 DIAGNOSIS — R42 Dizziness and giddiness: Secondary | ICD-10-CM | POA: Diagnosis not present

## 2021-05-30 DIAGNOSIS — R42 Dizziness and giddiness: Secondary | ICD-10-CM | POA: Diagnosis not present

## 2021-05-30 DIAGNOSIS — R262 Difficulty in walking, not elsewhere classified: Secondary | ICD-10-CM | POA: Diagnosis not present

## 2021-06-04 DIAGNOSIS — R42 Dizziness and giddiness: Secondary | ICD-10-CM | POA: Diagnosis not present

## 2021-06-04 DIAGNOSIS — R262 Difficulty in walking, not elsewhere classified: Secondary | ICD-10-CM | POA: Diagnosis not present

## 2021-06-06 DIAGNOSIS — R262 Difficulty in walking, not elsewhere classified: Secondary | ICD-10-CM | POA: Diagnosis not present

## 2021-06-06 DIAGNOSIS — R42 Dizziness and giddiness: Secondary | ICD-10-CM | POA: Diagnosis not present

## 2021-06-11 DIAGNOSIS — R262 Difficulty in walking, not elsewhere classified: Secondary | ICD-10-CM | POA: Diagnosis not present

## 2021-06-11 DIAGNOSIS — R42 Dizziness and giddiness: Secondary | ICD-10-CM | POA: Diagnosis not present

## 2021-06-13 DIAGNOSIS — R262 Difficulty in walking, not elsewhere classified: Secondary | ICD-10-CM | POA: Diagnosis not present

## 2021-06-13 DIAGNOSIS — R42 Dizziness and giddiness: Secondary | ICD-10-CM | POA: Diagnosis not present

## 2021-06-18 DIAGNOSIS — R42 Dizziness and giddiness: Secondary | ICD-10-CM | POA: Diagnosis not present

## 2021-06-18 DIAGNOSIS — R262 Difficulty in walking, not elsewhere classified: Secondary | ICD-10-CM | POA: Diagnosis not present

## 2021-07-02 DIAGNOSIS — E1169 Type 2 diabetes mellitus with other specified complication: Secondary | ICD-10-CM | POA: Diagnosis not present

## 2021-07-02 DIAGNOSIS — I10 Essential (primary) hypertension: Secondary | ICD-10-CM | POA: Diagnosis not present

## 2021-07-02 DIAGNOSIS — E785 Hyperlipidemia, unspecified: Secondary | ICD-10-CM | POA: Diagnosis not present

## 2021-07-30 DIAGNOSIS — L570 Actinic keratosis: Secondary | ICD-10-CM | POA: Diagnosis not present

## 2021-07-30 DIAGNOSIS — C44319 Basal cell carcinoma of skin of other parts of face: Secondary | ICD-10-CM | POA: Diagnosis not present

## 2021-07-30 DIAGNOSIS — B351 Tinea unguium: Secondary | ICD-10-CM | POA: Diagnosis not present

## 2021-08-20 DIAGNOSIS — E119 Type 2 diabetes mellitus without complications: Secondary | ICD-10-CM | POA: Diagnosis not present

## 2021-08-20 DIAGNOSIS — B351 Tinea unguium: Secondary | ICD-10-CM | POA: Diagnosis not present

## 2021-09-18 DIAGNOSIS — C44319 Basal cell carcinoma of skin of other parts of face: Secondary | ICD-10-CM | POA: Diagnosis not present

## 2022-02-18 DIAGNOSIS — B351 Tinea unguium: Secondary | ICD-10-CM | POA: Diagnosis not present

## 2022-02-18 DIAGNOSIS — E119 Type 2 diabetes mellitus without complications: Secondary | ICD-10-CM | POA: Diagnosis not present

## 2022-03-11 DIAGNOSIS — E1169 Type 2 diabetes mellitus with other specified complication: Secondary | ICD-10-CM | POA: Diagnosis not present

## 2022-03-11 DIAGNOSIS — E785 Hyperlipidemia, unspecified: Secondary | ICD-10-CM | POA: Diagnosis not present

## 2022-03-11 DIAGNOSIS — Z125 Encounter for screening for malignant neoplasm of prostate: Secondary | ICD-10-CM | POA: Diagnosis not present

## 2022-03-11 DIAGNOSIS — I10 Essential (primary) hypertension: Secondary | ICD-10-CM | POA: Diagnosis not present

## 2022-07-09 DIAGNOSIS — E1169 Type 2 diabetes mellitus with other specified complication: Secondary | ICD-10-CM | POA: Diagnosis not present

## 2022-09-02 DIAGNOSIS — E119 Type 2 diabetes mellitus without complications: Secondary | ICD-10-CM | POA: Diagnosis not present

## 2022-09-02 DIAGNOSIS — B351 Tinea unguium: Secondary | ICD-10-CM | POA: Diagnosis not present

## 2022-09-02 DIAGNOSIS — Z7984 Long term (current) use of oral hypoglycemic drugs: Secondary | ICD-10-CM | POA: Diagnosis not present

## 2022-09-10 DIAGNOSIS — Z139 Encounter for screening, unspecified: Secondary | ICD-10-CM | POA: Diagnosis not present

## 2022-09-10 DIAGNOSIS — E785 Hyperlipidemia, unspecified: Secondary | ICD-10-CM | POA: Diagnosis not present

## 2022-09-10 DIAGNOSIS — I10 Essential (primary) hypertension: Secondary | ICD-10-CM | POA: Diagnosis not present

## 2022-09-10 DIAGNOSIS — E1169 Type 2 diabetes mellitus with other specified complication: Secondary | ICD-10-CM | POA: Diagnosis not present

## 2023-03-12 DIAGNOSIS — E1169 Type 2 diabetes mellitus with other specified complication: Secondary | ICD-10-CM | POA: Diagnosis not present

## 2023-03-12 DIAGNOSIS — E785 Hyperlipidemia, unspecified: Secondary | ICD-10-CM | POA: Diagnosis not present

## 2023-03-12 DIAGNOSIS — Z125 Encounter for screening for malignant neoplasm of prostate: Secondary | ICD-10-CM | POA: Diagnosis not present

## 2023-03-12 DIAGNOSIS — H6123 Impacted cerumen, bilateral: Secondary | ICD-10-CM | POA: Diagnosis not present

## 2023-03-12 DIAGNOSIS — I10 Essential (primary) hypertension: Secondary | ICD-10-CM | POA: Diagnosis not present

## 2023-09-11 DIAGNOSIS — E1169 Type 2 diabetes mellitus with other specified complication: Secondary | ICD-10-CM | POA: Diagnosis not present

## 2023-09-11 DIAGNOSIS — E785 Hyperlipidemia, unspecified: Secondary | ICD-10-CM | POA: Diagnosis not present

## 2023-09-11 DIAGNOSIS — I10 Essential (primary) hypertension: Secondary | ICD-10-CM | POA: Diagnosis not present

## 2023-11-13 DIAGNOSIS — E119 Type 2 diabetes mellitus without complications: Secondary | ICD-10-CM | POA: Diagnosis not present

## 2023-11-13 DIAGNOSIS — M79674 Pain in right toe(s): Secondary | ICD-10-CM | POA: Diagnosis not present

## 2023-11-13 DIAGNOSIS — L84 Corns and callosities: Secondary | ICD-10-CM | POA: Diagnosis not present

## 2024-03-14 DIAGNOSIS — I10 Essential (primary) hypertension: Secondary | ICD-10-CM | POA: Diagnosis not present

## 2024-03-14 DIAGNOSIS — Z125 Encounter for screening for malignant neoplasm of prostate: Secondary | ICD-10-CM | POA: Diagnosis not present

## 2024-03-14 DIAGNOSIS — Z1389 Encounter for screening for other disorder: Secondary | ICD-10-CM | POA: Diagnosis not present

## 2024-03-14 DIAGNOSIS — E1169 Type 2 diabetes mellitus with other specified complication: Secondary | ICD-10-CM | POA: Diagnosis not present

## 2024-03-14 DIAGNOSIS — Z139 Encounter for screening, unspecified: Secondary | ICD-10-CM | POA: Diagnosis not present

## 2024-03-14 DIAGNOSIS — E785 Hyperlipidemia, unspecified: Secondary | ICD-10-CM | POA: Diagnosis not present

## 2024-03-14 DIAGNOSIS — E1165 Type 2 diabetes mellitus with hyperglycemia: Secondary | ICD-10-CM | POA: Diagnosis not present

## 2024-08-12 DIAGNOSIS — Z7984 Long term (current) use of oral hypoglycemic drugs: Secondary | ICD-10-CM | POA: Diagnosis not present

## 2024-08-12 DIAGNOSIS — L84 Corns and callosities: Secondary | ICD-10-CM | POA: Diagnosis not present

## 2024-08-12 DIAGNOSIS — L603 Nail dystrophy: Secondary | ICD-10-CM | POA: Diagnosis not present

## 2024-08-12 DIAGNOSIS — E119 Type 2 diabetes mellitus without complications: Secondary | ICD-10-CM | POA: Diagnosis not present

## 2024-09-14 DIAGNOSIS — H6123 Impacted cerumen, bilateral: Secondary | ICD-10-CM | POA: Diagnosis not present

## 2024-09-14 DIAGNOSIS — E1169 Type 2 diabetes mellitus with other specified complication: Secondary | ICD-10-CM | POA: Diagnosis not present

## 2024-09-14 DIAGNOSIS — E785 Hyperlipidemia, unspecified: Secondary | ICD-10-CM | POA: Diagnosis not present

## 2024-09-14 DIAGNOSIS — I10 Essential (primary) hypertension: Secondary | ICD-10-CM | POA: Diagnosis not present

## 2024-09-20 DIAGNOSIS — H524 Presbyopia: Secondary | ICD-10-CM | POA: Diagnosis not present

## 2024-09-20 DIAGNOSIS — E113293 Type 2 diabetes mellitus with mild nonproliferative diabetic retinopathy without macular edema, bilateral: Secondary | ICD-10-CM | POA: Diagnosis not present

## 2024-09-23 DIAGNOSIS — F0280 Dementia in other diseases classified elsewhere without behavioral disturbance: Secondary | ICD-10-CM | POA: Diagnosis not present

## 2024-09-23 DIAGNOSIS — G309 Alzheimer's disease, unspecified: Secondary | ICD-10-CM | POA: Diagnosis not present

## 2024-09-23 DIAGNOSIS — E538 Deficiency of other specified B group vitamins: Secondary | ICD-10-CM | POA: Diagnosis not present

## 2024-09-23 DIAGNOSIS — G301 Alzheimer's disease with late onset: Secondary | ICD-10-CM | POA: Diagnosis not present

## 2024-09-30 DIAGNOSIS — R4182 Altered mental status, unspecified: Secondary | ICD-10-CM | POA: Diagnosis not present

## 2024-09-30 DIAGNOSIS — F0280 Dementia in other diseases classified elsewhere without behavioral disturbance: Secondary | ICD-10-CM | POA: Diagnosis not present

## 2024-09-30 DIAGNOSIS — G309 Alzheimer's disease, unspecified: Secondary | ICD-10-CM | POA: Diagnosis not present

## 2024-10-21 DIAGNOSIS — F0150 Vascular dementia without behavioral disturbance: Secondary | ICD-10-CM | POA: Diagnosis not present

## 2024-10-21 DIAGNOSIS — I6930 Unspecified sequelae of cerebral infarction: Secondary | ICD-10-CM | POA: Diagnosis not present

## 2024-10-28 DIAGNOSIS — I6523 Occlusion and stenosis of bilateral carotid arteries: Secondary | ICD-10-CM | POA: Diagnosis not present

## 2024-10-28 DIAGNOSIS — E041 Nontoxic single thyroid nodule: Secondary | ICD-10-CM | POA: Diagnosis not present

## 2024-10-28 DIAGNOSIS — I639 Cerebral infarction, unspecified: Secondary | ICD-10-CM | POA: Diagnosis not present

## 2024-10-28 DIAGNOSIS — R0609 Other forms of dyspnea: Secondary | ICD-10-CM | POA: Diagnosis not present

## 2024-11-22 ENCOUNTER — Other Ambulatory Visit: Payer: Self-pay | Admitting: Internal Medicine

## 2024-11-22 DIAGNOSIS — E041 Nontoxic single thyroid nodule: Secondary | ICD-10-CM

## 2024-12-08 ENCOUNTER — Inpatient Hospital Stay
Admission: RE | Admit: 2024-12-08 | Discharge: 2024-12-08 | Disposition: A | Source: Ambulatory Visit | Attending: Internal Medicine | Admitting: Internal Medicine

## 2024-12-08 ENCOUNTER — Other Ambulatory Visit (HOSPITAL_COMMUNITY)
Admission: RE | Admit: 2024-12-08 | Discharge: 2024-12-08 | Disposition: A | Discharge status: Home / Self Care | Source: Ambulatory Visit | Attending: Internal Medicine | Admitting: Internal Medicine

## 2024-12-08 DIAGNOSIS — E041 Nontoxic single thyroid nodule: Secondary | ICD-10-CM | POA: Insufficient documentation

## 2024-12-12 LAB — CYTOLOGY - NON PAP

## 2024-12-20 ENCOUNTER — Encounter (HOSPITAL_COMMUNITY): Payer: Self-pay
